# Patient Record
Sex: Female | Born: 1940 | Race: White | Hispanic: No | Smoking: Current some day smoker
Health system: Southern US, Community
[De-identification: ages and names within clinical notes are randomized; demographics above are authoritative.]

## PROBLEM LIST (undated history)

## (undated) DIAGNOSIS — D649 Anemia, unspecified: Secondary | ICD-10-CM

## (undated) DIAGNOSIS — R112 Nausea with vomiting, unspecified: Secondary | ICD-10-CM

## (undated) DIAGNOSIS — J449 Chronic obstructive pulmonary disease, unspecified: Secondary | ICD-10-CM

## (undated) DIAGNOSIS — F32A Depression, unspecified: Secondary | ICD-10-CM

## (undated) DIAGNOSIS — Z951 Presence of aortocoronary bypass graft: Secondary | ICD-10-CM

## (undated) DIAGNOSIS — Z87891 Personal history of nicotine dependence: Secondary | ICD-10-CM

## (undated) DIAGNOSIS — E039 Hypothyroidism, unspecified: Secondary | ICD-10-CM

## (undated) DIAGNOSIS — R011 Cardiac murmur, unspecified: Secondary | ICD-10-CM

## (undated) DIAGNOSIS — K52839 Microscopic colitis, unspecified: Secondary | ICD-10-CM

## (undated) DIAGNOSIS — I10 Essential (primary) hypertension: Secondary | ICD-10-CM

## (undated) DIAGNOSIS — C801 Malignant (primary) neoplasm, unspecified: Secondary | ICD-10-CM

## (undated) DIAGNOSIS — S8290XA Unspecified fracture of unspecified lower leg, initial encounter for closed fracture: Secondary | ICD-10-CM

## (undated) DIAGNOSIS — Z9889 Other specified postprocedural states: Secondary | ICD-10-CM

## (undated) DIAGNOSIS — N2889 Other specified disorders of kidney and ureter: Secondary | ICD-10-CM

## (undated) DIAGNOSIS — I259 Chronic ischemic heart disease, unspecified: Secondary | ICD-10-CM

## (undated) DIAGNOSIS — I714 Abdominal aortic aneurysm, without rupture: Secondary | ICD-10-CM

## (undated) DIAGNOSIS — Z952 Presence of prosthetic heart valve: Secondary | ICD-10-CM

## (undated) DIAGNOSIS — I2510 Atherosclerotic heart disease of native coronary artery without angina pectoris: Secondary | ICD-10-CM

## (undated) DIAGNOSIS — E785 Hyperlipidemia, unspecified: Secondary | ICD-10-CM

## (undated) DIAGNOSIS — I739 Peripheral vascular disease, unspecified: Secondary | ICD-10-CM

## (undated) DIAGNOSIS — K219 Gastro-esophageal reflux disease without esophagitis: Secondary | ICD-10-CM

## (undated) DIAGNOSIS — K5790 Diverticulosis of intestine, part unspecified, without perforation or abscess without bleeding: Secondary | ICD-10-CM

## (undated) DIAGNOSIS — F329 Major depressive disorder, single episode, unspecified: Secondary | ICD-10-CM

## (undated) DIAGNOSIS — I509 Heart failure, unspecified: Secondary | ICD-10-CM

## (undated) DIAGNOSIS — E669 Obesity, unspecified: Secondary | ICD-10-CM

## (undated) DIAGNOSIS — K635 Polyp of colon: Secondary | ICD-10-CM

## (undated) HISTORY — DX: Hypothyroidism, unspecified: E03.9

## (undated) HISTORY — DX: Obesity, unspecified: E66.9

## (undated) HISTORY — DX: Essential (primary) hypertension: I10

## (undated) HISTORY — PX: CHOLECYSTECTOMY: SHX55

## (undated) HISTORY — DX: Gastro-esophageal reflux disease without esophagitis: K21.9

## (undated) HISTORY — DX: Microscopic colitis, unspecified: K52.839

## (undated) HISTORY — PX: EYE SURGERY: SHX253

## (undated) HISTORY — PX: CARPAL TUNNEL RELEASE: SHX101

## (undated) HISTORY — DX: Diverticulosis of intestine, part unspecified, without perforation or abscess without bleeding: K57.90

## (undated) HISTORY — DX: Personal history of nicotine dependence: Z87.891

## (undated) HISTORY — DX: Major depressive disorder, single episode, unspecified: F32.9

## (undated) HISTORY — DX: Depression, unspecified: F32.A

## (undated) HISTORY — DX: Abdominal aortic aneurysm, without rupture: I71.4

## (undated) HISTORY — DX: Unspecified fracture of unspecified lower leg, initial encounter for closed fracture: S82.90XA

## (undated) HISTORY — DX: Chronic ischemic heart disease, unspecified: I25.9

## (undated) HISTORY — DX: Polyp of colon: K63.5

## (undated) HISTORY — DX: Peripheral vascular disease, unspecified: I73.9

## (undated) HISTORY — DX: Hyperlipidemia, unspecified: E78.5

---

## 1983-01-11 DIAGNOSIS — Z951 Presence of aortocoronary bypass graft: Secondary | ICD-10-CM

## 1983-01-11 HISTORY — PX: CORONARY ARTERY BYPASS GRAFT: SHX141

## 1983-01-11 HISTORY — DX: Presence of aortocoronary bypass graft: Z95.1

## 1983-04-19 DIAGNOSIS — Z951 Presence of aortocoronary bypass graft: Secondary | ICD-10-CM | POA: Insufficient documentation

## 1989-01-10 HISTORY — PX: COLONOSCOPY: SHX174

## 1997-10-14 ENCOUNTER — Encounter (HOSPITAL_COMMUNITY): Admission: RE | Admit: 1997-10-14 | Discharge: 1998-01-12 | Payer: Self-pay | Admitting: *Deleted

## 1998-02-19 ENCOUNTER — Ambulatory Visit (HOSPITAL_COMMUNITY): Admission: RE | Admit: 1998-02-19 | Discharge: 1998-02-19 | Payer: Self-pay | Admitting: *Deleted

## 1998-03-04 ENCOUNTER — Other Ambulatory Visit: Admission: RE | Admit: 1998-03-04 | Discharge: 1998-03-04 | Payer: Self-pay | Admitting: *Deleted

## 1998-03-26 ENCOUNTER — Ambulatory Visit (HOSPITAL_COMMUNITY): Admission: RE | Admit: 1998-03-26 | Discharge: 1998-03-26 | Payer: Self-pay | Admitting: *Deleted

## 1998-04-01 ENCOUNTER — Encounter: Payer: Self-pay | Admitting: *Deleted

## 1998-04-01 ENCOUNTER — Ambulatory Visit (HOSPITAL_COMMUNITY): Admission: RE | Admit: 1998-04-01 | Discharge: 1998-04-01 | Payer: Self-pay | Admitting: *Deleted

## 1998-07-03 ENCOUNTER — Other Ambulatory Visit: Admission: RE | Admit: 1998-07-03 | Discharge: 1998-07-03 | Payer: Self-pay | Admitting: *Deleted

## 1998-12-16 ENCOUNTER — Other Ambulatory Visit: Admission: RE | Admit: 1998-12-16 | Discharge: 1998-12-16 | Payer: Self-pay | Admitting: *Deleted

## 1999-01-11 DIAGNOSIS — Z951 Presence of aortocoronary bypass graft: Secondary | ICD-10-CM

## 1999-01-11 HISTORY — DX: Presence of aortocoronary bypass graft: Z95.1

## 1999-02-15 ENCOUNTER — Encounter: Payer: Self-pay | Admitting: Cardiology

## 1999-02-15 ENCOUNTER — Ambulatory Visit (HOSPITAL_COMMUNITY): Admission: RE | Admit: 1999-02-15 | Discharge: 1999-02-15 | Payer: Self-pay | Admitting: Cardiology

## 1999-02-16 ENCOUNTER — Ambulatory Visit (HOSPITAL_COMMUNITY): Admission: RE | Admit: 1999-02-16 | Discharge: 1999-02-16 | Payer: Self-pay | Admitting: Cardiology

## 1999-02-16 HISTORY — PX: CARDIAC CATHETERIZATION: SHX172

## 1999-03-05 ENCOUNTER — Encounter: Payer: Self-pay | Admitting: Cardiothoracic Surgery

## 1999-03-09 ENCOUNTER — Inpatient Hospital Stay (HOSPITAL_COMMUNITY): Admission: RE | Admit: 1999-03-09 | Discharge: 1999-03-14 | Payer: Self-pay | Admitting: Cardiothoracic Surgery

## 1999-03-09 ENCOUNTER — Encounter (INDEPENDENT_AMBULATORY_CARE_PROVIDER_SITE_OTHER): Payer: Self-pay | Admitting: *Deleted

## 1999-03-09 ENCOUNTER — Encounter: Payer: Self-pay | Admitting: Cardiothoracic Surgery

## 1999-03-09 DIAGNOSIS — Z951 Presence of aortocoronary bypass graft: Secondary | ICD-10-CM | POA: Insufficient documentation

## 1999-03-09 HISTORY — PX: CORONARY ARTERY BYPASS GRAFT: SHX141

## 1999-03-10 ENCOUNTER — Encounter: Payer: Self-pay | Admitting: Cardiothoracic Surgery

## 1999-03-11 ENCOUNTER — Encounter: Payer: Self-pay | Admitting: Cardiothoracic Surgery

## 1999-03-12 ENCOUNTER — Encounter: Payer: Self-pay | Admitting: Cardiothoracic Surgery

## 1999-04-13 ENCOUNTER — Encounter (HOSPITAL_COMMUNITY): Admission: RE | Admit: 1999-04-13 | Discharge: 1999-07-12 | Payer: Self-pay | Admitting: Cardiology

## 1999-06-18 ENCOUNTER — Other Ambulatory Visit: Admission: RE | Admit: 1999-06-18 | Discharge: 1999-06-18 | Payer: Self-pay | Admitting: *Deleted

## 1999-10-19 ENCOUNTER — Encounter: Admission: RE | Admit: 1999-10-19 | Discharge: 1999-11-22 | Payer: Self-pay | Admitting: Orthopaedic Surgery

## 1999-12-15 ENCOUNTER — Other Ambulatory Visit: Admission: RE | Admit: 1999-12-15 | Discharge: 1999-12-15 | Payer: Self-pay | Admitting: *Deleted

## 2000-06-14 ENCOUNTER — Ambulatory Visit (HOSPITAL_COMMUNITY): Admission: RE | Admit: 2000-06-14 | Discharge: 2000-06-14 | Payer: Self-pay | Admitting: Orthopedic Surgery

## 2000-06-14 ENCOUNTER — Encounter: Payer: Self-pay | Admitting: Orthopedic Surgery

## 2000-12-20 ENCOUNTER — Other Ambulatory Visit: Admission: RE | Admit: 2000-12-20 | Discharge: 2000-12-20 | Payer: Self-pay | Admitting: *Deleted

## 2002-01-10 HISTORY — PX: GASTRIC BYPASS: SHX52

## 2002-03-14 ENCOUNTER — Other Ambulatory Visit: Admission: RE | Admit: 2002-03-14 | Discharge: 2002-03-14 | Payer: Self-pay | Admitting: *Deleted

## 2002-06-06 ENCOUNTER — Encounter: Admission: RE | Admit: 2002-06-06 | Discharge: 2002-09-04 | Payer: Self-pay | Admitting: *Deleted

## 2002-06-14 ENCOUNTER — Ambulatory Visit (HOSPITAL_COMMUNITY): Admission: RE | Admit: 2002-06-14 | Discharge: 2002-06-14 | Payer: Self-pay | Admitting: *Deleted

## 2002-06-14 ENCOUNTER — Encounter: Payer: Self-pay | Admitting: Cardiology

## 2002-06-21 ENCOUNTER — Encounter (INDEPENDENT_AMBULATORY_CARE_PROVIDER_SITE_OTHER): Payer: Self-pay | Admitting: *Deleted

## 2002-06-21 ENCOUNTER — Encounter: Admission: RE | Admit: 2002-06-21 | Discharge: 2002-06-21 | Payer: Self-pay | Admitting: *Deleted

## 2002-09-02 ENCOUNTER — Encounter: Payer: Self-pay | Admitting: Internal Medicine

## 2002-09-02 ENCOUNTER — Encounter (INDEPENDENT_AMBULATORY_CARE_PROVIDER_SITE_OTHER): Payer: Self-pay

## 2002-09-02 ENCOUNTER — Inpatient Hospital Stay (HOSPITAL_COMMUNITY): Admission: RE | Admit: 2002-09-02 | Discharge: 2002-09-04 | Payer: Self-pay | Admitting: *Deleted

## 2002-09-02 ENCOUNTER — Encounter (INDEPENDENT_AMBULATORY_CARE_PROVIDER_SITE_OTHER): Payer: Self-pay | Admitting: *Deleted

## 2002-09-03 ENCOUNTER — Encounter (INDEPENDENT_AMBULATORY_CARE_PROVIDER_SITE_OTHER): Payer: Self-pay | Admitting: *Deleted

## 2002-09-17 ENCOUNTER — Encounter: Admission: RE | Admit: 2002-09-17 | Discharge: 2002-12-16 | Payer: Self-pay | Admitting: *Deleted

## 2003-02-11 ENCOUNTER — Encounter: Admission: RE | Admit: 2003-02-11 | Discharge: 2003-05-12 | Payer: Self-pay | Admitting: *Deleted

## 2003-07-24 ENCOUNTER — Other Ambulatory Visit: Admission: RE | Admit: 2003-07-24 | Discharge: 2003-07-24 | Payer: Self-pay | Admitting: *Deleted

## 2004-06-21 ENCOUNTER — Encounter: Admission: RE | Admit: 2004-06-21 | Discharge: 2004-09-19 | Payer: Self-pay | Admitting: *Deleted

## 2004-09-17 ENCOUNTER — Other Ambulatory Visit: Admission: RE | Admit: 2004-09-17 | Discharge: 2004-09-17 | Payer: Self-pay | Admitting: *Deleted

## 2006-05-18 ENCOUNTER — Ambulatory Visit (HOSPITAL_COMMUNITY): Admission: RE | Admit: 2006-05-18 | Discharge: 2006-05-18 | Payer: Self-pay | Admitting: Cardiology

## 2006-10-16 ENCOUNTER — Encounter: Payer: Self-pay | Admitting: Internal Medicine

## 2006-10-16 ENCOUNTER — Ambulatory Visit (HOSPITAL_COMMUNITY): Admission: RE | Admit: 2006-10-16 | Discharge: 2006-10-16 | Payer: Self-pay | Admitting: Cardiology

## 2006-10-20 ENCOUNTER — Inpatient Hospital Stay (HOSPITAL_BASED_OUTPATIENT_CLINIC_OR_DEPARTMENT_OTHER): Admission: RE | Admit: 2006-10-20 | Discharge: 2006-10-20 | Payer: Self-pay | Admitting: Cardiology

## 2006-10-20 HISTORY — PX: CARDIAC CATHETERIZATION: SHX172

## 2006-11-14 ENCOUNTER — Encounter (HOSPITAL_COMMUNITY): Admission: RE | Admit: 2006-11-14 | Discharge: 2007-01-09 | Payer: Self-pay | Admitting: Cardiology

## 2007-01-11 ENCOUNTER — Encounter (HOSPITAL_COMMUNITY): Admission: RE | Admit: 2007-01-11 | Discharge: 2007-04-05 | Payer: Self-pay | Admitting: Cardiology

## 2007-04-11 ENCOUNTER — Encounter (HOSPITAL_COMMUNITY): Admission: RE | Admit: 2007-04-11 | Discharge: 2007-07-10 | Payer: Self-pay | Admitting: Cardiology

## 2007-05-16 ENCOUNTER — Other Ambulatory Visit: Admission: RE | Admit: 2007-05-16 | Discharge: 2007-05-16 | Payer: Self-pay | Admitting: Obstetrics and Gynecology

## 2007-06-01 ENCOUNTER — Ambulatory Visit (HOSPITAL_COMMUNITY): Admission: RE | Admit: 2007-06-01 | Discharge: 2007-06-01 | Payer: Self-pay | Admitting: Obstetrics & Gynecology

## 2007-06-20 ENCOUNTER — Encounter (INDEPENDENT_AMBULATORY_CARE_PROVIDER_SITE_OTHER): Payer: Self-pay | Admitting: *Deleted

## 2007-06-20 ENCOUNTER — Ambulatory Visit: Payer: Self-pay | Admitting: Internal Medicine

## 2007-07-09 ENCOUNTER — Ambulatory Visit (HOSPITAL_COMMUNITY): Admission: RE | Admit: 2007-07-09 | Discharge: 2007-07-09 | Payer: Self-pay | Admitting: Cardiology

## 2007-07-11 ENCOUNTER — Encounter (HOSPITAL_COMMUNITY): Admission: RE | Admit: 2007-07-11 | Discharge: 2007-10-09 | Payer: Self-pay | Admitting: Cardiology

## 2007-07-12 ENCOUNTER — Ambulatory Visit: Payer: Self-pay | Admitting: Internal Medicine

## 2007-07-12 HISTORY — PX: COLONOSCOPY: SHX174

## 2007-08-22 ENCOUNTER — Ambulatory Visit (HOSPITAL_COMMUNITY): Admission: RE | Admit: 2007-08-22 | Discharge: 2007-08-22 | Payer: Self-pay | Admitting: Internal Medicine

## 2007-10-11 ENCOUNTER — Encounter (HOSPITAL_COMMUNITY): Admission: RE | Admit: 2007-10-11 | Discharge: 2008-01-09 | Payer: Self-pay | Admitting: Cardiology

## 2007-10-24 ENCOUNTER — Emergency Department (HOSPITAL_COMMUNITY): Admission: EM | Admit: 2007-10-24 | Discharge: 2007-10-24 | Payer: Self-pay | Admitting: Family Medicine

## 2007-11-07 ENCOUNTER — Telehealth: Payer: Self-pay | Admitting: Internal Medicine

## 2007-12-11 ENCOUNTER — Telehealth: Payer: Self-pay | Admitting: Internal Medicine

## 2007-12-12 DIAGNOSIS — R1013 Epigastric pain: Secondary | ICD-10-CM

## 2007-12-12 DIAGNOSIS — R110 Nausea: Secondary | ICD-10-CM

## 2007-12-17 ENCOUNTER — Ambulatory Visit (HOSPITAL_COMMUNITY): Admission: RE | Admit: 2007-12-17 | Discharge: 2007-12-17 | Payer: Self-pay | Admitting: Internal Medicine

## 2007-12-26 ENCOUNTER — Telehealth: Payer: Self-pay | Admitting: Internal Medicine

## 2007-12-28 ENCOUNTER — Other Ambulatory Visit: Payer: Self-pay | Admitting: Internal Medicine

## 2007-12-28 ENCOUNTER — Ambulatory Visit (HOSPITAL_COMMUNITY): Admission: RE | Admit: 2007-12-28 | Discharge: 2007-12-28 | Payer: Self-pay | Admitting: Cardiology

## 2007-12-28 ENCOUNTER — Encounter: Payer: Self-pay | Admitting: Internal Medicine

## 2007-12-28 DIAGNOSIS — I714 Abdominal aortic aneurysm, without rupture: Secondary | ICD-10-CM

## 2007-12-28 DIAGNOSIS — Z862 Personal history of diseases of the blood and blood-forming organs and certain disorders involving the immune mechanism: Secondary | ICD-10-CM | POA: Insufficient documentation

## 2007-12-28 DIAGNOSIS — K7689 Other specified diseases of liver: Secondary | ICD-10-CM | POA: Insufficient documentation

## 2007-12-28 DIAGNOSIS — K5730 Diverticulosis of large intestine without perforation or abscess without bleeding: Secondary | ICD-10-CM | POA: Insufficient documentation

## 2007-12-31 ENCOUNTER — Ambulatory Visit: Payer: Self-pay | Admitting: Internal Medicine

## 2008-01-11 ENCOUNTER — Encounter (HOSPITAL_COMMUNITY): Admission: RE | Admit: 2008-01-11 | Discharge: 2008-04-10 | Payer: Self-pay | Admitting: Cardiology

## 2008-04-11 ENCOUNTER — Encounter (HOSPITAL_COMMUNITY): Admission: RE | Admit: 2008-04-11 | Discharge: 2008-07-10 | Payer: Self-pay | Admitting: Cardiology

## 2009-01-28 ENCOUNTER — Ambulatory Visit (HOSPITAL_COMMUNITY): Admission: RE | Admit: 2009-01-28 | Discharge: 2009-01-28 | Payer: Self-pay | Admitting: Cardiology

## 2009-02-03 ENCOUNTER — Encounter: Admission: RE | Admit: 2009-02-03 | Discharge: 2009-02-03 | Payer: Self-pay | Admitting: Cardiology

## 2009-08-31 ENCOUNTER — Ambulatory Visit: Payer: Self-pay | Admitting: Cardiology

## 2010-02-10 ENCOUNTER — Other Ambulatory Visit: Payer: Self-pay | Admitting: Cardiology

## 2010-02-10 DIAGNOSIS — I714 Abdominal aortic aneurysm, without rupture, unspecified: Secondary | ICD-10-CM

## 2010-02-10 HISTORY — DX: Abdominal aortic aneurysm, without rupture, unspecified: I71.40

## 2010-02-10 HISTORY — DX: Abdominal aortic aneurysm, without rupture: I71.4

## 2010-02-19 ENCOUNTER — Ambulatory Visit
Admission: RE | Admit: 2010-02-19 | Discharge: 2010-02-19 | Disposition: A | Discharge status: Home / Self Care | Payer: MEDICARE | Source: Ambulatory Visit | Attending: Cardiology | Admitting: Cardiology

## 2010-02-19 DIAGNOSIS — I714 Abdominal aortic aneurysm, without rupture: Secondary | ICD-10-CM

## 2010-03-05 ENCOUNTER — Ambulatory Visit (INDEPENDENT_AMBULATORY_CARE_PROVIDER_SITE_OTHER): Payer: MEDICARE | Admitting: Cardiology

## 2010-03-05 DIAGNOSIS — E7800 Pure hypercholesterolemia, unspecified: Secondary | ICD-10-CM

## 2010-03-05 DIAGNOSIS — I2510 Atherosclerotic heart disease of native coronary artery without angina pectoris: Secondary | ICD-10-CM

## 2010-05-25 NOTE — H&P (Signed)
NAMECAMILIA, [name]                 ACCOUNT NO.:  [id number]   MEDICAL RECORD NO.:  [id number]          PATIENT TYPE:  OUT   LOCATION:  MLAB                         FACILITY:  [facility]   PHYSICIAN:  Colleen Can. Deborah Chalk, M.D.DATE OF BIRTH:  1940/05/19   DATE OF ADMISSION:  10/16/2006  DATE OF DISCHARGE:                              HISTORY & PHYSICAL   CHIEF COMPLAINT:  None.   HISTORY OF PRESENT ILLNESS:  [name] is a 70 year old white female who is  referred for diagnostic cardiac catheterization.  She was seen in our  office towards the middle part of September after an approximate 2-year  absence.  In that timeframe, she has basically done well.  She has had  no recurrence of chest pain.  Unfortunately, she has gained back a  considerable amount of weight and is not exercising.  She has been very  depressed and stressed out about multiple issues in her life.  She was  subsequently referred for outpatient Cardiolite study which was  performed on October 10, 2006.  She underwent a two-day adenosine  Cardiolite study which demonstrated anterior ischemia with normal LV  function.  Her ejection fraction was 58%.  She is now referred for  diagnostic cardiac catheterization.   PAST MEDICAL HISTORY:  1. Known ischemic heart disease.  She had original coronary artery      bypass grafting in 1985 with left internal mammary to the LAD, vein      graft sequentially to the obtuse marginals 1 and 2, and a vein      graft to the right coronary per Dr. Particia Lather.  She underwent      a redo coronary artery bypass grafting in February of 2001 per Dr.      Sheliah Plane with a free right internal mammary to the distal      LAD, sequential vein graft to the first and second OMs.  2. Obesity.  3. Previous history of bariatric surgery.  4. History of fractured right leg.  5. History of carpal tunnel surgery.  6. Hypertension.  7. Hyperlipidemia.  She is felt to be statin intolerant due to a  past      history of increased LFTs.  8. Depression.   ALLERGIES:  NONE.   CURRENT MEDICATIONS:  1. Celexa 40 mg a day.  2. Synthroid 50 mcg a day.  3. Atenolol 50 mg a day.  4. Aspirin daily.  5. Zetia 10 mg a day.  6. Fish oil daily.  7. Vitamin D daily.   FAMILY HISTORY:  Unremarkable for CAD.   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  She has no alcohol or tobacco use.   REVIEW OF SYSTEMS:  As noted above and is otherwise unremarkable.   PHYSICAL EXAMINATION:  GENERAL:  She is a very pleasant white female.  She is in no acute distress.  VITAL SIGNS:  Her weight is 244.5 pounds, blood pressure 138/80 sitting,  128/80 standing, heart rate 60 and regular, respirations 18, afebrile.  SKIN:  Warm and dry.  Color is unremarkable.  LUNGS:  Clear.  HEART:  Regular rhythm.  ABDOMEN:  Obese.  EXTREMITIES:  Without edema.  NEUROLOGIC:  No gross focal deficits.   PERTINENT LABORATORY DATA:  Pending.   IMPRESSION:  1. Abnormal adenosine Cardiolite study.  2. Known history of ischemic heart disease with original coronary      artery bypass grafting in 1985 and subsequent redo surgery in 2001.  3. Obesity.  4. Depression.  5. Hypothyroidism.  6. History of bariatric surgery.  7. Hyperlipidemia, felt to be statin intolerant.   PLAN:  Will proceed on with diagnostic cardiac catheterization.  The  procedure risks and benefits have all been explained, and she is willing  to proceed on Friday, October 20, 2006.      Sharlee Blew, N.P.      Colleen Can. Deborah Chalk, M.D.  Electronically Signed    LC/MEDQ  D:  10/16/2006  T:  10/16/2006  Job:  [id number]   cc:   Colleen Can. Deborah Chalk, M.D.

## 2010-05-25 NOTE — Cardiovascular Report (Signed)
NAMECATTIE, [name]                 ACCOUNT NO.:  [id number]   MEDICAL RECORD NO.:  [id number]          PATIENT TYPE:  OIB   LOCATION:  1961                         FACILITY:  [facility]   PHYSICIAN:  Colleen Can. Deborah Chalk, M.D.DATE OF BIRTH:  1940-06-06   DATE OF PROCEDURE:  10/20/2006  DATE OF DISCHARGE:                            CARDIAC CATHETERIZATION   PROCEDURE:  Left heart catheterization with left coronary angiography,  left ventricular angiography, angiography of the right internal mammary  artery free graft, angiography of the saphenous vein graft, angiography  of the left internal mammary graft.   TYPE AND SITE OF ENTRY:  Percutaneous right femoral artery.   CATHETERS:  4-French 4 curved catheters, 4-French JL-4 left coronary  catheter, 4-French pigtail ventriculographic catheter, Williams right  catheter, left internal mammary graft catheter, and a right 3D catheter  to engage the left subclavian.   CONTRAST MATERIAL:  Omnipaque.   COMMENTS:  The patient tolerated the procedure well.   HEMODYNAMIC DATA:  The aortic pressure is 140/72, LV was 157/12-20.  There was no aortic valve gradient noted on pullback.   ANGIOGRAPHIC DATA:  Left ventricular angiogram was performed in the RAO  position.  Overall cardiac size and silhouette were essentially normal.  There is mild inferior hypokinesis but overall the inferior wall and  anterior apical wall moved reasonably well.  The global ejection  fraction would be in the 55% range.  There was no mitral regurgitation,  intracardiac calcification or intracavitary filling defect.   CORONARY ARTERIES.:  1. Left internal mammary graft to LAD is patent.  There is flow to a      small bifurcating diagonal system proximally.  There is a total      occlusion of the left anterior descending distally.  2. The diagonal vessel is small.  3. Left main coronary artery is proximally occluded.  4. Saphenous vein graft to obtuse marginal one and  obtuse marginal two      is widely patent with excellent flow.  These are reasonably large      vessels and give collaterals to the inferior wall.  5. Right coronary artery:  The right coronary artery is totally      occluded proximally.  There are collaterals by way of the conus      branch to the left system.  6. The right internal mammary graft to the distal LAD is widely      patent.  It has excellent insertion, good distal runoff.  There are      numerous collaterals to the right distribution, to the anterior RV      branches, and what appears to be septal branches as well.  7. The native right coronary artery is totally occluded.   OVERALL IMPRESSION:  1. Totally occluded native coronary circulation.  2. Persistent patency of the right internal mammary graft and the      distal left anterior descending artery with persistent patency of      the left internal mammary graft to the proximal left anterior      descending artery with persistent patency  of the saphenous vein      graft to the sequential branches of the obtuse marginal.   DISCUSSION:  While [name] has extensive coronary atherosclerosis, it  appears that she does have satisfactory revascularization at this point  in time.  Will continue medical management.  There are no lesions  present that would be amenable to angioplasty or redo surgery at this  point time.      Colleen Can. Deborah Chalk, M.D.  Electronically Signed     SNT/MEDQ  D:  10/20/2006  T:  10/20/2006  Job:  [id number]

## 2010-05-28 NOTE — Op Note (Signed)
   [name], [name]                           ACCOUNT NO.:  [id number]   MEDICAL RECORD NO.:  [id number]                   PATIENT TYPE:  INP   LOCATION:  X001                                 FACILITY:  [facility]   PHYSICIAN:  Sharlet Salina T. Hoxworth, M.D.          DATE OF BIRTH:  1940-08-18   DATE OF PROCEDURE:  09/02/2002  DATE OF DISCHARGE:                                 OPERATIVE REPORT   PROCEDURE:  Intraoperative upper endoscopy.   DESCRIPTION OF PROCEDURE:  Upper endoscopy is performed in this patient at  the completion of roux-Y gastric bypass. The video endoscope was passed  through the esophagus and then advanced under direct vision to the EG  junction and the small gastric pouch entered. The anastomosis was inspected  and appeared patent and intact. Photos were taken. There was no bleeding  from the staple lines. The pouch was inflated and suture lines were  inspected under saline in the abdomen and there was no leak. The pouch was  measured at approximately 4 cm in length. Following this, air was aspirated  and the scope was withdrawn.                                               Lorne Skeens. Hoxworth, M.D.    [name]  D:  09/02/2002  T:  09/02/2002  Job:  [id number]

## 2010-05-28 NOTE — Cardiovascular Report (Signed)
[place]  Patient:    ARIONNE IAMS                         MRN: [id number] Proc. Date: 02/16/99 Adm. Date:  [id number] Attending:  Eleanora Neighbor                        Cardiac Catheterization  PROCEDURES PERFORMED:  Left heart catheterization with selective coronary angiography, left ventricular angiography (RAO and LAO views), angiography of left internal mammary artery, and angiography of saphenous vein grafts.  TYPE AND SITE OF ENTRY:  Percutaneous right femoral artery.  CATHETERS:  Six [place] #4 curved Judkins right and left coronary catheters, 6-French pigtail ventriculography catheter.  CONTRAST MATERIAL:  Omnipaque.  MEDICATIONS GIVEN PRIOR TO PROCEDURE:  Valium 10 mg p.o.  MEDICATIONS GIVEN DURING PROCEDURE:  Versed 5 mg IV.  COMMENTS: Perclose was used.  The patient tolerated the procedure well.  RESULTS:  HEMODYNAMIC DATA:  The aortic pressure was 127/69.  LV pressure was 127/19. There was no aortic valve gradient noted on pullback.  ANGIOGRAPHIC DATA:  LEFT VENTRICULOGRAPHY:  Left ventricular angiogram was performed in the RAO and LAO projections.  There was a rather discrete area of inferolateral hypokinesis. The global ejection fraction would be 57%.  This area most likely corresponds to the distal left circumflex distribution.  There was no mitral regurgitation, intracardiac calcification, or intracavitary filling defect.  CORONARY ARTERIES:  The coronary arteries arise and distribute normally.  1. The left main coronary artery is essentially normal.  2. The left circumflex is totally occluded approximately 2 cm from its origin.   3. The left anterior descending artery develops a tubular 70% narrowing before  large septal perforating branch.  The LAD is occluded after the septal    perforator.  There is 70% to 80% narrowing at this junction of the first    septal perforator.  The first septal perforator  supplies collaterals to the    distal right coronary artery bed on the inferior wall.  The first septal    perforator is relatively large.  4. The right coronary artery is totally occluded approximately 3 cm from its    origin.  There is an atrial circumflex collateral that supplies collaterals o    the distal left circumflex bed.  SAPHENOUS VEIN GRAFTS: 1. The saphenous vein graft to the circumflex distribution (OM-1 and OM-2) has  30% to 40% narrowing in the body of the graft in the focal place before its    insertion into the first obtuse marginal.  This insertion site is satisfactory    and there is good flow into the first obtuse marginal and retrograde flow into    a second obtuse marginal.  The limb of the graft continues onto the distal    posterolateral branch and appears to have some degree of thrombus and grudge  in it.  There is some faint antegrade fill to the distal marginal systems.    There is some slight retrograde filling of this distal marginal system, but hat    appears to be the location of the ischemic event and probably created the recent    change in symptoms.  2. The saphenous vein graft to the distal right coronary artery was looked for    extensively and is presumed to have occluded with a small nub demonstrated that    the catheter would hang-up  in.  3. The left internal mammary artery to the LAD is widely patent, with nice body to    the graft and nice insertion.  The graft gives retrograde fill to a diagonal    and also supplies collaterals to the distal right coronary artery bed. There    appears to be a 90% focal stenosis after an area of segmental disease and after    the insertion into the LAD.  This area is potentially an area that could be    treated with angioplasty, although it would be complicated.  In view of the    cines from eight years ago, there has not been a dramatic change in this area of    stenosis.  There is diffuse distal  disease in the LAD beyond this point.  IMPRESSIONS: 1. Reasonably well preserved global left ventricular function with inferolateral    hypokinesis. 2. Totally occluded native circulation. 3. Totally occluded saphenous vein graft to the right coronary artery and an    occluded sequential limb of the saphenous vein graft to the distal left    circumflex with patent proximal limb of the sequential graft to the circumflex    system and patent left internal mammary artery. 4. Extensive and diffuse collateral system with a residual severe stenosis in the    left anterior descending artery distal to the insertion of the left internal    mammary artery graft.  DISCUSSION:  Initially, we will try to treat Jazira with medication.  We will obtain a surgical consultation, as well as a consultation for consideration of possible distal angioplasty.  However, with the extent of distal disease, it is felt that that needs to be considered with caution. DD:  02/16/99 TD:  02/16/99 Job: [id number]

## 2010-05-28 NOTE — H&P (Signed)
[place]  Patient:    [name], [name]                        MRN: [id number] Adm. Date:  [id number] Disc. Date: [id number] Attending:  Eleanora Neighbor Dictator:   Jennet Maduro. Earl Gala, R.N., A.N.P. CC:         Colleen Can. Deborah Chalk, M.D.                         History and Physical  CHIEF COMPLAINT:  Chest pain.  HISTORY OF PRESENT ILLNESS:  [name] is a 70 year old white female who has known atherosclerotic cardiovascular disease.  She has had previous coronary artery bypass grafting dating back to 1985; her last catheterization occurred in December of 1993, at which time she demonstrated normal left ventricular function with patent saphenous vein grafts x 3, with patent left internal mammary artery. There was a totally occluded native coronary artery x 3 with progression of small vessel and distal disease.  She presented to our office on February 09, 1999 as a work-in appointment with recurrent episodes of chest discomfort; it had been occurring or the past one to two weeks prior.  She notes that she had recurrent left arm discomfort as well as mid-substernal chest discomfort that had been somewhat exertional in nature, yet at times, she had had left arm discomfort noted with movement of the left arm as well as with actual palpation of the left arm.  She  tried some nitroglycerin over the previous weekend; however, it was 70 years old and she basically had no relief.  Her symptoms do resolve with rest.  We proceeded one with adenosine Cardiolite, two-day study, last week, which is abnormal. Ejection fraction is in the mid-50s and in light of these findings, we will now  proceed on with repeat cardiac catheterization.  PAST MEDICAL HISTORY: 1. ASCVD with a history of coronary artery bypass grafting in 1985 with left    internal mammary to the LAD, saphenous vein graft sequential to the obtuse    marginals #1 and #2 and a saphenous vein  graft to the right coronary per    Dr. Delsa Grana. Wilson, with last catheterization in 1993 with results as    described above. 2. Obesity. 3. Prior history of a fractured right leg. 4. History of carpal tunnel surgery. 5. Hypertension. 6. Hypercholesterolemia, currently on Lipitor.  ALLERGIES:  None known.  CURRENT MEDICINES: 1. Lipitor 20 mg a day. 2. Celexa 20 mg a day. 3. Norvasc 5 mg a day. 4. Synthroid 0.05 mg daily. 5. Tenormin 50 mg daily. 6. Imdur 60 mg daily.  FAMILY HISTORY:  Unremarkable for CAD.  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  There is no smoking and no alcohol use.  REVIEW OF SYSTEMS:  Otherwise as noted above.  PHYSICAL EXAMINATION:  GENERAL:  She is a very pleasant female, currently in no acute distress.  VITAL SIGNS:  Blood pressure 130/90, sitting and standing.  Heart rate is 90 and regular.  SKIN:  Warm and dry.  Color is unremarkable.  NECK:  Supple.  There is no JVD.  LUNGS:  Clear to auscultation.  CARDIAC:  Regular rhythm.  ABDOMEN:  Obese.  She has soft positive bowel sounds.  EXTREMITIES:  Extremities are full yet there is no overt edema.  NEUROLOGIC:  Intact  with no gross focal deficits.  LABORATORY DATA:  EKG originally performed at the hospital shows normal sinus rhythm with nonspecific ST and T wave changes.  Other labs are currently pending as well as chest x-ray.  OVERALL IMPRESSION:  Recurrent chest pain in the setting of an abnormal two-day  adenosine Cardiolite, with known atherosclerotic cardiovascular disease and previous history of coronary artery bypass grafting 15 years ago.  PLAN:  In light of these findings, will proceed on with repeat coronary angiography.  The risks, procedure and benefits have been explained, which include the risks of bleeding, bruising, blood clot or allergy to the x-ray dye, as well as arrhythmia, stroke, heart  attack and even the possibility of death was discussed and the patient is willing to proceed. DD:  02/15/99 TD:  02/15/99 Job: [id number]

## 2010-05-28 NOTE — Op Note (Signed)
. [facility]  Patient:    [name], [name]                        MRN: [id number] Proc. Date: 03/09/99 Adm. Date:  [id number] Attending:  Waldo Laine CC:         Colleen Can. Deborah Chalk, M.D.                           Operative Report  PREOPERATIVE DIAGNOSIS:  Recurrent coronary occlusive disease.  POSTOPERATIVE DIAGNOSIS:  Recurrent coronary occlusive disease.  SURGICAL PROCEDURE:  Redo coronary artery bypass grafting x 3 with the free right internal mammary to the distal left anterior descending coronary artery, sequential reverse saphenous vein graft to the first and second obtuse marginal coronary arteries.  SURGEON:  Gwenith Daily. Tyrone Sage, M.D.  FIRST ASSISTANT:  Carlye Grippe.  BRIEF HISTORY:  The patient is a 70 year old female who had previously undergone coronary artery bypass grafting 18 years prior.  She had done well until recently when she began having increasing anginal pain including one prolonged episode involving a myocardial infarction of the lateral wall.  Because of persistent symptoms, the patient sought medical attention.  A cardiac catheterization was performed by Dr. Deborah Chalk which demonstrated significant three-vessel disease including total occlusion of the right coronary artery with a very small distal  vessel.  A vein graft to this area was occluded.  The patient had a patent vein  graft to the first obtuse marginal with a sequential limb of this graft to the second obtuse marginal that appeared to be freshly closed.  In addition, there s a high grade, greater than 80%, stenosis of the LAD distal to the insertion of a large patent mammary graft to the LAD.  This supplied two diagonal branches also. Overall ventricular function was preserved.  Because of the patients persistent  symptoms, redo coronary artery bypass grafting was recommended.  This was with knowledge that the previous operation 18 years  before, difficulty performing with performing the right graft was noted because of the small size of the vessel and also because of diffuse disease in the LAD.  DESCRIPTION OF PROCEDURE:  With Swan-Ganz and arterial line monitors in place, he patient underwent general endotracheal anesthesia.  Consideration for endovein harvesting was entertained, but neither in the right nor the left thigh could the vein be located at the knee.  For this reason, an incision was made in the left  groin, and a segment of left thigh vein was dissected down.  A mediastinotomy was performed with a saggital saw taking care not to injure underlying tissues. The right internal mammary artery was dissected down with some difficulty and with se of a ______ because of the patients position on the table, and dissection down f the mammary artery was difficult; however, a satisfactory mammary graft was obtained and taken completely down to use as a free graft.  The right ventricle and right atrium and ascending aorta were then dissected out to allow cannulation. The patient was systemically heparinized in the ascending aorta in the right atrium and cannulated in the aortic root.  Then a cardioplegic needle was introduced into he ascending aorta.  The patient was placed on cardiopulmonary bypass at 2.4 liters/minute/sq m, and the remainder of the left ventricle was dissected out. The mammary artery was identified and care was taken not to injure it.  With as  much as the heart dissected as possible, the patients body temperature was cooled to 30 degrees.  An aortic cross-clamp was applied.  Cold blood potassium cardioplegia, 500 cc, was administered initially antegrade and additional plegia was administered retrograde.  A bulldog clamp was placed on the patent left graft to the LAD with some difficulty; however, it was elevated.  The remainder of the posterior wall and lateral wall of the heart was  dissected out.  The previous graft was identified.  The first obtuse marginal coronary artery was opened, and then the second obtuse marginal coronary artery was opened.   Using a short natural wire on the vein graft, anastomosis was performed to the first obtuse marginal coronary  artery.  This vessel was diffusely diseased and admitted a 1.5-mm probe.  In a similar fashion, the distal extent of the same vein was then carried on to the second obtuse marginal where a distal anastomosis was performed with a running -0 Prolene.  Additional cold blood cardioplegia is administered down the vein graft. Attention was then turned to the LAD which was a very diffusely diseased vessel, and the mid portion of the vessel was opened.  There was diffuse plaque throughout the wall and in order to be able to perform anastomosis, an endarterectomy of the distal LAD was performed.  Using a running 8-0 Prolene, the free right internal  mammary graft was anastomosed to the left anterior descending coronary artery. The aortic cross-clamp was then removed with a total cross-clamp time of 85 minutes. The patient spontaneously converted to a sinus rhythm.  A partial occlusion clamp was placed on the ascending aorta.   A single-punch aortotomy was performed in he vein graft to the first and second obtuse marginal coronary arteries was carried out.  The right internal mammary graft was then anastomosed to the hood of the ld vein graft to the obtuse marginal grafts.  Air was evacuated from the grafts, and the partial occlusion clamp was removed.  The sites of anastomosis were inspected and were free of bleeding.  The patient was then ventilated and weaned from cardiopulmonary bypass.  On low-dose dopamine, she remained hemodynamically stable and was decannulated in the usual fashion.  Protamine sulfate was administered.  With the operative field hemostatic, two atrial and two ventricular  pacing wires were applied.  ______ was applied.  A right pleural tube and two mediastinal tubes were left in place.  The mediastinal fat was covered over the ascending aorta. The sternum was closed with #6 stainless steel wire.  The fascia was closed with  interrupted #0 Vicryl and running 3-0 Vicryl subcutaneous stitches for subcuticular stitching of skin edges.  Dry dressings were applied.  Sponge and needle count as reported as correct at the completion of the procedure.  The patient tolerated he procedure without obvious complications and was transferred to the surgical intensive care unit for further postoperative care. DD:  03/10/99 TD:  03/10/99 Job: [id number]

## 2010-05-28 NOTE — Op Note (Signed)
[name], [name]                           ACCOUNT NO.:  [id number]   MEDICAL RECORD NO.:  [id number]                   PATIENT TYPE:  INP   LOCATION:  0154                                 FACILITY:  [facility]   PHYSICIAN:  Vikki Ports, M.D.         DATE OF BIRTH:  11/13/1940   DATE OF PROCEDURE:  09/02/2002  DATE OF DISCHARGE:                                 OPERATIVE REPORT   PREOPERATIVE DIAGNOSES:  1. Morbid obesity.  2. Cholelithiasis.   POSTOPERATIVE DIAGNOSES:  1. Morbid obesity.  2. Cholelithiasis.   PROCEDURES:  1. Laparoscopic Roux-en-Y gastric bypass.  2. Laparoscopic cholecystectomy.   SURGEON:  Vikki Ports, M.D.   ASSISTANT:  Sharlet Salina T. Hoxworth, M.D.   ANESTHESIA:  General.   DESCRIPTION OF PROCEDURE:  The patient was taken to the operating room and  placed in a supine position.  After adequate general anesthesia was induced  using endotracheal tube, the abdomen was prepped and draped in the normal  sterile fashion.  Using an oblique left subcostal 1 cm incision and the  OptiView, a 12 mm trocar was placed in the abdomen.  This was accomplished  under direct visualization.  Pneumoperitoneum was obtained by using  continuous flow carbon dioxide to a pressure of 17 mmHg.  Under direct  visualization, two 12 mm ports were placed in the right upper quadrant, one  in the left mid abdomen, and an additional port and the 5 mm port in the  left abdomen.  The abdomen was inspected, and no gross abnormalities were  seen.  Ligament of Treitz was identified and 40 cm distal to the ligament,  the bowel was divided using a 60 mm autosuture white load linear cutter.  The distal limb was marked with a Penrose drain.  An additional 100 cm was  then counted distal to the division, and a side-to-side jejunojejunostomy  was accomplished using the harmonic scalpel for enterotomies and a 60 mm  white load autosuture linear cutter.  The defect was closed with  running 2-0  Vicryl sutures.  It was reinforced with Tisseel.  The mesentery which had  previously been divided with the harmonic scalpel, easily mobilized the Roux-  limb to reach the left lateral segment of the liver.   Nathanson retractor was placed in the upper abdomen, and the liver was  retracted.  There was a small capsular tear to the liver.  This was  controlled with Surgicel.  The angle of His was identified and was dissected  away from the left crus of the diaphragm.  The lesser sac was entered.  An  area on the lesser curve was then identified between the first and second  vessels, measuring about 5 cm distal to the esophagogastric junction.  After  all tubes were removed from the stomach, the lesser sac was easily entered  from the lesser curve.  The stomach was then divided to create  the gastric  pouch with four firings of the 60 mm autosuture blue load linear cutter.  Ewald tube was placed prior to the third and fourth firing to ensure patency  of the EG junction.  A side-to-side gastrojejunostomy was then performed  with a running posterior 2-0 Vicryl layer; gastrotomy and enterotomy were  made, again using a blue load 45 mm autosuture linear cutter.  Anastomosis  was made.  Defect was closed with running 2-0 Vicryl sutures, and anterior  layer was accomplished over the Ewald tube through the anastomosis with a  running 2-0 Vicryl suture.  The abdomen was then irrigated with saline.  Roux-limb was clamped with a 10 mm DeBakey forcep, and upper endoscopy was  performed by Sharlet Salina T. Hoxworth, M.D.  This showed no evidence of leaks  and good patency of the gastrojejunostomy.  The anastomosis was reinforced  with Tisseel.   I then placed an additional 5 mm port in the right abdomen.  The gallbladder  was identified and retracted cephalad.  Dissection at the neck of the  gallbladder easily visualized the cystic duct which was quite small.  A good  window was created  posterior to it.  Each juncture with the gallbladder and  common duct were verified.  It was triply clipped and divided as was the  cystic artery.  Gallbladder was taken off the gallbladder bed using Bovie  electrocautery and placed in an EndoCatch bag.  There was some bleeding from  the capsule, and this was again controlled with Surgicel.  Gallbladder was  then removed through one of the 12 mm ports.  Abdomen was then copiously  irrigated.  Of note, the jejunal mesenteric defect was closed with a running  2-0 silk suture.  Drain was placed at the gastrojejunostomy.  Pneumoperitoneum was released.  Trocars were removed.  Skin incisions were  closed with staples.  Sterile dressings were applied.  The patient tolerated  the procedure well and went to PACU in good condition.                                               Vikki Ports, M.D.    KRH/MEDQ  D:  09/02/2002  T:  09/02/2002  Job:  [id number]

## 2010-05-28 NOTE — Discharge Summary (Signed)
[place]  Patient:    [name], [name]                        MRN: [id number] Adm. Date:  [id number] Disc. Date: [id number] Attending:  Waldo Laine Dictator:   Carlye Grippe. CC:         Gwenith Daily. Tyrone Sage, M.D.             Colleen Can. Deborah Chalk, M.D.                           Discharge Summary  HISTORY OF PRESENT ILLNESS:  This is a very pleasant 70 year old white female who had previous coronary artery bypass grafting in 1985 by Dr. Andrey Campanile. Her last catheterization was in 1993 until the most recent admission. She presented to Dr. Angelina Pih office in late January 2001 because of recurrent episodes of chest discomfort associated with shortness of breath over the previous one to two weeks. She noted this discomfort was exertional in nature; but at times, she was short of breath with very minimal exertion. She tried nitroglycerin, but this was 67 years old and not effective. Adenosine Cardiolite stress test was performed by Colleen Can. Deborah Chalk, M.D. which was abnormal with ejection fraction in the mid 50 range. Because of the new-onset symptoms, known coronary artery occlusive disease, and positive Cardiolite test, repeat cardiac catheterization was performed on February 16, 1999. This demonstrated normal left main artery, totally occluded left circumflex, totally occluded LAD, and a large septal perforator. The right coronary artery was occluded with ______ collateral branches. The saphenous vein graft to the right coronary artery was occluded, and the saphenous vein graft to the circumflex which was a sequential obtuse marginal and obtuse marginal 2 graft had a 30 to 40% narrowing in the mid body of the graft. The limb of the graft continues on to the second OM. It is occluded with thrombus. A left internal mammary artery was patent. The insertion site looked quite adequate. Just past the insertion site, there was a 90% focal stenosis with  diffuse disease in the LAD. Overall, left ventricular function was relatively well preserved with inferior hypokinesis. Because of these findings, including increasing symptoms, positive Cardiolite stress test, graft failure, and three-vessel disease, redo coronary artery bypass grafting was recommended; and she was admitted this hospitalization for the procedure.  PAST MEDICAL HISTORY: 1. Coronary artery disease. 2. Obesity. 3. Right leg fracture 18 years ago. 4. Carpal tunnel surgery, right side. 5. History of hypertension. 6. History of hypercholesterolemia.  ALLERGIES:  No known drug allergies.  SOCIAL HISTORY, FAMILY HISTORY, REVIEW OF SYSTEMS, AND PHYSICAL EXAMINATION: Please see the history and physical done at the time of admission.  MEDICATIONS ON ADMISSION: 1. Levothroid 50 mcg one q.d. 2. Isosorbide 60 mg once q.d. 3. Norvasc 5 mg once q.d. 4. Celexa 40 mg q.d. 5. Atenolol 50 mg q.d. 6. Lipitor 20 mg q.d. 7. Aspirin one q.d.  HOSPITAL COURSE:  The patient was admitted electively on March 09, 1999. She underwent a redo coronary artery bypass grafting with the following grafts placed:  #1. Free right internal mammary artery to the LAD with an LAD endarterectomy. #2. Sequential saphenous vein graft to obtuse marginal 1 and obtuse marginal 2. Crossclamp time was 85 minutes. The patient tolerated the procedure well and was taken to the surgical intensive care unit in stable condition.  POSTOPERATIVE HOSPITAL COURSE:  The patient has done well. No significant postop bleeding. Hemodynamics have remained stable. She has remained free of dysrhythmias. She was weaned from the ventilator without difficulty. Routine lines and tubes were discontinued without difficulty. She was started on a course of diuresis and is responding well to Lasix. Incisions are healing well without signs of infection. Oxygen has been weaned, and she maintains good saturations on room air. She  has had some nausea, but this has resolved. She is tolerating diet. She is somewhat depressed at times but has been restarted back on her Celexa. Overall, she is felt to have had a good response from the surgery and is tentatively felt to be stable for discharge in the morning of March 14, 1999.  DISCHARGE MEDICATIONS: 1. Lasix 40 mg daily x 2 weeks. 2. K-Dur 20 mEq daily x 2 weeks. 3. Celexa 40 mg q.d. 4. Enteric-coated aspirin 325 mg q.d. 5. Lopressor 25 mg q.12h. 6. Tylox one or two q.4-6h. p.r.n. 7. Levothyroxine 50 mcg one q.d.  FOLLOW-UP:  Will include Edward B. Tyrone Sage, M.D. three weeks. Staples removed next week on Friday, March 19, 1999. The office will call with time. Additionally, she will follow up with Colleen Can. Deborah Chalk, M.D. in two weeks.  FINAL DIAGNOSES: 1. Recurrent coronary artery disease. 2. Obesity. 3. History of right leg fracture. 4. History of carpal tunnel surgery on the right. 5. History of hypertension. 6. Hypercholesterolemia.  CONDITION ON DISCHARGE:  Stable and improving.  INSTRUCTIONS:  The patient will be receive written instructions regarding medications, activity, diet, wound care, and follow-up. DD:  03/13/99 TD:  03/15/99 Job: [id number]

## 2010-06-04 ENCOUNTER — Other Ambulatory Visit: Payer: Self-pay | Admitting: Cardiology

## 2010-06-04 NOTE — Telephone Encounter (Signed)
Fax received from pharmacy. Refill completed. Jodette Carmell Elgin RN  

## 2010-08-09 ENCOUNTER — Other Ambulatory Visit: Payer: Self-pay | Admitting: *Deleted

## 2010-08-09 MED ORDER — ALPRAZOLAM 0.25 MG PO TABS: 0.2500 mg | ORAL_TABLET | Freq: Every evening | ORAL | Status: DC | PRN

## 2010-08-09 NOTE — Telephone Encounter (Signed)
Per Dr Swaziland, MD- ok to have refill for Xanax .25mg  1 -2 tabs PO QD #60 with 2 refills; this was phoned in to [facility]

## 2010-08-10 ENCOUNTER — Encounter: Payer: Self-pay | Admitting: *Deleted

## 2010-08-11 ENCOUNTER — Ambulatory Visit (INDEPENDENT_AMBULATORY_CARE_PROVIDER_SITE_OTHER): Payer: Medicare Other | Admitting: Nurse Practitioner

## 2010-08-11 ENCOUNTER — Ambulatory Visit (INDEPENDENT_AMBULATORY_CARE_PROVIDER_SITE_OTHER): Payer: Medicare Other | Admitting: *Deleted

## 2010-08-11 ENCOUNTER — Encounter: Payer: Self-pay | Admitting: Nurse Practitioner

## 2010-08-11 VITALS — BP 160/90 | HR 64 | Ht 67.0 in | Wt 216.0 lb

## 2010-08-11 DIAGNOSIS — I714 Abdominal aortic aneurysm, without rupture: Secondary | ICD-10-CM

## 2010-08-11 DIAGNOSIS — I10 Essential (primary) hypertension: Secondary | ICD-10-CM

## 2010-08-11 DIAGNOSIS — E7800 Pure hypercholesterolemia, unspecified: Secondary | ICD-10-CM

## 2010-08-11 DIAGNOSIS — F17200 Nicotine dependence, unspecified, uncomplicated: Secondary | ICD-10-CM

## 2010-08-11 DIAGNOSIS — I2510 Atherosclerotic heart disease of native coronary artery without angina pectoris: Secondary | ICD-10-CM

## 2010-08-11 DIAGNOSIS — E785 Hyperlipidemia, unspecified: Secondary | ICD-10-CM

## 2010-08-11 DIAGNOSIS — Z720 Tobacco use: Secondary | ICD-10-CM

## 2010-08-11 LAB — LIPID PANEL
HDL: 65.3 mg/dL (ref >39.00)
LDL Cholesterol: 63 mg/dL (ref 0–99)
Total CHOL/HDL Ratio: 2
Triglycerides: 93 mg/dL (ref 0.0–149.0)

## 2010-08-11 MED ORDER — VARENICLINE TARTRATE 0.5 MG PO TABS: 0.5000 mg | ORAL_TABLET | Freq: Two times a day (BID) | ORAL | Status: DC

## 2010-08-11 MED ORDER — HYDROCHLOROTHIAZIDE 25 MG PO TABS: 25.0000 mg | ORAL_TABLET | Freq: Every day | ORAL | Status: DC

## 2010-08-11 NOTE — Assessment & Plan Note (Signed)
She is interested in Chantix. I have sent a prescription to [facility]. She has been successful with this in the past.

## 2010-08-11 NOTE — Assessment & Plan Note (Addendum)
Blood pressure is up. I have started HCTZ 25 mg daily.She has been on this in the remote past and did well.  I will see her in about 3 weeks. She is to monitor at home.

## 2010-08-11 NOTE — Patient Instructions (Signed)
We will try you on some Chantix to help you stop smoking We are going to put you back on HCTZ 25 mg daily to get your blood pressure down I am going to see you in 3 weeks. Monitor your blood pressure at home. Call for any problems.

## 2010-08-11 NOTE — Progress Notes (Signed)
[name] Date of Birth: Oct 20, 1940   History of Present Illness: [name] is seen today for her 6 month visit. She is seen for Dr. Swaziland. She is a former patient of Dr. Ronnald Nian. She is back smoking. She is not having chest pain. She is not short of breath. She is not really exercising. She is under some stress at home with her daughter. Weight continues to be an issue. She is interested in Chantix.   Current Outpatient Prescriptions on File Prior to Visit  Medication Sig Dispense Refill  . ALPRAZolam (XANAX) 0.25 MG tablet Take 1 tablet (0.25 mg total) by mouth at bedtime as needed for sleep.  60 tablet  2  . aspirin 325 MG tablet Take 325 mg by mouth daily.        [name] atenolol (TENORMIN) 50 MG tablet Take 50 mg by mouth daily.        . Cholecalciferol (VITAMIN D) 2000 UNITS tablet Take 2,000 Units by mouth. Occasionally       . FLUoxetine (PROZAC) 40 MG capsule Take 40 mg by mouth daily.        [name] levothyroxine (SYNTHROID, LEVOTHROID) 50 MCG tablet Take 50 mcg by mouth daily.        . Omega-3 Fatty Acids (FISH OIL PO) Take 2 capsules by mouth daily.        . simvastatin (ZOCOR) 20 MG tablet TAKE ONE TABLET BY MOUTH ONE TIME DAILY  30 tablet  5    Allergies  Allergen Reactions  . Persantine     Past Medical History  Diagnosis Date  . Hyperlipidemia   . Hypertension   . Ischemic heart disease     had redo bypass surgery in 2001, at that time she had a free right internal mammary to the LAD, LAD endarterectomy, sequential vein graft to the OM1 and OM2 -- last catheterization was in 2008  . Hypothyroidism   . Obesity     with gastric bypass surgery in 2004  . History of tobacco abuse     Recurrent cigarette smoking  . Abdominal aortic aneurysm 2/12    infrarenal diagnosed with maximum measurement at 3.3 cm  . GERD (gastroesophageal reflux disease)   . Depression   . Leg fracture     remote right leg fracture    Past Surgical History  Procedure Date  . Gastric bypass  2004  . Carpal tunnel release   . Coronary artery bypass graft 1985  . Coronary artery bypass graft 03/09/1999    Redo CABG x3 --  with the free right internal mammary artey to the LAD with an LAD endarterectomy -- Sequential saphenous vein graft to OM1 and OM2                   . Cardiac catheterization 02/16/1999    Left heart catheterization with selective coronary  angiography, left ventricular angiography (RAO and LAO views), angiography of left internal mammary artery, and angiography of saphenous vein grafts  . Cardiac catheterization 10/20/2006    Est. EF of 55% -- Totally occuladed native coronary circulation -- Persistent patency of the right mammary graft and the distal LAD artery with peristent patency of the left mammary graft to the proximal LAD artery with persistant patency of the saphenous vein graft to the sequentail branches of the OM          History  Smoking status  . Current Everyday Smoker -- 0.5 packs/day  . Types: Cigarettes  .  Last Attempt to Quit: 01/10/1985  Smokeless tobacco  . Not on file    History  Alcohol Use No    Family History  Problem Relation Age of Onset  . Stroke Father   . Diabetes Mother   . Cancer Mother     pancreatic cancer    Review of Systems: The review of systems is as above.  All other systems were reviewed and are negative.  Physical Exam: BP 160/90  Pulse 64  Ht 5\' 7"  (1.702 m)  Wt 216 lb (97.977 kg)  BMI 33.83 kg/m2 BP is 160/90 by me as well. Patient is very pleasant and in no acute distress. She is obese. Skin is warm and dry. Color is normal.  HEENT is unremarkable. Normocephalic/atraumatic. PERRL. Sclera are nonicteric. Neck is supple. No masses. No JVD. Lungs are clear. Cardiac exam shows a regular rate and rhythm. Abdomen is soft. Extremities are without edema. Gait and ROM are intact. No gross neurologic deficits noted.  LABORATORY DATA: PENDING   Assessment / Plan:

## 2010-08-11 NOTE — Assessment & Plan Note (Signed)
Tried to be encouraging about making her health a priority. I am not sure she has the motivation at this time to make changes.

## 2010-08-11 NOTE — Assessment & Plan Note (Signed)
Last studied in February 2012.

## 2010-08-11 NOTE — Assessment & Plan Note (Signed)
She has had CABG and redo CABG in 2001. She is back smoking but interested in stopping. She is currently asymptomatic. She does not want stress testing. Her last nuclear was in 2008.

## 2010-08-12 LAB — COMPREHENSIVE METABOLIC PANEL
ALT: 14 U/L (ref 0–35)
AST: 19 U/L (ref 0–37)
Alkaline Phosphatase: 49 U/L (ref 39–117)
Creatinine, Ser: 0.8 mg/dL (ref 0.4–1.2)
Sodium: 141 mEq/L (ref 135–145)
Total Bilirubin: 0.7 mg/dL (ref 0.3–1.2)
Total Protein: 6.6 g/dL (ref 6.0–8.3)

## 2010-08-13 ENCOUNTER — Telehealth: Payer: Self-pay | Admitting: *Deleted

## 2010-08-13 ENCOUNTER — Other Ambulatory Visit: Payer: Self-pay | Admitting: Cardiology

## 2010-08-13 ENCOUNTER — Other Ambulatory Visit: Payer: Self-pay | Admitting: *Deleted

## 2010-08-13 MED ORDER — FLUOXETINE HCL 40 MG PO CAPS: 40.0000 mg | ORAL_CAPSULE | Freq: Every day | ORAL | Status: DC

## 2010-08-13 NOTE — Telephone Encounter (Signed)
Message copied by Lorayne Bender on Fri Aug 13, 2010  3:39 PM ------      Message from: [place], PETER M      Created: Fri Aug 13, 2010  2:02 PM       All labs are excellent!

## 2010-08-13 NOTE — Telephone Encounter (Signed)
REFUSED, SEND TO DR [name] HER PCP, DR TENNANT RETIRED.

## 2010-08-13 NOTE — Telephone Encounter (Signed)
Lm w/lab results. Will send copy to Dr. Cyndia Bent

## 2010-08-27 ENCOUNTER — Other Ambulatory Visit: Payer: Self-pay | Admitting: Cardiology

## 2010-08-27 NOTE — Telephone Encounter (Signed)
escribe medication per fax request  

## 2010-09-05 ENCOUNTER — Other Ambulatory Visit: Payer: Self-pay | Admitting: Cardiology

## 2010-09-10 ENCOUNTER — Ambulatory Visit (INDEPENDENT_AMBULATORY_CARE_PROVIDER_SITE_OTHER): Payer: Medicare Other | Admitting: Nurse Practitioner

## 2010-09-10 ENCOUNTER — Encounter: Payer: Self-pay | Admitting: Nurse Practitioner

## 2010-09-10 ENCOUNTER — Telehealth: Payer: Self-pay | Admitting: *Deleted

## 2010-09-10 VITALS — BP 120/80 | HR 56 | Ht 67.0 in | Wt 220.8 lb

## 2010-09-10 DIAGNOSIS — I2510 Atherosclerotic heart disease of native coronary artery without angina pectoris: Secondary | ICD-10-CM

## 2010-09-10 DIAGNOSIS — I10 Essential (primary) hypertension: Secondary | ICD-10-CM

## 2010-09-10 DIAGNOSIS — Z720 Tobacco use: Secondary | ICD-10-CM

## 2010-09-10 DIAGNOSIS — F17200 Nicotine dependence, unspecified, uncomplicated: Secondary | ICD-10-CM

## 2010-09-10 DIAGNOSIS — R011 Cardiac murmur, unspecified: Secondary | ICD-10-CM

## 2010-09-10 DIAGNOSIS — E875 Hyperkalemia: Secondary | ICD-10-CM

## 2010-09-10 DIAGNOSIS — I350 Nonrheumatic aortic (valve) stenosis: Secondary | ICD-10-CM | POA: Insufficient documentation

## 2010-09-10 DIAGNOSIS — I714 Abdominal aortic aneurysm, without rupture: Secondary | ICD-10-CM

## 2010-09-10 LAB — BASIC METABOLIC PANEL
BUN: 16 mg/dL (ref 6–23)
CO2: 30 mEq/L (ref 19–32)
Calcium: 9.6 mg/dL (ref 8.4–10.5)
Chloride: 109 mEq/L (ref 96–112)
Creatinine, Ser: 0.8 mg/dL (ref 0.4–1.2)
GFR: 73.68 mL/min (ref >60.00)
Glucose, Bld: 106 mg/dL — ABNORMAL HIGH (ref 70–99)
Potassium: 5.7 mEq/L — ABNORMAL HIGH (ref 3.5–5.1)
Sodium: 145 mEq/L (ref 135–145)

## 2010-09-10 NOTE — Assessment & Plan Note (Signed)
She did not tolerate Chantix. She will try the gum and patches. Cessation is strongly encouraged.

## 2010-09-10 NOTE — Assessment & Plan Note (Signed)
Blood pressure is better. Will continue the HCTZ. BMET is checked today.

## 2010-09-10 NOTE — Telephone Encounter (Signed)
msg left of lab app. Date and time for hyperkalemia. msg left with food items high in k+ to avoid. Alfonso Ramus RN

## 2010-09-10 NOTE — Progress Notes (Signed)
[name] Date of Birth: [birth year]   History of Present Illness: [name] is seen back today for a 3 week check. She is seen for [name]. She is a former patient of [name]. She has known CAD with redo CABG. She is back smoking. Blood pressure was up at her last visit and I started her on HCTZ. She is doing well with that. Blood pressure has come down nicely. No cardiac complaints. She did try the Chantix but had to stop due to very bad thoughts. She is trying the gum.  Current Outpatient Prescriptions on File Prior to Visit  Medication Sig Dispense Refill  . aspirin 325 MG tablet Take 325 mg by mouth daily.        [name] atenolol (TENORMIN) 50 MG tablet TAKE  ONE TABLET BY MOUTH NIGHTLY AT BEDTIME  30 tablet  4  . Cholecalciferol (VITAMIN D) 2000 UNITS tablet Take 2,000 Units by mouth. Occasionally       . FLUoxetine (PROZAC) 40 MG capsule Take 1 capsule (40 mg total) by mouth daily.  90 capsule  3  . hydrochlorothiazide 25 MG tablet Take 1 tablet (25 mg total) by mouth daily.  30 tablet  6  . levothyroxine (SYNTHROID, LEVOTHROID) 50 MCG tablet Take 50 mcg by mouth daily.        . Omega-3 Fatty Acids (FISH OIL PO) Take 2 capsules by mouth daily.        . simvastatin (ZOCOR) 20 MG tablet TAKE ONE TABLET BY MOUTH ONE TIME DAILY  30 tablet  5  . ALPRAZolam (XANAX) 0.25 MG tablet Take 1 tablet (0.25 mg total) by mouth at bedtime as needed for sleep.  60 tablet  2    Allergies  Allergen Reactions  . Persantine     Past Medical History  Diagnosis Date  . Hyperlipidemia   . Hypertension   . Ischemic heart disease     had redo bypass surgery in 2001, at that time she had a free right internal mammary to the LAD, LAD endarterectomy, sequential vein graft to the OM1 and OM2 -- last catheterization was in 2008  . Hypothyroidism   . Obesity     with gastric bypass surgery in 2004  . History of tobacco abuse     Recurrent cigarette smoking  . Abdominal aortic aneurysm 2/12   infrarenal diagnosed with maximum measurement at 3.3 cm  . GERD (gastroesophageal reflux disease)   . Depression   . Leg fracture     remote right leg fracture    Past Surgical History  Procedure Date  . Gastric bypass 2004  . Carpal tunnel release   . Coronary artery bypass graft 1985  . Coronary artery bypass graft 03/09/1999    Redo CABG x3 --  with the free right internal mammary artey to the LAD with an LAD endarterectomy -- Sequential saphenous vein graft to OM1 and OM2                   . Cardiac catheterization 02/16/1999    Left heart catheterization with selective coronary  angiography, left ventricular angiography (RAO and LAO views), angiography of left internal mammary artery, and angiography of saphenous vein grafts  . Cardiac catheterization 10/20/2006    Est. EF of 55% -- Totally occuladed native coronary circulation -- Persistent patency of the right mammary graft and the distal LAD artery with peristent patency of the left mammary graft to the proximal LAD artery with  persistant patency of the saphenous vein graft to the sequentail branches of the OM          History  Smoking status  . Current Everyday Smoker -- 0.5 packs/day  . Types: Cigarettes  . Last Attempt to Quit: 01/10/1985  Smokeless tobacco  . Not on file    History  Alcohol Use No    Family History  Problem Relation Age of Onset  . Stroke Father   . Diabetes Mother   . Cancer Mother     pancreatic cancer    Review of Systems: The review of systems is as above.  All other systems were reviewed and are negative.  Physical Exam: BP 120/80  Pulse 56  Ht 5\' 7"  (1.702 m)  Wt 220 lb 12.8 oz (100.154 kg)  BMI 34.58 kg/m2  SpO2 96% Patient is very pleasant and in no acute distress. She is obese. Skin is warm and dry. Color is normal.  HEENT is unremarkable. Normocephalic/atraumatic. PERRL. Sclera are nonicteric. Neck is supple. No masses. No JVD. Lungs are clear. Cardiac exam shows a regular rate  and rhythm. She has a soft outflow murmur noted today. Abdomen is obese but soft. Extremities are without edema. Gait and ROM are intact. No gross neurologic deficits noted.   LABORATORY DATA:   Assessment / Plan:

## 2010-09-10 NOTE — Assessment & Plan Note (Signed)
No symptoms at the present time. She was last cathed in 2008. She is managed medically.

## 2010-09-10 NOTE — Assessment & Plan Note (Signed)
Measures 3.3 cm. Will need to recheck in Feb 2013.

## 2010-09-10 NOTE — Patient Instructions (Signed)
Stay on your current medicines We are going to check an ultrasound of your heart to evaluate your murmur I will have you see Dr. Swaziland in 6 months Smoking cessation is encouraged.

## 2010-09-10 NOTE — Assessment & Plan Note (Signed)
She has an outflow murmur. No echo on chart. Echo will be arranged. She will see Dr. Swaziland in 6 months. Patient is agreeable to this plan and will call if any problems develop in the interim.

## 2010-09-20 ENCOUNTER — Ambulatory Visit (HOSPITAL_COMMUNITY): Payer: Medicare Other | Attending: Cardiology | Admitting: Radiology

## 2010-09-20 DIAGNOSIS — F17200 Nicotine dependence, unspecified, uncomplicated: Secondary | ICD-10-CM | POA: Insufficient documentation

## 2010-09-20 DIAGNOSIS — I10 Essential (primary) hypertension: Secondary | ICD-10-CM | POA: Insufficient documentation

## 2010-09-20 DIAGNOSIS — I2510 Atherosclerotic heart disease of native coronary artery without angina pectoris: Secondary | ICD-10-CM | POA: Insufficient documentation

## 2010-09-20 DIAGNOSIS — R011 Cardiac murmur, unspecified: Secondary | ICD-10-CM

## 2010-09-20 DIAGNOSIS — I079 Rheumatic tricuspid valve disease, unspecified: Secondary | ICD-10-CM | POA: Insufficient documentation

## 2010-09-22 ENCOUNTER — Telehealth: Payer: Self-pay | Admitting: *Deleted

## 2010-09-22 NOTE — Telephone Encounter (Signed)
Lm w/Echo results.

## 2010-09-22 NOTE — Telephone Encounter (Signed)
Message copied by Lorayne Bender on Wed Sep 22, 2010  1:42 PM ------      Message from: Rosalio Macadamia      Created: Tue Sep 21, 2010  8:04 AM       Ok to report. Echo is satisfactory. See Dr. Elvis Coil comments.

## 2010-09-24 ENCOUNTER — Other Ambulatory Visit (INDEPENDENT_AMBULATORY_CARE_PROVIDER_SITE_OTHER): Payer: Medicare Other | Admitting: *Deleted

## 2010-09-24 DIAGNOSIS — E875 Hyperkalemia: Secondary | ICD-10-CM

## 2010-09-24 LAB — BASIC METABOLIC PANEL
BUN: 17 mg/dL (ref 6–23)
CO2: 28 mEq/L (ref 19–32)
Calcium: 9.4 mg/dL (ref 8.4–10.5)
Chloride: 103 mEq/L (ref 96–112)
Creatinine, Ser: 0.7 mg/dL (ref 0.4–1.2)
GFR: 84.25 mL/min (ref >60.00)
Glucose, Bld: 92 mg/dL (ref 70–99)
Potassium: 3.7 mEq/L (ref 3.5–5.1)
Sodium: 141 mEq/L (ref 135–145)

## 2010-10-06 LAB — URINE MICROSCOPIC-ADD ON

## 2010-10-06 LAB — URINALYSIS, ROUTINE W REFLEX MICROSCOPIC
Bilirubin Urine: NEGATIVE
Protein, ur: NEGATIVE
Specific Gravity, Urine: 1.011
Urobilinogen, UA: 1

## 2010-10-07 LAB — COMPREHENSIVE METABOLIC PANEL
ALT: 17
AST: 25
Albumin: 3.5
Alkaline Phosphatase: 55
BUN: 10
Chloride: 103
Potassium: 4.2
Total Bilirubin: 1

## 2010-10-07 LAB — CBC
HCT: 38.8
Platelets: 206
WBC: 6.9

## 2010-10-07 LAB — LIPID PANEL
LDL Cholesterol: 97
VLDL: 19

## 2010-10-07 LAB — TSH: TSH: 4.608

## 2010-10-08 LAB — VITAMIN D 25 HYDROXY (VIT D DEFICIENCY, FRACTURES): Vit D, 25-Hydroxy: 42 (ref 30–89)

## 2010-10-12 LAB — POCT URINALYSIS DIP (DEVICE)
Glucose, UA: NEGATIVE
Ketones, ur: NEGATIVE
Operator id: 239701
Specific Gravity, Urine: 1.02

## 2010-10-12 LAB — POCT I-STAT, CHEM 8
BUN: 6
Creatinine, Ser: 0.9
Hemoglobin: 15
Potassium: 4.3
Sodium: 140

## 2010-10-12 LAB — CBC
RBC: 4.92
WBC: 5.7

## 2010-10-15 LAB — DIFFERENTIAL
Basophils Absolute: 0 10*3/uL (ref 0.0–0.1)
Basophils Relative: 0 % (ref 0–1)
Eosinophils Absolute: 0.1 10*3/uL (ref 0.0–0.7)
Eosinophils Relative: 3 % (ref 0–5)
Lymphocytes Relative: 32 % (ref 12–46)
Monocytes Absolute: 0.4 10*3/uL (ref 0.1–1.0)

## 2010-10-15 LAB — LIPASE, BLOOD: Lipase: 43 U/L (ref 11–59)

## 2010-10-15 LAB — LIPID PANEL
LDL Cholesterol: 110 mg/dL — ABNORMAL HIGH (ref 0–99)
Total CHOL/HDL Ratio: 3.7 RATIO
VLDL: 16 mg/dL (ref 0–40)

## 2010-10-15 LAB — COMPREHENSIVE METABOLIC PANEL
AST: 20 U/L (ref 0–37)
Alkaline Phosphatase: 61 U/L (ref 39–117)
CO2: 28 mEq/L (ref 19–32)
Chloride: 106 mEq/L (ref 96–112)
Creatinine, Ser: 0.69 mg/dL (ref 0.4–1.2)
GFR calc Af Amer: >60 mL/min (ref >60)
GFR calc non Af Amer: >60 mL/min (ref >60)
Potassium: 4.1 mEq/L (ref 3.5–5.1)
Total Bilirubin: 0.9 mg/dL (ref 0.3–1.2)

## 2010-10-15 LAB — CBC
HCT: 40.2 % (ref 36.0–46.0)
MCV: 86.6 fL (ref 78.0–100.0)
RBC: 4.64 MIL/uL (ref 3.87–5.11)
WBC: 5.2 10*3/uL (ref 4.0–10.5)

## 2010-10-15 LAB — AMYLASE: Amylase: 73 U/L (ref 27–131)

## 2010-10-21 LAB — CBC
HCT: 37.4
Hemoglobin: 12
MCV: 82.3
Platelets: 225
RBC: 4.54
WBC: 5.2

## 2010-10-21 LAB — BASIC METABOLIC PANEL
Chloride: 108
GFR calc Af Amer: >60
GFR calc non Af Amer: >60
Potassium: 4.8
Sodium: 141

## 2010-10-21 LAB — APTT: aPTT: 27

## 2010-11-10 ENCOUNTER — Other Ambulatory Visit: Payer: Self-pay | Admitting: *Deleted

## 2010-11-10 MED ORDER — ALPRAZOLAM 0.25 MG PO TABS: 0.2500 mg | ORAL_TABLET | Freq: Every evening | ORAL | Status: DC | PRN

## 2010-11-10 NOTE — Telephone Encounter (Signed)
escribe medication per fax request  

## 2010-12-20 ENCOUNTER — Telehealth: Payer: Self-pay | Admitting: Cardiology

## 2010-12-20 NOTE — Telephone Encounter (Signed)
Advised we can schedule [place] in Feb. States she made an app to see Dr. Swaziland in Feb so we can order the [place] for AAA then. She is agreeable.

## 2010-12-20 NOTE — Telephone Encounter (Signed)
Pt says she always has an abdominal US every February while with Deborah Chalk, does she need to repeat that?

## 2010-12-25 ENCOUNTER — Other Ambulatory Visit: Payer: Self-pay | Admitting: Cardiology

## 2011-01-24 ENCOUNTER — Other Ambulatory Visit: Payer: Self-pay | Admitting: Cardiology

## 2011-01-26 ENCOUNTER — Other Ambulatory Visit: Payer: Self-pay | Admitting: Cardiology

## 2011-02-25 ENCOUNTER — Ambulatory Visit (INDEPENDENT_AMBULATORY_CARE_PROVIDER_SITE_OTHER): Payer: Medicare Other | Admitting: Cardiology

## 2011-02-25 ENCOUNTER — Encounter: Payer: Self-pay | Admitting: Cardiology

## 2011-02-25 VITALS — BP 133/80 | HR 68 | Ht 67.0 in | Wt 217.0 lb

## 2011-02-25 DIAGNOSIS — I2510 Atherosclerotic heart disease of native coronary artery without angina pectoris: Secondary | ICD-10-CM

## 2011-02-25 DIAGNOSIS — I714 Abdominal aortic aneurysm, without rupture: Secondary | ICD-10-CM

## 2011-02-25 DIAGNOSIS — R011 Cardiac murmur, unspecified: Secondary | ICD-10-CM

## 2011-02-25 DIAGNOSIS — F17200 Nicotine dependence, unspecified, uncomplicated: Secondary | ICD-10-CM

## 2011-02-25 DIAGNOSIS — I10 Essential (primary) hypertension: Secondary | ICD-10-CM

## 2011-02-25 DIAGNOSIS — Z720 Tobacco use: Secondary | ICD-10-CM

## 2011-02-25 NOTE — Assessment & Plan Note (Addendum)
She had redo coronary bypass surgery in 2002. Cardiac catheterization in 2008 showed patent grafts. I recommended a followup Lexiscan Myoview study. We need to keep in mind that her last stress test was abnormal at the time of her cardiac catheterization. However if she has new perfusion abnormalities we may need to evaluate further. On her last visit her lipid parameters were excellent so we will continue on her current therapy and recheck fasting lab work in 6 months.

## 2011-02-25 NOTE — Assessment & Plan Note (Signed)
Blood pressure control is satisfactory. 

## 2011-02-25 NOTE — Progress Notes (Signed)
[name] Date of Birth: 1940/02/20   History of Present Illness: [name] is seen today for a followup visit.  She is a former patient of Dr. Ronnald Nian. She has known CAD with redo CABG. She is still smoking. She did try the Chantix but had to stop due to very bad thoughts. She is sedentary. She denies any significant shortness of breath, palpitations, or dyspnea. She has a known abdominal aortic aneurysm with last ultrasound in February 2012 measuring this a 3.3 cm. She denies any significant claudication symptoms. Her last cardiac evaluation in 2008 included a pharmacologic nuclear stress test. This showed anterior wall ischemia. This led to a cardiac catheterization which demonstrated her grafts were all patent. This included an LIMA graft to the LAD, saphenous vein graft to the first and second obtuse marginal vessels, and RIMA graft to the distal LAD. Her native vessels were all occluded.  Current Outpatient Prescriptions on File Prior to Visit  Medication Sig Dispense Refill  . aspirin 325 MG tablet Take 325 mg by mouth daily.        [name] atenolol (TENORMIN) 50 MG tablet TAKE  ONE TABLET BY MOUTH NIGHTLY AT BEDTIME  30 tablet  4  . Cholecalciferol (VITAMIN D) 2000 UNITS tablet Take 2,000 Units by mouth. Occasionally       . FLUoxetine (PROZAC) 40 MG capsule Take 1 capsule (40 mg total) by mouth daily.  90 capsule  3  . levothyroxine (SYNTHROID, LEVOTHROID) 50 MCG tablet Take 50 mcg by mouth daily.        . Omega-3 Fatty Acids (FISH OIL PO) Take 2 capsules by mouth daily.        . simvastatin (ZOCOR) 20 MG tablet TAKE ONE TABLET BY MOUTH ONE TIME DAILY  30 tablet  4  . ALPRAZolam (XANAX) 0.25 MG tablet Take 1 tablet (0.25 mg total) by mouth at bedtime as needed for sleep.  30 tablet  1    Allergies  Allergen Reactions  . Persantine     Past Medical History  Diagnosis Date  . Hyperlipidemia   . Hypertension   . Ischemic heart disease     had redo bypass surgery in 2001, at that  time she had a free right internal mammary to the LAD, LAD endarterectomy, sequential vein graft to the OM1 and OM2 -- last catheterization was in 2008  . Hypothyroidism   . Obesity     with gastric bypass surgery in 2004  . History of tobacco abuse     Recurrent cigarette smoking  . Abdominal aortic aneurysm 2/12    infrarenal diagnosed with maximum measurement at 3.3 cm  . GERD (gastroesophageal reflux disease)   . Depression   . Leg fracture     remote right leg fracture    Past Surgical History  Procedure Date  . Gastric bypass 2004  . Carpal tunnel release   . Coronary artery bypass graft 1985  . Coronary artery bypass graft 03/09/1999    Redo CABG x3 --  with the free right internal mammary artey to the LAD with an LAD endarterectomy -- Sequential saphenous vein graft to OM1 and OM2                   . Cardiac catheterization 02/16/1999    Left heart catheterization with selective coronary  angiography, left ventricular angiography (RAO and LAO views), angiography of left internal mammary artery, and angiography of saphenous vein grafts  . Cardiac catheterization 10/20/2006  Est. EF of 55% -- Totally occuladed native coronary circulation -- Persistent patency of the right mammary graft and the distal LAD artery with peristent patency of the left mammary graft to the proximal LAD artery with persistant patency of the saphenous vein graft to the sequentail branches of the OM          History  Smoking status  . Current Everyday Smoker -- 0.5 packs/day  . Types: Cigarettes  . Last Attempt to Quit: 01/10/1985  Smokeless tobacco  . Not on file    History  Alcohol Use No    Family History  Problem Relation Age of Onset  . Stroke Father   . Diabetes Mother   . Cancer Mother     pancreatic cancer    Review of Systems: The review of systems is as above.  All other systems were reviewed and are negative.  Physical Exam: BP 133/80  Pulse 68  Ht 5\' 7"  (1.702 m)  Wt  217 lb (98.431 kg)  BMI 33.99 kg/m2 Patient is very pleasant and in no acute distress. She is obese. Skin is warm and dry. Color is normal.  HEENT is unremarkable. Normocephalic/atraumatic. PERRL. Sclera are nonicteric. Neck is supple. No masses. No JVD. Lungs are clear. Cardiac exam shows a regular rate and rhythm. She has a soft outflow murmur noted today. Abdomen is obese but soft. Extremities are without edema. Pedal pulses are nonpalpable. Gait and ROM are intact. No gross neurologic deficits noted.   LABORATORY DATA: ECG today demonstrates normal sinus rhythm with voltage criteria for LVH.  Assessment / Plan:

## 2011-02-25 NOTE — Patient Instructions (Signed)
You need to quit smoking  We will schedule you for Abdominal ultrasound and lower extremity dopplers.  We will schedule you for a nuclear stress test.  I will see you again in 6 months with fasting lab work.  You need to exercise daily.

## 2011-02-25 NOTE — Assessment & Plan Note (Signed)
Prior echocardiogram showed mild aortic stenosis. LV function was normal.

## 2011-02-25 NOTE — Assessment & Plan Note (Signed)
She has a known abdominal aortic aneurysm. We will schedule her for yearly abdominal ultrasound for followup. She also has poor pedal pulses and we will obtain lower extremity arterial Dopplers as well.

## 2011-02-25 NOTE — Assessment & Plan Note (Signed)
We discussed smoking cessation strategies. She is intolerant to Chantix. I recommend a trial of nicotine replacement products. She formerly was able to quit for 20 years but then resumed under a period of stress.

## 2011-03-08 ENCOUNTER — Ambulatory Visit (HOSPITAL_COMMUNITY): Payer: Medicare Other | Attending: Cardiovascular Disease | Admitting: Radiology

## 2011-03-08 DIAGNOSIS — I2510 Atherosclerotic heart disease of native coronary artery without angina pectoris: Secondary | ICD-10-CM

## 2011-03-08 DIAGNOSIS — F17200 Nicotine dependence, unspecified, uncomplicated: Secondary | ICD-10-CM | POA: Insufficient documentation

## 2011-03-08 DIAGNOSIS — R110 Nausea: Secondary | ICD-10-CM | POA: Insufficient documentation

## 2011-03-08 DIAGNOSIS — I10 Essential (primary) hypertension: Secondary | ICD-10-CM | POA: Insufficient documentation

## 2011-03-08 DIAGNOSIS — I714 Abdominal aortic aneurysm, without rupture, unspecified: Secondary | ICD-10-CM | POA: Insufficient documentation

## 2011-03-08 DIAGNOSIS — E785 Hyperlipidemia, unspecified: Secondary | ICD-10-CM | POA: Insufficient documentation

## 2011-03-08 DIAGNOSIS — Z951 Presence of aortocoronary bypass graft: Secondary | ICD-10-CM | POA: Insufficient documentation

## 2011-03-08 DIAGNOSIS — Z720 Tobacco use: Secondary | ICD-10-CM

## 2011-03-08 MED ORDER — TECHNETIUM TC 99M TETROFOSMIN IV KIT
33.0000 | PACK | Freq: Once | INTRAVENOUS | Status: AC | PRN
  Administered 2011-03-08: 33 via INTRAVENOUS

## 2011-03-08 MED ORDER — REGADENOSON 0.4 MG/5ML IV SOLN
0.4000 mg | Freq: Once | INTRAVENOUS | Status: AC
  Administered 2011-03-08: 0.4 mg via INTRAVENOUS

## 2011-03-08 NOTE — Progress Notes (Signed)
[facility] SITE 3 NUCLEAR MED [address] [place] [address] [phone]  Cardiology Nuclear Med Study  [name] is a 71 y.o. female [id number] 1940/08/28   Nuclear Med Background Indication for Stress Test:  Evaluation for Ischemia and Graft Patency History: '01 CABG: Redo x3, 9/08 MPS: EF: 58%, Anterior ischemia, 10/08 Heart Cath: patent grafts native vessels all occluded, 9/12 ECHO: EF: 60-65% Cardiac Risk Factors: Hypertension, Lipids and Smoker  Symptoms:  Nausea   Nuclear Pre-Procedure Caffeine/Decaff Intake:  None NPO After: 7:00pm   Lungs:  clear IV 0.9% NS with Angio Cath:  20g  IV Site: R Antecubital  IV Started by:  Stanton Kidney, EMT-P  Chest Size (in):  44 Cup Size: DDD  Height: 5\' 7"  (1.702 m)  Weight:  219 lb (99.338 kg)  BMI:  Body mass index is 34.30 kg/(m^2). Tech Comments:  Atenolol held > 36 hours, per patient.    Nuclear Med Study 1 or 2 day study: 2 day  Stress Test Type:  Lexiscan  Reading MD: B.Lova Urbieta,M.D. Order Authorizing Provider:  P.Jordan  Resting Radionuclide: Technetium 74m Tetrofosmin  Resting Radionuclide Dose: 33.0 mCi on      03-09-11  Stress Radionuclide:  Technetium 25m Tetrofosmin  Stress Radionuclide Dose: 33.0 mCi  On      03-08-11          Stress Protocol Rest HR: 65 Stress HR: 80  Rest BP: 142/70 Stress BP: 147/98  Exercise Time (min): n/a METS: n/a   Predicted Max HR: 152 bpm % Max HR: 52.63 bpm Rate Pressure Product: 21308   Dose of Adenosine (mg):  n/a Dose of Lexiscan: 0.4 mg  Dose of Atropine (mg): n/a Dose of Dobutamine: n/a mcg/kg/min (at max HR)  Stress Test Technologist: Milana Na, EMT-P  Nuclear Technologist:  Doyne Keel, CNMT     Rest Procedure:  Myocardial perfusion imaging was performed at rest 45 minutes following the intravenous administration of Technetium 83m Tetrofosmin. Rest ECG: NSR  Stress Procedure:  The patient received IV Lexiscan 0.4 mg over 15-seconds.   Technetium 56m Tetrofosmin injected at 30-seconds.  There were no significant changes and + sob with Lexiscan.  Quantitative spect images were obtained after a 45 minute delay. Stress ECG: No significant ST segment change suggestive of ischemia.  QPS Raw Data Images:  Acquisition technically good; normal left ventricular size. Stress Images:  There is decreased uptake in the anterior wall and apex. Rest Images:  There is decreased uptake in the anterior wall and apex, less prominent compared to the stress images. Subtraction (SDS):  These findings are consistent with soft tissue attenuation and mild anterior ischemia. Transient Ischemic Dilatation (Normal <1.22):  0.97 Lung/Heart Ratio (Normal <0.45):  0.28  Quantitative Gated Spect Images QGS EDV:  99 ml QGS ESV:  33 ml QGS cine images:  NL LV Function; NL Wall Motion QGS EF: 67%  Impression Exercise Capacity:  Lexiscan with no exercise. BP Response:  Normal blood pressure response. Clinical Symptoms:  No chest pain. ECG Impression:  No significant ST segment change suggestive of ischemia. Comparison with Prior Nuclear Study: No images to compare  Overall Impression:  Abnormal stress nuclear study with a small, partially reversible distal anterior and apical defect suggestive of soft tissue attenuation and mild anterior ischemia.  [name]

## 2011-03-09 ENCOUNTER — Other Ambulatory Visit: Payer: Self-pay | Admitting: *Deleted

## 2011-03-09 ENCOUNTER — Ambulatory Visit (HOSPITAL_COMMUNITY): Payer: Medicare Other | Attending: Cardiology

## 2011-03-09 DIAGNOSIS — R0989 Other specified symptoms and signs involving the circulatory and respiratory systems: Secondary | ICD-10-CM

## 2011-03-09 MED ORDER — TECHNETIUM TC 99M TETROFOSMIN IV KIT
30.0000 | PACK | Freq: Once | INTRAVENOUS | Status: AC | PRN
  Administered 2011-03-09: 30 via INTRAVENOUS

## 2011-03-09 MED ORDER — NITROGLYCERIN 0.4 MG SL SUBL: 0.4000 mg | SUBLINGUAL_TABLET | SUBLINGUAL | Status: DC | PRN

## 2011-03-11 ENCOUNTER — Telehealth: Payer: Self-pay

## 2011-03-11 MED ORDER — NITROGLYCERIN 0.4 MG SL SUBL: 0.4000 mg | SUBLINGUAL_TABLET | SUBLINGUAL | Status: DC | PRN

## 2011-03-11 NOTE — Telephone Encounter (Signed)
Patient called wanting xanax prescription sent to [facility].Advised this would be last prescription she would need to get from PCP

## 2011-03-11 NOTE — Progress Notes (Signed)
Patient ID: [name], female   DOB: 1940-01-11, 71 y.o.   MRN: [id number] Nuclear stress test shows mild anterior wall ischemia. This is unchanged from prior study in 2012 at which time cath was OK. No new findings. Continue medical Rx.  Elainna Eshleman [place] MD, [facility]

## 2011-03-11 NOTE — Progress Notes (Signed)
Patient called results given

## 2011-03-11 NOTE — Telephone Encounter (Signed)
Patient called was told myoview unchanged from prior study 2012.No new findings.Advised to continue medications.

## 2011-03-14 ENCOUNTER — Encounter (INDEPENDENT_AMBULATORY_CARE_PROVIDER_SITE_OTHER): Payer: Medicare Other

## 2011-03-14 ENCOUNTER — Other Ambulatory Visit: Payer: Self-pay | Admitting: *Deleted

## 2011-03-14 DIAGNOSIS — I714 Abdominal aortic aneurysm, without rupture, unspecified: Secondary | ICD-10-CM

## 2011-03-14 DIAGNOSIS — I2510 Atherosclerotic heart disease of native coronary artery without angina pectoris: Secondary | ICD-10-CM

## 2011-03-14 DIAGNOSIS — I10 Essential (primary) hypertension: Secondary | ICD-10-CM

## 2011-03-14 DIAGNOSIS — Z720 Tobacco use: Secondary | ICD-10-CM

## 2011-03-14 MED ORDER — HYDROCHLOROTHIAZIDE 25 MG PO TABS: 25.0000 mg | ORAL_TABLET | ORAL | Status: DC | PRN

## 2011-03-18 ENCOUNTER — Encounter (INDEPENDENT_AMBULATORY_CARE_PROVIDER_SITE_OTHER): Payer: Medicare Other

## 2011-03-18 DIAGNOSIS — Z720 Tobacco use: Secondary | ICD-10-CM

## 2011-03-18 DIAGNOSIS — I10 Essential (primary) hypertension: Secondary | ICD-10-CM

## 2011-03-18 DIAGNOSIS — I2510 Atherosclerotic heart disease of native coronary artery without angina pectoris: Secondary | ICD-10-CM

## 2011-03-18 DIAGNOSIS — I714 Abdominal aortic aneurysm, without rupture: Secondary | ICD-10-CM

## 2011-03-18 DIAGNOSIS — I739 Peripheral vascular disease, unspecified: Secondary | ICD-10-CM

## 2011-03-23 ENCOUNTER — Other Ambulatory Visit: Payer: Self-pay | Admitting: Cardiology

## 2011-03-23 NOTE — Telephone Encounter (Signed)
..   Requested Prescriptions   Pending Prescriptions Disp Refills  . atenolol (TENORMIN) 50 MG tablet [Pharmacy Med Name: ATENOLOL 50 MG      TAB GENE] 30 tablet 11    Sig: TAKE  ONE TABLET BY MOUTH NIGHTLY AT BEDTIME

## 2011-03-25 ENCOUNTER — Other Ambulatory Visit: Payer: Self-pay

## 2011-03-25 ENCOUNTER — Telehealth: Payer: Self-pay | Admitting: Cardiology

## 2011-03-25 NOTE — Telephone Encounter (Signed)
Patient called was told results lower ext dopplers.Schedulers to be calling to schedule appointment with Dr.Arida.

## 2011-03-25 NOTE — Telephone Encounter (Signed)
Appointment with Dr. Kirke Corin 04/11/11 @ 1:45. Patient is aware.

## 2011-03-25 NOTE — Telephone Encounter (Signed)
Fu call °Patient returning your call °

## 2011-04-13 ENCOUNTER — Ambulatory Visit (INDEPENDENT_AMBULATORY_CARE_PROVIDER_SITE_OTHER): Payer: Medicare Other | Admitting: Cardiovascular Disease

## 2011-04-13 ENCOUNTER — Encounter: Payer: Self-pay | Admitting: Cardiovascular Disease

## 2011-04-13 VITALS — BP 128/80 | HR 60 | Ht 67.0 in | Wt 223.0 lb

## 2011-04-13 DIAGNOSIS — I739 Peripheral vascular disease, unspecified: Secondary | ICD-10-CM

## 2011-04-13 DIAGNOSIS — I779 Disorder of arteries and arterioles, unspecified: Secondary | ICD-10-CM

## 2011-04-13 DIAGNOSIS — I745 Embolism and thrombosis of iliac artery: Secondary | ICD-10-CM

## 2011-04-13 HISTORY — DX: Peripheral vascular disease, unspecified: I73.9

## 2011-04-13 LAB — BASIC METABOLIC PANEL
BUN: 18 mg/dL (ref 6–23)
CO2: 27 mEq/L (ref 19–32)
Chloride: 102 mEq/L (ref 96–112)
Creatinine, Ser: 1 mg/dL (ref 0.4–1.2)
Glucose, Bld: 108 mg/dL — ABNORMAL HIGH (ref 70–99)

## 2011-04-13 NOTE — Assessment & Plan Note (Addendum)
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 Surprisingly, she endorses no symptoms of claudication on the right side. She likely has good collateral flow. Given that she is asymptomatic, the management is likely observation at this time. However, I think it would be useful to know the anatomy of the occlusion in case an intervention is planned in the future. Thus, I recommend a CTA of the abdominal aorta with lower extremity runoff.  I advised her to start a walking exercise program and monitor her symptoms. I will plan on seeing her again in 6 months with ABI.

## 2011-04-13 NOTE — Progress Notes (Signed)
HPI  This is a 71 year old female who is referred by Dr. [place] for evaluation of right iliac occlusion which was incidentally found recently. She was not aware of any previous history of peripheral arterial disease. She has history of coronary artery disease status post CABG. She is known to have a small abdominal aortic aneurysm and thus had evaluation recently with an ultrasound, ABI and arterial Doppler. The ultrasound showed that the aneurysm is still small. ABI was mildly reduced on the right side at 0.61 with evidence of right total iliac occlusion. The ABI was normal on the left side. Surprisingly, the patient reports no symptoms of claudication whatsoever. She has no back discomfort when she is able to walk without significant limitations.  Allergies  Allergen Reactions  . Chantix (Varenicline Tartrate)   . Persantine      Current Outpatient Prescriptions on File Prior to Visit  Medication Sig Dispense Refill  . aspirin 325 MG tablet Take 325 mg by mouth daily.        [name] atenolol (TENORMIN) 50 MG tablet TAKE  ONE TABLET BY MOUTH NIGHTLY AT BEDTIME  30 tablet  11  . Cholecalciferol (VITAMIN D) 2000 UNITS tablet Take 2,000 Units by mouth. Occasionally       . FLUoxetine (PROZAC) 40 MG capsule Take 1 capsule (40 mg total) by mouth daily.  90 capsule  3  . hydrochlorothiazide (HYDRODIURIL) 25 MG tablet Take 1 tablet (25 mg total) by mouth as needed.  30 tablet  4  . levothyroxine (SYNTHROID, LEVOTHROID) 50 MCG tablet Take 50 mcg by mouth daily.        . nitroGLYCERIN (NITROSTAT) 0.4 MG SL tablet Place 1 tablet (0.4 mg total) under the tongue every 5 (five) minutes as needed.  25 tablet  11  . Omega-3 Fatty Acids (FISH OIL PO) Take 2 capsules by mouth daily.        . simvastatin (ZOCOR) 20 MG tablet TAKE ONE TABLET BY MOUTH ONE TIME DAILY  30 tablet  4  . ALPRAZolam (XANAX) 0.25 MG tablet Take 1 tablet (0.25 mg total) by mouth at bedtime as needed for sleep.  30 tablet  1     Past  Medical History  Diagnosis Date  . Hyperlipidemia   . Hypertension   . Ischemic heart disease     had redo bypass surgery in 2001, at that time she had a free right internal mammary to the LAD, LAD endarterectomy, sequential vein graft to the OM1 and OM2 -- last catheterization was in 2008  . Hypothyroidism   . Obesity     with gastric bypass surgery in 2004  . History of tobacco abuse     Recurrent cigarette smoking  . Abdominal aortic aneurysm 2/12    infrarenal diagnosed with maximum measurement at 3.3 cm  . GERD (gastroesophageal reflux disease)   . Depression   . Leg fracture     remote right leg fracture     Past Surgical History  Procedure Date  . Gastric bypass 2004  . Carpal tunnel release   . Coronary artery bypass graft 1985  . Coronary artery bypass graft 03/09/1999    Redo CABG x3 --  with the free right internal mammary artey to the LAD with an LAD endarterectomy -- Sequential saphenous vein graft to OM1 and OM2                   . Cardiac catheterization 02/16/1999    Left heart  catheterization with selective coronary  angiography, left ventricular angiography (RAO and LAO views), angiography of left internal mammary artery, and angiography of saphenous vein grafts  . Cardiac catheterization 10/20/2006    Est. EF of 55% -- Totally occuladed native coronary circulation -- Persistent patency of the right mammary graft and the distal LAD artery with peristent patency of the left mammary graft to the proximal LAD artery with persistant patency of the saphenous vein graft to the sequentail branches of the OM           Family History  Problem Relation Age of Onset  . Stroke Father   . Diabetes Mother   . Cancer Mother     pancreatic cancer     History   Social History  . Marital Status: Married    Spouse Name: N/A    Number of Children: N/A  . Years of Education: N/A   Occupational History  . Not on file.   Social History Main Topics  . Smoking status:  Former Smoker -- 0.5 packs/day    Types: Cigarettes    Quit date: 01/10/1985  . Smokeless tobacco: Not on file  . Alcohol Use: No  . Drug Use: No  . Sexually Active: No   Other Topics Concern  . Not on file   Social History Narrative  . No narrative on file     ROS Constitutional: Negative for fever, chills, diaphoresis, activity change, appetite change and fatigue.  HENT: Negative for hearing loss, nosebleeds, congestion, sore throat, facial swelling, drooling, trouble swallowing, neck pain, voice change, sinus pressure and tinnitus.  Eyes: Negative for photophobia, pain, discharge and visual disturbance.  Respiratory: Negative for apnea, cough, chest tightness and wheezing.  Cardiovascular: Negative for chest pain, palpitations and leg swelling.  Gastrointestinal: Negative for nausea, vomiting, abdominal pain, diarrhea, constipation, blood in stool and abdominal distention.  Genitourinary: Negative for dysuria, urgency, frequency, hematuria and decreased urine volume.  Musculoskeletal: Negative for myalgias, back pain, joint swelling, arthralgias and gait problem.  Skin: Negative for color change, pallor, rash and wound.  Neurological: Negative for dizziness, tremors, seizures, syncope, speech difficulty, weakness, light-headedness, numbness and headaches.  Psychiatric/Behavioral: Negative for suicidal ideas, hallucinations, behavioral problems and agitation. The patient is not nervous/anxious.     PHYSICAL EXAM   BP 128/80  Pulse 60  Ht 5\' 7"  (1.702 m)  Wt 101.152 kg (223 lb)  BMI 34.93 kg/m2  Constitutional: She is oriented to person, place, and time. She appears well-developed and well-nourished. No distress.  HENT: No nasal discharge.  Head: Normocephalic and atraumatic.  Eyes: Pupils are equal and round. Right eye exhibits no discharge. Left eye exhibits no discharge.  Neck: Normal range of motion. Neck supple. No JVD present. No thyromegaly present.    Cardiovascular: Normal rate, regular rhythm, normal heart sounds. Exam reveals no gallop and no friction rub. No murmur heard.  Pulmonary/Chest: Effort normal and breath sounds normal. No stridor. No respiratory distress. She has no wheezes. She has no rales. She exhibits no tenderness.  Abdominal: Soft. Bowel sounds are normal. She exhibits no distension. There is no tenderness. There is no rebound and no guarding.  Musculoskeletal: Normal range of motion. She exhibits no edema and no tenderness.  Neurological: She is alert and oriented to person, place, and time. Coordination normal.  Skin: Skin is warm and dry. No rash noted. She is not diaphoretic. No erythema. No pallor.  Psychiatric: She has a normal mood and affect. Her behavior is normal.  Judgment and thought content normal.  vascular: Femoral pulse: Absent on the right side and normal on the left side. PT/DP: Absent on the right side and faint on the left side. Radial pulse is normal bilaterally.   ASSESSMENT AND PLAN

## 2011-04-13 NOTE — Patient Instructions (Addendum)
The current medical regimen is effective;  continue present plan and medications.  Non-Cardiac CT Angiography (CTA), is a special type of CT scan that uses a computer to produce multi-dimensional views of major blood vessels throughout the body. In CT angiography, a contrast material is injected through an IV to help visualize the blood vessels  Your physician has requested that you have an ankle brachial index (ABI) in 6 months. During this test an ultrasound and blood pressure cuff are used to evaluate the arteries that supply the arms and legs with blood. Allow thirty minutes for this exam. There are no restrictions or special instructions.  Follow up in 6 months with Dr Kirke Corin.  You will receive a letter in the mail 2 months before you are due.  Please call us when you receive this letter to schedule your follow up appointment.

## 2011-04-14 ENCOUNTER — Encounter: Payer: Self-pay | Admitting: Cardiovascular Disease

## 2011-04-15 ENCOUNTER — Ambulatory Visit (INDEPENDENT_AMBULATORY_CARE_PROVIDER_SITE_OTHER)
Admission: RE | Admit: 2011-04-15 | Discharge: 2011-04-15 | Disposition: A | Discharge status: Home / Self Care | Payer: Medicare Other | Source: Ambulatory Visit | Attending: Cardiovascular Disease | Admitting: Cardiovascular Disease

## 2011-04-15 DIAGNOSIS — I745 Embolism and thrombosis of iliac artery: Secondary | ICD-10-CM

## 2011-04-15 MED ORDER — IOHEXOL 300 MG/ML  SOLN
125.0000 mL | Freq: Once | INTRAMUSCULAR | Status: AC | PRN
  Administered 2011-04-15: 125 mL via INTRAVENOUS

## 2011-04-25 ENCOUNTER — Telehealth: Payer: Self-pay | Admitting: Cardiology

## 2011-04-25 NOTE — Telephone Encounter (Signed)
Pt calling re results of CT, pls call [phone]

## 2011-04-25 NOTE — Telephone Encounter (Signed)
Patient called wanted results of CT of abdomen.States Dr.Arida was to call her back.Will forward to Dr.Arida.

## 2011-04-25 NOTE — Telephone Encounter (Signed)
I called the patient and discussed results with her

## 2011-06-22 ENCOUNTER — Other Ambulatory Visit: Payer: Self-pay | Admitting: Cardiology

## 2011-06-22 MED ORDER — SIMVASTATIN 20 MG PO TABS: 20.0000 mg | ORAL_TABLET | Freq: Every day | ORAL | Status: DC

## 2011-09-14 ENCOUNTER — Encounter: Payer: Self-pay | Admitting: Cardiovascular Disease

## 2011-09-14 ENCOUNTER — Ambulatory Visit (INDEPENDENT_AMBULATORY_CARE_PROVIDER_SITE_OTHER): Payer: Medicare Other | Admitting: Cardiovascular Disease

## 2011-09-14 VITALS — BP 152/67 | HR 70 | Ht 67.0 in | Wt 219.1 lb

## 2011-09-14 DIAGNOSIS — I739 Peripheral vascular disease, unspecified: Secondary | ICD-10-CM

## 2011-09-14 NOTE — Progress Notes (Signed)
HPI  This is a 71 year old female who is here today for a followup visit regarding known chronic occlusion of right iliac artery which was confirmed by CTA. ABI was 0.6 on the right side. She has history of coronary artery disease status post CABG. She is known to have a small abdominal aortic aneurysm.  No intervention was performed to the right iliac occlusion given that she has been asymptomatic. She is here today for a followup visit and reports no new symptoms. She is able to perform all activities of daily living without lower extremity discomfort. She denies any back or buttock discomfort.  Allergies  Allergen Reactions  . Chantix (Varenicline Tartrate)   . Dipyridamole      Current Outpatient Prescriptions on File Prior to Visit  Medication Sig Dispense Refill  . ALPRAZolam (XANAX) 0.25 MG tablet Take 1 tablet (0.25 mg total) by mouth at bedtime as needed for sleep.  30 tablet  1  . aspirin 325 MG tablet Take 325 mg by mouth daily.        [name] atenolol (TENORMIN) 50 MG tablet TAKE  ONE TABLET BY MOUTH NIGHTLY AT BEDTIME  30 tablet  11  . FLUoxetine (PROZAC) 40 MG capsule Take 1 capsule (40 mg total) by mouth daily.  90 capsule  3  . hydrochlorothiazide (HYDRODIURIL) 25 MG tablet Take 1 tablet (25 mg total) by mouth as needed.  30 tablet  4  . levothyroxine (SYNTHROID, LEVOTHROID) 50 MCG tablet Take 50 mcg by mouth daily.        . nitroGLYCERIN (NITROSTAT) 0.4 MG SL tablet Place 1 tablet (0.4 mg total) under the tongue every 5 (five) minutes as needed.  25 tablet  11  . Omega-3 Fatty Acids (FISH OIL PO) Take 2 capsules by mouth daily.        . simvastatin (ZOCOR) 20 MG tablet Take 1 tablet (20 mg total) by mouth daily.  30 tablet  4     Past Medical History  Diagnosis Date  . Hyperlipidemia   . Hypertension   . Ischemic heart disease     had redo bypass surgery in 2001, at that time she had a free right internal mammary to the LAD, LAD endarterectomy, sequential vein graft to  the OM1 and OM2 -- last catheterization was in 2008  . Hypothyroidism   . Obesity     with gastric bypass surgery in 2004  . History of tobacco abuse     Recurrent cigarette smoking  . Abdominal aortic aneurysm 2/12    infrarenal diagnosed with maximum measurement at 3.3 cm  . GERD (gastroesophageal reflux disease)   . Depression   . Leg fracture     remote right leg fracture  . PAD (peripheral artery disease) 04/13/2011     Past Surgical History  Procedure Date  . Gastric bypass 2004  . Carpal tunnel release   . Coronary artery bypass graft 1985  . Coronary artery bypass graft 03/09/1999    Redo CABG x3 --  with the free right internal mammary artey to the LAD with an LAD endarterectomy -- Sequential saphenous vein graft to OM1 and OM2                   . Cardiac catheterization 02/16/1999    Left heart catheterization with selective coronary  angiography, left ventricular angiography (RAO and LAO views), angiography of left internal mammary artery, and angiography of saphenous vein grafts  . Cardiac catheterization 10/20/2006  Est. EF of 55% -- Totally occuladed native coronary circulation -- Persistent patency of the right mammary graft and the distal LAD artery with peristent patency of the left mammary graft to the proximal LAD artery with persistant patency of the saphenous vein graft to the sequentail branches of the OM           Family History  Problem Relation Age of Onset  . Stroke Father   . Diabetes Mother   . Cancer Mother     pancreatic cancer     History   Social History  . Marital Status: Married    Spouse Name: N/A    Number of Children: N/A  . Years of Education: N/A   Occupational History  . Not on file.   Social History Main Topics  . Smoking status: Former Smoker -- 0.5 packs/day    Types: Cigarettes    Quit date: 01/10/1985  . Smokeless tobacco: Not on file  . Alcohol Use: No  . Drug Use: No  . Sexually Active: No   Other Topics Concern   . Not on file   Social History Narrative  . No narrative on file     PHYSICAL EXAM   BP 152/67  Pulse 70  Ht 5\' 7"  (1.702 m)  Wt 219 lb 1.9 oz (99.392 kg)  BMI 34.32 kg/m2 Constitutional: She is oriented to person, place, and time. She appears well-developed and well-nourished. No distress.  HENT: No nasal discharge.  Head: Normocephalic and atraumatic.  Eyes: Pupils are equal and round. Right eye exhibits no discharge. Left eye exhibits no discharge.  Neck: Normal range of motion. Neck supple. No JVD present. No thyromegaly present.  Cardiovascular: Normal rate, regular rhythm, normal heart sounds. Exam reveals no gallop and no friction rub. There is a 2/6 systolic ejection murmur at the aortic area which is mid peaking. Pulmonary/Chest: Effort normal and breath sounds normal. No stridor. No respiratory distress. She has no wheezes. She has no rales. She exhibits no tenderness.  Abdominal: Soft. Bowel sounds are normal. She exhibits no distension. There is no tenderness. There is no rebound and no guarding.  Musculoskeletal: Normal range of motion. She exhibits no edema and no tenderness.  Neurological: She is alert and oriented to person, place, and time. Coordination normal.  Skin: Skin is warm and dry. No rash noted. She is not diaphoretic. No erythema. No pallor.  Psychiatric: She has a normal mood and affect. Her behavior is normal. Judgment and thought content normal.  Vascular: Absent right femoral pulse. Pulses are normal on the left side.    ASSESSMENT AND PLAN

## 2011-09-14 NOTE — Patient Instructions (Addendum)
Your physician wants you to follow-up in: only if needed  Your physician has requested that you have an ankle brachial index (ABI) in 6 months.. During this test an ultrasound and blood pressure cuff are used to evaluate the arteries that supply the arms and legs with blood. Allow thirty minutes for this exam. There are no restrictions or special instructions.

## 2011-09-14 NOTE — Assessment & Plan Note (Addendum)
Occluded right common iliac artery. However, the patient reports no symptoms of claudication. Thus, I recommend continuing medical therapy. Continue aspirin daily and treatment with a statin. She is not a smoker. Repeat ABI in 6 months from now. I asked her to followup with me as needed if she develops claudication. I advised her to start an exercise program.

## 2011-10-07 ENCOUNTER — Other Ambulatory Visit: Payer: Self-pay | Admitting: Cardiology

## 2011-10-18 ENCOUNTER — Ambulatory Visit (INDEPENDENT_AMBULATORY_CARE_PROVIDER_SITE_OTHER): Payer: Medicare Other | Admitting: *Deleted

## 2011-10-18 DIAGNOSIS — I714 Abdominal aortic aneurysm, without rupture, unspecified: Secondary | ICD-10-CM

## 2011-10-18 DIAGNOSIS — I10 Essential (primary) hypertension: Secondary | ICD-10-CM

## 2011-10-18 DIAGNOSIS — I2510 Atherosclerotic heart disease of native coronary artery without angina pectoris: Secondary | ICD-10-CM

## 2011-10-18 DIAGNOSIS — Z720 Tobacco use: Secondary | ICD-10-CM

## 2011-10-18 DIAGNOSIS — F17200 Nicotine dependence, unspecified, uncomplicated: Secondary | ICD-10-CM

## 2011-10-18 LAB — BASIC METABOLIC PANEL
BUN: 15 mg/dL (ref 6–23)
Creatinine, Ser: 0.7 mg/dL (ref 0.4–1.2)
GFR: 85.33 mL/min (ref >60.00)
Glucose, Bld: 106 mg/dL — ABNORMAL HIGH (ref 70–99)

## 2011-10-18 LAB — HEPATIC FUNCTION PANEL
ALT: 13 U/L (ref 0–35)
Alkaline Phosphatase: 48 U/L (ref 39–117)
Bilirubin, Direct: 0.2 mg/dL (ref 0.0–0.3)
Total Bilirubin: 1 mg/dL (ref 0.3–1.2)
Total Protein: 6.9 g/dL (ref 6.0–8.3)

## 2011-10-18 LAB — LIPID PANEL
Cholesterol: 168 mg/dL (ref 0–200)
LDL Cholesterol: 86 mg/dL (ref 0–99)
VLDL: 25.6 mg/dL (ref 0.0–40.0)

## 2011-10-21 ENCOUNTER — Other Ambulatory Visit: Payer: Self-pay | Admitting: *Deleted

## 2011-10-21 ENCOUNTER — Encounter: Payer: Self-pay | Admitting: Cardiology

## 2011-10-21 ENCOUNTER — Other Ambulatory Visit: Payer: Medicare Other

## 2011-10-21 ENCOUNTER — Ambulatory Visit (INDEPENDENT_AMBULATORY_CARE_PROVIDER_SITE_OTHER): Payer: Medicare Other | Admitting: Cardiology

## 2011-10-21 VITALS — BP 130/88 | HR 66 | Ht 67.0 in | Wt 219.0 lb

## 2011-10-21 DIAGNOSIS — I739 Peripheral vascular disease, unspecified: Secondary | ICD-10-CM

## 2011-10-21 DIAGNOSIS — I714 Abdominal aortic aneurysm, without rupture: Secondary | ICD-10-CM

## 2011-10-21 DIAGNOSIS — I10 Essential (primary) hypertension: Secondary | ICD-10-CM

## 2011-10-21 DIAGNOSIS — I2510 Atherosclerotic heart disease of native coronary artery without angina pectoris: Secondary | ICD-10-CM

## 2011-10-21 MED ORDER — FLUOXETINE HCL 40 MG PO CAPS: 40.0000 mg | ORAL_CAPSULE | Freq: Every day | ORAL | Status: DC

## 2011-10-21 MED ORDER — ALPRAZOLAM 0.25 MG PO TABS: 0.2500 mg | ORAL_TABLET | Freq: Every evening | ORAL | Status: DC | PRN

## 2011-10-21 NOTE — Patient Instructions (Signed)
Continue your current medication.  Try and keep your weight down. Reduce you intake of simple carbohydrates ( sweets, rice, pasta, potatoes, and bread)  I will see you again in 6 months.

## 2011-10-21 NOTE — Progress Notes (Signed)
[name] Date of Birth: 1940/10/30   History of Present Illness: [name] is seen today for a followup visit.   [name] has known CAD with redo CABG.  [name] has a known abdominal aortic aneurysm with last ultrasound in February 2012 measuring this a 3.3 cm. by CT in April 2013 it was 3.6. [name] has a history of PAD with right iliac occlusion. [name] has mild claudication symptoms. Her last nuclear stress test in February showed mild anterior wall ischemia. This is unchanged from one year prior at which time the catheterization showed patent grafts. On followup today [name] denies any significant chest pain or shortness of breath. [name] quit smoking 8 months ago. [name] is exercising 2-3 days per week.  Current Outpatient Prescriptions on File Prior to Visit  Medication Sig Dispense Refill  . aspirin 325 MG tablet Take 325 mg by mouth daily.        [name] atenolol (TENORMIN) 50 MG tablet TAKE  ONE TABLET BY MOUTH NIGHTLY AT BEDTIME  30 tablet  11  . FLUoxetine (PROZAC) 40 MG capsule Take 1 capsule (40 mg total) by mouth daily.  90 capsule  3  . hydrochlorothiazide (HYDRODIURIL) 25 MG tablet Take 1 tablet (25 mg total) by mouth as needed.  30 tablet  4  . levothyroxine (SYNTHROID, LEVOTHROID) 50 MCG tablet TAKE ONE TABLET BY MOUTH ONE TIME DAILY  30 tablet  PRN  . nitroGLYCERIN (NITROSTAT) 0.4 MG SL tablet Place 1 tablet (0.4 mg total) under the tongue every 5 (five) minutes as needed.  25 tablet  11  . Omega-3 Fatty Acids (FISH OIL PO) Take 2 capsules by mouth daily.        . simvastatin (ZOCOR) 20 MG tablet Take 1 tablet (20 mg total) by mouth daily.  30 tablet  4    Allergies  Allergen Reactions  . Chantix (Varenicline Tartrate)   . Dipyridamole     Past Medical History  Diagnosis Date  . Hyperlipidemia   . Hypertension   . Ischemic heart disease     had redo bypass surgery in 2001, at that time [name] had a free right internal mammary to the LAD, LAD endarterectomy, sequential vein graft to the OM1 and  OM2 -- last catheterization was in 2008  . Hypothyroidism   . Obesity     with gastric bypass surgery in 2004  . History of tobacco abuse     Recurrent cigarette smoking  . Abdominal aortic aneurysm 2/12    infrarenal diagnosed with maximum measurement at 3.3 cm  . GERD (gastroesophageal reflux disease)   . Depression   . Leg fracture     remote right leg fracture  . PAD (peripheral artery disease) 04/13/2011    right iliac occlusion    Past Surgical History  Procedure Date  . Gastric bypass 2004  . Carpal tunnel release   . Coronary artery bypass graft 1985  . Coronary artery bypass graft 03/09/1999    Redo CABG x3 --  with the free right internal mammary artey to the LAD with an LAD endarterectomy -- Sequential saphenous vein graft to OM1 and OM2                   . Cardiac catheterization 02/16/1999    Left heart catheterization with selective coronary  angiography, left ventricular angiography (RAO and LAO views), angiography of left internal mammary artery, and angiography of saphenous vein grafts  . Cardiac catheterization 10/20/2006  Est. EF of 55% -- Totally occuladed native coronary circulation -- Persistent patency of the right mammary graft and the distal LAD artery with peristent patency of the left mammary graft to the proximal LAD artery with persistant patency of the saphenous vein graft to the sequentail branches of the OM          History  Smoking status  . Former Smoker -- 0.5 packs/day  . Types: Cigarettes  . Quit date: 01/10/1985  Smokeless tobacco  . Not on file    History  Alcohol Use No    Family History  Problem Relation Age of Onset  . Stroke Father   . Diabetes Mother   . Cancer Mother     pancreatic cancer    Review of Systems: The review of systems is as above.  All other systems were reviewed and are negative.  Physical Exam: BP 130/88  Pulse 66  Ht 5\' 7"  (1.702 m)  Wt 99.338 kg (219 lb)  BMI 34.30 kg/m2  SpO2 97% Patient is  very pleasant and in no acute distress. [name] is obese. Skin is warm and dry. Color is normal.  HEENT is unremarkable. Normocephalic/atraumatic. PERRL. Sclera are nonicteric. Neck is supple. No masses. No JVD. Lungs are clear. Cardiac exam shows a regular rate and rhythm. [name] has a soft outflow murmur noted today. Abdomen is obese but soft. Extremities are without edema. Pedal pulses are nonpalpable. Gait and ROM are intact. No gross neurologic deficits noted.   LABORATORY DATA: Lab Results  Component Value Date   CHOL 168 10/18/2011   HDL 56.50 10/18/2011   LDLCALC 86 10/18/2011   TRIG 128.0 10/18/2011   CHOLHDL 3 10/18/2011     Assessment / Plan: 1. Coronary disease status post CABG. Redo coronary bypass surgery in 2001 with sequential saphenous vein graft to the first and second obtuse marginal vessels and a free RIMA graft to the LAD. Prior LIMA graft to the proximal LAD. Myoview study earlier this year was stable. Continue aspirin, beta blocker, and statin therapy. Next  2. PAD with right iliac occlusion. Stable claudication.  3. Abdominal aortic aneurysm measuring 3.6 cm. Needs yearly followup.  4. Hypertension, controlled.  5. Tobacco abuse now stopped.  6. Elevated glucose. Fasting blood sugars have been consistently mildly elevated at 105-110. Recommend weight control with carbohydrate modified diet.

## 2011-10-25 ENCOUNTER — Other Ambulatory Visit: Payer: Self-pay | Admitting: Cardiology

## 2011-11-06 ENCOUNTER — Other Ambulatory Visit: Payer: Self-pay | Admitting: Cardiology

## 2012-02-26 ENCOUNTER — Other Ambulatory Visit: Payer: Self-pay | Admitting: Cardiology

## 2012-03-15 ENCOUNTER — Other Ambulatory Visit: Payer: Self-pay

## 2012-03-29 ENCOUNTER — Telehealth: Payer: Self-pay

## 2012-03-29 NOTE — Telephone Encounter (Signed)
Received refill request from [facility] for alprazolam.Advised to send to PCP.

## 2012-03-30 ENCOUNTER — Other Ambulatory Visit: Payer: Self-pay | Admitting: *Deleted

## 2012-03-30 ENCOUNTER — Other Ambulatory Visit: Payer: Self-pay | Admitting: Cardiology

## 2012-03-30 MED ORDER — ATENOLOL 50 MG PO TABS: 50.0000 mg | ORAL_TABLET | Freq: Every day | ORAL | Status: DC

## 2012-04-09 ENCOUNTER — Other Ambulatory Visit: Payer: Self-pay

## 2012-04-17 ENCOUNTER — Encounter (INDEPENDENT_AMBULATORY_CARE_PROVIDER_SITE_OTHER): Payer: Medicare Other

## 2012-04-17 DIAGNOSIS — I739 Peripheral vascular disease, unspecified: Secondary | ICD-10-CM

## 2012-04-17 DIAGNOSIS — I714 Abdominal aortic aneurysm, without rupture: Secondary | ICD-10-CM

## 2012-04-18 ENCOUNTER — Telehealth: Payer: Self-pay | Admitting: Cardiology

## 2012-04-18 NOTE — Telephone Encounter (Signed)
Message copied by Traye Bates, Griffin Dakin on Wed Apr 18, 2012  4:50 PM ------      Message from: Lorine Bears A      Created: Wed Apr 18, 2012  3:07 PM       Her ABI is stable and unchanged from before. ------

## 2012-04-19 ENCOUNTER — Ambulatory Visit: Payer: Medicare Other | Admitting: Cardiology

## 2012-04-26 ENCOUNTER — Telehealth: Payer: Self-pay

## 2012-04-26 NOTE — Telephone Encounter (Signed)
Received refill request from [facility] for alprazolam.Advised PCP needs to refill.

## 2012-06-27 ENCOUNTER — Ambulatory Visit: Payer: Medicare Other | Admitting: Cardiology

## 2012-09-05 ENCOUNTER — Ambulatory Visit: Payer: Medicare Other | Admitting: Cardiology

## 2012-09-07 ENCOUNTER — Other Ambulatory Visit: Payer: Self-pay

## 2012-09-07 MED ORDER — SIMVASTATIN 20 MG PO TABS: 20.0000 mg | ORAL_TABLET | Freq: Every day | ORAL | Status: DC

## 2012-10-18 ENCOUNTER — Encounter: Payer: Self-pay | Admitting: Cardiology

## 2012-10-18 ENCOUNTER — Ambulatory Visit (INDEPENDENT_AMBULATORY_CARE_PROVIDER_SITE_OTHER): Payer: Medicare Other | Admitting: Cardiology

## 2012-10-18 VITALS — BP 137/75 | HR 74 | Ht 67.0 in | Wt 221.0 lb

## 2012-10-18 DIAGNOSIS — I714 Abdominal aortic aneurysm, without rupture: Secondary | ICD-10-CM

## 2012-10-18 DIAGNOSIS — I2510 Atherosclerotic heart disease of native coronary artery without angina pectoris: Secondary | ICD-10-CM

## 2012-10-18 DIAGNOSIS — I739 Peripheral vascular disease, unspecified: Secondary | ICD-10-CM

## 2012-10-18 DIAGNOSIS — I10 Essential (primary) hypertension: Secondary | ICD-10-CM

## 2012-10-18 DIAGNOSIS — F17200 Nicotine dependence, unspecified, uncomplicated: Secondary | ICD-10-CM

## 2012-10-18 DIAGNOSIS — Z720 Tobacco use: Secondary | ICD-10-CM

## 2012-10-18 NOTE — Patient Instructions (Signed)
We will schedule you for fasting lab work  Continue your current therapy  No smoking.  I will see you in 6 months.

## 2012-10-18 NOTE — Progress Notes (Signed)
[name] Date of Birth: 05-04-1940   History of Present Illness: [name] is seen today for a followup visit.   She has known CAD with redo CABG.  She has a known abdominal aortic aneurysm with last ultrasound April 2014 showing a 3.6 cm aneurysm. She has a history of PAD with right iliac occlusion. She has chronic claudication symptoms. Her last nuclear stress test in February 2013 showed mild anterior wall ischemia. This was unchanged from one year prior at which time the catheterization showed patent grafts. On followup today she denies any significant chest pain or shortness of breath. She is walking regularly. She has largely quit smoking but still smokes occasionally when she gets anxious.  Current Outpatient Prescriptions on File Prior to Visit  Medication Sig Dispense Refill  . ALPRAZolam (XANAX) 0.25 MG tablet Take 1 tablet (0.25 mg total) by mouth at bedtime as needed for sleep.  30 tablet  2  . aspirin 325 MG tablet Take 325 mg by mouth daily.        [name] atenolol (TENORMIN) 50 MG tablet Take 1 tablet (50 mg total) by mouth at bedtime.  30 tablet  11  . Cholecalciferol (VITAMIN D PO) Take by mouth as directed.      [name] FLUoxetine (PROZAC) 40 MG capsule Take 1 capsule (40 mg total) by mouth daily.  90 capsule  3  . levothyroxine (SYNTHROID, LEVOTHROID) 50 MCG tablet TAKE ONE TABLET BY MOUTH ONE TIME DAILY  30 tablet  11  . nitroGLYCERIN (NITROSTAT) 0.4 MG SL tablet Place 1 tablet (0.4 mg total) under the tongue every 5 (five) minutes as needed.  25 tablet  11  . Omega-3 Fatty Acids (FISH OIL PO) Take 2 capsules by mouth daily.        . simvastatin (ZOCOR) 20 MG tablet Take 1 tablet (20 mg total) by mouth at bedtime.  30 tablet  1   No current facility-administered medications on file prior to visit.    Allergies  Allergen Reactions  . Chantix [Varenicline Tartrate]   . Dipyridamole     Past Medical History  Diagnosis Date  . Hyperlipidemia   . Hypertension   . Ischemic  heart disease     had redo bypass surgery in 2001, at that time she had a free right internal mammary to the LAD, LAD endarterectomy, sequential vein graft to the OM1 and OM2 -- last catheterization was in 2008  . Hypothyroidism   . Obesity     with gastric bypass surgery in 2004  . History of tobacco abuse     Recurrent cigarette smoking  . Abdominal aortic aneurysm 2/12    infrarenal diagnosed with maximum measurement at 3.3 cm  . GERD (gastroesophageal reflux disease)   . Depression   . Leg fracture     remote right leg fracture  . PAD (peripheral artery disease) 04/13/2011    right iliac occlusion    Past Surgical History  Procedure Laterality Date  . Gastric bypass  2004  . Carpal tunnel release    . Coronary artery bypass graft  1985  . Coronary artery bypass graft  03/09/1999    Redo CABG x3 --  with the free right internal mammary artey to the LAD with an LAD endarterectomy -- Sequential saphenous vein graft to OM1 and OM2                   . Cardiac catheterization  02/16/1999    Left  heart catheterization with selective coronary  angiography, left ventricular angiography (RAO and LAO views), angiography of left internal mammary artery, and angiography of saphenous vein grafts  . Cardiac catheterization  10/20/2006    Est. EF of 55% -- Totally occuladed native coronary circulation -- Persistent patency of the right mammary graft and the distal LAD artery with peristent patency of the left mammary graft to the proximal LAD artery with persistant patency of the saphenous vein graft to the sequentail branches of the OM          History  Smoking status  . Former Smoker -- 0.50 packs/day  . Types: Cigarettes  . Quit date: 01/10/1985  Smokeless tobacco  . Not on file    History  Alcohol Use No    Family History  Problem Relation Age of Onset  . Stroke Father   . Diabetes Mother   . Cancer Mother     pancreatic cancer    Review of Systems: The review of systems is  as above.  All other systems were reviewed and are negative.  Physical Exam: BP 137/75  Pulse 74  Ht 5\' 7"  (1.702 m)  Wt 221 lb (100.245 kg)  BMI 34.61 kg/m2 Patient is very pleasant and in no acute distress.  HEENT is unremarkable. Normocephalic/atraumatic. PERRL. Sclera are nonicteric. Neck is supple. No masses. No JVD. Lungs are clear. Cardiac exam shows a regular rate and rhythm. She has a soft outflow murmur noted today. Abdomen is obese but soft. Extremities are without edema. Pedal pulses are nonpalpable. Gait and ROM are intact. No gross neurologic deficits noted.   LABORATORY DATA: Lab Results  Component Value Date   CHOL 168 10/18/2011   HDL 56.50 10/18/2011   LDLCALC 86 10/18/2011   TRIG 128.0 10/18/2011   CHOLHDL 3 10/18/2011   ECG today demonstrates normal sinus rhythm with LVH by voltage. Poor R wave progression across the precordial leads.  Assessment / Plan: 1. Coronary disease status post CABG. Redo coronary bypass surgery in 2001 with sequential saphenous vein graft to the first and second obtuse marginal vessels and a free RIMA graft to the LAD. Prior LIMA graft to the proximal LAD. Myoview study February 2013 was stable. Continue aspirin, beta blocker, and statin therapy.   2. PAD with right iliac occlusion. Stable claudication.  3. Abdominal aortic aneurysm measuring 3.6 cm. Needs yearly followup.  4. Hypertension, controlled.  5. Tobacco abuse-encouraged complete cessation.

## 2012-10-19 ENCOUNTER — Other Ambulatory Visit (INDEPENDENT_AMBULATORY_CARE_PROVIDER_SITE_OTHER): Payer: Medicare Other

## 2012-10-19 DIAGNOSIS — I10 Essential (primary) hypertension: Secondary | ICD-10-CM

## 2012-10-19 DIAGNOSIS — I739 Peripheral vascular disease, unspecified: Secondary | ICD-10-CM

## 2012-10-19 DIAGNOSIS — I2510 Atherosclerotic heart disease of native coronary artery without angina pectoris: Secondary | ICD-10-CM

## 2012-10-19 DIAGNOSIS — I714 Abdominal aortic aneurysm, without rupture: Secondary | ICD-10-CM

## 2012-10-19 DIAGNOSIS — Z720 Tobacco use: Secondary | ICD-10-CM

## 2012-10-19 DIAGNOSIS — F17200 Nicotine dependence, unspecified, uncomplicated: Secondary | ICD-10-CM

## 2012-10-19 LAB — CBC WITH DIFFERENTIAL/PLATELET
Basophils Relative: 0.5 % (ref 0.0–3.0)
Eosinophils Absolute: 0.2 10*3/uL (ref 0.0–0.7)
Eosinophils Relative: 3.5 % (ref 0.0–5.0)
HCT: 45.3 % (ref 36.0–46.0)
Hemoglobin: 15.3 g/dL — ABNORMAL HIGH (ref 12.0–15.0)
Lymphs Abs: 2.5 10*3/uL (ref 0.7–4.0)
MCHC: 33.8 g/dL (ref 30.0–36.0)
MCV: 96.1 fl (ref 78.0–100.0)
Monocytes Absolute: 0.6 10*3/uL (ref 0.1–1.0)
Neutro Abs: 3.6 10*3/uL (ref 1.4–7.7)
Neutrophils Relative %: 51.2 % (ref 43.0–77.0)
RBC: 4.71 Mil/uL (ref 3.87–5.11)
WBC: 7 10*3/uL (ref 4.5–10.5)

## 2012-10-19 LAB — BASIC METABOLIC PANEL
CO2: 29 mEq/L (ref 19–32)
Chloride: 104 mEq/L (ref 96–112)
Creatinine, Ser: 0.8 mg/dL (ref 0.4–1.2)
Potassium: 4.5 mEq/L (ref 3.5–5.1)

## 2012-10-19 LAB — HEPATIC FUNCTION PANEL
Albumin: 3.6 g/dL (ref 3.5–5.2)
Alkaline Phosphatase: 51 U/L (ref 39–117)
Total Protein: 6.7 g/dL (ref 6.0–8.3)

## 2012-10-19 LAB — LIPID PANEL
Cholesterol: 189 mg/dL (ref 0–200)
LDL Cholesterol: 95 mg/dL (ref 0–99)
Total CHOL/HDL Ratio: 3
Triglycerides: 143 mg/dL (ref 0.0–149.0)
VLDL: 28.6 mg/dL (ref 0.0–40.0)

## 2012-10-23 ENCOUNTER — Telehealth: Payer: Self-pay | Admitting: Cardiology

## 2012-10-23 DIAGNOSIS — I2510 Atherosclerotic heart disease of native coronary artery without angina pectoris: Secondary | ICD-10-CM

## 2012-10-23 MED ORDER — SIMVASTATIN 40 MG PO TABS: 40.0000 mg | ORAL_TABLET | Freq: Every day | ORAL | Status: DC

## 2012-10-23 NOTE — Telephone Encounter (Signed)
Follow up    Pt returning your call for lab results.

## 2012-10-23 NOTE — Telephone Encounter (Signed)
Returned call to patient lab results given.Increase Zocor to 40 mg daily.Repeat fasting lipids and hepatic panels in 3 months.

## 2012-11-15 ENCOUNTER — Other Ambulatory Visit: Payer: Self-pay

## 2012-11-24 ENCOUNTER — Other Ambulatory Visit: Payer: Self-pay | Admitting: Nurse Practitioner

## 2012-11-27 ENCOUNTER — Other Ambulatory Visit: Payer: Self-pay

## 2012-11-27 MED ORDER — LEVOTHYROXINE SODIUM 50 MCG PO TABS: ORAL_TABLET | ORAL | Status: DC

## 2013-01-29 ENCOUNTER — Other Ambulatory Visit (INDEPENDENT_AMBULATORY_CARE_PROVIDER_SITE_OTHER): Payer: Medicare Other

## 2013-01-29 DIAGNOSIS — I2510 Atherosclerotic heart disease of native coronary artery without angina pectoris: Secondary | ICD-10-CM

## 2013-01-29 LAB — HEPATIC FUNCTION PANEL
ALBUMIN: 3.5 g/dL (ref 3.5–5.2)
ALT: 13 U/L (ref 0–35)
AST: 20 U/L (ref 0–37)
Alkaline Phosphatase: 44 U/L (ref 39–117)
BILIRUBIN TOTAL: 0.7 mg/dL (ref 0.3–1.2)
Bilirubin, Direct: 0.1 mg/dL (ref 0.0–0.3)
Total Protein: 6.8 g/dL (ref 6.0–8.3)

## 2013-01-29 LAB — LIPID PANEL
Cholesterol: 148 mg/dL (ref 0–200)
HDL: 67.8 mg/dL (ref >39.00)
LDL Cholesterol: 63 mg/dL (ref 0–99)
TRIGLYCERIDES: 84 mg/dL (ref 0.0–149.0)
Total CHOL/HDL Ratio: 2
VLDL: 16.8 mg/dL (ref 0.0–40.0)

## 2013-04-11 ENCOUNTER — Other Ambulatory Visit: Payer: Self-pay

## 2013-04-11 MED ORDER — ATENOLOL 50 MG PO TABS: 50.0000 mg | ORAL_TABLET | Freq: Every day | ORAL | Status: DC

## 2013-04-16 ENCOUNTER — Other Ambulatory Visit (HOSPITAL_COMMUNITY): Payer: Self-pay | Admitting: *Deleted

## 2013-04-16 DIAGNOSIS — I714 Abdominal aortic aneurysm, without rupture, unspecified: Secondary | ICD-10-CM

## 2013-04-24 ENCOUNTER — Ambulatory Visit (HOSPITAL_COMMUNITY): Payer: Medicare Other | Attending: Internal Medicine | Admitting: *Deleted

## 2013-04-24 ENCOUNTER — Encounter: Payer: Self-pay | Admitting: Internal Medicine

## 2013-04-24 DIAGNOSIS — I10 Essential (primary) hypertension: Secondary | ICD-10-CM | POA: Insufficient documentation

## 2013-04-24 DIAGNOSIS — Z87891 Personal history of nicotine dependence: Secondary | ICD-10-CM | POA: Insufficient documentation

## 2013-04-24 DIAGNOSIS — E785 Hyperlipidemia, unspecified: Secondary | ICD-10-CM | POA: Insufficient documentation

## 2013-04-24 DIAGNOSIS — I714 Abdominal aortic aneurysm, without rupture, unspecified: Secondary | ICD-10-CM | POA: Insufficient documentation

## 2013-04-24 DIAGNOSIS — I2510 Atherosclerotic heart disease of native coronary artery without angina pectoris: Secondary | ICD-10-CM | POA: Insufficient documentation

## 2013-04-24 DIAGNOSIS — I700 Atherosclerosis of aorta: Secondary | ICD-10-CM

## 2013-04-24 DIAGNOSIS — Z951 Presence of aortocoronary bypass graft: Secondary | ICD-10-CM | POA: Insufficient documentation

## 2013-04-24 DIAGNOSIS — I708 Atherosclerosis of other arteries: Secondary | ICD-10-CM | POA: Insufficient documentation

## 2013-04-24 NOTE — Progress Notes (Signed)
Aorta duplex complete 

## 2013-05-27 ENCOUNTER — Ambulatory Visit (INDEPENDENT_AMBULATORY_CARE_PROVIDER_SITE_OTHER): Payer: Medicare Other | Admitting: Cardiology

## 2013-05-27 ENCOUNTER — Encounter: Payer: Self-pay | Admitting: Cardiology

## 2013-05-27 VITALS — BP 132/80 | HR 70 | Ht 67.0 in | Wt 229.4 lb

## 2013-05-27 DIAGNOSIS — F17200 Nicotine dependence, unspecified, uncomplicated: Secondary | ICD-10-CM

## 2013-05-27 DIAGNOSIS — I2510 Atherosclerotic heart disease of native coronary artery without angina pectoris: Secondary | ICD-10-CM

## 2013-05-27 DIAGNOSIS — I10 Essential (primary) hypertension: Secondary | ICD-10-CM

## 2013-05-27 DIAGNOSIS — I714 Abdominal aortic aneurysm, without rupture, unspecified: Secondary | ICD-10-CM

## 2013-05-27 DIAGNOSIS — I739 Peripheral vascular disease, unspecified: Secondary | ICD-10-CM

## 2013-05-27 DIAGNOSIS — Z720 Tobacco use: Secondary | ICD-10-CM

## 2013-05-27 NOTE — Patient Instructions (Signed)
Continue your medical therapy  You need to concentrate on weight loss, regular aerobic exercise, and weight loss. There are no shortcuts.   Discuss with Dr. Melford Aase taking Wellbutrin.  I will see you in 6 months.

## 2013-05-27 NOTE — Progress Notes (Signed)
[name] Date of Birth: Jun 06, 1940   History of Present Illness: [name] is seen today for a followup visit.   She has known CAD with redo CABG.  She has a known abdominal aortic aneurysm with last ultrasound  showing a 3.0 cm aneurysm. She has a history of PAD with right iliac occlusion. She has chronic claudication symptoms. Her last nuclear stress test in February 2013 showed mild anterior wall ischemia. This was unchanged from one year prior at which time the catheterization showed patent grafts. On followup today she denies any significant chest pain or shortness of breath. She continues to smoke. Is interested in taking Wellbutrin. Intolerant to Chantix in the past. Not walking much and has gained 8 lbs.   Current Outpatient Prescriptions on File Prior to Visit  Medication Sig Dispense Refill  . aspirin 325 MG tablet Take 325 mg by mouth daily.        [name] atenolol (TENORMIN) 50 MG tablet Take 1 tablet (50 mg total) by mouth at bedtime.  30 tablet  3  . Cholecalciferol (VITAMIN D PO) Take by mouth as directed.      [name] FLUoxetine (PROZAC) 40 MG capsule Take one capsule by mouth one time daily  90 capsule  1  . hydrochlorothiazide (HYDRODIURIL) 25 MG tablet Take 25 mg by mouth. prn      . levothyroxine (SYNTHROID, LEVOTHROID) 50 MCG tablet TAKE ONE TABLET BY MOUTH ONE TIME DAILY  30 tablet  6  . nitroGLYCERIN (NITROSTAT) 0.4 MG SL tablet Place 1 tablet (0.4 mg total) under the tongue every 5 (five) minutes as needed.  25 tablet  11  . Omega-3 Fatty Acids (FISH OIL PO) Take 2 capsules by mouth daily.        . simvastatin (ZOCOR) 40 MG tablet Take 1 tablet (40 mg total) by mouth at bedtime.  90 tablet  3   No current facility-administered medications on file prior to visit.    Allergies  Allergen Reactions  . Chantix [Varenicline Tartrate]   . Dipyridamole     Past Medical History  Diagnosis Date  . Hyperlipidemia   . Hypertension   . Ischemic heart disease     had redo bypass  surgery in 2001, at that time she had a free right internal mammary to the LAD, LAD endarterectomy, sequential vein graft to the OM1 and OM2 -- last catheterization was in 2008  . Hypothyroidism   . Obesity     with gastric bypass surgery in 2004  . History of tobacco abuse     Recurrent cigarette smoking  . Abdominal aortic aneurysm 2/12    infrarenal diagnosed with maximum measurement at 3.3 cm  . GERD (gastroesophageal reflux disease)   . Depression   . Leg fracture     remote right leg fracture  . PAD (peripheral artery disease) 04/13/2011    right iliac occlusion    Past Surgical History  Procedure Laterality Date  . Gastric bypass  2004  . Carpal tunnel release    . Coronary artery bypass graft  1985  . Coronary artery bypass graft  03/09/1999    Redo CABG x3 --  with the free right internal mammary artey to the LAD with an LAD endarterectomy -- Sequential saphenous vein graft to OM1 and OM2                   . Cardiac catheterization  02/16/1999    Left heart catheterization with selective  coronary  angiography, left ventricular angiography (RAO and LAO views), angiography of left internal mammary artery, and angiography of saphenous vein grafts  . Cardiac catheterization  10/20/2006    Est. EF of 55% -- Totally occuladed native coronary circulation -- Persistent patency of the right mammary graft and the distal LAD artery with peristent patency of the left mammary graft to the proximal LAD artery with persistant patency of the saphenous vein graft to the sequentail branches of the OM          History  Smoking status  . Former Smoker -- 0.50 packs/day  . Types: Cigarettes  . Quit date: 01/10/1985  Smokeless tobacco  . Not on file    History  Alcohol Use No    Family History  Problem Relation Age of Onset  . Stroke Father   . Diabetes Mother   . Cancer Mother     pancreatic cancer    Review of Systems: The review of systems is as above.  All other systems were  reviewed and are negative.  Physical Exam: BP 132/80  Pulse 70  Ht 5\' 7"  (1.702 m)  Wt 229 lb 6.4 oz (104.055 kg)  BMI 35.92 kg/m2 Patient is very pleasant and in no acute distress.  HEENT is unremarkable. Normocephalic/atraumatic. PERRL. Sclera are nonicteric. Neck is supple. No masses. No JVD. Lungs are clear. Cardiac exam shows a regular rate and rhythm. She has a soft 1-2/6 outflow murmur noted today. Abdomen is obese but soft. Extremities are without edema. Pedal pulses are nonpalpable. Gait and ROM are intact. No gross neurologic deficits noted.   LABORATORY DATA: Lab Results  Component Value Date   CHOL 148 01/29/2013   HDL 67.80 01/29/2013   LDLCALC 63 01/29/2013   TRIG 84.0 01/29/2013   CHOLHDL 2 01/29/2013     Assessment / Plan: 1. Coronary disease status post CABG. Redo coronary bypass surgery in 2001 with sequential saphenous vein graft to the first and second obtuse marginal vessels and a free RIMA graft to the LAD. Prior LIMA graft to the proximal LAD. Myoview study February 2013 was stable. Asymptomatic. Continue aspirin, beta blocker, and statin therapy.   2. PAD with right iliac occlusion. Stable claudication.  3. Abdominal aortic aneurysm measuring 3.0 cm. Needs yearly followup.  4. Hypertension, controlled.  5. Tobacco abuse-encouraged complete cessation.  6. Obesity.  We had a discussion about improving her lifestyle with a Mediterranean diet, smoking cessation and increased aerobic activity. She needs to discuss with primary care taking Wellbutrin since she is already on Prozac.

## 2013-11-01 ENCOUNTER — Other Ambulatory Visit: Payer: Self-pay | Admitting: *Deleted

## 2013-11-01 MED ORDER — SIMVASTATIN 40 MG PO TABS: 40.0000 mg | ORAL_TABLET | Freq: Every day | ORAL | Status: DC

## 2013-12-03 ENCOUNTER — Ambulatory Visit (INDEPENDENT_AMBULATORY_CARE_PROVIDER_SITE_OTHER): Payer: Medicare Other | Admitting: Cardiology

## 2013-12-03 ENCOUNTER — Encounter: Payer: Self-pay | Admitting: Cardiology

## 2013-12-03 VITALS — BP 140/84 | HR 63 | Ht 67.0 in | Wt 224.5 lb

## 2013-12-03 DIAGNOSIS — I25708 Atherosclerosis of coronary artery bypass graft(s), unspecified, with other forms of angina pectoris: Secondary | ICD-10-CM

## 2013-12-03 DIAGNOSIS — I10 Essential (primary) hypertension: Secondary | ICD-10-CM

## 2013-12-03 DIAGNOSIS — Z720 Tobacco use: Secondary | ICD-10-CM

## 2013-12-03 DIAGNOSIS — I350 Nonrheumatic aortic (valve) stenosis: Secondary | ICD-10-CM

## 2013-12-03 DIAGNOSIS — I739 Peripheral vascular disease, unspecified: Secondary | ICD-10-CM

## 2013-12-03 DIAGNOSIS — I714 Abdominal aortic aneurysm, without rupture, unspecified: Secondary | ICD-10-CM

## 2013-12-03 MED ORDER — BUPROPION HCL ER (XL) 150 MG PO TB24: 150.0000 mg | ORAL_TABLET | Freq: Two times a day (BID) | ORAL | Status: DC

## 2013-12-03 NOTE — Patient Instructions (Signed)
Take Wellbutrin 150 mg daily for 3 days then twice a day. Stop smoking.  Continue your other therapy  We will schedule you for a stress test in January with fasting lab work  We will schedule you for lower extremity arterial dopplers and abdominal ultrasound in April.

## 2013-12-03 NOTE — Progress Notes (Signed)
[name] Date of Birth: 28-Jan-1940   History of Present Illness: [name] is seen today for follow up CAD.   She has known CAD with redo CABG in 2001.  She has a known abdominal aortic aneurysm with last ultrasound  showing a 3.0 cm aneurysm. She has a history of PAD with right iliac occlusion. She has chronic claudication symptoms. Her last nuclear stress test in February 2013 showed mild anterior wall ischemia. This was unchanged from one year prior at which time the catheterization showed patent grafts. On followup today she denies chest pain or SOB. 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 She continues to smoke. Since her last visit she did have cataract surgery with no complications.  Current Outpatient Prescriptions on File Prior to Visit  Medication Sig Dispense Refill  . aspirin 325 MG tablet Take 325 mg by mouth daily.      [name] atenolol (TENORMIN) 50 MG tablet Take 1 tablet (50 mg total) by mouth at bedtime. 30 tablet 3  . Cholecalciferol (VITAMIN D PO) Take by mouth as directed.    [name] FLUoxetine (PROZAC) 40 MG capsule Take one capsule by mouth one time daily 90 capsule 1  . hydrochlorothiazide (HYDRODIURIL) 25 MG tablet Take 25 mg by mouth. prn    . levothyroxine (SYNTHROID, LEVOTHROID) 50 MCG tablet TAKE ONE TABLET BY MOUTH ONE TIME DAILY 30 tablet 6  . nitroGLYCERIN (NITROSTAT) 0.4 MG SL tablet Place 1 tablet (0.4 mg total) under the tongue every 5 (five) minutes as needed. 25 tablet 11  . Omega-3 Fatty Acids (FISH OIL PO) Take 2 capsules by mouth daily.      . simvastatin (ZOCOR) 40 MG tablet Take 1 tablet (40 mg total) by mouth at bedtime. 90 tablet 3   No current facility-administered medications on file prior to visit.    Allergies  Allergen Reactions  . Chantix [Varenicline Tartrate]   . Dipyridamole     Past Medical History  Diagnosis Date  . Hyperlipidemia   . Hypertension   . Ischemic heart disease     had redo bypass  surgery in 2001, at that time she had a free right internal mammary to the LAD, LAD endarterectomy, sequential vein graft to the OM1 and OM2 -- last catheterization was in 2008  . Hypothyroidism   . Obesity     with gastric bypass surgery in 2004  . History of tobacco abuse     Recurrent cigarette smoking  . Abdominal aortic aneurysm 2/12    infrarenal diagnosed with maximum measurement at 3.3 cm  . GERD (gastroesophageal reflux disease)   . Depression   . Leg fracture     remote right leg fracture  . PAD (peripheral artery disease) 04/13/2011    right iliac occlusion    Past Surgical History  Procedure Laterality Date  . Gastric bypass  2004  . Carpal tunnel release    . Coronary artery bypass graft  1985  . Coronary artery bypass graft  03/09/1999    Redo CABG x3 --  with the free right internal mammary artey to the LAD with an LAD endarterectomy -- Sequential saphenous vein graft to OM1 and OM2                   . Cardiac catheterization  02/16/1999    Left heart catheterization with selective coronary  angiography, left ventricular angiography (RAO and LAO views), angiography of left  internal mammary artery, and angiography of saphenous vein grafts  . Cardiac catheterization  10/20/2006    Est. EF of 55% -- Totally occuladed native coronary circulation -- Persistent patency of the right mammary graft and the distal LAD artery with peristent patency of the left mammary graft to the proximal LAD artery with persistant patency of the saphenous vein graft to the sequentail branches of the OM          History  Smoking status  . Former Smoker -- 0.50 packs/day  . Types: Cigarettes  . Quit date: 01/10/1985  Smokeless tobacco  . Not on file    History  Alcohol Use No    Family History  Problem Relation Age of Onset  . Stroke Father   . Diabetes Mother   . Cancer Mother     pancreatic cancer    Review of Systems: The review of systems is as above.  All other systems were  reviewed and are negative.  Physical Exam: BP 140/84 mmHg  Pulse 63  Ht 5\' 7"  (1.702 m)  Wt 224 lb 8 oz (101.833 kg)  BMI 35.15 kg/m2 Patient is an obese WF in no acute distress.  HEENT is unremarkable. Normocephalic/atraumatic. PERRL. Sclera are nonicteric. Neck is supple. No masses. No JVD. Lungs are clear. Cardiac exam shows a regular rate and rhythm. She has a soft 1-2/6 outflow murmur noted today. Abdomen is obese but soft. Extremities are without edema. Pedal pulses are nonpalpable. Gait and ROM are intact. No gross neurologic deficits noted.   LABORATORY DATA: Lab Results  Component Value Date   CHOL 148 01/29/2013   HDL 67.80 01/29/2013   LDLCALC 63 01/29/2013   TRIG 84.0 01/29/2013   CHOLHDL 2 01/29/2013     Assessment / Plan: 1. Coronary disease status post CABG. Redo coronary bypass surgery in 2001 with sequential saphenous vein graft to the first and second obtuse marginal vessels and a free RIMA graft to the LAD. Prior LIMA graft to the proximal LAD. Myoview study February 2013 was stable. Cardiac cath in 2008 showed patent grafts. Asymptomatic. Continue aspirin, beta blocker, and statin therapy. Plan Leane Call in January.  2. PAD with right iliac occlusion. Stable claudication. Follow up LE arterial dopplers in April 2015.  3. Abdominal aortic aneurysm measuring 3.0 cm. Follow up April 2015  4. Hypertension, controlled.  5. Tobacco abuse-encouraged complete cessation. Will prescribe Wellbutrin 150 mg bid maximum of 4 months.. She states she has taken this with Prozac in the past with no problem.   6. Obesity.

## 2013-12-14 ENCOUNTER — Other Ambulatory Visit: Payer: Self-pay | Admitting: Nurse Practitioner

## 2013-12-24 ENCOUNTER — Other Ambulatory Visit: Payer: Self-pay | Admitting: Nurse Practitioner

## 2014-01-24 ENCOUNTER — Other Ambulatory Visit: Payer: Self-pay | Admitting: *Deleted

## 2014-01-24 MED ORDER — LEVOTHYROXINE SODIUM 50 MCG PO TABS: ORAL_TABLET | ORAL | Status: DC

## 2014-01-24 NOTE — Telephone Encounter (Signed)
Please advise on refill. Thanks, MI 

## 2014-01-30 ENCOUNTER — Telehealth (HOSPITAL_COMMUNITY): Payer: Self-pay

## 2014-01-30 NOTE — Telephone Encounter (Signed)
Encounter complete. 

## 2014-02-04 ENCOUNTER — Ambulatory Visit (HOSPITAL_COMMUNITY)
Admission: RE | Admit: 2014-02-04 | Discharge: 2014-02-04 | Disposition: A | Discharge status: Home / Self Care | Payer: Medicare Other | Source: Ambulatory Visit | Attending: Cardiology | Admitting: Cardiology

## 2014-02-04 DIAGNOSIS — I739 Peripheral vascular disease, unspecified: Secondary | ICD-10-CM

## 2014-02-04 DIAGNOSIS — I714 Abdominal aortic aneurysm, without rupture, unspecified: Secondary | ICD-10-CM

## 2014-02-04 DIAGNOSIS — Z720 Tobacco use: Secondary | ICD-10-CM | POA: Insufficient documentation

## 2014-02-04 DIAGNOSIS — Z951 Presence of aortocoronary bypass graft: Secondary | ICD-10-CM | POA: Diagnosis not present

## 2014-02-04 DIAGNOSIS — R0609 Other forms of dyspnea: Secondary | ICD-10-CM | POA: Diagnosis not present

## 2014-02-04 DIAGNOSIS — I2510 Atherosclerotic heart disease of native coronary artery without angina pectoris: Secondary | ICD-10-CM | POA: Diagnosis not present

## 2014-02-04 DIAGNOSIS — I350 Nonrheumatic aortic (valve) stenosis: Secondary | ICD-10-CM | POA: Diagnosis not present

## 2014-02-04 DIAGNOSIS — I10 Essential (primary) hypertension: Secondary | ICD-10-CM | POA: Insufficient documentation

## 2014-02-04 DIAGNOSIS — E669 Obesity, unspecified: Secondary | ICD-10-CM | POA: Diagnosis not present

## 2014-02-04 DIAGNOSIS — I25708 Atherosclerosis of coronary artery bypass graft(s), unspecified, with other forms of angina pectoris: Secondary | ICD-10-CM

## 2014-02-04 DIAGNOSIS — Z6835 Body mass index (BMI) 35.0-35.9, adult: Secondary | ICD-10-CM | POA: Diagnosis not present

## 2014-02-04 LAB — HEPATIC FUNCTION PANEL
ALBUMIN: 3.7 g/dL (ref 3.5–5.2)
ALT: 14 U/L (ref 0–35)
AST: 17 U/L (ref 0–37)
Alkaline Phosphatase: 50 U/L (ref 39–117)
BILIRUBIN DIRECT: 0.2 mg/dL (ref 0.0–0.3)
Indirect Bilirubin: 0.6 mg/dL (ref 0.2–1.2)
TOTAL PROTEIN: 6.4 g/dL (ref 6.0–8.3)
Total Bilirubin: 0.8 mg/dL (ref 0.2–1.2)

## 2014-02-04 LAB — BASIC METABOLIC PANEL
BUN: 13 mg/dL (ref 6–23)
CHLORIDE: 106 meq/L (ref 96–112)
CO2: 26 meq/L (ref 19–32)
CREATININE: 0.75 mg/dL (ref 0.50–1.10)
Calcium: 9.5 mg/dL (ref 8.4–10.5)
Glucose, Bld: 97 mg/dL (ref 70–99)
POTASSIUM: 4.7 meq/L (ref 3.5–5.3)
SODIUM: 140 meq/L (ref 135–145)

## 2014-02-04 LAB — LIPID PANEL
CHOLESTEROL: 149 mg/dL (ref 0–200)
HDL: 60 mg/dL (ref >39)
LDL Cholesterol: 64 mg/dL (ref 0–99)
TRIGLYCERIDES: 124 mg/dL (ref <150)
Total CHOL/HDL Ratio: 2.5 Ratio
VLDL: 25 mg/dL (ref 0–40)

## 2014-02-04 MED ORDER — TECHNETIUM TC 99M SESTAMIBI GENERIC - CARDIOLITE
31.0000 | Freq: Once | INTRAVENOUS | Status: AC | PRN
  Administered 2014-02-04: 31 via INTRAVENOUS

## 2014-02-04 MED ORDER — TECHNETIUM TC 99M SESTAMIBI GENERIC - CARDIOLITE
10.7000 | Freq: Once | INTRAVENOUS | Status: AC | PRN
  Administered 2014-02-04: 11 via INTRAVENOUS

## 2014-02-04 MED ORDER — REGADENOSON 0.4 MG/5ML IV SOLN
0.4000 mg | Freq: Once | INTRAVENOUS | Status: AC
  Administered 2014-02-04: 0.4 mg via INTRAVENOUS

## 2014-02-04 NOTE — Procedures (Addendum)
[facility] [address] [place] [address] [phone]  Cardiology Nuclear Med Study  [name] is a 74 y.o. female     MRN : [id number]     DOB: 1940/05/10  Procedure Date: 02/04/2014  Nuclear Med Background Indication for Stress Test:  Follow up CAD History:  CAD;CABG in 1985 and a redo in 2001Last NUC MPI on 03/08/2011-anterior ischemia;EF=67%;PVD;AAA;R ILIAC 100% BLOCKED; Cardiac Risk Factors: History of Smoking, Lipids, Obesity, PVD and Smoker  Symptoms:  DOE   Nuclear Pre-Procedure Caffeine/Decaff Intake:  9:00pm NPO After: 7:00am   IV Site: R Forearm  IV 0.9% NS with Angio Cath:  22g  Chest Size (in):  n/a IV Started by: Rolene Course, RN  Height: 5\' 7"  (1.702 m)  Cup Size: G  BMI:  Body mass index is 35.08 kg/(m^2). Weight:  224 lb (101.606 kg)   Tech Comments:  n/a    Nuclear Med Study 1 or 2 day study: 1 day  Stress Test Type:  Ali Molina Provider:  Peter Martinique, MD   Resting Radionuclide: Technetium 66m Sestamibi  Resting Radionuclide Dose: 10.7 mCi   Stress Radionuclide:  Technetium 74m Sestamibi  Stress Radionuclide Dose: 31.0 mCi           Stress Protocol Rest HR: 63 Stress HR: 77  Rest BP: 152/100 Stress BP: 170/80  Exercise Time (min): n/a METS: n/a          Dose of Adenosine (mg):  n/a Dose of Lexiscan: 0.4 mg  Dose of Atropine (mg): n/a Dose of Dobutamine: n/a mcg/kg/min (at max HR)  Stress Test Technologist: Mellody Memos, CCT Nuclear Technologist: Imagene Riches, CNMT   Rest Procedure:  Myocardial perfusion imaging was performed at rest 45 minutes following the intravenous administration of Technetium 46m Sestamibi. Stress Procedure:  The patient received IV Lexiscan 0.4 mg over 15-seconds.  Technetium 72m Sestamibi injected IV at 30-seconds.  There were no significant changes with Lexiscan.  Quantitative spect images were obtained after a 45 minute  delay.  Transient Ischemic Dilatation (Normal <1.22):  1.04:   QGS EDV:  85 ml QGS ESV:  36 ml LV Ejection Fraction: 57%        Rest ECG: NSR - Normal EKG  Stress ECG: No significant change from baseline ECG  QPS Raw Data Images:  Normal; no motion artifact; normal heart/lung ratio. Stress Images:  There is decreased uptake in the anterior wall. Rest Images:  Normal homogeneous uptake in all areas of the myocardium. Subtraction (SDS):  These findings are consistent with ischemia.  Impression Exercise Capacity:  Lexiscan with no exercise. BP Response:  Normal blood pressure response. Clinical Symptoms:  No significant symptoms noted. ECG Impression:  No significant ST segment change suggestive of ischemia. Comparison with Prior Nuclear Study: No significant change from previous study  Overall Impression:  Intermediate risk stress nuclear study Anterior , apical and septal ischemia, mild to mod in intensity, moderate in area.  LV Wall Motion:  NL LV Function; NL Wall Motion   Lorretta Harp, MD  02/04/2014 2:45 PM

## 2014-02-06 ENCOUNTER — Other Ambulatory Visit: Payer: Self-pay | Admitting: *Deleted

## 2014-02-06 MED ORDER — ATENOLOL 50 MG PO TABS: 50.0000 mg | ORAL_TABLET | Freq: Every day | ORAL | Status: DC

## 2014-03-19 ENCOUNTER — Other Ambulatory Visit: Payer: Self-pay

## 2014-04-02 ENCOUNTER — Other Ambulatory Visit: Payer: Self-pay

## 2014-04-03 ENCOUNTER — Other Ambulatory Visit: Payer: Self-pay | Admitting: Nurse Practitioner

## 2014-04-04 ENCOUNTER — Telehealth: Payer: Self-pay

## 2014-04-04 NOTE — Telephone Encounter (Signed)
Could not get an answer next refill of prozac needs to come from PCP

## 2014-04-08 ENCOUNTER — Other Ambulatory Visit: Payer: Self-pay | Admitting: *Deleted

## 2014-04-08 MED ORDER — LEVOTHYROXINE SODIUM 50 MCG PO TABS: ORAL_TABLET | ORAL | Status: DC

## 2014-04-22 ENCOUNTER — Encounter (HOSPITAL_COMMUNITY): Payer: Self-pay | Admitting: *Deleted

## 2014-04-22 ENCOUNTER — Telehealth: Payer: Self-pay | Admitting: Cardiology

## 2014-04-22 ENCOUNTER — Other Ambulatory Visit: Payer: Self-pay

## 2014-04-22 DIAGNOSIS — E038 Other specified hypothyroidism: Secondary | ICD-10-CM

## 2014-04-22 MED ORDER — LEVOTHYROXINE SODIUM 50 MCG PO TABS: ORAL_TABLET | ORAL | Status: DC

## 2014-04-22 NOTE — Telephone Encounter (Signed)
Returned call to patient she stated we refused to refill her Synthroid.Stated Dr.Tennant use to refill for her.Stated she does not see PCP regularly.Advised I will send in refill to pharmacy.Dr.Jordan out of office.I will speak to him next week when he is back in office and call you back.

## 2014-04-22 NOTE — Telephone Encounter (Signed)
Please call,she wants to talk to you about her thyroid medicine.

## 2014-04-28 NOTE — Telephone Encounter (Signed)
Returned call to patient Dr.Jordan advised he will refill Synthroid.Advised will need a TSH this week.Stated she will have done at [facility] lab 04/29/14.

## 2014-04-28 NOTE — Addendum Note (Signed)
Addended by: Golden Hurter D on: 04/28/2014 06:13 PM   Modules accepted: Orders

## 2014-04-29 LAB — TSH: TSH: 6.368 u[IU]/mL — ABNORMAL HIGH (ref 0.350–4.500)

## 2014-05-07 ENCOUNTER — Other Ambulatory Visit: Payer: Self-pay

## 2014-05-07 DIAGNOSIS — R7989 Other specified abnormal findings of blood chemistry: Secondary | ICD-10-CM

## 2014-05-07 MED ORDER — LEVOTHYROXINE SODIUM 50 MCG PO TABS: ORAL_TABLET | ORAL | Status: DC

## 2014-05-22 ENCOUNTER — Other Ambulatory Visit: Payer: Self-pay | Admitting: *Deleted

## 2014-05-22 MED ORDER — ATENOLOL 50 MG PO TABS: 50.0000 mg | ORAL_TABLET | Freq: Every day | ORAL | Status: DC

## 2014-06-23 ENCOUNTER — Encounter (HOSPITAL_COMMUNITY): Payer: Medicare Other

## 2014-06-23 ENCOUNTER — Other Ambulatory Visit (HOSPITAL_COMMUNITY): Payer: Medicare Other

## 2014-06-30 ENCOUNTER — Ambulatory Visit (HOSPITAL_COMMUNITY)
Admission: RE | Admit: 2014-06-30 | Discharge: 2014-06-30 | Disposition: A | Discharge status: Home / Self Care | Payer: Medicare Other | Source: Ambulatory Visit | Attending: Cardiology | Admitting: Cardiology

## 2014-06-30 ENCOUNTER — Other Ambulatory Visit: Payer: Self-pay | Admitting: Cardiology

## 2014-06-30 ENCOUNTER — Ambulatory Visit (HOSPITAL_BASED_OUTPATIENT_CLINIC_OR_DEPARTMENT_OTHER)
Admission: RE | Admit: 2014-06-30 | Discharge: 2014-06-30 | Disposition: A | Discharge status: Home / Self Care | Payer: Medicare Other | Source: Ambulatory Visit | Attending: Cardiology | Admitting: Cardiology

## 2014-06-30 DIAGNOSIS — I25708 Atherosclerosis of coronary artery bypass graft(s), unspecified, with other forms of angina pectoris: Secondary | ICD-10-CM

## 2014-06-30 DIAGNOSIS — I739 Peripheral vascular disease, unspecified: Secondary | ICD-10-CM

## 2014-06-30 DIAGNOSIS — Z720 Tobacco use: Secondary | ICD-10-CM

## 2014-06-30 DIAGNOSIS — I10 Essential (primary) hypertension: Secondary | ICD-10-CM | POA: Insufficient documentation

## 2014-06-30 DIAGNOSIS — I714 Abdominal aortic aneurysm, without rupture, unspecified: Secondary | ICD-10-CM

## 2014-06-30 DIAGNOSIS — I70203 Unspecified atherosclerosis of native arteries of extremities, bilateral legs: Secondary | ICD-10-CM | POA: Insufficient documentation

## 2014-06-30 DIAGNOSIS — I350 Nonrheumatic aortic (valve) stenosis: Secondary | ICD-10-CM

## 2014-06-30 NOTE — Progress Notes (Signed)
Preliminary results by tech - Arterial Duplex Lower Ext. Completed.  The right sided ABI is 0.71 with a known right common iliac artery occlusion. The left sided ABI  Is normal with no significant disease in the left leg. Oda Cogan, BS, RDMS, RVT

## 2014-06-30 NOTE — Progress Notes (Signed)
Preliminary results by tech - Abdominal Aorta Duplex Completed. Stable mild dilatation of the infrarenal segment aorta measuring 3.0 x 3.2 cm Oda Cogan, BS, RDMS, RVT

## 2014-07-02 ENCOUNTER — Telehealth: Payer: Self-pay | Admitting: Internal Medicine

## 2014-07-02 NOTE — Telephone Encounter (Signed)
Spoke with patient and gave her appointment.

## 2014-07-02 NOTE — Telephone Encounter (Signed)
Spoke with patient and she states she has had diarrhea and abdominal cramping x 3 weeks. She reports when she eats she has diarrhea. No recent antibiotics or travel. She had gastric bypass 10 years ago. She has tried [employer], Imodium, BRAT diet and she even tried some Flagyl she had left over from the past. Nothing helps. Please, advise.(no appointments this week with extender or MD)

## 2014-07-02 NOTE — Telephone Encounter (Signed)
I can see her at 3.35 this Friday 07/04/2014.

## 2014-07-04 ENCOUNTER — Other Ambulatory Visit (INDEPENDENT_AMBULATORY_CARE_PROVIDER_SITE_OTHER): Payer: Medicare Other

## 2014-07-04 ENCOUNTER — Encounter: Payer: Self-pay | Admitting: Internal Medicine

## 2014-07-04 ENCOUNTER — Ambulatory Visit (INDEPENDENT_AMBULATORY_CARE_PROVIDER_SITE_OTHER): Payer: Medicare Other | Admitting: Internal Medicine

## 2014-07-04 VITALS — BP 158/80 | HR 64 | Ht 67.0 in | Wt 232.5 lb

## 2014-07-04 DIAGNOSIS — R195 Other fecal abnormalities: Secondary | ICD-10-CM

## 2014-07-04 DIAGNOSIS — R197 Diarrhea, unspecified: Secondary | ICD-10-CM

## 2014-07-04 LAB — CBC WITH DIFFERENTIAL/PLATELET
BASOS PCT: 0.3 % (ref 0.0–3.0)
Basophils Absolute: 0 10*3/uL (ref 0.0–0.1)
EOS PCT: 2.3 % (ref 0.0–5.0)
Eosinophils Absolute: 0.2 10*3/uL (ref 0.0–0.7)
HCT: 42.3 % (ref 36.0–46.0)
HEMOGLOBIN: 14.2 g/dL (ref 12.0–15.0)
LYMPHS PCT: 27.3 % (ref 12.0–46.0)
Lymphs Abs: 2.3 10*3/uL (ref 0.7–4.0)
MCHC: 33.5 g/dL (ref 30.0–36.0)
MCV: 97.1 fl (ref 78.0–100.0)
Monocytes Absolute: 0.7 10*3/uL (ref 0.1–1.0)
Monocytes Relative: 8.3 % (ref 3.0–12.0)
NEUTROS ABS: 5.2 10*3/uL (ref 1.4–7.7)
Neutrophils Relative %: 61.8 % (ref 43.0–77.0)
Platelets: 180 10*3/uL (ref 150.0–400.0)
RBC: 4.36 Mil/uL (ref 3.87–5.11)
RDW: 13.4 % (ref 11.5–15.5)
WBC: 8.4 10*3/uL (ref 4.0–10.5)

## 2014-07-04 LAB — TSH: TSH: 2.52 u[IU]/mL (ref 0.35–4.50)

## 2014-07-04 LAB — SEDIMENTATION RATE: Sed Rate: 10 mm/hr (ref 0–22)

## 2014-07-04 MED ORDER — METRONIDAZOLE 250 MG PO TABS: 250.0000 mg | ORAL_TABLET | Freq: Three times a day (TID) | ORAL | Status: DC

## 2014-07-04 MED ORDER — CIPROFLOXACIN HCL 500 MG PO TABS: 500.0000 mg | ORAL_TABLET | Freq: Two times a day (BID) | ORAL | Status: DC

## 2014-07-04 MED ORDER — DIPHENOXYLATE-ATROPINE 2.5-0.025 MG PO TABS: 1.0000 | ORAL_TABLET | Freq: Three times a day (TID) | ORAL | Status: DC

## 2014-07-04 NOTE — Patient Instructions (Addendum)
Your physician has requested that you go to the basement for lab work before leaving today. We have sent medications to your pharmacy for pick. Dr Melford Aase,

## 2014-07-04 NOTE — Progress Notes (Signed)
[name] 1940-11-16 [id number]  Note: This dictation was prepared with Dragon digital system. Any transcriptional errors that result from this procedure are unintentional.   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 Stool is  described as offensive, accompanied  by  mild crampy abdominal pain but no nausea, vomiting or fever. The symptoms started approximately 3 weeks ago. She underwent   gastric bypass for obesity approximately 10 years ago and has experienced intermittent diarrhea but not to the extent of the current illness. She had Flagyl 500 mg tablets at home and took one twice a day for 7 days without noticeable improvement. She also has taken Imodium without much improvement. Her medications include aspirin. Tenormin. Wellbutrin. Prozac. And Synthroid which was increased from 50 g qd  to 75 g once a week . She has a hx  of coronary artery bypass graft and has coronary artery disease and 3 cm abdominal aortic aneurysm. She is on Zocor. Last colonoscopy in July 2009 showed mild diverticulosis. Prior colonoscopy in 1991 hyperplastic polyp was removed.    Past Medical History  Diagnosis Date  . Hyperlipidemia   . Hypertension   . Ischemic heart disease     had redo bypass surgery in 2001, at that time she had a free right internal mammary to the LAD, LAD endarterectomy, sequential vein graft to the OM1 and OM2 -- last catheterization was in 2008  . Hypothyroidism   . Obesity     with gastric bypass surgery in 2004  . History of tobacco abuse     Recurrent cigarette smoking  . Abdominal aortic aneurysm 2/12    infrarenal diagnosed with maximum measurement at 3.3 cm  . GERD (gastroesophageal reflux disease)   . Depression   . Leg fracture     remote right leg fracture  . PAD (peripheral artery  disease) 04/13/2011    right iliac occlusion    Past Surgical History  Procedure Laterality Date  . Gastric bypass  2004  . Carpal tunnel release    . Coronary artery bypass graft  1985  . Coronary artery bypass graft  03/09/1999    Redo CABG x3 --  with the free right internal mammary artey to the LAD with an LAD endarterectomy -- Sequential saphenous vein graft to OM1 and OM2                   . Cardiac catheterization  02/16/1999    Left heart catheterization with selective coronary  angiography, left ventricular angiography (RAO and LAO views), angiography of left internal mammary artery, and angiography of saphenous vein grafts  . Cardiac catheterization  10/20/2006    Est. EF of 55% -- Totally occuladed native coronary circulation -- Persistent patency of the right mammary graft and the distal LAD artery with peristent patency of the left mammary graft to the proximal LAD artery with persistant patency of the saphenous vein graft to the sequentail branches of the OM          Allergies  Allergen Reactions  . Chantix [Varenicline Tartrate]   . Dipyridamole     Family history and social history have been reviewed.  Review of Systems:   The remainder of the 10 point ROS is negative except as outlined in the H&P  Physical Exam: General Appearance  Well developed, in no distress, overweight Eyes  Non icteric  HEENT  Non traumatic, normocephalic  Mouth No lesion, tongue papillated, no cheilosis Neck Supple without adenopathy, thyroid not enlarged, no carotid bruits, no JVD Lungs Clear to auscultation bilaterally, most thoracotomy scar COR Normal S1, normal S2, regular rhythm, no murmur, quiet precordium Abdomen obese, soft. Hyperactive bowel sounds. No tenderness. No fullness. No ascites or tympany Rectal small amount of trace Hemoccult-positive mucus and stool Extremities  No pedal edema Skin No lesions Neurological Alert and oriented x 3 Psychological Normal mood and  affect  Assessment and Plan:   74 year old white female with acute diarrhea. History ofl gastric bypass surgery. She is having  painless watery malodorous stool suggestive of infectious diarrhea.. We will obtain stool specimen for pathogens and C. difficile. . We will start Cipro 500 mg twice a day and Flagyl 250 mg 3 times a day. She will follow low-residue diet. We will also check TSH, sedimentation rate. Sprue profile and CBC. She will call back with an update within a week  Heme positive stool, may be anorectal origin. Will recheck CBC.  Colorectal screening. Last colonoscopy in July 2009. Recall in July 2019  Coronary artery disease. Status post coronary artery bypass graft. Infrarenal aneurysm 3 cm  Smoker    Delfin Edis 07/04/2014

## 2014-07-07 ENCOUNTER — Other Ambulatory Visit: Payer: Medicare Other

## 2014-07-07 ENCOUNTER — Ambulatory Visit: Payer: Medicare Other | Admitting: Cardiology

## 2014-07-07 DIAGNOSIS — R197 Diarrhea, unspecified: Secondary | ICD-10-CM

## 2014-07-07 LAB — CELIAC PANEL 10
ENDOMYSIAL SCREEN: NEGATIVE
Gliadin IgA: 4 Units (ref <20)
Gliadin IgG: 1 Units (ref <20)
IgA: 221 mg/dL (ref 69–380)
TISSUE TRANSGLUTAMINASE AB, IGA: 1 U/mL (ref <4)
Tissue Transglut Ab: 1 U/mL (ref <6)

## 2014-07-08 LAB — CLOSTRIDIUM DIFFICILE BY PCR: Toxigenic C. Difficile by PCR: NOT DETECTED

## 2014-07-10 ENCOUNTER — Telehealth: Payer: Self-pay | Admitting: Cardiology

## 2014-07-10 NOTE — Telephone Encounter (Signed)
Returning your cal from yesterday.

## 2014-07-10 NOTE — Telephone Encounter (Signed)
Returned call to patient doppler results given. 

## 2014-07-16 ENCOUNTER — Telehealth: Payer: Self-pay | Admitting: Internal Medicine

## 2014-07-16 MED ORDER — DIPHENOXYLATE-ATROPINE 2.5-0.025 MG PO TABS: 2.0000 | ORAL_TABLET | Freq: Three times a day (TID) | ORAL | Status: DC

## 2014-07-16 MED ORDER — CHOLESTYRAMINE 4 G PO PACK: 4.0000 g | PACK | Freq: Every day | ORAL | Status: DC

## 2014-07-16 NOTE — Telephone Encounter (Signed)
Pt states she saw Dr. Olevia Perches several weeks ago and was given lomotil for diarrhea. Pt states she takes it 3 times a day but reports she is still having 2-3 urgent loose stools. States she cannot control them and is incontinent of stool at times. Pt wants to know if she can do anything else to possibly bulk up the stool or anything different to take. Please advise.

## 2014-07-16 NOTE — Telephone Encounter (Signed)
Spoke with the pt. Diarrhea is better but still 2-3 mushy stools/day. Please refill Lomotil to 2 tid, #100 1 refill, also send Questrn packs 4gm/pack, 1 pack in glass of water qd 1 hour apart from any other medication, ,#10.  Please check on results of stool Pathogens collected  About 10 days ago, need results.

## 2014-07-16 NOTE — Telephone Encounter (Signed)
Scripts sent to pharmacy. [facility] lab called and they state the result should be ready tomorrow.

## 2014-07-21 ENCOUNTER — Telehealth: Payer: Self-pay | Admitting: Internal Medicine

## 2014-07-21 ENCOUNTER — Ambulatory Visit (INDEPENDENT_AMBULATORY_CARE_PROVIDER_SITE_OTHER): Payer: Medicare Other | Admitting: Cardiology

## 2014-07-21 ENCOUNTER — Encounter: Payer: Self-pay | Admitting: Cardiology

## 2014-07-21 ENCOUNTER — Other Ambulatory Visit: Payer: Self-pay | Admitting: *Deleted

## 2014-07-21 ENCOUNTER — Other Ambulatory Visit: Payer: Medicare Other

## 2014-07-21 VITALS — BP 158/84 | HR 76 | Ht 67.0 in | Wt 228.7 lb

## 2014-07-21 DIAGNOSIS — I10 Essential (primary) hypertension: Secondary | ICD-10-CM | POA: Diagnosis not present

## 2014-07-21 DIAGNOSIS — I714 Abdominal aortic aneurysm, without rupture, unspecified: Secondary | ICD-10-CM

## 2014-07-21 DIAGNOSIS — I25708 Atherosclerosis of coronary artery bypass graft(s), unspecified, with other forms of angina pectoris: Secondary | ICD-10-CM

## 2014-07-21 DIAGNOSIS — R197 Diarrhea, unspecified: Secondary | ICD-10-CM

## 2014-07-21 DIAGNOSIS — Z720 Tobacco use: Secondary | ICD-10-CM

## 2014-07-21 DIAGNOSIS — I350 Nonrheumatic aortic (valve) stenosis: Secondary | ICD-10-CM | POA: Diagnosis not present

## 2014-07-21 DIAGNOSIS — I739 Peripheral vascular disease, unspecified: Secondary | ICD-10-CM

## 2014-07-21 LAB — OTHER SOLSTAS TEST

## 2014-07-21 MED ORDER — RIFAXIMIN 550 MG PO TABS: 550.0000 mg | ORAL_TABLET | Freq: Two times a day (BID) | ORAL | Status: DC

## 2014-07-21 NOTE — Telephone Encounter (Signed)
See results note. 

## 2014-07-21 NOTE — Progress Notes (Signed)
[name] Date of Birth: [birth year]   History of Present Illness: [name] is seen today for follow up CAD.   She has known CAD with redo CABG in 2001.  She has a known abdominal aortic aneurysm with last ultrasound  showing a 3.0x 3.2 cm aneurysm. She has a history of PAD with right iliac occlusion. She has chronic claudication symptoms. Her last nuclear stress test in January 2016 showed  anterior wall ischemia. This was unchanged from 2012 and 2013. She had a cardiac cath in 2012 which  showed patent grafts. On followup today she denies chest pain or SOB. 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 She continues to smoke but has cut back to 2-3 cigarettes every other day. Recently she has complained of persistent diarrhea for one month. She is being followed by Dr. Olevia Perches.   Current Outpatient Prescriptions on File Prior to Visit  Medication Sig Dispense Refill  . aspirin 325 MG tablet Take 325 mg by mouth daily.      [name] atenolol (TENORMIN) 50 MG tablet Take 1 tablet (50 mg total) by mouth at bedtime. 90 tablet 0  . cholestyramine (QUESTRAN) 4 G packet Take 1 packet (4 g total) by mouth daily. 10 each 0  . diphenoxylate-atropine (LOMOTIL) 2.5-0.025 MG per tablet Take 2 tablets by mouth 3 (three) times daily. 100 tablet 1  . FLUoxetine (PROZAC) 40 MG capsule TAKE ONE CAPSULE BY MOUTH ONE TIME DAILY  90 capsule 3  . hydrochlorothiazide (HYDRODIURIL) 25 MG tablet Take 25 mg by mouth. prn    . levothyroxine (SYNTHROID) 50 MCG tablet Take 50 mcg daily and take extra 1/2 tablet once a week 60 tablet 6  . nitroGLYCERIN (NITROSTAT) 0.4 MG SL tablet Place 1 tablet (0.4 mg total) under the tongue every 5 (five) minutes as needed. 25 tablet 11  . Omega-3 Fatty Acids (FISH OIL PO) Take 2 capsules by mouth daily.      . simvastatin (ZOCOR) 40 MG tablet Take 1 tablet (40 mg total) by mouth at bedtime. 90 tablet 3   No current facility-administered medications on  file prior to visit.    Allergies  Allergen Reactions  . Chantix [Varenicline Tartrate]   . Dipyridamole     Past Medical History  Diagnosis Date  . Hyperlipidemia   . Hypertension   . Ischemic heart disease     had redo bypass surgery in 2001, at that time she had a free right internal mammary to the LAD, LAD endarterectomy, sequential vein graft to the OM1 and OM2 -- last catheterization was in 2008  . Hypothyroidism   . Obesity     with gastric bypass surgery in 2004  . History of tobacco abuse     Recurrent cigarette smoking  . Abdominal aortic aneurysm 2/12    infrarenal diagnosed with maximum measurement at 3.3 cm  . GERD (gastroesophageal reflux disease)   . Depression   . Leg fracture     remote right leg fracture  . PAD (peripheral artery disease) 04/13/2011    right iliac occlusion    Past Surgical History  Procedure Laterality Date  . Gastric bypass  2004  . Carpal tunnel release    . Coronary artery bypass graft  1985  . Coronary artery bypass graft  03/09/1999    Redo CABG x3 --  with the free right internal mammary artey to the LAD with an LAD endarterectomy -- Sequential  saphenous vein graft to OM1 and OM2                   . Cardiac catheterization  02/16/1999    Left heart catheterization with selective coronary  angiography, left ventricular angiography (RAO and LAO views), angiography of left internal mammary artery, and angiography of saphenous vein grafts  . Cardiac catheterization  10/20/2006    Est. EF of 55% -- Totally occuladed native coronary circulation -- Persistent patency of the right mammary graft and the distal LAD artery with peristent patency of the left mammary graft to the proximal LAD artery with persistant patency of the saphenous vein graft to the sequentail branches of the OM          History  Smoking status  . Current Some Day Smoker -- 0.50 packs/day  . Types: Cigarettes  . Last Attempt to Quit: 01/10/1985  Smokeless tobacco  .  Not on file    History  Alcohol Use No    Family History  Problem Relation Age of Onset  . Stroke Father   . Diabetes Mother   . Pancreatic cancer Mother     Review of Systems: The review of systems is as above.  All other systems were reviewed and are negative.  Physical Exam: BP 158/84 mmHg  Pulse 76  Ht 5\' 7"  (1.702 m)  Wt 103.738 kg (228 lb 11.2 oz)  BMI 35.81 kg/m2 Patient is an obese WF in no acute distress.  HEENT is unremarkable. Normocephalic/atraumatic. PERRL. Sclera are nonicteric. Neck is supple. No masses. No JVD. Lungs are clear. Cardiac exam shows a regular rate and rhythm. She has a 3/6 harsh outflow murmur. Abdomen is obese but soft. Extremities are without edema. Pedal pulses are nonpalpable. Gait and ROM are intact. No gross neurologic deficits noted.   LABORATORY DATA: Lab Results  Component Value Date   CHOL 149 02/04/2014   HDL 60 02/04/2014   LDLCALC 64 02/04/2014   TRIG 124 02/04/2014   CHOLHDL 2.5 02/04/2014   Lab Results  Component Value Date   WBC 8.4 07/04/2014   HGB 14.2 07/04/2014   HCT 42.3 07/04/2014   PLT 180.0 07/04/2014   GLUCOSE 97 02/04/2014   CHOL 149 02/04/2014   TRIG 124 02/04/2014   HDL 60 02/04/2014   LDLCALC 64 02/04/2014   ALT 14 02/04/2014   AST 17 02/04/2014   NA 140 02/04/2014   K 4.7 02/04/2014   CL 106 02/04/2014   CREATININE 0.75 02/04/2014   BUN 13 02/04/2014   CO2 26 02/04/2014   TSH 2.52 07/04/2014   INR 1.0 10/16/2006    Ecg today shows NSR with LVH and old anterior infarct. No acute changes. I have personally reviewed and interpreted this study.   Assessment / Plan: 1. Coronary disease status post CABG. Redo coronary bypass surgery in 2001 with sequential saphenous vein graft to the first and second obtuse marginal vessels and a free RIMA graft to the LAD. Prior LIMA graft to the proximal LAD. Myoview study January 2016 was stable showing anterior ischemia similar to prior studies.  Cardiac cath in  2008 showed patent grafts. Asymptomatic. Continue aspirin, beta blocker, and statin therapy.   2. PAD with right iliac occlusion. Stable claudication. Dopplers in June 2016 showed improvement in right ABI from 0.6>0.71  3. Abdominal aortic aneurysm measuring 3.0 cm. Follow up yearly  4. Hypertension, controlled.  5. Tobacco abuse-encouraged complete cessation.   6. Obesity.  7. Diarrhea- per Dr.  Brodie.  8. Aortic stenosis- mild per Echo in 2012. Consider repeat Echo next year.

## 2014-07-21 NOTE — Patient Instructions (Signed)
Continue your current therapy  I will see you in 6 months.   

## 2014-07-21 NOTE — Telephone Encounter (Signed)
Patient will pick up samples. Samples up front.

## 2014-07-22 ENCOUNTER — Other Ambulatory Visit: Payer: Medicare Other

## 2014-07-22 DIAGNOSIS — R197 Diarrhea, unspecified: Secondary | ICD-10-CM

## 2014-07-28 LAB — OTHER SOLSTAS TEST: Miscellaneous Test: 65008

## 2014-07-29 ENCOUNTER — Encounter: Payer: Self-pay | Admitting: Internal Medicine

## 2014-07-30 ENCOUNTER — Other Ambulatory Visit: Payer: Self-pay | Admitting: *Deleted

## 2014-07-30 ENCOUNTER — Other Ambulatory Visit: Payer: Self-pay

## 2014-07-30 MED ORDER — LEVOTHYROXINE SODIUM 50 MCG PO TABS: ORAL_TABLET | ORAL | Status: DC

## 2014-07-30 MED ORDER — LEVOTHYROXINE SODIUM 50 MCG PO TABS
ORAL_TABLET | ORAL | Status: DC
Start: 2014-07-30 — End: 2014-07-30

## 2014-08-11 ENCOUNTER — Other Ambulatory Visit: Payer: Self-pay | Admitting: Cardiology

## 2014-09-16 ENCOUNTER — Telehealth: Payer: Self-pay | Admitting: Internal Medicine

## 2014-09-16 ENCOUNTER — Ambulatory Visit: Payer: Medicare Other | Admitting: Cardiology

## 2014-09-16 NOTE — Telephone Encounter (Signed)
Patient states Xifaxan helped diarrhea for a short period but it is coming back. Also having GERD and nausea. Scheduled with Nicoletta Ba, PA on 09/23/14 per patient request.

## 2014-09-17 ENCOUNTER — Ambulatory Visit: Payer: Medicare Other | Admitting: Cardiology

## 2014-09-23 ENCOUNTER — Ambulatory Visit: Payer: Medicare Other | Admitting: Physician Assistant

## 2014-09-26 ENCOUNTER — Encounter: Payer: Self-pay | Admitting: Internal Medicine

## 2014-09-26 ENCOUNTER — Ambulatory Visit (INDEPENDENT_AMBULATORY_CARE_PROVIDER_SITE_OTHER): Payer: Medicare Other | Admitting: Internal Medicine

## 2014-09-26 ENCOUNTER — Other Ambulatory Visit: Payer: Self-pay

## 2014-09-26 VITALS — BP 149/78 | HR 65 | Temp 97.1°F | Ht 67.0 in | Wt 212.4 lb

## 2014-09-26 DIAGNOSIS — R197 Diarrhea, unspecified: Secondary | ICD-10-CM

## 2014-09-26 DIAGNOSIS — R195 Other fecal abnormalities: Secondary | ICD-10-CM

## 2014-09-26 DIAGNOSIS — K219 Gastro-esophageal reflux disease without esophagitis: Secondary | ICD-10-CM

## 2014-09-26 DIAGNOSIS — K529 Noninfective gastroenteritis and colitis, unspecified: Secondary | ICD-10-CM | POA: Diagnosis not present

## 2014-09-26 DIAGNOSIS — R634 Abnormal weight loss: Secondary | ICD-10-CM | POA: Diagnosis not present

## 2014-09-26 LAB — CBC WITH DIFFERENTIAL/PLATELET
BASOS PCT: 0 % (ref 0–1)
Basophils Absolute: 0 10*3/uL (ref 0.0–0.1)
EOS ABS: 0.2 10*3/uL (ref 0.0–0.7)
Eosinophils Relative: 2 % (ref 0–5)
HCT: 45.1 % (ref 36.0–46.0)
Hemoglobin: 15.2 g/dL — ABNORMAL HIGH (ref 12.0–15.0)
Lymphocytes Relative: 25 % (ref 12–46)
Lymphs Abs: 2.4 10*3/uL (ref 0.7–4.0)
MCH: 31.3 pg (ref 26.0–34.0)
MCHC: 33.7 g/dL (ref 30.0–36.0)
MCV: 93 fL (ref 78.0–100.0)
MONO ABS: 0.8 10*3/uL (ref 0.1–1.0)
MONOS PCT: 8 % (ref 3–12)
MPV: 10.1 fL (ref 8.6–12.4)
NEUTROS ABS: 6.1 10*3/uL (ref 1.7–7.7)
NEUTROS PCT: 65 % (ref 43–77)
PLATELETS: 213 10*3/uL (ref 150–400)
RBC: 4.85 MIL/uL (ref 3.87–5.11)
RDW: 13.6 % (ref 11.5–15.5)
WBC: 9.4 10*3/uL (ref 4.0–10.5)

## 2014-09-26 MED ORDER — PEG 3350-KCL-NA BICARB-NACL 420 G PO SOLR: 4000.0000 mL | ORAL | Status: DC

## 2014-09-26 NOTE — Patient Instructions (Signed)
Schedule EGD and colonoscopy (longstanding GERD, hemocult positive and chronic diarrhea)  Split prep  CBC, chem-12, total serum IgA level  Further recommendations to follow

## 2014-09-26 NOTE — Progress Notes (Signed)
Primary Care Physician:  [name], MD Primary Gastroenterologist:  Dr. Gala Romney  Pre-Procedure History & Physical: HPI:  [name] is a 74 y.o. female here as a self-referral for further evaluation of chronic diarrhea. Patient states that back in May of this year she started having loose bowels multiple non-bloody stools day and night with incontinence. No significant abdominal pain. She has seen Dr. Delfin Edis on multiple occasions. GI pathogen panel and celiac screen negative (total serum IgA not done);  has been empirically treated with Cipro, Flagyl and Xifaxin. She reports only mild improvement with Xifaxin but she was also treated with Lomotil and Questran transiently during Xifaxin therapy. She was found to be occult blood positive. Last colonoscopy without significant findings in 2009.  Patient states she is lactose intolerant but avoids lactose-containing foods. No new medications. States she has lost 16 pounds during the past 4 months.  Worsening reflux symptoms with intermittent nausea and vomiting over the past 4 months; GERD symptoms for years. No dysphagia or odynophagia. Takes husband's or OTC PPI sporadically. Denies ever having an EGD (even preoperatively for bariatric surgery)  Past Medical History  Diagnosis Date  . Hyperlipidemia   . Hypertension   . Ischemic heart disease     had redo bypass surgery in 2001, at that time she had a free right internal mammary to the LAD, LAD endarterectomy, sequential vein graft to the OM1 and OM2 -- last catheterization was in 2008  . Hypothyroidism   . Obesity     with gastric bypass surgery in 2004  . History of tobacco abuse     Recurrent cigarette smoking  . Abdominal aortic aneurysm 2/12    infrarenal diagnosed with maximum measurement at 3.3 cm  . GERD (gastroesophageal reflux disease)   . Depression   . Leg fracture     remote right leg fracture  . PAD (peripheral artery disease) 04/13/2011    right iliac occlusion    . Diverticulosis   . Hyperplastic colon polyp     Past Surgical History  Procedure Laterality Date  . Gastric bypass  2004  . Carpal tunnel release    . Coronary artery bypass graft  1985  . Coronary artery bypass graft  03/09/1999    Redo CABG x3 --  with the free right internal mammary artey to the LAD with an LAD endarterectomy -- Sequential saphenous vein graft to OM1 and OM2                   . Cardiac catheterization  02/16/1999    Left heart catheterization with selective coronary  angiography, left ventricular angiography (RAO and LAO views), angiography of left internal mammary artery, and angiography of saphenous vein grafts  . Cardiac catheterization  10/20/2006    Est. EF of 55% -- Totally occuladed native coronary circulation -- Persistent patency of the right mammary graft and the distal LAD artery with peristent patency of the left mammary graft to the proximal LAD artery with persistant patency of the saphenous vein graft to the sequentail branches of the OM        . Cholecystectomy    . Colonoscopy  07/12/2007    Dr.Brodie- normal cecum and rectum, diverticulosis in the sigmoid colon  . Colonoscopy  1991    per Dr.Brodie office visit note= hyperplastic polyp    Prior to Admission medications   Medication Sig Start Date End Date Taking? Authorizing Provider  aspirin 325 MG tablet Take 325  mg by mouth daily.     Yes Historical Provider, MD  atenolol (TENORMIN) 50 MG tablet TAKE 1 TABLET BY MOUTH AT BEDTIME 08/12/14  Yes Peter M Martinique, MD  diphenoxylate-atropine (LOMOTIL) 2.5-0.025 MG per tablet Take 2 tablets by mouth 3 (three) times daily. 07/16/14  Yes Lafayette Dragon, MD  FLUoxetine (PROZAC) 40 MG capsule TAKE ONE CAPSULE BY MOUTH ONE TIME DAILY  04/04/14  Yes Burtis Junes, NP  hydrochlorothiazide (HYDRODIURIL) 25 MG tablet Take 25 mg by mouth. prn   Yes Historical Provider, MD  levothyroxine (SYNTHROID) 50 MCG tablet Take 50 mcg daily and take extra 1/2 tablet once a week  07/30/14  Yes Peter M Martinique, MD  loperamide (IMODIUM) 2 MG capsule Take by mouth as needed for diarrhea or loose stools.   Yes Historical Provider, MD  nitroGLYCERIN (NITROSTAT) 0.4 MG SL tablet Place 1 tablet (0.4 mg total) under the tongue every 5 (five) minutes as needed. 03/11/11  Yes Peter M Martinique, MD  simvastatin (ZOCOR) 40 MG tablet Take 1 tablet (40 mg total) by mouth at bedtime. 11/01/13  Yes Peter M Martinique, MD  cholestyramine Lucrezia Starch) 4 G packet Take 1 packet (4 g total) by mouth daily. Patient not taking: Reported on 09/26/2014 07/16/14   Lafayette Dragon, MD  metroNIDAZOLE (FLAGYL) 250 MG tablet Take 250 mg by mouth 3 (three) times daily. 07/04/14   Historical Provider, MD  Omega-3 Fatty Acids (FISH OIL PO) Take 2 capsules by mouth daily.      Historical Provider, MD  rifaximin (XIFAXAN) 550 MG TABS tablet Take 1 tablet (550 mg total) by mouth 2 (two) times daily. Lot SCKP Expires 10;/19. 14 box=28 tablets Patient not taking: Reported on 09/26/2014 07/21/14   Lafayette Dragon, MD    Allergies as of 09/26/2014 - Review Complete 09/26/2014  Allergen Reaction Noted  . Chantix [varenicline tartrate]  03/08/2011  . Dipyridamole  08/10/2010    Family History  Problem Relation Age of Onset  . Stroke Father   . Diabetes Mother   . Pancreatic cancer Mother     Social History   Social History  . Marital Status: Married    Spouse Name: N/A  . Number of Children: N/A  . Years of Education: N/A   Occupational History  . Not on file.   Social History Main Topics  . Smoking status: Current Some Day Smoker -- 0.50 packs/day    Types: Cigarettes    Last Attempt to Quit: 01/10/1985  . Smokeless tobacco: Not on file  . Alcohol Use: No  . Drug Use: No  . Sexual Activity: No   Other Topics Concern  . Not on file   Social History Narrative    Review of Systems: See HPI, otherwise negative ROS  Physical Exam: BP 149/78 mmHg  Pulse 65  Temp(Src) 97.1 F (36.2 C)  Ht 5\' 7"  (1.702  m)  Wt 212 lb 6.4 oz (96.344 kg)  BMI 33.26 kg/m2 General:   Alert, pleasant and cooperative in NAD Skin:  Intact without significant lesions or rashes. Eyes:  Sclera clear, no icterus.   Conjunctiva pink. Ears:  Normal auditory acuity. Nose:  No deformity, discharge,  or lesions. Mouth:  No deformity or lesions. Neck:  Supple; no masses or thyromegaly. No significant cervical adenopathy. Lungs:  Clear throughout to auscultation.   No wheezes, crackles, or rhonchi. No acute distress. Heart:  Regular rate and rhythm; no murmurs, clicks, rubs,  or gallops. Abdomen: obese. Non-distended,  normal bowel sounds.  Soft and nontender without appreciable mass or hepatosplenomegaly.  Pulses:  Normal pulses noted. Extremities:  Without clubbing or edema. Rectal: Deferred until time of colonoscopy    Impression:  Very pleasant 74 year old lady with a number of medical problems now with a 4 month history of non-bloody diarrhea. History bariatric surgery several years ago with fairly successful weight loss. Over the past 4 months, however, she's lost another 16 pounds in the setting of incessant non-bloody diarrhea with incontinence. Celiac screen appears negative although a total IgA antibody not done. Stool studies negative. She really did not have a meaningful response to Flagyl, Cipro and a  10 day course of Xifaxin.    Etiology of her diarrhea is not likely infectious in origin. Moreover, the lack of response to antibiotic therapy makes bacterial overgrowth unlikely as well.  No new medications to implicate. At this time, microscopic colitis (either lymphocytic or collagenous colitis) remains high on the differential.Occult Crohn's ileitis would also be possible at this point in time.  This is Not irritable bowel syndrome.  Long-standing, poorly treated GERD with intermittent nausea and vomiting.  No prior EGD, ever.  Hemoccult-positive stool deserves further evaluation.   Recommendations:  I  have offered the patient both a diagnostic ileo-colonoscopy with segmental biopsies as well as an EGD in the near future. The risks, benefits, limitations, imponderables and alternatives regarding both EGD and colonoscopy have been reviewed with the patient. Questions have been answered. All parties agreeable.   Will check a serum total IgA level as well as a CBC and Chem-12 today as well.  Further recommendations to follow.       Notice: This dictation was prepared with Dragon dictation along with smaller phrase technology. Any transcriptional errors that result from this process are unintentional and may not be corrected upon review.

## 2014-09-27 LAB — COMPREHENSIVE METABOLIC PANEL
ALT: 9 U/L (ref 6–29)
AST: 18 U/L (ref 10–35)
Albumin: 3.2 g/dL — ABNORMAL LOW (ref 3.6–5.1)
Alkaline Phosphatase: 54 U/L (ref 33–130)
BILIRUBIN TOTAL: 1.1 mg/dL (ref 0.2–1.2)
BUN: 7 mg/dL (ref 7–25)
CHLORIDE: 104 mmol/L (ref 98–110)
CO2: 24 mmol/L (ref 20–31)
CREATININE: 0.62 mg/dL (ref 0.60–0.93)
Calcium: 9.3 mg/dL (ref 8.6–10.4)
Glucose, Bld: 59 mg/dL — ABNORMAL LOW (ref 65–99)
Potassium: 3.3 mmol/L — ABNORMAL LOW (ref 3.5–5.3)
SODIUM: 142 mmol/L (ref 135–146)
Total Protein: 5.9 g/dL — ABNORMAL LOW (ref 6.1–8.1)

## 2014-09-28 LAB — IGA: IgA: 255 mg/dL (ref 69–380)

## 2014-09-29 ENCOUNTER — Other Ambulatory Visit: Payer: Self-pay

## 2014-09-29 MED ORDER — POTASSIUM CHLORIDE ER 10 MEQ PO TBCR: 10.0000 meq | EXTENDED_RELEASE_TABLET | Freq: Every day | ORAL | Status: DC

## 2014-09-30 ENCOUNTER — Encounter (HOSPITAL_COMMUNITY)
Admission: RE | Disposition: A | Discharge status: Home / Self Care | Payer: Self-pay | Source: Ambulatory Visit | Attending: Internal Medicine

## 2014-09-30 ENCOUNTER — Ambulatory Visit (HOSPITAL_COMMUNITY)
Admission: RE | Admit: 2014-09-30 | Discharge: 2014-09-30 | Disposition: A | Discharge status: Home / Self Care | Payer: Medicare Other | Source: Ambulatory Visit | Attending: Internal Medicine | Admitting: Internal Medicine

## 2014-09-30 ENCOUNTER — Encounter (HOSPITAL_COMMUNITY): Payer: Self-pay

## 2014-09-30 DIAGNOSIS — K2100 Gastro-esophageal reflux disease with esophagitis, without bleeding: Secondary | ICD-10-CM | POA: Insufficient documentation

## 2014-09-30 DIAGNOSIS — K449 Diaphragmatic hernia without obstruction or gangrene: Secondary | ICD-10-CM | POA: Insufficient documentation

## 2014-09-30 DIAGNOSIS — Z7982 Long term (current) use of aspirin: Secondary | ICD-10-CM | POA: Diagnosis not present

## 2014-09-30 DIAGNOSIS — E785 Hyperlipidemia, unspecified: Secondary | ICD-10-CM | POA: Diagnosis not present

## 2014-09-30 DIAGNOSIS — F17210 Nicotine dependence, cigarettes, uncomplicated: Secondary | ICD-10-CM | POA: Diagnosis not present

## 2014-09-30 DIAGNOSIS — Z6833 Body mass index (BMI) 33.0-33.9, adult: Secondary | ICD-10-CM | POA: Insufficient documentation

## 2014-09-30 DIAGNOSIS — R112 Nausea with vomiting, unspecified: Secondary | ICD-10-CM | POA: Insufficient documentation

## 2014-09-30 DIAGNOSIS — K5730 Diverticulosis of large intestine without perforation or abscess without bleeding: Secondary | ICD-10-CM | POA: Diagnosis not present

## 2014-09-30 DIAGNOSIS — E669 Obesity, unspecified: Secondary | ICD-10-CM | POA: Insufficient documentation

## 2014-09-30 DIAGNOSIS — K529 Noninfective gastroenteritis and colitis, unspecified: Secondary | ICD-10-CM | POA: Diagnosis not present

## 2014-09-30 DIAGNOSIS — Z9884 Bariatric surgery status: Secondary | ICD-10-CM | POA: Diagnosis not present

## 2014-09-30 DIAGNOSIS — Z79899 Other long term (current) drug therapy: Secondary | ICD-10-CM | POA: Insufficient documentation

## 2014-09-30 DIAGNOSIS — K5289 Other specified noninfective gastroenteritis and colitis: Secondary | ICD-10-CM | POA: Insufficient documentation

## 2014-09-30 DIAGNOSIS — K219 Gastro-esophageal reflux disease without esophagitis: Secondary | ICD-10-CM | POA: Diagnosis present

## 2014-09-30 DIAGNOSIS — I10 Essential (primary) hypertension: Secondary | ICD-10-CM | POA: Diagnosis not present

## 2014-09-30 DIAGNOSIS — R159 Full incontinence of feces: Secondary | ICD-10-CM | POA: Insufficient documentation

## 2014-09-30 DIAGNOSIS — Z951 Presence of aortocoronary bypass graft: Secondary | ICD-10-CM | POA: Diagnosis not present

## 2014-09-30 DIAGNOSIS — R197 Diarrhea, unspecified: Secondary | ICD-10-CM | POA: Diagnosis not present

## 2014-09-30 DIAGNOSIS — E039 Hypothyroidism, unspecified: Secondary | ICD-10-CM | POA: Diagnosis not present

## 2014-09-30 DIAGNOSIS — F329 Major depressive disorder, single episode, unspecified: Secondary | ICD-10-CM | POA: Insufficient documentation

## 2014-09-30 HISTORY — DX: Nausea with vomiting, unspecified: R11.2

## 2014-09-30 HISTORY — PX: ESOPHAGOGASTRODUODENOSCOPY: SHX5428

## 2014-09-30 HISTORY — PX: COLONOSCOPY: SHX5424

## 2014-09-30 HISTORY — DX: Other specified postprocedural states: Z98.890

## 2014-09-30 SURGERY — COLONOSCOPY
Anesthesia: Moderate Sedation

## 2014-09-30 MED ORDER — MIDAZOLAM HCL 5 MG/5ML IJ SOLN
INTRAMUSCULAR | Status: AC
  Filled 2014-09-30: qty 10

## 2014-09-30 MED ORDER — MIDAZOLAM HCL 5 MG/5ML IJ SOLN
INTRAMUSCULAR | Status: DC | PRN
  Administered 2014-09-30: 2 mg via INTRAVENOUS
  Administered 2014-09-30 (×2): 1 mg via INTRAVENOUS
  Administered 2014-09-30: 2 mg via INTRAVENOUS
  Administered 2014-09-30: 1 mg via INTRAVENOUS

## 2014-09-30 MED ORDER — MEPERIDINE HCL 100 MG/ML IJ SOLN
INTRAMUSCULAR | Status: AC
  Filled 2014-09-30: qty 2

## 2014-09-30 MED ORDER — LIDOCAINE VISCOUS 2 % MT SOLN
OROMUCOSAL | Status: AC
  Filled 2014-09-30: qty 15

## 2014-09-30 MED ORDER — LIDOCAINE VISCOUS 2 % MT SOLN
OROMUCOSAL | Status: DC | PRN
  Administered 2014-09-30: 1 via OROMUCOSAL

## 2014-09-30 MED ORDER — ONDANSETRON HCL 4 MG/2ML IJ SOLN
INTRAMUSCULAR | Status: DC | PRN
  Administered 2014-09-30: 4 mg via INTRAVENOUS

## 2014-09-30 MED ORDER — MEPERIDINE HCL 100 MG/ML IJ SOLN
INTRAMUSCULAR | Status: DC | PRN
  Administered 2014-09-30 (×4): 25 mg

## 2014-09-30 MED ORDER — ONDANSETRON HCL 4 MG/2ML IJ SOLN
INTRAMUSCULAR | Status: AC
  Filled 2014-09-30: qty 2

## 2014-09-30 MED ORDER — STERILE WATER FOR IRRIGATION IR SOLN
Status: DC | PRN
  Administered 2014-09-30: 11:00:00

## 2014-09-30 NOTE — Op Note (Signed)
[facility] [address] [place], [address]   COLONOSCOPY PROCEDURE REPORT  PATIENT: Undine, Nealis  MR#: [id number] BIRTHDATE: 09/20/1940 , 22  yrs. old GENDER: female ENDOSCOPIST: R.  Garfield Cornea, MD FACP [facility] REFERRED BY:Dr. Anastasia Pall PROCEDURE DATE:  10/29/2014 PROCEDURE:   Ileo-colonoscopy with biopsy INDICATIONS:chronic diarrhea. MEDICATIONS: Versed 8 mg IV and Demerol 100 mg IV in divided doses. Xylocaine gel orally.  Zofran 4 mg IV. ASA CLASS:       Class III  CONSENT: The risks, benefits, alternatives and imponderables including but not limited to bleeding, perforation as well as the possibility of a missed lesion have been reviewed.  The potential for biopsy, lesion removal, etc. have also been discussed. Questions have been answered.  All parties agreeable.  Please see the history and physical in the medical record for more information.  DESCRIPTION OF PROCEDURE:   After the risks benefits and alternatives of the procedure were thoroughly explained, informed consent was obtained.  The digital rectal exam revealed no abnormalities of the rectum.   The [id number] ([id number])  endoscope was introduced through the anus and advanced to the terminal ileum which was intubated for a short distance. No adverse events experienced.   The quality of the prep was adequate  The instrument was then slowly withdrawn as the colon was fully examined. Estimated blood loss is zero unless otherwise noted in this procedure report.      COLON FINDINGS: Normal-appearing rectal mucosa.  Scattered left-sided diverticula; colonic mucosa had a somewhat pale, friable appearance diffusely; no ulcers or erosions seen, however.  The distal 10 cm of terminal ileum appeared entirely normal.  Segmental biopsies of the ascending as well as the descending/sigmoid segments taken.  Retroflexion was performed. .  Withdrawal time=10 minutes 0 seconds.  The scope was withdrawn  and the procedure completed. COMPLICATIONS: There were no immediate complications.  ENDOSCOPIC IMPRESSION: Colonic diverticulosis. Subtly abnormal colonic mucosa as described?"status post segmental biopsy. No evidence of Crohn's disease found today.  RECOMMENDATIONS: Further recommendations to follow pending review of pathology report. See EGD report.  eSigned:  R. Garfield Cornea, MD Rosalita Chessman [facility] 2014/10/29 11:38 AM   cc:  CPT CODES: ICD CODES:  The ICD and CPT codes recommended by this software are interpretations from the data that the clinical staff has captured with the software.  The verification of the translation of this report to the ICD and CPT codes and modifiers is the sole responsibility of the health care institution and practicing physician where this report was generated.  [employer]. will not be held responsible for the validity of the ICD and CPT codes included on this report.  AMA assumes no liability for data contained or not contained herein. CPT is a Designer, television/film set of the [employer].  PATIENT NAME:  Hyla, Coard MR#: [id number]

## 2014-09-30 NOTE — Interval H&P Note (Signed)
History and Physical Interval Note:  09/30/2014 10:40 AM  [name]  has presented today for surgery, with the diagnosis of GERD/CHRONIC DIARRHEA  The various methods of treatment have been discussed with the patient and family. After consideration of risks, benefits and other options for treatment, the patient has consented to  Procedure(s) with comments: COLONOSCOPY (N/A) - 1000 ESOPHAGOGASTRODUODENOSCOPY (EGD) (N/A) as a surgical intervention .  The patient's history has been reviewed, patient examined, no change in status, stable for surgery.  I have reviewed the patient's chart and labs.  Questions were answered to the patient's satisfaction.     Robert Rourk  No change. EGD and colonoscopy today per plan.  The risks, benefits, limitations, imponderables and alternatives regarding both EGD and colonoscopy have been reviewed with the patient. Questions have been answered. All parties agreeable.

## 2014-09-30 NOTE — Op Note (Signed)
[facility] [address] [place], [address]   ENDOSCOPY PROCEDURE REPORT  PATIENT: [name], [name]  MR#: [id number] BIRTHDATE: 11/26/1940 , 74  yrs. old GENDER: female ENDOSCOPIST: R.  Garfield Cornea, MD [facility] REFERRED BY:  Dr. Anastasia Pall PROCEDURE DATE:  Oct 25, 2014 PROCEDURE:  EGD, diagnostic INDICATIONS:  Long-standing GERD; status post bariatric surgery. MEDICATIONS: Versed 6 mg IV and Demerol 75 mg IV in divided doses. Zofran 4 mg IV.  Xylocaine gel orally ASA CLASS:      Class III  CONSENT: The risks, benefits, limitations, alternatives and imponderables have been discussed.  The potential for biopsy, esophogeal dilation, etc. have also been reviewed.  Questions have been answered.  All parties agreeable.  Please see the history and physical in the medical record for more information.  DESCRIPTION OF PROCEDURE: After the risks benefits and alternatives of the procedure were thoroughly explained, informed consent was obtained.  The [id number] ([id number]) endoscope was introduced through the mouth and advanced to the gastric pouch., d. The instrument was slowly withdrawn as the mucosa was fully examined. Estimated blood loss is zero unless otherwise noted in this procedure report.    Distal esophageal erosions straddling the GE junction.  No Barrett's esophagus.  EG junction patulous.  Surgically altered stomach consistent with prior history of bariatric surgery.  Anastomosis with small bowel appeared normal.  Small hiatal hernia seen. Residual gastric mucosa appeared normal.  Patent, normal-appearing efferent limb  Retroflexed views revealed a hiatal hernia.     The scope was then withdrawn from the patient and the procedure completed.  COMPLICATIONS: There were no immediate complications.  ENDOSCOPIC IMPRESSION: Mild erosive reflux esophagitis. Status post prior bariatric surgery  RECOMMENDATIONS: Trial of Dexilant 60 mg daily?"patient is go  my my office for free samples. See colonoscopy report.  REPEAT EXAM:  eSigned:  R. Garfield Cornea, MD Rosalita Chessman [facility] 2014/10/25 11:08 AM    CC:  CPT CODES: ICD CODES:  The ICD and CPT codes recommended by this software are interpretations from the data that the clinical staff has captured with the software.  The verification of the translation of this report to the ICD and CPT codes and modifiers is the sole responsibility of the health care institution and practicing physician where this report was generated.  [facility] will not be held responsible for the validity of the ICD and CPT codes included on this report.  AMA assumes no liability for data contained or not contained herein. CPT is a Designer, television/film set of the [employer].

## 2014-09-30 NOTE — H&P (View-Only) (Signed)
Primary Care Physician:  [name], MD Primary Gastroenterologist:  Dr. Gala Romney  Pre-Procedure History & Physical: HPI:  [name] is a 74 y.o. female here as a self-referral for further evaluation of chronic diarrhea. Patient states that back in May of this year she started having loose bowels multiple non-bloody stools day and night with incontinence. No significant abdominal pain. She has seen Dr. Delfin Edis on multiple occasions. GI pathogen panel and celiac screen negative (total serum IgA not done);  has been empirically treated with Cipro, Flagyl and Xifaxin. She reports only mild improvement with Xifaxin but she was also treated with Lomotil and Questran transiently during Xifaxin therapy. She was found to be occult blood positive. Last colonoscopy without significant findings in 2009.  Patient states she is lactose intolerant but avoids lactose-containing foods. No new medications. States she has lost 16 pounds during the past 4 months.  Worsening reflux symptoms with intermittent nausea and vomiting over the past 4 months; GERD symptoms for years. No dysphagia or odynophagia. Takes husband's or OTC PPI sporadically. Denies ever having an EGD (even preoperatively for bariatric surgery)  Past Medical History  Diagnosis Date  . Hyperlipidemia   . Hypertension   . Ischemic heart disease     had redo bypass surgery in 2001, at that time she had a free right internal mammary to the LAD, LAD endarterectomy, sequential vein graft to the OM1 and OM2 -- last catheterization was in 2008  . Hypothyroidism   . Obesity     with gastric bypass surgery in 2004  . History of tobacco abuse     Recurrent cigarette smoking  . Abdominal aortic aneurysm 2/12    infrarenal diagnosed with maximum measurement at 3.3 cm  . GERD (gastroesophageal reflux disease)   . Depression   . Leg fracture     remote right leg fracture  . PAD (peripheral artery disease) 04/13/2011    right iliac occlusion    . Diverticulosis   . Hyperplastic colon polyp     Past Surgical History  Procedure Laterality Date  . Gastric bypass  2004  . Carpal tunnel release    . Coronary artery bypass graft  1985  . Coronary artery bypass graft  03/09/1999    Redo CABG x3 --  with the free right internal mammary artey to the LAD with an LAD endarterectomy -- Sequential saphenous vein graft to OM1 and OM2                   . Cardiac catheterization  02/16/1999    Left heart catheterization with selective coronary  angiography, left ventricular angiography (RAO and LAO views), angiography of left internal mammary artery, and angiography of saphenous vein grafts  . Cardiac catheterization  10/20/2006    Est. EF of 55% -- Totally occuladed native coronary circulation -- Persistent patency of the right mammary graft and the distal LAD artery with peristent patency of the left mammary graft to the proximal LAD artery with persistant patency of the saphenous vein graft to the sequentail branches of the OM        . Cholecystectomy    . Colonoscopy  07/12/2007    Dr.Brodie- normal cecum and rectum, diverticulosis in the sigmoid colon  . Colonoscopy  1991    per Dr.Brodie office visit note= hyperplastic polyp    Prior to Admission medications   Medication Sig Start Date End Date Taking? Authorizing Provider  aspirin 325 MG tablet Take 325  mg by mouth daily.     Yes Historical Provider, MD  atenolol (TENORMIN) 50 MG tablet TAKE 1 TABLET BY MOUTH AT BEDTIME 08/12/14  Yes Peter M Martinique, MD  diphenoxylate-atropine (LOMOTIL) 2.5-0.025 MG per tablet Take 2 tablets by mouth 3 (three) times daily. 07/16/14  Yes Lafayette Dragon, MD  FLUoxetine (PROZAC) 40 MG capsule TAKE ONE CAPSULE BY MOUTH ONE TIME DAILY  04/04/14  Yes Burtis Junes, NP  hydrochlorothiazide (HYDRODIURIL) 25 MG tablet Take 25 mg by mouth. prn   Yes Historical Provider, MD  levothyroxine (SYNTHROID) 50 MCG tablet Take 50 mcg daily and take extra 1/2 tablet once a week  07/30/14  Yes Peter M Martinique, MD  loperamide (IMODIUM) 2 MG capsule Take by mouth as needed for diarrhea or loose stools.   Yes Historical Provider, MD  nitroGLYCERIN (NITROSTAT) 0.4 MG SL tablet Place 1 tablet (0.4 mg total) under the tongue every 5 (five) minutes as needed. 03/11/11  Yes Peter M Martinique, MD  simvastatin (ZOCOR) 40 MG tablet Take 1 tablet (40 mg total) by mouth at bedtime. 11/01/13  Yes Peter M Martinique, MD  cholestyramine Lucrezia Starch) 4 G packet Take 1 packet (4 g total) by mouth daily. Patient not taking: Reported on 09/26/2014 07/16/14   Lafayette Dragon, MD  metroNIDAZOLE (FLAGYL) 250 MG tablet Take 250 mg by mouth 3 (three) times daily. 07/04/14   Historical Provider, MD  Omega-3 Fatty Acids (FISH OIL PO) Take 2 capsules by mouth daily.      Historical Provider, MD  rifaximin (XIFAXAN) 550 MG TABS tablet Take 1 tablet (550 mg total) by mouth 2 (two) times daily. Lot SCKP Expires 10;/19. 14 box=28 tablets Patient not taking: Reported on 09/26/2014 07/21/14   Lafayette Dragon, MD    Allergies as of 09/26/2014 - Review Complete 09/26/2014  Allergen Reaction Noted  . Chantix [varenicline tartrate]  03/08/2011  . Dipyridamole  08/10/2010    Family History  Problem Relation Age of Onset  . Stroke Father   . Diabetes Mother   . Pancreatic cancer Mother     Social History   Social History  . Marital Status: Married    Spouse Name: N/A  . Number of Children: N/A  . Years of Education: N/A   Occupational History  . Not on file.   Social History Main Topics  . Smoking status: Current Some Day Smoker -- 0.50 packs/day    Types: Cigarettes    Last Attempt to Quit: 01/10/1985  . Smokeless tobacco: Not on file  . Alcohol Use: No  . Drug Use: No  . Sexual Activity: No   Other Topics Concern  . Not on file   Social History Narrative    Review of Systems: See HPI, otherwise negative ROS  Physical Exam: BP 149/78 mmHg  Pulse 65  Temp(Src) 97.1 F (36.2 C)  Ht 5\' 7"  (1.702  m)  Wt 212 lb 6.4 oz (96.344 kg)  BMI 33.26 kg/m2 General:   Alert, pleasant and cooperative in NAD Skin:  Intact without significant lesions or rashes. Eyes:  Sclera clear, no icterus.   Conjunctiva pink. Ears:  Normal auditory acuity. Nose:  No deformity, discharge,  or lesions. Mouth:  No deformity or lesions. Neck:  Supple; no masses or thyromegaly. No significant cervical adenopathy. Lungs:  Clear throughout to auscultation.   No wheezes, crackles, or rhonchi. No acute distress. Heart:  Regular rate and rhythm; no murmurs, clicks, rubs,  or gallops. Abdomen: obese. Non-distended,  normal bowel sounds.  Soft and nontender without appreciable mass or hepatosplenomegaly.  Pulses:  Normal pulses noted. Extremities:  Without clubbing or edema. Rectal: Deferred until time of colonoscopy    Impression:  Very pleasant 74 year old lady with a number of medical problems now with a 4 month history of non-bloody diarrhea. History bariatric surgery several years ago with fairly successful weight loss. Over the past 4 months, however, she's lost another 16 pounds in the setting of incessant non-bloody diarrhea with incontinence. Celiac screen appears negative although a total IgA antibody not done. Stool studies negative. She really did not have a meaningful response to Flagyl, Cipro and a  10 day course of Xifaxin.    Etiology of her diarrhea is not likely infectious in origin. Moreover, the lack of response to antibiotic therapy makes bacterial overgrowth unlikely as well.  No new medications to implicate. At this time, microscopic colitis (either lymphocytic or collagenous colitis) remains high on the differential.Occult Crohn's ileitis would also be possible at this point in time.  This is Not irritable bowel syndrome.  Long-standing, poorly treated GERD with intermittent nausea and vomiting.  No prior EGD, ever.  Hemoccult-positive stool deserves further evaluation.   Recommendations:  I  have offered the patient both a diagnostic ileo-colonoscopy with segmental biopsies as well as an EGD in the near future. The risks, benefits, limitations, imponderables and alternatives regarding both EGD and colonoscopy have been reviewed with the patient. Questions have been answered. All parties agreeable.   Will check a serum total IgA level as well as a CBC and Chem-12 today as well.  Further recommendations to follow.       Notice: This dictation was prepared with Dragon dictation along with smaller phrase technology. Any transcriptional errors that result from this process are unintentional and may not be corrected upon review.

## 2014-09-30 NOTE — Discharge Instructions (Addendum)
Colonoscopy Discharge Instructions  Read the instructions outlined below and refer to this sheet in the next few weeks. These discharge instructions provide you with general information on caring for yourself after you leave the hospital. Your doctor may also give you specific instructions. While your treatment has been planned according to the most current medical practices available, unavoidable complications occasionally occur. If you have any problems or questions after discharge, call Dr. Gala Romney at [phone]. ACTIVITY  You may resume your regular activity, but move at a slower pace for the next 24 hours.   Take frequent rest periods for the next 24 hours.   Walking will help get rid of the air and reduce the bloated feeling in your belly (abdomen).   No driving for 24 hours (because of the medicine (anesthesia) used during the test).    Do not sign any important legal documents or operate any machinery for 24 hours (because of the anesthesia used during the test).  NUTRITION  Drink plenty of fluids.   You may resume your normal diet as instructed by your doctor.   Begin with a light meal and progress to your normal diet. Heavy or fried foods are harder to digest and may make you feel sick to your stomach (nauseated).   Avoid alcoholic beverages for 24 hours or as instructed.  MEDICATIONS  You may resume your normal medications unless your doctor tells you otherwise.  WHAT YOU CAN EXPECT TODAY  Some feelings of bloating in the abdomen.   Passage of more gas than usual.   Spotting of blood in your stool or on the toilet paper.  IF YOU HAD POLYPS REMOVED DURING THE COLONOSCOPY:  No aspirin products for 7 days or as instructed.   No alcohol for 7 days or as instructed.   Eat a soft diet for the next 24 hours.  FINDING OUT THE RESULTS OF YOUR TEST Not all test results are available during your visit. If your test results are not back during the visit, make an appointment  with your caregiver to find out the results. Do not assume everything is normal if you have not heard from your caregiver or the medical facility. It is important for you to follow up on all of your test results.  SEEK IMMEDIATE MEDICAL ATTENTION IF:  You have more than a spotting of blood in your stool.   Your belly is swollen (abdominal distention).   You are nauseated or vomiting.   You have a temperature over 101.  You have abdominal pain or discomfort that is severe or gets worse throughout the day. EGD Discharge instructions Please read the instructions outlined below and refer to this sheet in the next few weeks. These discharge instructions provide you with general information on caring for yourself after you leave the hospital. Your doctor may also give you specific instructions. While your treatment has been planned according to the most current medical practices available, unavoidable complications occasionally occur. If you have any problems or questions after discharge, please call your doctor. ACTIVITY You may resume your regular activity but move at a slower pace for the next 24 hours.  Take frequent rest periods for the next 24 hours.  Walking will help expel (get rid of) the air and reduce the bloated feeling in your abdomen.  No driving for 24 hours (because of the anesthesia (medicine) used during the test).  You may shower.  Do not sign any important legal documents or operate any machinery for 24  hours (because of the anesthesia used during the test).  NUTRITION Drink plenty of fluids.  You may resume your normal diet.  Begin with a light meal and progress to your normal diet.  Avoid alcoholic beverages for 24 hours or as instructed by your caregiver.  MEDICATIONS You may resume your normal medications unless your caregiver tells you otherwise.  WHAT YOU CAN EXPECT TODAY You may experience abdominal discomfort such as a feeling of fullness or gas pains.   FOLLOW-UP Your doctor will discuss the results of your test with you.  SEEK IMMEDIATE MEDICAL ATTENTION IF ANY OF THE FOLLOWING OCCUR: Excessive nausea (feeling sick to your stomach) and/or vomiting.  Severe abdominal pain and distention (swelling).  Trouble swallowing.  Temperature over 101 F (37.8 C).  Rectal bleeding or vomiting of blood.     GERD and diverticulosis information provided  Begin Dexilant 60 mg daily - go by my office for a 2 week supply of free samples  Further recommendations to follow pending review of pathology report  Gastroesophageal Reflux Disease, Adult Gastroesophageal reflux disease (GERD) happens when acid from your stomach flows up into the esophagus. When acid comes in contact with the esophagus, the acid causes soreness (inflammation) in the esophagus. Over time, GERD may create small holes (ulcers) in the lining of the esophagus. CAUSES   Increased body weight. This puts pressure on the stomach, making acid rise from the stomach into the esophagus.  Smoking. This increases acid production in the stomach.  Drinking alcohol. This causes decreased pressure in the lower esophageal sphincter (valve or ring of muscle between the esophagus and stomach), allowing acid from the stomach into the esophagus.  Late evening meals and a full stomach. This increases pressure and acid production in the stomach.  A malformed lower esophageal sphincter. Sometimes, no cause is found. SYMPTOMS   Burning pain in the lower part of the mid-chest behind the breastbone and in the mid-stomach area. This may occur twice a week or more often.  Trouble swallowing.  Sore throat.  Dry cough.  Asthma-like symptoms including chest tightness, shortness of breath, or wheezing. DIAGNOSIS  Your caregiver may be able to diagnose GERD based on your symptoms. In some cases, X-rays and other tests may be done to check for complications or to check the condition of your stomach  and esophagus. TREATMENT  Your caregiver may recommend over-the-counter or prescription medicines to help decrease acid production. Ask your caregiver before starting or adding any new medicines.  HOME CARE INSTRUCTIONS   Change the factors that you can control. Ask your caregiver for guidance concerning weight loss, quitting smoking, and alcohol consumption.  Avoid foods and drinks that make your symptoms worse, such as:  Caffeine or alcoholic drinks.  Chocolate.  Peppermint or mint flavorings.  Garlic and onions.  Spicy foods.  Citrus fruits, such as oranges, lemons, or limes.  Tomato-based foods such as sauce, chili, salsa, and pizza.  Fried and fatty foods.  Avoid lying down for the 3 hours prior to your bedtime or prior to taking a nap.  Eat small, frequent meals instead of large meals.  Wear loose-fitting clothing. Do not wear anything tight around your waist that causes pressure on your stomach.  Raise the head of your bed 6 to 8 inches with wood blocks to help you sleep. Extra pillows will not help.  Only take over-the-counter or prescription medicines for pain, discomfort, or fever as directed by your caregiver.  Do not take aspirin, ibuprofen,  or other nonsteroidal anti-inflammatory drugs (NSAIDs). SEEK IMMEDIATE MEDICAL CARE IF:   You have pain in your arms, neck, jaw, teeth, or back.  Your pain increases or changes in intensity or duration.  You develop nausea, vomiting, or sweating (diaphoresis).  You develop shortness of breath, or you faint.  Your vomit is green, yellow, black, or looks like coffee grounds or blood.  Your stool is red, bloody, or black. These symptoms could be signs of other problems, such as heart disease, gastric bleeding, or esophageal bleeding. MAKE SURE YOU:   Understand these instructions.  Will watch your condition.  Will get help right away if you are not doing well or get worse.  Diverticulosis Diverticulosis is the  condition that develops when small pouches (diverticula) form in the wall of your colon. Your colon, or large intestine, is where water is absorbed and stool is formed. The pouches form when the inside layer of your colon pushes through weak spots in the outer layers of your colon. CAUSES  No one knows exactly what causes diverticulosis. RISK FACTORS  Being older than 36. Your risk for this condition increases with age. Diverticulosis is rare in people younger than 40 years. By age 41, almost everyone has it.  Eating a low-fiber diet.  Being frequently constipated.  Being overweight.  Not getting enough exercise.  Smoking.  Taking over-the-counter pain medicines, like aspirin and ibuprofen. SYMPTOMS  Most people with diverticulosis do not have symptoms. DIAGNOSIS  Because diverticulosis often has no symptoms, health care providers often discover the condition during an exam for other colon problems. In many cases, a health care provider will diagnose diverticulosis while using a flexible scope to examine the colon (colonoscopy). TREATMENT  If you have never developed an infection related to diverticulosis, you may not need treatment. If you have had an infection before, treatment may include:  Eating more fruits, vegetables, and grains.  Taking a fiber supplement.  Taking a live bacteria supplement (probiotic).  Taking medicine to relax your colon. HOME CARE INSTRUCTIONS   Drink at least 6-8 glasses of water each day to prevent constipation.  Try not to strain when you have a bowel movement.  Keep all follow-up appointments. If you have had an infection before:  Increase the fiber in your diet as directed by your health care provider or dietitian.  Take a dietary fiber supplement if your health care provider approves.  Only take medicines as directed by your health care provider. SEEK MEDICAL CARE IF:   You have abdominal pain.  You have bloating.  You have  cramps.  You have not gone to the bathroom in 3 days. SEEK IMMEDIATE MEDICAL CARE IF:   Your pain gets worse.  Yourbloating becomes very bad.  You have a fever or chills, and your symptoms suddenly get worse.  You begin vomiting.  You have bowel movements that are bloody or black. MAKE SURE YOU:  Understand these instructions.  Will watch your condition.  Will get help right away if you are not doing well or get worse. Document Released: 09/24/2003 Document Revised: 01/01/2013 Document Reviewed: 11/21/2012 [facility] Patient Information 2015 [place], [place]. This information is not intended to replace advice given to you by your health care provider. Make sure you discuss any questions you have with your health care provider.

## 2014-10-01 ENCOUNTER — Encounter: Payer: Self-pay | Admitting: General Practice

## 2014-10-02 ENCOUNTER — Other Ambulatory Visit: Payer: Self-pay | Admitting: Internal Medicine

## 2014-10-02 MED ORDER — BUDESONIDE 3 MG PO CPEP: 9.0000 mg | ORAL_CAPSULE | Freq: Every day | ORAL | Status: DC

## 2014-10-03 ENCOUNTER — Encounter: Payer: Self-pay | Admitting: Internal Medicine

## 2014-10-03 NOTE — Progress Notes (Signed)
APPT MADE AND LETTER SENT  °

## 2014-10-05 ENCOUNTER — Telehealth: Payer: Self-pay | Admitting: Internal Medicine

## 2014-10-05 NOTE — Telephone Encounter (Signed)
Call to review lab results. Spoke to husband. Husband states of diarrhea is much better on Entocort.

## 2014-10-06 ENCOUNTER — Encounter (HOSPITAL_COMMUNITY): Payer: Self-pay | Admitting: Internal Medicine

## 2014-10-14 ENCOUNTER — Telehealth: Payer: Self-pay

## 2014-10-14 ENCOUNTER — Other Ambulatory Visit: Payer: Self-pay

## 2014-10-14 MED ORDER — DEXLANSOPRAZOLE 60 MG PO CPDR: 60.0000 mg | DELAYED_RELEASE_CAPSULE | Freq: Every day | ORAL | Status: DC

## 2014-10-14 NOTE — Telephone Encounter (Signed)
Noted  

## 2014-10-14 NOTE — Telephone Encounter (Signed)
Okay. Dexilant 60 mg once daily. Dispense #30 with 11 refills

## 2014-10-14 NOTE — Telephone Encounter (Signed)
Patient called and is requesting a RX for [place]. States the samples are working great. Please advise

## 2014-10-30 ENCOUNTER — Other Ambulatory Visit: Payer: Self-pay

## 2014-11-03 ENCOUNTER — Other Ambulatory Visit: Payer: Self-pay

## 2014-11-03 NOTE — Telephone Encounter (Signed)
Pharmacy needs refill from [facility] or our office

## 2014-11-04 ENCOUNTER — Encounter: Payer: Self-pay | Admitting: Internal Medicine

## 2014-11-04 ENCOUNTER — Ambulatory Visit (INDEPENDENT_AMBULATORY_CARE_PROVIDER_SITE_OTHER): Payer: Medicare Other | Admitting: Internal Medicine

## 2014-11-04 VITALS — BP 183/90 | HR 68 | Temp 96.9°F | Ht 67.0 in | Wt 215.0 lb

## 2014-11-04 DIAGNOSIS — K2100 Gastro-esophageal reflux disease with esophagitis, without bleeding: Secondary | ICD-10-CM

## 2014-11-04 DIAGNOSIS — K52832 Lymphocytic colitis: Secondary | ICD-10-CM

## 2014-11-04 MED ORDER — PANTOPRAZOLE SODIUM 40 MG PO TBEC: 40.0000 mg | DELAYED_RELEASE_TABLET | Freq: Every day | ORAL | Status: DC

## 2014-11-04 NOTE — Progress Notes (Signed)
Primary Care Physician:  [name], MD Primary Gastroenterologist:  Dr. Gala Romney  Pre-Procedure History & Physical: HPI:  [name] is a 74 y.o. female here for follow-up biopsy-proven lymphocytic colitis. Started Entocort 9 mg daily  -  about a month ago;  diarrhea has resolved. 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 Dexilant 60 mg daily initiated. Reflux symptoms and nausea have gotten much better as well. Has had some ankle edema and slightly increasing shortness of breath recently but not much over her baseline and she reports.  On no diuretic therapy at home although she did have some old hydrochlorothiazide she was prescribed previously.  Hasn't seen her PCP or cardiologist in quite a while. Has appointments the first of next year.   No longer taking Questran. Has Lomotil on hand just in case.  Past Medical History  Diagnosis Date  . Hyperlipidemia   . Hypertension   . Ischemic heart disease     had redo bypass surgery in 2001, at that time she had a free right internal mammary to the LAD, LAD endarterectomy, sequential vein graft to the OM1 and OM2 -- last catheterization was in 2008  . Hypothyroidism   . Obesity     with gastric bypass surgery in 2004  . History of tobacco abuse     Recurrent cigarette smoking  . Abdominal aortic aneurysm ([place]) 2/12    infrarenal diagnosed with maximum measurement at 3.3 cm  . GERD (gastroesophageal reflux disease)   . Depression   . Leg fracture     remote right leg fracture  . PAD (peripheral artery disease) ([place]) 04/13/2011    right iliac occlusion  . Diverticulosis   . Hyperplastic colon polyp   . PONV (postoperative nausea and vomiting)   . Microscopic colitis     Past Surgical History  Procedure Laterality Date  . Gastric bypass  2004  . Carpal tunnel release    . Coronary artery bypass graft  1985  . Coronary artery bypass graft  03/09/1999    Redo CABG x3 --  with the free right internal mammary artey  to the LAD with an LAD endarterectomy -- Sequential saphenous vein graft to OM1 and OM2                   . Cardiac catheterization  02/16/1999    Left heart catheterization with selective coronary  angiography, left ventricular angiography (RAO and LAO views), angiography of left internal mammary artery, and angiography of saphenous vein grafts  . Cardiac catheterization  10/20/2006    Est. EF of 55% -- Totally occuladed native coronary circulation -- Persistent patency of the right mammary graft and the distal LAD artery with peristent patency of the left mammary graft to the proximal LAD artery with persistant patency of the saphenous vein graft to the sequentail branches of the OM        . Cholecystectomy    . Colonoscopy  07/12/2007    Dr.Brodie- normal cecum and rectum, diverticulosis in the sigmoid colon  . Colonoscopy  1991    per Dr.Brodie office visit note= hyperplastic polyp  . Colonoscopy N/A 09/30/2014    Dr.Agape Hardiman- noraml appearing rectal mucosa, scattered L sided diverticula, colonic mucosa has a somewhat pale friable appearence diffusely, no ulcers or erosions seen, the distal 10cm of terminal ileum appeared entirely normal. bx=benign colorectal mucosa with lymphocytic colitis  . Esophagogastroduodenoscopy N/A 09/30/2014    Dr.Cheryllynn Sarff-mild erosive reflux esophagitis s/p bariatric  surgery    Prior to Admission medications   Medication Sig Start Date End Date Taking? Authorizing Provider  aspirin 325 MG tablet Take 325 mg by mouth daily.     Yes Historical Provider, MD  atenolol (TENORMIN) 50 MG tablet TAKE 1 TABLET BY MOUTH AT BEDTIME 08/12/14  Yes Peter M Martinique, MD  budesonide (ENTOCORT EC) 3 MG 24 hr capsule Take 3 capsules (9 mg total) by mouth daily. 10/02/14  Yes Daneil Dolin, MD  dexlansoprazole (DEXILANT) 60 MG capsule Take 1 capsule (60 mg total) by mouth daily. 10/14/14  Yes Daneil Dolin, MD  diphenoxylate-atropine (LOMOTIL) 2.5-0.025 MG per tablet Take 2 tablets by mouth 3  (three) times daily. 07/16/14  Yes Lafayette Dragon, MD  FLUoxetine (PROZAC) 40 MG capsule TAKE ONE CAPSULE BY MOUTH ONE TIME DAILY  04/04/14  Yes Burtis Junes, NP  FLUZONE HIGH-DOSE 0.5 ML SUSY TO BE ADMINISTERED BY PHARMACIST FOR IMMUNIZATION 10/14/14  Yes Historical Provider, MD  hydrochlorothiazide (HYDRODIURIL) 25 MG tablet Take 25 mg by mouth daily as needed (fluid). prn   Yes Historical Provider, MD  levothyroxine (SYNTHROID) 50 MCG tablet Take 50 mcg daily and take extra 1/2 tablet once a week Patient taking differently: Take 50 mcg by mouth daily before breakfast.  07/30/14  Yes Peter M Martinique, MD  loperamide (IMODIUM) 2 MG capsule Take by mouth as needed for diarrhea or loose stools.   Yes Historical Provider, MD  potassium chloride (K-DUR) 10 MEQ tablet Take 1 tablet (10 mEq total) by mouth daily. 09/29/14  Yes Daneil Dolin, MD  simvastatin (ZOCOR) 40 MG tablet Take 1 tablet (40 mg total) by mouth at bedtime. 11/01/13  Yes Peter M Martinique, MD  cholestyramine Lucrezia Starch) 4 G packet Take 1 packet (4 g total) by mouth daily. Patient not taking: Reported on 11/04/2014 07/16/14   Lafayette Dragon, MD  KLOR-CON M10 10 MEQ tablet TAKE AS DIRECTED (2 TABS DAILY) 09/29/14   Historical Provider, MD  nitroGLYCERIN (NITROSTAT) 0.4 MG SL tablet Place 1 tablet (0.4 mg total) under the tongue every 5 (five) minutes as needed. Patient not taking: Reported on 11/04/2014 03/11/11   Peter M Martinique, MD  polyethylene glycol-electrolytes (TRILYTE) 420 G solution Take 4,000 mLs by mouth as directed. Patient not taking: Reported on 11/04/2014 09/26/14   Daneil Dolin, MD  rifaximin (XIFAXAN) 550 MG TABS tablet Take 1 tablet (550 mg total) by mouth 2 (two) times daily. Lot SCKP Expires 10;/19. 14 box=28 tablets Patient not taking: Reported on 11/04/2014 07/21/14   Lafayette Dragon, MD    Allergies as of 11/04/2014 - Review Complete 11/04/2014  Allergen Reaction Noted  . Chantix [varenicline tartrate]  03/08/2011  .  Dipyridamole  08/10/2010    Family History  Problem Relation Age of Onset  . Stroke Father   . Diabetes Mother   . Pancreatic cancer Mother     Social History   Social History  . Marital Status: Married    Spouse Name: N/A  . Number of Children: N/A  . Years of Education: N/A   Occupational History  . Not on file.   Social History Main Topics  . Smoking status: Current Some Day Smoker -- 0.50 packs/day    Types: Cigarettes    Last Attempt to Quit: 01/10/1985  . Smokeless tobacco: Not on file  . Alcohol Use: No  . Drug Use: No  . Sexual Activity: No   Other Topics Concern  . Not  on file   Social History Narrative    Review of Systems: See HPI, otherwise negative ROS  Physical Exam: BP 183/90 mmHg  Pulse 68  Temp(Src) 96.9 F (36.1 C)  Ht 5\' 7"  (1.702 m)  Wt 215 lb (97.523 kg)  BMI 33.67 kg/m2 General:   Alert,  , pleasant and cooperative in NAD Skin:  Intact without significant lesions or rashes. Eyes:  Sclera clear, no icterus.   Conjunctiva pink. Neck:  Supple; no masses or thyromegaly. No significant cervical adenopathy.  No hepatojugular reflux Lungs:  Clear throughout to auscultation.   No wheezes, crackles, or rhonchi. No acute distress. Heart:  Regular rate and rhythm; 2/6 systolic ejection murmur., clicks, rubs,  or gallops. Abdomen: Non-distended, normal bowel sounds.  Soft and nontender without appreciable mass or hepatosplenomegaly.  Pulses:  Normal pulses noted. Extremities:  1+ lower extremity edema  Impression:  Pleasant 74 year old lady with biopsy-proven lymphocytic colitis-diarrhea resolved on budesonide 9 mg daily. Reflux symptoms well controlled on Dexilant. Medications too expensive for chronic therapy, however. She has mild shortness of breath but certainly appears to be in no acute distress, whatsoever. Mild ankle edema. This could be budesonide effect or not.  She's overdue for followup by her PCP and cardiologist.  I discussed the  chronic nature of GERD. I discussed the goal of remission with microscopic colitis. She could have flares in the future.  Recommendations:     Decrease Entocort to 6 mg daily for one month; then decrease to 3 mg for another month, then off.  Switch Dexilant to Protonix 40 mg daily  See primary care or cardiologist regarding shortness of breath  Office visit with me in 3 months       Notice: This dictation was prepared with Dragon dictation along with smaller phrase technology. Any transcriptional errors that result from this process are unintentional and may not be corrected upon review.

## 2014-11-04 NOTE — Patient Instructions (Addendum)
Decrease Entocort to 6 mg daily for one month; then decrease to 3 mg for another month, then off.  Switch Dexilant to Protonix 40 mg daily  See primary care or cardiologist regarding shortness of breath  Office visit with me in 3 months

## 2014-11-24 ENCOUNTER — Telehealth: Payer: Self-pay

## 2014-11-24 MED ORDER — BUDESONIDE 3 MG PO CPEP: 9.0000 mg | ORAL_CAPSULE | Freq: Every day | ORAL | Status: DC

## 2014-11-24 NOTE — Telephone Encounter (Signed)
Pt is aware. She said she will need a new rx soon and asked that I send it in to her pharmacy. rx has been sent in.

## 2014-11-24 NOTE — Telephone Encounter (Signed)
Increase to 9 mg daily (3 tabs) x 2 months

## 2014-11-24 NOTE — Telephone Encounter (Signed)
Pt called- Left message on voicemail. She said she cut the entocort back to 2 a day and now the diarrhea has come back. She wants to know if you want her to go back on 3 a day or continue taking 2 a day?

## 2014-12-26 ENCOUNTER — Telehealth: Payer: Self-pay | Admitting: Cardiology

## 2014-12-26 NOTE — Telephone Encounter (Signed)
Pt had BP readings which were high this AM, she continued to check several readings. Noted they have only been a little high for past day or two.  Encouraged BID BP checks, call next week if still high or call sooner if symptoms worse. OTC meds for h/a. Pt voiced understanding.

## 2014-12-26 NOTE — Telephone Encounter (Signed)
New message        Pt c/o BP issue: STAT if pt c/o blurred vision, one-sided weakness or slurred speech  1. What are your last 5 BP readings? 160/89, 159/94, 155/104, 166/85 2. Are you having any other symptoms (ex. Dizziness, headache, blurred vision, passed out)? Slight headache  3. What is your BP issue?  Pt states her bp is high.  Pt took a HCTZ this am

## 2014-12-30 ENCOUNTER — Encounter: Payer: Self-pay | Admitting: Internal Medicine

## 2014-12-31 ENCOUNTER — Telehealth: Payer: Self-pay | Admitting: Cardiology

## 2014-12-31 MED ORDER — NEBIVOLOL HCL 10 MG PO TABS: 10.0000 mg | ORAL_TABLET | Freq: Every day | ORAL | Status: DC

## 2014-12-31 NOTE — Telephone Encounter (Signed)
Returned call to patient.She stated her B/P has been elevated since last week.Ranging 137/80 to 160/110.Pulse 60 to 76 bpm.Stated she has been taking atenolol for a long time.She wanted to ask Dr.Jordan if needs to be changed.Message sent to [place] for advice.

## 2014-12-31 NOTE — Addendum Note (Signed)
Addended by: Golden Hurter D on: 12/31/2014 04:36 PM   Modules accepted: Orders, Medications

## 2014-12-31 NOTE — Telephone Encounter (Signed)
Please call,her blood pressure have been high all week.

## 2014-12-31 NOTE — Telephone Encounter (Signed)
Returned call to patient no answer.LMTC. 

## 2014-12-31 NOTE — Telephone Encounter (Signed)
Returned call to patient.Advised of Dr.Jordan's recommendations.Bystolic 10 mg sent to pharmacy.Advised to monitor B/P and bring readings to appointment with Dr.Jordan 01/16/15 at 9:45 am.Advised to call sooner if needed.

## 2014-12-31 NOTE — Telephone Encounter (Signed)
Returning your call. °

## 2014-12-31 NOTE — Telephone Encounter (Signed)
I would switch to Bystolic 10 mg daily. This is more effective BP drug. Monitor BP and let us know. If needed we could increase to 20 mg daily.  Bellarose Burtt Martinique MD, [facility]

## 2015-01-02 ENCOUNTER — Telehealth: Payer: Self-pay | Admitting: Cardiology

## 2015-01-02 NOTE — Telephone Encounter (Signed)
Please call,the new blood pressure medicine is not working.

## 2015-01-02 NOTE — Telephone Encounter (Signed)
Spoke with pt, aware of dr jordan's recommendations. 

## 2015-01-02 NOTE — Telephone Encounter (Signed)
Would go ahead and increase bystolic to 20 mg daily.  Uniqua Kihn Martinique MD, [facility]

## 2015-01-02 NOTE — Telephone Encounter (Signed)
Spoke with pt, she was started on bystolic 10 mg Wednesday this week. Yesterday her bp was still [id number] to [id number] diastolic so she took another 10 mg of bystolic at bedtime. This morning her bp is 179/127 and 155/115 so she took 20 mg of the bystolic. Explained to pt she needs to give the medication a little longer to get the full effect. Pt hung up the phone. Will make dr Martinique aware of bp readings, ? Any change in current medications?

## 2015-01-16 ENCOUNTER — Ambulatory Visit (INDEPENDENT_AMBULATORY_CARE_PROVIDER_SITE_OTHER): Payer: Medicare Other | Admitting: Cardiology

## 2015-01-16 ENCOUNTER — Encounter: Payer: Self-pay | Admitting: Cardiology

## 2015-01-16 VITALS — BP 130/78 | HR 67 | Ht 67.0 in | Wt 193.7 lb

## 2015-01-16 DIAGNOSIS — I714 Abdominal aortic aneurysm, without rupture, unspecified: Secondary | ICD-10-CM

## 2015-01-16 DIAGNOSIS — I10 Essential (primary) hypertension: Secondary | ICD-10-CM

## 2015-01-16 DIAGNOSIS — I350 Nonrheumatic aortic (valve) stenosis: Secondary | ICD-10-CM

## 2015-01-16 DIAGNOSIS — I25810 Atherosclerosis of coronary artery bypass graft(s) without angina pectoris: Secondary | ICD-10-CM | POA: Diagnosis not present

## 2015-01-16 DIAGNOSIS — I739 Peripheral vascular disease, unspecified: Secondary | ICD-10-CM

## 2015-01-16 DIAGNOSIS — R0989 Other specified symptoms and signs involving the circulatory and respiratory systems: Secondary | ICD-10-CM | POA: Insufficient documentation

## 2015-01-16 MED ORDER — NEBIVOLOL HCL 20 MG PO TABS: 20.0000 mg | ORAL_TABLET | Freq: Every day | ORAL | Status: DC

## 2015-01-16 NOTE — Progress Notes (Signed)
[name] Date of Birth: 1940/10/06   History of Present Illness: [name] is seen today for follow up CAD and PAD.   She has known CAD with redo CABG in 2001.  She has a known abdominal aortic aneurysm with last ultrasound  showing a  3.2 cm aneurysm in June 2016. She has a history of PAD with right iliac occlusion. Last ABIs in June 2016 showed improvement in right ABI from 0.6 to .71.  She has chronic claudication symptoms. Her last nuclear stress test in January 2016 showed  anterior wall ischemia. This was unchanged from 2012 and 2013. She had a cardiac cath in 2012 which  showed patent grafts.  On followup today she denies chest pain or SOB. She has been very concerned about her blood pressure. She is checking her blood pressure with a wrist monitor and getting very high readings to 189/137. We switched atenolol to Bystolic. She is tolerating this well but still notes high readings at home.  She has chronic diarrhea and has been diagnosed with lymphocytic colitis. Followed by Dr. Gala Romney. She has lost 35 lbs since July.    Current Outpatient Prescriptions on File Prior to Visit  Medication Sig Dispense Refill  . aspirin 325 MG tablet Take 81 mg by mouth daily.     . budesonide (ENTOCORT EC) 3 MG 24 hr capsule Take 3 capsules (9 mg total) by mouth daily. 90 capsule 1  . diphenoxylate-atropine (LOMOTIL) 2.5-0.025 MG per tablet Take 2 tablets by mouth 3 (three) times daily. 100 tablet 1  . FLUoxetine (PROZAC) 40 MG capsule TAKE ONE CAPSULE BY MOUTH ONE TIME DAILY  90 capsule 3  . FLUZONE HIGH-DOSE 0.5 ML SUSY TO BE ADMINISTERED BY PHARMACIST FOR IMMUNIZATION  0  . hydrochlorothiazide (HYDRODIURIL) 25 MG tablet Take 25 mg by mouth daily as needed (fluid). prn    . levothyroxine (SYNTHROID) 50 MCG tablet Take 50 mcg daily and take extra 1/2 tablet once a week (Patient taking differently: Take 50 mcg by mouth daily before breakfast. ) 96 tablet 3  . loperamide (IMODIUM) 2 MG capsule Take by  mouth as needed for diarrhea or loose stools.    . nitroGLYCERIN (NITROSTAT) 0.4 MG SL tablet Place 1 tablet (0.4 mg total) under the tongue every 5 (five) minutes as needed. 25 tablet 11  . pantoprazole (PROTONIX) 40 MG tablet Take 1 tablet (40 mg total) by mouth daily. 30 tablet 11  . potassium chloride (K-DUR) 10 MEQ tablet Take 1 tablet (10 mEq total) by mouth daily. 5 tablet 0  . simvastatin (ZOCOR) 40 MG tablet Take 1 tablet (40 mg total) by mouth at bedtime. 90 tablet 3   No current facility-administered medications on file prior to visit.    Allergies  Allergen Reactions  . Dipyridamole Hives  . Varenicline Hives  . Chantix [Varenicline Tartrate]   . Dipyridamole     Past Medical History  Diagnosis Date  . Hyperlipidemia   . Hypertension   . Ischemic heart disease     had redo bypass surgery in 2001, at that time she had a free right internal mammary to the LAD, LAD endarterectomy, sequential vein graft to the OM1 and OM2 -- last catheterization was in 2008  . Hypothyroidism   . Obesity     with gastric bypass surgery in 2004  . History of tobacco abuse     Recurrent cigarette smoking  . Abdominal aortic aneurysm ([place]) 2/12    infrarenal diagnosed  with maximum measurement at 3.3 cm  . GERD (gastroesophageal reflux disease)   . Depression   . Leg fracture     remote right leg fracture  . PAD (peripheral artery disease) ([place]) 04/13/2011    right iliac occlusion  . Diverticulosis   . Hyperplastic colon polyp   . PONV (postoperative nausea and vomiting)   . Microscopic colitis     Past Surgical History  Procedure Laterality Date  . Gastric bypass  2004  . Carpal tunnel release    . Coronary artery bypass graft  1985  . Coronary artery bypass graft  03/09/1999    Redo CABG x3 --  with the free right internal mammary artey to the LAD with an LAD endarterectomy -- Sequential saphenous vein graft to OM1 and OM2                   . Cardiac catheterization  02/16/1999     Left heart catheterization with selective coronary  angiography, left ventricular angiography (RAO and LAO views), angiography of left internal mammary artery, and angiography of saphenous vein grafts  . Cardiac catheterization  10/20/2006    Est. EF of 55% -- Totally occuladed native coronary circulation -- Persistent patency of the right mammary graft and the distal LAD artery with peristent patency of the left mammary graft to the proximal LAD artery with persistant patency of the saphenous vein graft to the sequentail branches of the OM        . Cholecystectomy    . Colonoscopy  07/12/2007    Dr.Brodie- normal cecum and rectum, diverticulosis in the sigmoid colon  . Colonoscopy  1991    per Dr.Brodie office visit note= hyperplastic polyp  . Colonoscopy N/A 09/30/2014    Dr.Rourk- noraml appearing rectal mucosa, scattered L sided diverticula, colonic mucosa has a somewhat pale friable appearence diffusely, no ulcers or erosions seen, the distal 10cm of terminal ileum appeared entirely normal. bx=benign colorectal mucosa with lymphocytic colitis  . Esophagogastroduodenoscopy N/A 09/30/2014    Dr.Rourk-mild erosive reflux esophagitis s/p bariatric surgery    History  Smoking status  . Current Some Day Smoker -- 0.50 packs/day  . Types: Cigarettes  . Last Attempt to Quit: 01/10/1985  Smokeless tobacco  . Not on file    History  Alcohol Use No    Family History  Problem Relation Age of Onset  . Stroke Father   . Diabetes Mother   . Pancreatic cancer Mother     Review of Systems: The review of systems is as above.  All other systems were reviewed and are negative.  Physical Exam: BP 130/78 mmHg  Pulse 67  Ht 5\' 7"  (1.702 m)  Wt 87.862 kg (193 lb 11.2 oz)  BMI 30.33 kg/m2  SpO2 96% Patient is an obese WF in no acute distress.  HEENT is unremarkable. Normocephalic/atraumatic. PERRL. Sclera are nonicteric. Neck is supple. No masses. No JVD. Lungs are clear. Cardiac exam shows a  regular rate and rhythm. She has a 2/6 harsh outflow murmur radiating to the carotids. Abdomen is obese but soft. Extremities are without edema. Pedal pulses are nonpalpable. Gait and ROM are intact. No gross neurologic deficits noted.   LABORATORY DATA: Lab Results  Component Value Date   CHOL 149 02/04/2014   HDL 60 02/04/2014   LDLCALC 64 02/04/2014   TRIG 124 02/04/2014   CHOLHDL 2.5 02/04/2014   Lab Results  Component Value Date   WBC 9.4 09/26/2014   HGB 15.2*  09/26/2014   HCT 45.1 09/26/2014   PLT 213 09/26/2014   GLUCOSE 59* 09/26/2014   CHOL 149 02/04/2014   TRIG 124 02/04/2014   HDL 60 02/04/2014   LDLCALC 64 02/04/2014   ALT 9 09/26/2014   AST 18 09/26/2014   NA 142 09/26/2014   K 3.3* 09/26/2014   CL 104 09/26/2014   CREATININE 0.62 09/26/2014   BUN 7 09/26/2014   CO2 24 09/26/2014   TSH 2.52 07/04/2014   INR 1.0 10/16/2006      Assessment / Plan: 1. Coronary disease status post CABG. Redo coronary bypass surgery in 2001 with sequential saphenous vein graft to the first and second obtuse marginal vessels and a free RIMA graft to the LAD. Prior LIMA graft to the proximal LAD. Myoview study January 2016 was stable showing anterior ischemia similar to prior studies.  Cardiac cath in 2008 showed patent grafts. Asymptomatic. Continue aspirin, beta blocker, and statin therapy.   2. PAD with right iliac occlusion. Stable claudication. Dopplers in June 2016 showed improvement in right ABI from 0.6>0.71  3. Abdominal aortic aneurysm measuring 3.2 cm. Follow up yearly  4. Hypertension, controlled today. I really think her wrist monitor is inaccurate. Blood pressure normal today and equal in both arms. Will continue Bystolic. She is going to get a new monitor. I encouraged her to bring this on her next visit.   5. Tobacco abuse-encouraged complete cessation.   6. Obesity.  7. Diarrhea- Lymphocytic colitis. Per Dr. Gala Romney.  8. Aortic stenosis- mild per Echo in 2012.  Consider repeat Echo next year.  9. Carotid bruits versus radiated murmur from AS. With extensive vascular history will check carotid dopplers.

## 2015-01-16 NOTE — Patient Instructions (Signed)
Continue your current therapy  We will schedule you for carotid dopplers.  Get a new blood pressure monitor- arm cuff and bring to your next visit.  I will see you in 4 months.

## 2015-01-20 ENCOUNTER — Encounter: Payer: Self-pay | Admitting: Internal Medicine

## 2015-01-20 ENCOUNTER — Ambulatory Visit (INDEPENDENT_AMBULATORY_CARE_PROVIDER_SITE_OTHER): Payer: Medicare Other | Admitting: Internal Medicine

## 2015-01-20 VITALS — BP 135/70 | HR 71 | Temp 98.6°F | Ht 67.0 in | Wt 197.0 lb

## 2015-01-20 DIAGNOSIS — K219 Gastro-esophageal reflux disease without esophagitis: Secondary | ICD-10-CM | POA: Diagnosis not present

## 2015-01-20 DIAGNOSIS — R634 Abnormal weight loss: Secondary | ICD-10-CM

## 2015-01-20 DIAGNOSIS — K52832 Lymphocytic colitis: Secondary | ICD-10-CM | POA: Diagnosis not present

## 2015-01-20 NOTE — Progress Notes (Signed)
Primary Care Physician:  [name], MD Primary Gastroenterologist:  Dr. Gala Romney  Pre-Procedure History & Physical: HPI:  [name] is a 75 y.o. female here for follow-up biopsy-proven lymphocytic colitis and GERD. She was tapered down on the budesonide last fall;  Relapsed in November; Went back up to 9 mg daily in November and dropped back to 6 mg one month ago. She continues on 6 mg daily;  Patient states she has 2-3 formed bowel movements daily. Only rarely has an episode of loose stool. She feels well; no abdominal pain - her oral intake has picked up. It is notable that she has lost 17 pounds since her last office visit. She seen her PCP and cardiologist since last seen here.  She's no longer short of breath    Past Medical History  Diagnosis Date  . Hyperlipidemia   . Hypertension   . Ischemic heart disease     had redo bypass surgery in 2001, at that time she had a free right internal mammary to the LAD, LAD endarterectomy, sequential vein graft to the OM1 and OM2 -- last catheterization was in 2008  . Hypothyroidism   . Obesity     with gastric bypass surgery in 2004  . History of tobacco abuse     Recurrent cigarette smoking  . Abdominal aortic aneurysm ([place]) 2/12    infrarenal diagnosed with maximum measurement at 3.3 cm  . GERD (gastroesophageal reflux disease)   . Depression   . Leg fracture     remote right leg fracture  . PAD (peripheral artery disease) ([place]) 04/13/2011    right iliac occlusion  . Diverticulosis   . Hyperplastic colon polyp   . PONV (postoperative nausea and vomiting)   . Microscopic colitis     Past Surgical History  Procedure Laterality Date  . Gastric bypass  2004  . Carpal tunnel release    . Coronary artery bypass graft  1985  . Coronary artery bypass graft  03/09/1999    Redo CABG x3 --  with the free right internal mammary artey to the LAD with an LAD endarterectomy -- Sequential saphenous vein graft to OM1 and OM2                    . Cardiac catheterization  02/16/1999    Left heart catheterization with selective coronary  angiography, left ventricular angiography (RAO and LAO views), angiography of left internal mammary artery, and angiography of saphenous vein grafts  . Cardiac catheterization  10/20/2006    Est. EF of 55% -- Totally occuladed native coronary circulation -- Persistent patency of the right mammary graft and the distal LAD artery with peristent patency of the left mammary graft to the proximal LAD artery with persistant patency of the saphenous vein graft to the sequentail branches of the OM        . Cholecystectomy    . Colonoscopy  07/12/2007    Dr.Brodie- normal cecum and rectum, diverticulosis in the sigmoid colon  . Colonoscopy  1991    per Dr.Brodie office visit note= hyperplastic polyp  . Colonoscopy N/A 09/30/2014    Dr.Ezabella Teska- noraml appearing rectal mucosa, scattered L sided diverticula, colonic mucosa has a somewhat pale friable appearence diffusely, no ulcers or erosions seen, the distal 10cm of terminal ileum appeared entirely normal. bx=benign colorectal mucosa with lymphocytic colitis  . Esophagogastroduodenoscopy N/A 09/30/2014    Dr.Channel Papandrea-mild erosive reflux esophagitis s/p bariatric surgery    Prior to Admission  medications   Medication Sig Start Date End Date Taking? Authorizing Provider  aspirin 325 MG tablet Take 81 mg by mouth daily.    Yes Historical Provider, MD  budesonide (ENTOCORT EC) 3 MG 24 hr capsule Take 3 capsules (9 mg total) by mouth daily. 11/24/14  Yes Daneil Dolin, MD  diphenoxylate-atropine (LOMOTIL) 2.5-0.025 MG per tablet Take 2 tablets by mouth 3 (three) times daily. 07/16/14  Yes Lafayette Dragon, MD  FLUoxetine (PROZAC) 40 MG capsule TAKE ONE CAPSULE BY MOUTH ONE TIME DAILY  04/04/14  Yes Burtis Junes, NP  FLUZONE HIGH-DOSE 0.5 ML SUSY TO BE ADMINISTERED BY PHARMACIST FOR IMMUNIZATION 10/14/14  Yes Historical Provider, MD  hydrochlorothiazide (HYDRODIURIL) 25 MG  tablet Take 25 mg by mouth daily as needed (fluid). prn   Yes Historical Provider, MD  levothyroxine (SYNTHROID) 50 MCG tablet Take 50 mcg daily and take extra 1/2 tablet once a week Patient taking differently: Take 50 mcg by mouth daily before breakfast.  07/30/14  Yes Peter M Martinique, MD  loperamide (IMODIUM) 2 MG capsule Take by mouth as needed for diarrhea or loose stools.   Yes Historical Provider, MD  Nebivolol HCl (BYSTOLIC) 20 MG TABS Take 1 tablet (20 mg total) by mouth daily. 01/16/15  Yes Peter M Martinique, MD  nitroGLYCERIN (NITROSTAT) 0.4 MG SL tablet Place 1 tablet (0.4 mg total) under the tongue every 5 (five) minutes as needed. 03/11/11  Yes Peter M Martinique, MD  pantoprazole (PROTONIX) 40 MG tablet Take 1 tablet (40 mg total) by mouth daily. 11/04/14  Yes Daneil Dolin, MD  simvastatin (ZOCOR) 40 MG tablet Take 1 tablet (40 mg total) by mouth at bedtime. 11/01/13  Yes Peter M Martinique, MD  potassium chloride (K-DUR) 10 MEQ tablet Take 1 tablet (10 mEq total) by mouth daily. Patient not taking: Reported on 01/20/2015 09/29/14   Daneil Dolin, MD    Allergies as of 01/20/2015 - Review Complete 01/20/2015  Allergen Reaction Noted  . Dipyridamole Hives 01/16/2015  . Varenicline Hives 01/16/2015  . Chantix [varenicline tartrate]  03/08/2011  . Dipyridamole  08/10/2010    Family History  Problem Relation Age of Onset  . Stroke Father   . Diabetes Mother   . Pancreatic cancer Mother     Social History   Social History  . Marital Status: Married    Spouse Name: N/A  . Number of Children: N/A  . Years of Education: N/A   Occupational History  . Not on file.   Social History Main Topics  . Smoking status: Current Some Day Smoker -- 0.50 packs/day    Types: Cigarettes    Last Attempt to Quit: 01/10/1985  . Smokeless tobacco: Not on file  . Alcohol Use: No  . Drug Use: No  . Sexual Activity: No   Other Topics Concern  . Not on file   Social History Narrative    Review of  Systems: See HPI, otherwise negative ROS  Physical Exam: BP 135/70 mmHg  Pulse 71  Temp(Src) 98.6 F (37 C) (Oral)  Ht 5\' 7"  (1.702 m)  Wt 197 lb (89.359 kg)  BMI 30.85 kg/m2 General:   Alert,  pleasant and cooperative in NAD Skin:  Intact without significant lesions or rashes. Eyes:  Sclera clear, no icterus.   Conjunctiva pink. Lungs:  Clear throughout to auscultation.   No wheezes, crackles, or rhonchi. No acute distress. Heart:  Regular rate and rhythm; no murmurs, clicks, rubs,  or gallops.  Abdomen: Non-distended, normal bowel sounds.  Soft and nontender without appreciable mass or hepatosplenomegaly.    Impression:  75 year old lady with biopsy-proven lymphocytic colitis -  responded nicely to Entocort initially but relapsed on taper. She's back budesonide 6 mg daily with clinical remission. I suspect we need a much slower taper moving forward. Significant change in weight noted. I wonder if some of the weight loss is related to fluid issues. Clinically, doing very well from a GI standpoint at this time.  GERD symptoms well controlled on Protonix once daily to twice daily. Dexilant was too expensive.  Recommendations:  Continue Entocort 2 tablets (6 mg) daily for 3 more months  Continue Protonix 40 mg daily to twice daily  Minimize use of NSAID agents  Office visit with me in 3 months   Notice: This dictation was prepared with Dragon dictation along with smaller phrase technology. Any transcriptional errors that result from this process are unintentional and may not be corrected upon review.

## 2015-01-20 NOTE — Patient Instructions (Addendum)
Continue Entocort 2 tablets (6 mg) daily for 3 more months  Continue Protonix 40 mg daily to twice daily  Minimize use of NSAID agents  Office visit with me in 3 months

## 2015-01-28 ENCOUNTER — Other Ambulatory Visit: Payer: Self-pay | Admitting: *Deleted

## 2015-01-28 MED ORDER — SIMVASTATIN 40 MG PO TABS: 40.0000 mg | ORAL_TABLET | Freq: Every day | ORAL | Status: DC

## 2015-02-06 ENCOUNTER — Other Ambulatory Visit: Payer: Self-pay | Admitting: Cardiology

## 2015-02-06 NOTE — Telephone Encounter (Signed)
Rx request sent to pharmacy.  

## 2015-02-16 ENCOUNTER — Ambulatory Visit (HOSPITAL_COMMUNITY)
Admission: RE | Admit: 2015-02-16 | Discharge: 2015-02-16 | Disposition: A | Discharge status: Home / Self Care | Payer: Medicare Other | Source: Ambulatory Visit | Attending: Cardiovascular Disease | Admitting: Cardiovascular Disease

## 2015-02-16 DIAGNOSIS — I6523 Occlusion and stenosis of bilateral carotid arteries: Secondary | ICD-10-CM | POA: Diagnosis not present

## 2015-02-16 DIAGNOSIS — I10 Essential (primary) hypertension: Secondary | ICD-10-CM | POA: Diagnosis not present

## 2015-02-16 DIAGNOSIS — E785 Hyperlipidemia, unspecified: Secondary | ICD-10-CM | POA: Diagnosis not present

## 2015-02-16 DIAGNOSIS — I739 Peripheral vascular disease, unspecified: Secondary | ICD-10-CM | POA: Insufficient documentation

## 2015-02-16 DIAGNOSIS — I714 Abdominal aortic aneurysm, without rupture, unspecified: Secondary | ICD-10-CM

## 2015-02-16 DIAGNOSIS — I350 Nonrheumatic aortic (valve) stenosis: Secondary | ICD-10-CM | POA: Diagnosis not present

## 2015-02-16 DIAGNOSIS — R0989 Other specified symptoms and signs involving the circulatory and respiratory systems: Secondary | ICD-10-CM | POA: Insufficient documentation

## 2015-02-16 DIAGNOSIS — I25810 Atherosclerosis of coronary artery bypass graft(s) without angina pectoris: Secondary | ICD-10-CM | POA: Diagnosis not present

## 2015-03-02 ENCOUNTER — Telehealth: Payer: Self-pay | Admitting: Internal Medicine

## 2015-03-02 DIAGNOSIS — K52832 Lymphocytic colitis: Secondary | ICD-10-CM

## 2015-03-02 MED ORDER — BUDESONIDE 3 MG PO CPEP: 9.0000 mg | ORAL_CAPSULE | Freq: Every day | ORAL | Status: DC

## 2015-03-02 NOTE — Telephone Encounter (Signed)
Please notify the patient that her refill was sent to the pharmacy as requested. Provided 1 month and 1 refill which should last her until her follow-up with Dr. Gala Romney.

## 2015-03-02 NOTE — Telephone Encounter (Signed)
[name], pt is supposed to come back in April but she is not scheduled yet. Will you make sure she has an appt with RMR please.

## 2015-03-02 NOTE — Telephone Encounter (Signed)
Pt called to say that she is needing her budesonide EC 3mg  called into [facility] on [address] in [place]. She said that they told her they had already faxed [place] something.

## 2015-03-02 NOTE — Telephone Encounter (Signed)
Pt is aware.  

## 2015-03-02 NOTE — Telephone Encounter (Signed)
No refill request in epic. Routing to the refill box. Per RMR last ov note (Jan 2017), she was to continue this for 3 more months.

## 2015-03-02 NOTE — Telephone Encounter (Signed)
SHE IS ON THE April RECALL

## 2015-03-25 ENCOUNTER — Other Ambulatory Visit: Payer: Self-pay | Admitting: Nurse Practitioner

## 2015-04-27 ENCOUNTER — Other Ambulatory Visit: Payer: Self-pay | Admitting: Nurse Practitioner

## 2015-05-01 ENCOUNTER — Encounter: Payer: Self-pay | Admitting: Internal Medicine

## 2015-05-01 ENCOUNTER — Ambulatory Visit (INDEPENDENT_AMBULATORY_CARE_PROVIDER_SITE_OTHER): Payer: Medicare Other | Admitting: Internal Medicine

## 2015-05-01 VITALS — BP 143/67 | HR 59 | Temp 98.1°F | Ht 67.0 in | Wt 195.2 lb

## 2015-05-01 DIAGNOSIS — K52839 Microscopic colitis, unspecified: Secondary | ICD-10-CM | POA: Diagnosis not present

## 2015-05-01 DIAGNOSIS — K219 Gastro-esophageal reflux disease without esophagitis: Secondary | ICD-10-CM

## 2015-05-01 NOTE — Progress Notes (Signed)
Primary Care Physician:  [name], MD Primary Gastroenterologist:  Dr. Gala Romney Pre-Procedure History & Physical: HPI:  [name] is a 75 y.o. female here for ymphocytic colitis. Continues on Entocort 6 mg daily.bowel symptoms have resolved on the 2 formed bowel movements daily.no abdominal cramps no urgency no diarrhea. She is very happy. Reflux symptoms well controlled on Protonix 40 mg daily. She's been able lose another 2 pounds consciously try to become a more healthy individual. However, she continues to smoke.  Past Medical History  Diagnosis Date  . Hyperlipidemia   . Hypertension   . Ischemic heart disease     had redo bypass surgery in 2001, at that time she had a free right internal mammary to the LAD, LAD endarterectomy, sequential vein graft to the OM1 and OM2 -- last catheterization was in 2008  . Hypothyroidism   . Obesity     with gastric bypass surgery in 2004  . History of tobacco abuse     Recurrent cigarette smoking  . Abdominal aortic aneurysm ([place]) 2/12    infrarenal diagnosed with maximum measurement at 3.3 cm  . GERD (gastroesophageal reflux disease)   . Depression   . Leg fracture     remote right leg fracture  . PAD (peripheral artery disease) ([place]) 04/13/2011    right iliac occlusion  . Diverticulosis   . Hyperplastic colon polyp   . PONV (postoperative nausea and vomiting)   . Microscopic colitis     Past Surgical History  Procedure Laterality Date  . Gastric bypass  2004  . Carpal tunnel release    . Coronary artery bypass graft  1985  . Coronary artery bypass graft  03/09/1999    Redo CABG x3 --  with the free right internal mammary artey to the LAD with an LAD endarterectomy -- Sequential saphenous vein graft to OM1 and OM2                   . Cardiac catheterization  02/16/1999    Left heart catheterization with selective coronary  angiography, left ventricular angiography (RAO and LAO views), angiography of left internal mammary  artery, and angiography of saphenous vein grafts  . Cardiac catheterization  10/20/2006    Est. EF of 55% -- Totally occuladed native coronary circulation -- Persistent patency of the right mammary graft and the distal LAD artery with peristent patency of the left mammary graft to the proximal LAD artery with persistant patency of the saphenous vein graft to the sequentail branches of the OM        . Cholecystectomy    . Colonoscopy  07/12/2007    Dr.Brodie- normal cecum and rectum, diverticulosis in the sigmoid colon  . Colonoscopy  1991    per Dr.Brodie office visit note= hyperplastic polyp  . Colonoscopy N/A 09/30/2014    Dr.Travion Ke- noraml appearing rectal mucosa, scattered L sided diverticula, colonic mucosa has a somewhat pale friable appearence diffusely, no ulcers or erosions seen, the distal 10cm of terminal ileum appeared entirely normal. bx=benign colorectal mucosa with lymphocytic colitis  . Esophagogastroduodenoscopy N/A 09/30/2014    Dr.Jabrea Kallstrom-mild erosive reflux esophagitis s/p bariatric surgery    Prior to Admission medications   Medication Sig Start Date End Date Taking? Authorizing Provider  aspirin 325 MG tablet Take 81 mg by mouth daily.    Yes Historical Provider, MD  budesonide (ENTOCORT EC) 3 MG 24 hr capsule Take 2 capsules (6 mg total) by mouth daily. 04/28/15  Yes  Mahala Menghini, PA-C  FLUoxetine (PROZAC) 40 MG capsule TAKE ONE CAPSULE BY MOUTH ONE TIME DAILY 03/25/15  Yes Burtis Junes, NP  hydrochlorothiazide (HYDRODIURIL) 25 MG tablet Take 25 mg by mouth daily as needed (fluid). prn   Yes Historical Provider, MD  levothyroxine (SYNTHROID) 50 MCG tablet Take 50 mcg daily and take extra 1/2 tablet once a week Patient taking differently: Take 50 mcg by mouth daily before breakfast.  07/30/14  Yes Peter M Martinique, MD  Nebivolol HCl (BYSTOLIC) 20 MG TABS Take 1 tablet (20 mg total) by mouth daily. 01/16/15  Yes Peter M Martinique, MD  nitroGLYCERIN (NITROSTAT) 0.4 MG SL tablet Place 1  tablet (0.4 mg total) under the tongue every 5 (five) minutes as needed. 03/11/11  Yes Peter M Martinique, MD  pantoprazole (PROTONIX) 40 MG tablet Take 1 tablet (40 mg total) by mouth daily. 11/04/14  Yes Daneil Dolin, MD  simvastatin (ZOCOR) 40 MG tablet Take 1 tablet (40 mg total) by mouth at bedtime. 01/28/15  Yes Peter M Martinique, MD  atenolol (TENORMIN) 50 MG tablet TAKE 1 TABLET AT BEDTIME Patient not taking: Reported on 05/01/2015 02/06/15   Peter M Martinique, MD  diphenoxylate-atropine (LOMOTIL) 2.5-0.025 MG per tablet Take 2 tablets by mouth 3 (three) times daily. Patient not taking: Reported on 05/01/2015 07/16/14   Lafayette Dragon, MD  loperamide (IMODIUM) 2 MG capsule Take by mouth as needed for diarrhea or loose stools. Reported on 05/01/2015    Historical Provider, MD    Allergies as of 05/01/2015 - Review Complete 05/01/2015  Allergen Reaction Noted  . Dipyridamole Hives 01/16/2015  . Varenicline Hives 01/16/2015  . Chantix [varenicline tartrate]  03/08/2011  . Dipyridamole  08/10/2010    Family History  Problem Relation Age of Onset  . Stroke Father   . Diabetes Mother   . Pancreatic cancer Mother     Social History   Social History  . Marital Status: Married    Spouse Name: N/A  . Number of Children: N/A  . Years of Education: N/A   Occupational History  . Not on file.   Social History Main Topics  . Smoking status: Current Some Day Smoker -- 0.50 packs/day    Types: Cigarettes    Last Attempt to Quit: 01/10/1985  . Smokeless tobacco: Not on file  . Alcohol Use: No  . Drug Use: No  . Sexual Activity: No   Other Topics Concern  . Not on file   Social History Narrative    Review of Systems: See HPI, otherwise negative ROS  Physical Exam: BP 143/67 mmHg  Pulse 59  Temp(Src) 98.1 F (36.7 C) (Oral)  Ht 5\' 7"  (1.702 m)  Wt 195 lb 3.2 oz (88.542 kg)  BMI 30.57 kg/m2 General:   Alert,  Well-developed, well-nourished, pleasant and cooperative in NAD Skin:   Intact without significant lesions or rashes. Neck:  Supple; no masses or thyromegaly. No significant cervical adenopathy. Lungs:  Chronic rhonchi throughout both lungs fields..   Heart:  Regular rate and rhythm; no murmurs, clicks, rubs,  or gallops. Abdomen: Non-distended, normal bowel sounds.  Soft and nontender without appreciable mass or hepatosplenomegaly.  Pulses:  Normal pulses noted. Extremities:  Without clubbing or edema.   Impression:   Pleasant 75 year old lady with multiple medical problems diagnosed with lymphocytic colitis now in remission on 6 mg daily. She's been on Entocort at this dose for 4 months;  we can probably slowly further taper at this  time. GERD under good control with Protonix once daily. She was committed for weight loss.   Recommendations:   Decrease Entocort to 3 mg (one tablet) daily  Call me if you develop any interim problems  Continue Protonix 40 mg daily  Office visit with me in 3 months  See Dr. Melford Aase about smoking cessation    Notice: This dictation was prepared with Dragon dictation along with smaller phrase technology. Any transcriptional errors that result from this process are unintentional and may not be corrected upon review.

## 2015-05-01 NOTE — Patient Instructions (Signed)
Decrease Entocort to 3 mg (one tablet) daily  Call me if you develop any interim problems  Continue Protonix 40 mg daily  Office visit with me in 3 months  See Dr. Melford Aase about smoking cessation

## 2015-05-28 ENCOUNTER — Ambulatory Visit (INDEPENDENT_AMBULATORY_CARE_PROVIDER_SITE_OTHER): Payer: Medicare Other | Admitting: Cardiology

## 2015-05-28 ENCOUNTER — Encounter: Payer: Self-pay | Admitting: Cardiology

## 2015-05-28 VITALS — BP 142/74 | HR 60 | Ht 67.0 in | Wt 199.0 lb

## 2015-05-28 DIAGNOSIS — I2510 Atherosclerotic heart disease of native coronary artery without angina pectoris: Secondary | ICD-10-CM | POA: Diagnosis not present

## 2015-05-28 DIAGNOSIS — Z720 Tobacco use: Secondary | ICD-10-CM

## 2015-05-28 DIAGNOSIS — I739 Peripheral vascular disease, unspecified: Secondary | ICD-10-CM

## 2015-05-28 DIAGNOSIS — I10 Essential (primary) hypertension: Secondary | ICD-10-CM

## 2015-05-28 DIAGNOSIS — I714 Abdominal aortic aneurysm, without rupture, unspecified: Secondary | ICD-10-CM

## 2015-05-28 DIAGNOSIS — I779 Disorder of arteries and arterioles, unspecified: Secondary | ICD-10-CM

## 2015-05-28 MED ORDER — FUROSEMIDE 20 MG PO TABS: 20.0000 mg | ORAL_TABLET | Freq: Every day | ORAL | Status: DC | PRN

## 2015-05-28 NOTE — Progress Notes (Signed)
[name] Date of Birth: 1940/07/18   History of Present Illness: [name] is seen today for follow up CAD and PAD. She has known CAD with redo CABG in 2001.  She has a known abdominal aortic aneurysm with last ultrasound  showing a  3.2 cm aneurysm in June 2016. She has a history of PAD with right iliac occlusion. Last ABIs in June 2016 showed improvement in right ABI from 0.6 to .71.  She has chronic claudication symptoms with pain in right thigh with walking. This has not changed.[name] Her last nuclear stress test in January 2016 showed  anterior wall ischemia. This was unchanged from 2012 and 2013. She had a cardiac cath in 2012 which  showed patent grafts.  On followup today she denies chest pain or SOB. Her BP has been under good control since she was switched to Bystolic. She is trying to quit smoking and is using Nicorette gum.  She has  lymphocytic colitis. Followed by Dr. Gala Romney. This is well controlled now and she is down to one steroid pill a day.  She notes her husband was admitted with a CVA yesterday.    Current Outpatient Prescriptions on File Prior to Visit  Medication Sig Dispense Refill  . aspirin 325 MG tablet Take 81 mg by mouth daily.     . budesonide (ENTOCORT EC) 3 MG 24 hr capsule Take 2 capsules (6 mg total) by mouth daily. 60 capsule 1  . diphenoxylate-atropine (LOMOTIL) 2.5-0.025 MG per tablet Take 2 tablets by mouth 3 (three) times daily. 100 tablet 1  . FLUoxetine (PROZAC) 40 MG capsule TAKE ONE CAPSULE BY MOUTH ONE TIME DAILY 90 capsule 1  . levothyroxine (SYNTHROID) 50 MCG tablet Take 50 mcg daily and take extra 1/2 tablet once a week (Patient taking differently: Take 50 mcg by mouth daily before breakfast. ) 96 tablet 3  . loperamide (IMODIUM) 2 MG capsule Take by mouth as needed for diarrhea or loose stools. Reported on 05/01/2015    . Nebivolol HCl (BYSTOLIC) 20 MG TABS Take 1 tablet (20 mg total) by mouth daily. 90 tablet 3  . nitroGLYCERIN (NITROSTAT) 0.4 MG  SL tablet Place 1 tablet (0.4 mg total) under the tongue every 5 (five) minutes as needed. 25 tablet 11  . pantoprazole (PROTONIX) 40 MG tablet Take 1 tablet (40 mg total) by mouth daily. 30 tablet 11  . simvastatin (ZOCOR) 40 MG tablet Take 1 tablet (40 mg total) by mouth at bedtime. 90 tablet 1   No current facility-administered medications on file prior to visit.    Allergies  Allergen Reactions  . Dipyridamole Hives  . Varenicline Hives  . Chantix [Varenicline Tartrate]   . Dipyridamole     Past Medical History  Diagnosis Date  . Hyperlipidemia   . Hypertension   . Ischemic heart disease     had redo bypass surgery in 2001, at that time she had a free right internal mammary to the LAD, LAD endarterectomy, sequential vein graft to the OM1 and OM2 -- last catheterization was in 2008  . Hypothyroidism   . Obesity     with gastric bypass surgery in 2004  . History of tobacco abuse     Recurrent cigarette smoking  . Abdominal aortic aneurysm ([place]) 2/12    infrarenal diagnosed with maximum measurement at 3.3 cm  . GERD (gastroesophageal reflux disease)   . Depression   . Leg fracture     remote right leg fracture  .  PAD (peripheral artery disease) ([place]) 04/13/2011    right iliac occlusion  . Diverticulosis   . Hyperplastic colon polyp   . PONV (postoperative nausea and vomiting)   . Microscopic colitis     Past Surgical History  Procedure Laterality Date  . Gastric bypass  2004  . Carpal tunnel release    . Coronary artery bypass graft  1985  . Coronary artery bypass graft  03/09/1999    Redo CABG x3 --  with the free right internal mammary artey to the LAD with an LAD endarterectomy -- Sequential saphenous vein graft to OM1 and OM2                   . Cardiac catheterization  02/16/1999    Left heart catheterization with selective coronary  angiography, left ventricular angiography (RAO and LAO views), angiography of left internal mammary artery, and angiography of  saphenous vein grafts  . Cardiac catheterization  10/20/2006    Est. EF of 55% -- Totally occuladed native coronary circulation -- Persistent patency of the right mammary graft and the distal LAD artery with peristent patency of the left mammary graft to the proximal LAD artery with persistant patency of the saphenous vein graft to the sequentail branches of the OM        . Cholecystectomy    . Colonoscopy  07/12/2007    Dr.Brodie- normal cecum and rectum, diverticulosis in the sigmoid colon  . Colonoscopy  1991    per Dr.Brodie office visit note= hyperplastic polyp  . Colonoscopy N/A 09/30/2014    Dr.Rourk- noraml appearing rectal mucosa, scattered L sided diverticula, colonic mucosa has a somewhat pale friable appearence diffusely, no ulcers or erosions seen, the distal 10cm of terminal ileum appeared entirely normal. bx=benign colorectal mucosa with lymphocytic colitis  . Esophagogastroduodenoscopy N/A 09/30/2014    Dr.Rourk-mild erosive reflux esophagitis s/p bariatric surgery    History  Smoking status  . Current Some Day Smoker -- 0.50 packs/day  . Types: Cigarettes  . Last Attempt to Quit: 01/10/1985  Smokeless tobacco  . Not on file    History  Alcohol Use No    Family History  Problem Relation Age of Onset  . Stroke Father   . Diabetes Mother   . Pancreatic cancer Mother     Review of Systems: The review of systems is as above.  All other systems were reviewed and are negative.  Physical Exam: BP 142/74 mmHg  Pulse 60  Ht 5\' 7"  (1.702 m)  Wt 90.266 kg (199 lb)  BMI 31.16 kg/m2 Patient is an obese WF in no acute distress.  HEENT is unremarkable. Normocephalic/atraumatic. PERRL. Sclera are nonicteric. Neck is supple. No masses. Bilateral bruits. No JVD. Lungs are clear. Cardiac exam shows a regular rate and rhythm. She has a 2/6 harsh outflow murmur radiating to the carotids. Abdomen is obese but soft. Extremities are without edema. Pedal pulses are nonpalpable. Feet are  warm and dry. Gait and ROM are intact. No gross neurologic deficits noted.   LABORATORY DATA: Lab Results  Component Value Date   CHOL 149 02/04/2014   HDL 60 02/04/2014   LDLCALC 64 02/04/2014   TRIG 124 02/04/2014   CHOLHDL 2.5 02/04/2014   Lab Results  Component Value Date   WBC 9.4 09/26/2014   HGB 15.2* 09/26/2014   HCT 45.1 09/26/2014   PLT 213 09/26/2014   GLUCOSE 59* 09/26/2014   CHOL 149 02/04/2014   TRIG 124 02/04/2014   HDL  60 02/04/2014   LDLCALC 64 02/04/2014   ALT 9 09/26/2014   AST 18 09/26/2014   NA 142 09/26/2014   K 3.3* 09/26/2014   CL 104 09/26/2014   CREATININE 0.62 09/26/2014   BUN 7 09/26/2014   CO2 24 09/26/2014   TSH 2.52 07/04/2014   INR 1.0 10/16/2006   Ecg today shows NSR with LVH with QRS widening. Possible old anterior infarct. I have personally reviewed and interpreted this study.  Carotid dopplers 2/17 show bilateral 40-59% disease.    Assessment / Plan: 1. Coronary disease status post CABG. Redo coronary bypass surgery in 2001 with sequential saphenous vein graft to the first and second obtuse marginal vessels and a free RIMA graft to the LAD. Prior LIMA graft to the proximal LAD. Myoview study January 2016 was stable showing anterior ischemia similar to prior studies.  Cardiac cath in 2008 showed patent grafts. Asymptomatic. Continue aspirin, beta blocker, and statin therapy.   2. PAD with right iliac occlusion. Stable claudication. Dopplers in June 2016 showed improvement in right ABI from 0.6>0.71. Plan to repeat dopplers in July.   3. Abdominal aortic aneurysm measuring 3.2 cm. Follow up yearly- July.   4. Hypertension, controlled today.   5. Tobacco abuse-encouraged complete cessation.   6. Obesity.  7. Lymphocytic colitis. Per Dr. Gala Romney.  8. Aortic stenosis- mild per Echo in 2012. Consider repeat Echo next year.  9. Carotid arterial disease- moderate. Repeat doppler yearly.

## 2015-05-28 NOTE — Patient Instructions (Addendum)
We will schedule abdominal ultrasound and lower extremity arterial dopplers in July  Quit smoking  Continue your current medications  I will see you in 6 months.

## 2015-06-17 ENCOUNTER — Encounter: Payer: Self-pay | Admitting: Internal Medicine

## 2015-07-06 ENCOUNTER — Telehealth: Payer: Self-pay | Admitting: Internal Medicine

## 2015-07-06 MED ORDER — BUDESONIDE 3 MG PO CPEP: 3.0000 mg | ORAL_CAPSULE | Freq: Every day | ORAL | Status: DC

## 2015-07-06 NOTE — Telephone Encounter (Signed)
[phone]  PLEASE CALL PATIENT SHE WANTS TO KNOW IF 30 TABLETS OF THE BUDESONIDE CAN BE CALLED INTO HER PHARMACY

## 2015-07-06 NOTE — Telephone Encounter (Signed)
Per last note, continue Entocort (budesonide) 3 mg. OV scheduled with RMR early August. I will sent in a refill to get her to her appointment at which point Dr. Gala Romney can decide if it needs to be continued or further tapered. Please notify the patient.

## 2015-07-06 NOTE — Addendum Note (Signed)
Addended by: Gordy Levan, ERIC A on: 07/06/2015 03:37 PM   Modules accepted: Orders

## 2015-07-06 NOTE — Telephone Encounter (Signed)
Routing to the refill box. 

## 2015-07-29 ENCOUNTER — Ambulatory Visit (HOSPITAL_COMMUNITY)
Admission: RE | Admit: 2015-07-29 | Discharge: 2015-07-29 | Disposition: A | Discharge status: Home / Self Care | Payer: Medicare Other | Source: Ambulatory Visit | Attending: Urology | Admitting: Urology

## 2015-07-29 ENCOUNTER — Ambulatory Visit (HOSPITAL_COMMUNITY)
Admission: RE | Admit: 2015-07-29 | Discharge: 2015-07-29 | Disposition: A | Discharge status: Home / Self Care | Payer: Medicare Other | Source: Ambulatory Visit | Attending: Cardiology | Admitting: Cardiology

## 2015-07-29 ENCOUNTER — Other Ambulatory Visit: Payer: Self-pay

## 2015-07-29 DIAGNOSIS — E785 Hyperlipidemia, unspecified: Secondary | ICD-10-CM | POA: Diagnosis not present

## 2015-07-29 DIAGNOSIS — I10 Essential (primary) hypertension: Secondary | ICD-10-CM | POA: Insufficient documentation

## 2015-07-29 DIAGNOSIS — R938 Abnormal findings on diagnostic imaging of other specified body structures: Secondary | ICD-10-CM | POA: Diagnosis not present

## 2015-07-29 DIAGNOSIS — K219 Gastro-esophageal reflux disease without esophagitis: Secondary | ICD-10-CM | POA: Diagnosis not present

## 2015-07-29 DIAGNOSIS — I714 Abdominal aortic aneurysm, without rupture, unspecified: Secondary | ICD-10-CM

## 2015-07-29 DIAGNOSIS — I739 Peripheral vascular disease, unspecified: Secondary | ICD-10-CM | POA: Diagnosis not present

## 2015-07-29 DIAGNOSIS — I700 Atherosclerosis of aorta: Secondary | ICD-10-CM | POA: Insufficient documentation

## 2015-07-29 DIAGNOSIS — I259 Chronic ischemic heart disease, unspecified: Secondary | ICD-10-CM | POA: Insufficient documentation

## 2015-07-29 DIAGNOSIS — I708 Atherosclerosis of other arteries: Secondary | ICD-10-CM | POA: Insufficient documentation

## 2015-07-29 DIAGNOSIS — E039 Hypothyroidism, unspecified: Secondary | ICD-10-CM | POA: Diagnosis not present

## 2015-07-29 DIAGNOSIS — Z720 Tobacco use: Secondary | ICD-10-CM | POA: Diagnosis not present

## 2015-07-29 DIAGNOSIS — I745 Embolism and thrombosis of iliac artery: Secondary | ICD-10-CM | POA: Insufficient documentation

## 2015-07-29 DIAGNOSIS — F329 Major depressive disorder, single episode, unspecified: Secondary | ICD-10-CM | POA: Insufficient documentation

## 2015-08-03 ENCOUNTER — Other Ambulatory Visit: Payer: Self-pay

## 2015-08-03 MED ORDER — SIMVASTATIN 40 MG PO TABS: 40.0000 mg | ORAL_TABLET | Freq: Every day | ORAL | 1 refills | Status: DC

## 2015-08-25 ENCOUNTER — Ambulatory Visit: Payer: Medicare Other | Admitting: Internal Medicine

## 2015-08-27 ENCOUNTER — Encounter: Payer: Self-pay | Admitting: Internal Medicine

## 2015-08-28 ENCOUNTER — Ambulatory Visit: Payer: Medicare Other | Admitting: Internal Medicine

## 2015-09-21 ENCOUNTER — Other Ambulatory Visit: Payer: Self-pay | Admitting: Cardiology

## 2015-09-21 NOTE — Telephone Encounter (Signed)
REFILL 

## 2015-09-22 ENCOUNTER — Ambulatory Visit: Payer: Medicare Other | Admitting: Internal Medicine

## 2015-09-29 ENCOUNTER — Encounter: Payer: Self-pay | Admitting: Internal Medicine

## 2015-09-29 ENCOUNTER — Ambulatory Visit (INDEPENDENT_AMBULATORY_CARE_PROVIDER_SITE_OTHER): Payer: Medicare Other | Admitting: Internal Medicine

## 2015-09-29 VITALS — BP 149/71 | HR 56 | Temp 97.9°F | Ht 67.0 in | Wt 205.4 lb

## 2015-09-29 DIAGNOSIS — K219 Gastro-esophageal reflux disease without esophagitis: Secondary | ICD-10-CM

## 2015-09-29 DIAGNOSIS — K52832 Lymphocytic colitis: Secondary | ICD-10-CM

## 2015-09-29 NOTE — Progress Notes (Signed)
Primary Care Physician:  [name], MD Primary Gastroenterologist:  Dr. Gala Romney  Pre-Procedure History & Physical: HPI:  [name] is a 75 y.o. female here for follow-up of lymphocytic colitis and GERD. Diarrhea has resolved -  has been gone now for 3 or so months. Finished Entocort about a month ago. 1-2 formed bowel movements daily. No bleeding. GERD symptoms well controlled on Protonix 40 mg daily.  Patient has stopped smoking with the help of Chantix.!  She was commended on this accomplishment.  Past Medical History:  Diagnosis Date  . Abdominal aortic aneurysm ([place]) 2/12   infrarenal diagnosed with maximum measurement at 3.3 cm  . Depression   . Diverticulosis   . GERD (gastroesophageal reflux disease)   . History of tobacco abuse    Recurrent cigarette smoking  . Hyperlipidemia   . Hyperplastic colon polyp   . Hypertension   . Hypothyroidism   . Ischemic heart disease    had redo bypass surgery in 2001, at that time she had a free right internal mammary to the LAD, LAD endarterectomy, sequential vein graft to the OM1 and OM2 -- last catheterization was in 2008  . Leg fracture    remote right leg fracture  . Microscopic colitis   . Obesity    with gastric bypass surgery in 2004  . PAD (peripheral artery disease) ([place]) 04/13/2011   right iliac occlusion  . PONV (postoperative nausea and vomiting)     Past Surgical History:  Procedure Laterality Date  . CARDIAC CATHETERIZATION  02/16/1999   Left heart catheterization with selective coronary  angiography, left ventricular angiography (RAO and LAO views), angiography of left internal mammary artery, and angiography of saphenous vein grafts  . CARDIAC CATHETERIZATION  10/20/2006   Est. EF of 55% -- Totally occuladed native coronary circulation -- Persistent patency of the right mammary graft and the distal LAD artery with peristent patency of the left mammary graft to the proximal LAD artery with persistant patency  of the saphenous vein graft to the sequentail branches of the OM        . CARPAL TUNNEL RELEASE    . CHOLECYSTECTOMY    . COLONOSCOPY  07/12/2007   Dr.Brodie- normal cecum and rectum, diverticulosis in the sigmoid colon  . COLONOSCOPY  1991   per Dr.Brodie office visit note= hyperplastic polyp  . COLONOSCOPY N/A 09/30/2014   Dr.Shaneen Reeser- noraml appearing rectal mucosa, scattered L sided diverticula, colonic mucosa has a somewhat pale friable appearence diffusely, no ulcers or erosions seen, the distal 10cm of terminal ileum appeared entirely normal. bx=benign colorectal mucosa with lymphocytic colitis  . CORONARY ARTERY BYPASS GRAFT  1985  . CORONARY ARTERY BYPASS GRAFT  03/09/1999   Redo CABG x3 --  with the free right internal mammary artey to the LAD with an LAD endarterectomy -- Sequential saphenous vein graft to OM1 and OM2                   . ESOPHAGOGASTRODUODENOSCOPY N/A 09/30/2014   Dr.Numair Masden-mild erosive reflux esophagitis s/p bariatric surgery  . GASTRIC BYPASS  2004    Prior to Admission medications   Medication Sig Start Date End Date Taking? Authorizing Provider  aspirin 325 MG tablet Take 81 mg by mouth daily.    Yes Historical Provider, MD  diphenoxylate-atropine (LOMOTIL) 2.5-0.025 MG per tablet Take 2 tablets by mouth 3 (three) times daily. 07/16/14  Yes Lafayette Dragon, MD  FLUoxetine (PROZAC) 40 MG capsule TAKE ONE  CAPSULE BY MOUTH ONE TIME DAILY 03/25/15  Yes Burtis Junes, NP  furosemide (LASIX) 20 MG tablet Take 1 tablet (20 mg total) by mouth daily as needed for edema. 05/28/15  Yes Peter M Martinique, MD  levothyroxine (SYNTHROID, LEVOTHROID) 50 MCG tablet Take 1 tablet (50 mcg total) by mouth daily before breakfast. TAKE AN EXTRA 1/2 TABLET ONE TIME EACH WEEK. NEED OV. 09/21/15  Yes Peter M Martinique, MD  loperamide (IMODIUM) 2 MG capsule Take by mouth as needed for diarrhea or loose stools. Reported on 05/01/2015   Yes Historical Provider, MD  Nebivolol HCl (BYSTOLIC) 20 MG TABS Take 1  tablet (20 mg total) by mouth daily. 01/16/15  Yes Peter M Martinique, MD  nitroGLYCERIN (NITROSTAT) 0.4 MG SL tablet Place 1 tablet (0.4 mg total) under the tongue every 5 (five) minutes as needed. 03/11/11  Yes Peter M Martinique, MD  pantoprazole (PROTONIX) 40 MG tablet Take 1 tablet (40 mg total) by mouth daily. 11/04/14  Yes Daneil Dolin, MD  simvastatin (ZOCOR) 40 MG tablet Take 1 tablet (40 mg total) by mouth at bedtime. 08/03/15  Yes Peter M Martinique, MD  budesonide (ENTOCORT EC) 3 MG 24 hr capsule Take 1 capsule (3 mg total) by mouth daily. Patient not taking: Reported on 09/29/2015 07/06/15   Carlis Stable, NP    Allergies as of 09/29/2015 - Review Complete 09/29/2015  Allergen Reaction Noted  . Dipyridamole Hives 01/16/2015  . Varenicline Hives 01/16/2015  . Chantix [varenicline tartrate]  03/08/2011  . Dipyridamole  08/10/2010    Family History  Problem Relation Age of Onset  . Stroke Father   . Diabetes Mother   . Pancreatic cancer Mother     Social History   Social History  . Marital status: Married    Spouse name: N/A  . Number of children: N/A  . Years of education: N/A   Occupational History  . Not on file.   Social History Main Topics  . Smoking status: Current Some Day Smoker    Packs/day: 0.50    Types: Cigarettes    Last attempt to quit: 01/10/1985  . Smokeless tobacco: Not on file  . Alcohol use No  . Drug use: No  . Sexual activity: No   Other Topics Concern  . Not on file   Social History Narrative  . No narrative on file    Review of Systems: See HPI, otherwise negative ROS  Physical Exam: BP (!) 149/71   Pulse (!) 56   Temp 97.9 F (36.6 C) (Oral)   Ht 5\' 7"  (1.702 m)   Wt 205 lb 6.4 oz (93.2 kg)   BMI 32.17 kg/m  General:   Alert,  Well-developed, well-nourished, pleasant and cooperative in NAD  Impression:  Pleasant 75 year old lady with lymphocytic colitis now in remission off of steroid therapy.  Recurrence of colitis possible months or  years after treatment initial remission.  GERD symptoms continue to be well controlled on Protonix 40 mg daily  Apparent successful effort at smoking cessation.   Recommendations:Continue Protonix 40 mg   GERD information provided  Office visit in 18 months  Call if recurrent diarrhea     Notice: This dictation was prepared with Dragon dictation along with smaller phrase technology. Any transcriptional errors that result from this process are unintentional and may not be corrected upon review.

## 2015-09-29 NOTE — Patient Instructions (Signed)
Continue Protonix 40 mg   GERD information  Office visit in 18 months  Call if recurrent diarrhea

## 2015-10-16 ENCOUNTER — Other Ambulatory Visit: Payer: Self-pay | Admitting: Internal Medicine

## 2015-10-17 ENCOUNTER — Other Ambulatory Visit: Payer: Self-pay | Admitting: Nurse Practitioner

## 2015-12-09 ENCOUNTER — Telehealth: Payer: Self-pay | Admitting: Cardiology

## 2015-12-09 NOTE — Telephone Encounter (Signed)
New Message  [place] voiced from [facility] there was a document submitted last week and no one has faxed any information back to the [facility].  Tandy Gaw [phone] fax number to fax form back.   Please f/u

## 2015-12-09 NOTE — Telephone Encounter (Signed)
Returned call to [place] with [place].I received information you faxed to [place].Advised Dr.Jordan is out of office this week.Advised I will fax back letter next time he is back in office.

## 2015-12-15 ENCOUNTER — Telehealth: Payer: Self-pay | Admitting: Cardiovascular Disease

## 2015-12-15 NOTE — Telephone Encounter (Signed)
[facility] because the paper was not completed , sign and dated and is asking that the doctor completes and sign and dates it . Thanks

## 2015-12-18 ENCOUNTER — Other Ambulatory Visit: Payer: Self-pay | Admitting: Cardiology

## 2015-12-21 NOTE — Telephone Encounter (Signed)
Form from [facility] signed by Dr.Jordan.He advised need to fax to PCP for obesity diagnosis.Form faxed back to fax # [phone].

## 2015-12-25 ENCOUNTER — Other Ambulatory Visit: Payer: Self-pay | Admitting: Cardiology

## 2016-01-24 ENCOUNTER — Other Ambulatory Visit: Payer: Self-pay | Admitting: Cardiology

## 2016-01-25 NOTE — Telephone Encounter (Signed)
Rx(s) sent to pharmacy electronically.  

## 2016-02-02 ENCOUNTER — Other Ambulatory Visit: Payer: Self-pay | Admitting: Cardiology

## 2016-02-02 NOTE — Telephone Encounter (Signed)
REFILL 

## 2016-02-24 ENCOUNTER — Telehealth: Payer: Self-pay | Admitting: Cardiology

## 2016-02-24 NOTE — Telephone Encounter (Signed)
Returned call to patient she stated our number showed up on her phone, but no one left a message.Advised she is due to see Dr.Jordan.Appointment scheduled 03/22/16 at 8:45 am.

## 2016-02-24 NOTE — Telephone Encounter (Signed)
She is returning a call,she says she does not know who called.

## 2016-03-04 ENCOUNTER — Other Ambulatory Visit: Payer: Self-pay | Admitting: Cardiology

## 2016-03-21 NOTE — Progress Notes (Signed)
[name] Date of Birth: 05/22/1940   History of Present Illness: [name] is seen today for follow up CAD and PAD. She has known CAD with redo CABG in 2001.  She has a known abdominal aortic aneurysm with last ultrasound  showing a  3.2 cm aneurysm in July 2017. She has a history of PAD with right iliac occlusion. Last ABIs in July 2017 showed stable right ABI of 0.66.  She has chronic claudication symptoms with pain in right thigh with walking. This has not changed.[name] Her last nuclear stress test in January 2016 showed  anterior wall ischemia. This was unchanged from 2012 and 2013. She had a cardiac cath in 2012 which  showed patent grafts.  On followup today she denies chest pain or SOB. Her BP has been under good control since she was switched to Bystolic.  She has  lymphocytic colitis. Followed by Dr. Gala Romney. This is well controlled now.  On follow up today she reports she is doing well. She had a bought of bronchitis and quit smoking in early February. States breathing is better now. No chest pain. Stable right leg claudication. No dizziness or palpitations.    Current Outpatient Prescriptions on File Prior to Visit  Medication Sig Dispense Refill  . aspirin 325 MG tablet Take 81 mg by mouth daily.     [name] BYSTOLIC 20 MG TABS TAKE 1 TABLET (20 MG TOTAL) BY MOUTH DAILY. NEED OV. 30 tablet 0  . FLUoxetine (PROZAC) 40 MG capsule TAKE ONE CAPSULE BY MOUTH EVERY DAY 90 capsule 1  . furosemide (LASIX) 20 MG tablet Take 1 tablet (20 mg total) by mouth daily as needed for edema. 30 tablet 1  . levothyroxine (SYNTHROID, LEVOTHROID) 50 MCG tablet TAKE 1 TABLET ONCE DAILY BEFORE BREAKFAST. TAKE AN EXTRA 1/2 TABLET ONE TIME EACH WEEK 96 tablet 6  . loperamide (IMODIUM) 2 MG capsule Take by mouth as needed for diarrhea or loose stools. Reported on 05/01/2015    . nitroGLYCERIN (NITROSTAT) 0.4 MG SL tablet Place 1 tablet (0.4 mg total) under the tongue every 5 (five) minutes as needed. 25 tablet 11  .  pantoprazole (PROTONIX) 40 MG tablet TAKE 1 TABLET BY MOUTH EVERY DAY 30 tablet 11  . simvastatin (ZOCOR) 40 MG tablet Take 1 tablet (40 mg total) by mouth at bedtime. PLEASE CONTACT OFFICE FOR ADDITIONAL REFILLS 30 tablet 0   No current facility-administered medications on file prior to visit.     Allergies  Allergen Reactions  . Dipyridamole Hives  . Varenicline Hives  . Chantix [Varenicline Tartrate]   . Dipyridamole     Past Medical History:  Diagnosis Date  . Abdominal aortic aneurysm ([place]) 2/12   infrarenal diagnosed with maximum measurement at 3.3 cm  . Depression   . Diverticulosis   . GERD (gastroesophageal reflux disease)   . History of tobacco abuse    Recurrent cigarette smoking  . Hyperlipidemia   . Hyperplastic colon polyp   . Hypertension   . Hypothyroidism   . Ischemic heart disease    had redo bypass surgery in 2001, at that time she had a free right internal mammary to the LAD, LAD endarterectomy, sequential vein graft to the OM1 and OM2 -- last catheterization was in 2008  . Leg fracture    remote right leg fracture  . Microscopic colitis   . Obesity    with gastric bypass surgery in 2004  . PAD (peripheral artery disease) ([place]) 04/13/2011  right iliac occlusion  . PONV (postoperative nausea and vomiting)     Past Surgical History:  Procedure Laterality Date  . CARDIAC CATHETERIZATION  02/16/1999   Left heart catheterization with selective coronary  angiography, left ventricular angiography (RAO and LAO views), angiography of left internal mammary artery, and angiography of saphenous vein grafts  . CARDIAC CATHETERIZATION  10/20/2006   Est. EF of 55% -- Totally occuladed native coronary circulation -- Persistent patency of the right mammary graft and the distal LAD artery with peristent patency of the left mammary graft to the proximal LAD artery with persistant patency of the saphenous vein graft to the sequentail branches of the OM        . CARPAL  TUNNEL RELEASE    . CHOLECYSTECTOMY    . COLONOSCOPY  07/12/2007   Dr.Brodie- normal cecum and rectum, diverticulosis in the sigmoid colon  . COLONOSCOPY  1991   per Dr.Brodie office visit note= hyperplastic polyp  . COLONOSCOPY N/A 09/30/2014   Dr.Rourk- noraml appearing rectal mucosa, scattered L sided diverticula, colonic mucosa has a somewhat pale friable appearence diffusely, no ulcers or erosions seen, the distal 10cm of terminal ileum appeared entirely normal. bx=benign colorectal mucosa with lymphocytic colitis  . CORONARY ARTERY BYPASS GRAFT  1985  . CORONARY ARTERY BYPASS GRAFT  03/09/1999   Redo CABG x3 --  with the free right internal mammary artey to the LAD with an LAD endarterectomy -- Sequential saphenous vein graft to OM1 and OM2                   . ESOPHAGOGASTRODUODENOSCOPY N/A 09/30/2014   Dr.Rourk-mild erosive reflux esophagitis s/p bariatric surgery  . GASTRIC BYPASS  2004    History  Smoking Status  . Former Smoker  . Packs/day: 0.50  . Types: Cigarettes  . Start date: 01/10/1985  . Quit date: 02/11/2016  Smokeless Tobacco  . Never Used    History  Alcohol Use No    Family History  Problem Relation Age of Onset  . Stroke Father   . Diabetes Mother   . Pancreatic cancer Mother     Review of Systems: The review of systems is as above.  All other systems were reviewed and are negative.  Physical Exam: BP (!) 144/72   Pulse (!) 56   Ht 5\' 7"  (1.702 m)   Wt 214 lb (97.1 kg)   BMI 33.52 kg/m  Patient is an obese WF in no acute distress.  HEENT is unremarkable. Normocephalic/atraumatic. PERRL. Sclera are nonicteric. Neck is supple. No masses. Bilateral bruits. No JVD. Lungs are clear. Cardiac exam shows a regular rate and rhythm. She has a 2/6 harsh outflow murmur radiating to the carotids. Abdomen is obese but soft. Extremities are without edema. Pedal pulses are nonpalpable. Feet are warm and dry. Gait and ROM are intact. No gross neurologic deficits  noted.   LABORATORY DATA: Lab Results  Component Value Date   CHOL 149 02/04/2014   HDL 60 02/04/2014   LDLCALC 64 02/04/2014   TRIG 124 02/04/2014   CHOLHDL 2.5 02/04/2014   Lab Results  Component Value Date   WBC 9.4 09/26/2014   HGB 15.2 (H) 09/26/2014   HCT 45.1 09/26/2014   PLT 213 09/26/2014   GLUCOSE 59 (L) 09/26/2014   CHOL 149 02/04/2014   TRIG 124 02/04/2014   HDL 60 02/04/2014   LDLCALC 64 02/04/2014   ALT 9 09/26/2014   AST 18 09/26/2014   NA 142 09/26/2014  K 3.3 (L) 09/26/2014   CL 104 09/26/2014   CREATININE 0.62 09/26/2014   BUN 7 09/26/2014   CO2 24 09/26/2014   TSH 2.52 07/04/2014   INR 1.0 10/16/2006   Labs dated 10/05/15: glucose 108. Otherwise CMET is normal. Cholesterol 130, triglycerides 114, HDL 63, LDL 44. TSH normal.   Carotid dopplers 2/17 show bilateral 40-59% disease.    Assessment / Plan: 1. Coronary disease status post CABG. Redo coronary bypass surgery in 2001 with sequential saphenous vein graft to the first and second obtuse marginal vessels and a free RIMA graft to the LAD. Prior LIMA graft to the proximal LAD. Myoview study January 2016 was stable showing anterior ischemia similar to prior studies.  Cardiac cath in 2008 showed patent grafts. Asymptomatic. Continue aspirin, beta blocker, and statin therapy.   2. PAD with right iliac occlusion. Stable claudication. Dopplers in July 2017 showed stable ABI of 0.66 on the right and 0.97 on the left.   3. Abdominal aortic aneurysm measuring 3.2 cm. Stable July 2017.  4. Hypertension, controlled today.   5. Tobacco abuse-congratulated on smoking cessation.  6. Obesity.  7. Lymphocytic colitis. Per Dr. Gala Romney.  8. Aortic stenosis- mild per Echo in 2012. Will arrange follow up Echo.  9. Carotid arterial disease- moderate. Repeat doppler now and yearly.  I will follow up in 6 months.

## 2016-03-22 ENCOUNTER — Encounter: Payer: Self-pay | Admitting: Cardiology

## 2016-03-22 ENCOUNTER — Ambulatory Visit (INDEPENDENT_AMBULATORY_CARE_PROVIDER_SITE_OTHER): Payer: Medicare Other | Admitting: Cardiology

## 2016-03-22 VITALS — BP 144/72 | HR 56 | Ht 67.0 in | Wt 214.0 lb

## 2016-03-22 DIAGNOSIS — I739 Peripheral vascular disease, unspecified: Secondary | ICD-10-CM | POA: Diagnosis not present

## 2016-03-22 DIAGNOSIS — I779 Disorder of arteries and arterioles, unspecified: Secondary | ICD-10-CM | POA: Diagnosis not present

## 2016-03-22 DIAGNOSIS — I714 Abdominal aortic aneurysm, without rupture, unspecified: Secondary | ICD-10-CM

## 2016-03-22 DIAGNOSIS — I2510 Atherosclerotic heart disease of native coronary artery without angina pectoris: Secondary | ICD-10-CM

## 2016-03-22 DIAGNOSIS — I350 Nonrheumatic aortic (valve) stenosis: Secondary | ICD-10-CM

## 2016-03-22 DIAGNOSIS — I10 Essential (primary) hypertension: Secondary | ICD-10-CM

## 2016-03-22 NOTE — Patient Instructions (Addendum)
Continue your current therapy  We will schedule you for carotid dopplers and an Echocardiogram  I will see you in 6 months  Congratulations on quitting smoking !!!

## 2016-04-01 ENCOUNTER — Other Ambulatory Visit: Payer: Self-pay | Admitting: Cardiology

## 2016-04-02 ENCOUNTER — Other Ambulatory Visit: Payer: Self-pay | Admitting: Cardiology

## 2016-04-04 NOTE — Telephone Encounter (Signed)
Rx(s) sent to pharmacy electronically.  

## 2016-04-11 ENCOUNTER — Other Ambulatory Visit: Payer: Self-pay | Admitting: Nurse Practitioner

## 2016-04-11 NOTE — Telephone Encounter (Signed)
She should get this filled by Dr. [name]  Peter Martinique MD, [facility]

## 2016-04-13 ENCOUNTER — Other Ambulatory Visit: Payer: Self-pay | Admitting: Cardiology

## 2016-04-19 ENCOUNTER — Other Ambulatory Visit: Payer: Self-pay

## 2016-04-19 ENCOUNTER — Ambulatory Visit (HOSPITAL_BASED_OUTPATIENT_CLINIC_OR_DEPARTMENT_OTHER): Payer: Medicare Other

## 2016-04-19 ENCOUNTER — Ambulatory Visit (HOSPITAL_COMMUNITY)
Admission: RE | Admit: 2016-04-19 | Discharge: 2016-04-19 | Disposition: A | Discharge status: Home / Self Care | Payer: Medicare Other | Source: Ambulatory Visit | Attending: Cardiovascular Disease | Admitting: Cardiovascular Disease

## 2016-04-19 DIAGNOSIS — I350 Nonrheumatic aortic (valve) stenosis: Secondary | ICD-10-CM | POA: Diagnosis not present

## 2016-04-19 DIAGNOSIS — I714 Abdominal aortic aneurysm, without rupture: Secondary | ICD-10-CM | POA: Diagnosis not present

## 2016-04-19 DIAGNOSIS — I2510 Atherosclerotic heart disease of native coronary artery without angina pectoris: Secondary | ICD-10-CM | POA: Insufficient documentation

## 2016-04-19 DIAGNOSIS — E669 Obesity, unspecified: Secondary | ICD-10-CM | POA: Diagnosis not present

## 2016-04-19 DIAGNOSIS — I10 Essential (primary) hypertension: Secondary | ICD-10-CM | POA: Insufficient documentation

## 2016-04-19 DIAGNOSIS — Z6833 Body mass index (BMI) 33.0-33.9, adult: Secondary | ICD-10-CM | POA: Insufficient documentation

## 2016-04-19 DIAGNOSIS — I255 Ischemic cardiomyopathy: Secondary | ICD-10-CM | POA: Diagnosis not present

## 2016-04-19 DIAGNOSIS — I739 Peripheral vascular disease, unspecified: Secondary | ICD-10-CM | POA: Insufficient documentation

## 2016-04-19 DIAGNOSIS — Z87891 Personal history of nicotine dependence: Secondary | ICD-10-CM | POA: Insufficient documentation

## 2016-04-19 DIAGNOSIS — I071 Rheumatic tricuspid insufficiency: Secondary | ICD-10-CM | POA: Insufficient documentation

## 2016-04-19 DIAGNOSIS — I779 Disorder of arteries and arterioles, unspecified: Secondary | ICD-10-CM | POA: Insufficient documentation

## 2016-04-19 DIAGNOSIS — E785 Hyperlipidemia, unspecified: Secondary | ICD-10-CM | POA: Insufficient documentation

## 2016-04-19 DIAGNOSIS — Z951 Presence of aortocoronary bypass graft: Secondary | ICD-10-CM | POA: Insufficient documentation

## 2016-04-19 DIAGNOSIS — I6523 Occlusion and stenosis of bilateral carotid arteries: Secondary | ICD-10-CM | POA: Insufficient documentation

## 2016-04-19 DIAGNOSIS — I340 Nonrheumatic mitral (valve) insufficiency: Secondary | ICD-10-CM | POA: Diagnosis not present

## 2016-04-26 ENCOUNTER — Other Ambulatory Visit: Payer: Self-pay

## 2016-04-26 DIAGNOSIS — I350 Nonrheumatic aortic (valve) stenosis: Secondary | ICD-10-CM

## 2016-05-03 ENCOUNTER — Telehealth: Payer: Self-pay

## 2016-05-03 ENCOUNTER — Encounter: Payer: Self-pay | Admitting: Internal Medicine

## 2016-05-03 MED ORDER — BUDESONIDE 3 MG PO CPEP: 6.0000 mg | ORAL_CAPSULE | Freq: Every day | ORAL | 1 refills | Status: DC

## 2016-05-03 NOTE — Telephone Encounter (Signed)
APPT MADE AND LETTER SENT  °

## 2016-05-03 NOTE — Telephone Encounter (Signed)
If no antibiotic or sick exposures lately, will start Entocort 6 mg daily (2) 3 mg tablets daily 6 weeks. Office visit in 6 weeks.  Dispense 90-3 milligram tablets with one refill.

## 2016-05-03 NOTE — Telephone Encounter (Signed)
Pt called office. She is having some loose stool with mucous. Some abdominal cramping in lower abdomen before having a bm. No fever. No n/v. Symptoms started last week. She is wanting some steroids. Routing message to Dr. Gala Romney.

## 2016-05-03 NOTE — Telephone Encounter (Signed)
Pt said she has not been around anyone sick or taken any recent abx. rx sent to the pharmacy.   Please schedule ov in 6 weeks.

## 2016-05-05 ENCOUNTER — Other Ambulatory Visit: Payer: Self-pay | Admitting: *Deleted

## 2016-05-05 MED ORDER — SIMVASTATIN 40 MG PO TABS: 40.0000 mg | ORAL_TABLET | Freq: Every day | ORAL | 2 refills | Status: DC

## 2016-05-05 NOTE — Telephone Encounter (Signed)
Rx has been sent to the pharmacy electronically. ° °

## 2016-05-18 ENCOUNTER — Other Ambulatory Visit: Payer: Self-pay | Admitting: Nurse Practitioner

## 2016-06-07 ENCOUNTER — Ambulatory Visit (INDEPENDENT_AMBULATORY_CARE_PROVIDER_SITE_OTHER): Payer: Medicare Other | Admitting: Internal Medicine

## 2016-06-07 ENCOUNTER — Encounter: Payer: Self-pay | Admitting: Internal Medicine

## 2016-06-07 VITALS — BP 152/77 | HR 65 | Temp 97.1°F | Ht 68.0 in | Wt 217.2 lb

## 2016-06-07 DIAGNOSIS — R197 Diarrhea, unspecified: Secondary | ICD-10-CM

## 2016-06-07 DIAGNOSIS — K219 Gastro-esophageal reflux disease without esophagitis: Secondary | ICD-10-CM

## 2016-06-07 MED ORDER — HYOSCYAMINE SULFATE SL 0.125 MG SL SUBL: 0.1250 mg | SUBLINGUAL_TABLET | Freq: Three times a day (TID) | SUBLINGUAL | 3 refills | Status: DC

## 2016-06-07 NOTE — Progress Notes (Signed)
Primary Care Physician:  [name], MD Primary Gastroenterologist:  Dr. Gala Romney  Pre-Procedure History & Physical: HPI:  [name] is a 76 y.o. female here for evaluation of a 2 week episode of nonbloody, watery diarrhea, abdominal cramps she had last half of April.  Subsequently,  Over the past month, symptoms improved dramatically history of lymphocytic colitis. She called in April 24th ; denied an antibiotic history at that time. We started Entocort empirically 6 mg daily she's been on that dose for about 6 weeks this time. No diarrhea 1-3 formed bowel movements daily some abdominal cramps. Today she reports that she forgot to tell as she received antibiotic shot and a weeks worth of oral antibiotics for sinus infection just before the onset of her diarrhea.  She continues to have kicked the habit as far as cigarette smoking is concerned. GERD symptoms well controlled on Protonix 40 mg daily.  Past Medical History:  Diagnosis Date  . Abdominal aortic aneurysm ([place]) 2/12   infrarenal diagnosed with maximum measurement at 3.3 cm  . Depression   . Diverticulosis   . GERD (gastroesophageal reflux disease)   . History of tobacco abuse    Recurrent cigarette smoking  . Hyperlipidemia   . Hyperplastic colon polyp   . Hypertension   . Hypothyroidism   . Ischemic heart disease    had redo bypass surgery in 2001, at that time she had a free right internal mammary to the LAD, LAD endarterectomy, sequential vein graft to the OM1 and OM2 -- last catheterization was in 2008  . Leg fracture    remote right leg fracture  . Microscopic colitis   . Obesity    with gastric bypass surgery in 2004  . PAD (peripheral artery disease) ([place]) 04/13/2011   right iliac occlusion  . PONV (postoperative nausea and vomiting)     Past Surgical History:  Procedure Laterality Date  . CARDIAC CATHETERIZATION  02/16/1999   Left heart catheterization with selective coronary  angiography, left  ventricular angiography (RAO and LAO views), angiography of left internal mammary artery, and angiography of saphenous vein grafts  . CARDIAC CATHETERIZATION  10/20/2006   Est. EF of 55% -- Totally occuladed native coronary circulation -- Persistent patency of the right mammary graft and the distal LAD artery with peristent patency of the left mammary graft to the proximal LAD artery with persistant patency of the saphenous vein graft to the sequentail branches of the OM        . CARPAL TUNNEL RELEASE    . CHOLECYSTECTOMY    . COLONOSCOPY  07/12/2007   Dr.Brodie- normal cecum and rectum, diverticulosis in the sigmoid colon  . COLONOSCOPY  1991   per Dr.Brodie office visit note= hyperplastic polyp  . COLONOSCOPY N/A 09/30/2014   Dr.Elic Vencill- noraml appearing rectal mucosa, scattered L sided diverticula, colonic mucosa has a somewhat pale friable appearence diffusely, no ulcers or erosions seen, the distal 10cm of terminal ileum appeared entirely normal. bx=benign colorectal mucosa with lymphocytic colitis  . CORONARY ARTERY BYPASS GRAFT  1985  . CORONARY ARTERY BYPASS GRAFT  03/09/1999   Redo CABG x3 --  with the free right internal mammary artey to the LAD with an LAD endarterectomy -- Sequential saphenous vein graft to OM1 and OM2                   . ESOPHAGOGASTRODUODENOSCOPY N/A 09/30/2014   Dr.Olivette Beckmann-mild erosive reflux esophagitis s/p bariatric surgery  . GASTRIC BYPASS  2004    Prior to Admission medications   Medication Sig Start Date End Date Taking? Authorizing Provider  aspirin 325 MG tablet Take 81 mg by mouth daily.    Yes [provider]  budesonide (ENTOCORT EC) 3 MG 24 hr capsule Take 2 capsules (6 mg total) by mouth daily. 05/03/16  Yes Barkley Kratochvil, Cristopher Estimable, MD  FLUoxetine (PROZAC) 40 MG capsule TAKE ONE CAPSULE BY MOUTH EVERY DAY 10/19/15  Yes Burtis Junes, NP  furosemide (LASIX) 20 MG tablet Take 1 tablet (20 mg total) by mouth daily as needed for edema. 05/28/15  Yes Martinique,  Peter M, MD  levothyroxine (SYNTHROID, LEVOTHROID) 50 MCG tablet TAKE 1 TABLET ONCE DAILY BEFORE BREAKFAST. TAKE AN EXTRA 1/2 TABLET ONE TIME EACH WEEK 12/18/15  Yes Martinique, Peter M, MD  loperamide (IMODIUM) 2 MG capsule Take by mouth as needed for diarrhea or loose stools. Reported on 05/01/2015   Yes [provider]  Melatonin 1 MG CAPS Take 1 tablet by mouth at bedtime.   Yes [provider]  Nebivolol HCl (BYSTOLIC) 20 MG TABS Take 1 tablet (20 mg total) by mouth daily. 04/04/16  Yes Martinique, Peter M, MD  nitroGLYCERIN (NITROSTAT) 0.4 MG SL tablet Place 1 tablet (0.4 mg total) under the tongue every 5 (five) minutes as needed. 03/11/11  Yes Martinique, Peter M, MD  pantoprazole (PROTONIX) 40 MG tablet TAKE 1 TABLET BY MOUTH EVERY DAY 10/19/15  Yes Mahala Menghini, PA-C  simvastatin (ZOCOR) 40 MG tablet Take 1 tablet (40 mg total) by mouth at bedtime. 05/05/16  Yes Martinique, Peter M, MD    Allergies as of 06/07/2016 - Review Complete 06/07/2016  Allergen Reaction Noted  . Dipyridamole Hives 01/16/2015  . Varenicline Hives 01/16/2015  . Chantix [varenicline tartrate]  03/08/2011  . Dipyridamole  08/10/2010    Family History  Problem Relation Age of Onset  . Stroke Father   . Diabetes Mother   . Pancreatic cancer Mother     Social History   Social History  . Marital status: Married    Spouse name: N/A  . Number of children: N/A  . Years of education: N/A   Occupational History  . Not on file.   Social History Main Topics  . Smoking status: Former Smoker    Packs/day: 0.50    Types: Cigarettes    Start date: 01/10/1985    Quit date: 02/11/2016  . Smokeless tobacco: Never Used  . Alcohol use No  . Drug use: No  . Sexual activity: No   Other Topics Concern  . Not on file   Social History Narrative  . No narrative on file    Review of Systems: See HPI, otherwise negative ROS  Physical Exam: BP (!) 152/77   Pulse 65   Temp 97.1 F (36.2 C) (Oral)   Ht 5\' 8"   (1.727 m)   Wt 217 lb 3.2 oz (98.5 kg)   BMI 33.03 kg/m  General:   Alert,  Well-developed, well-nourished, pleasant and cooperative in NAD Neck:  Supple; no masses or thyromegaly. No significant cervical adenopathy. Lungs:  Clear throughout to auscultation.   No wheezes, crackles, or rhonchi. No acute distress. Heart:  Regular rate and rhythm; no murmurs, clicks, rubs,  or gallops. Abdomen: Non-distended, normal bowel sounds.  Soft and nontender without appreciable mass or hepatosplenomegaly.  Pulses:  Normal pulses noted. Extremities:  Without clubbing or edema.  Impression:  Pleasant 76 year old lady with a well-documented history of microscopic  colitis with recurrent diarrhea now significantly improved after starting Entocort once again. We did not have the history of antibiotics exposure when she called in last month. This may have changed management. At any rate, she is doing better now on 6 mg Entocort daily.  GERD symptoms continue to well-controlled on Protonix  Recommendations: Continue Entocort 6 mg daily x 2 more weeks; then decrease to 3 mg daily x 6 weeks  Resume Align one capsule daily  Use Levsin SL one tablet under the tongue before meals and at bedtime as needed for abdominal cramps.(disp 30 with 3 refills)  Ov with [place] in 8 weeks       Notice: This dictation was prepared with Dragon dictation along with smaller phrase technology. Any transcriptional errors that result from this process are unintentional and may not be corrected upon review.

## 2016-06-07 NOTE — Patient Instructions (Signed)
Continue Entocort 6 mg daily x 2 more weeks; then decrease to 3 mg daily x 6 weeks  Resume Align one capsule daily  Use Levsin SL one tablet under the tongue before meals and at bedtime as needed for abdominal cramps.(disp 30 with 3 refills)  Ov with [place] in 8 weeks

## 2016-06-13 ENCOUNTER — Telehealth: Payer: Self-pay

## 2016-06-13 NOTE — Telephone Encounter (Signed)
In the interim, can we find out what is covered on their formulary?

## 2016-06-13 NOTE — Telephone Encounter (Signed)
Received a request for a prior authorization for hyoscyamine. PA done and sent to the insurance company.  Insurance company denied claim. They said it was was not covered at all and was excluded from the medicare formulary.   Dr.Rourk, do you want to prescribed something else?   [name], will you review this? Can it wait until RMR returns? )

## 2016-06-14 NOTE — Telephone Encounter (Signed)
Communication noted. Await info from formulary

## 2016-06-16 MED ORDER — CILIDINIUM-CHLORDIAZEPOXIDE 2.5-5 MG PO CAPS: 1.0000 | ORAL_CAPSULE | Freq: Three times a day (TID) | ORAL | 1 refills | Status: DC

## 2016-06-16 NOTE — Telephone Encounter (Signed)
The only medications I can find on the formulary are dicyclomine and librium.

## 2016-06-16 NOTE — Telephone Encounter (Signed)
Ok; can try Librax - one capsule AC and QHS - only PRN abd cramps and diarrhea (#40) with 1 refill

## 2016-06-16 NOTE — Telephone Encounter (Signed)
rx sent in to the pharmacy. Tried to call pt- LMOM with recommendations.

## 2016-07-04 ENCOUNTER — Telehealth: Payer: Self-pay | Admitting: Cardiology

## 2016-07-04 NOTE — Telephone Encounter (Signed)
New Message  Pt c/o BP issue: STAT if pt c/o blurred vision, one-sided weakness or slurred speech  1. What are your last 5 BP readings? 155/70  2. Are you having any other symptoms (ex. Dizziness, headache, blurred vision, passed out)? Dull headache  3. What is your BP issue? Pt would like to speak with Rn about high bp. Pt states its been over 155/60. Please call back to discuss

## 2016-07-04 NOTE — Telephone Encounter (Signed)
Returned call to patient.She stated her B/P has been elevated over the weekend.Ranging 173/83,159/85,160/77,150/70.Pulse 59,60.Stated she ate salty french fries last week.Advised of low salt diet.Appointment scheduled with Cecilie Kicks NP 07/07/16 at 2:30 pm at [facility] office.Advised to continue to monitor B/P and take readings to appointment.

## 2016-07-07 ENCOUNTER — Encounter: Payer: Self-pay | Admitting: Cardiology

## 2016-07-07 ENCOUNTER — Ambulatory Visit (INDEPENDENT_AMBULATORY_CARE_PROVIDER_SITE_OTHER): Payer: Medicare Other | Admitting: Cardiology

## 2016-07-07 VITALS — BP 148/76 | HR 62 | Ht 66.0 in | Wt 220.8 lb

## 2016-07-07 DIAGNOSIS — I739 Peripheral vascular disease, unspecified: Secondary | ICD-10-CM

## 2016-07-07 DIAGNOSIS — I10 Essential (primary) hypertension: Secondary | ICD-10-CM

## 2016-07-07 DIAGNOSIS — R5383 Other fatigue: Secondary | ICD-10-CM

## 2016-07-07 DIAGNOSIS — I2510 Atherosclerotic heart disease of native coronary artery without angina pectoris: Secondary | ICD-10-CM

## 2016-07-07 DIAGNOSIS — I779 Disorder of arteries and arterioles, unspecified: Secondary | ICD-10-CM | POA: Diagnosis not present

## 2016-07-07 DIAGNOSIS — I714 Abdominal aortic aneurysm, without rupture, unspecified: Secondary | ICD-10-CM

## 2016-07-07 MED ORDER — VALSARTAN 80 MG PO TABS: 80.0000 mg | ORAL_TABLET | Freq: Every day | ORAL | 3 refills | Status: DC

## 2016-07-07 MED ORDER — VALSARTAN 80 MG PO TABS: 80.0000 mg | ORAL_TABLET | Freq: Every day | ORAL | 11 refills | Status: DC

## 2016-07-07 NOTE — Patient Instructions (Signed)
Medication Instructions:  1. START DIOVAN 80 MG DAILY; RX HAS BEEN SENT IN  Labwork: 1. CMP, TSH, CBC  Testing/Procedures: YOU WILL NEED TO SCHEDULE a lower extremity arterial exercise and AAA DUPLEX; ORDERS BY DR. Martinique ALREADY IN EPIC  Follow-Up: 2-3 WEEKS WITH Cecilie Kicks, NP  Any Other Special Instructions Will Be Listed Below (If Applicable). MONITOR BLOOD PRESSURE OVER THE WEEKEND AND CALL THE OFFICE ON Monday 07/11/16 WITH BP READINGS , ASK FOR JENNIFER W. [phone]    If you need a refill on your cardiac medications before your next appointment, please call your pharmacy.

## 2016-07-07 NOTE — Progress Notes (Signed)
Cardiology Office Note   Date:  07/07/2016   ID:  [name], [name] 17-Nov-1940, MRN [id number]  PCP:  [name], MD  Cardiologist:  Dr. Martinique    Chief Complaint  Patient presents with  . Hypertension      History of Present Illness: [name] is a 76 y.o. female who presents for HTN -pt had called with on the 25 th with BP ranging 173/83 to 150/79 she did eat salty [place] fries last week.    She has a hx of CAD and PAD. She has known CAD with redo CABG in 2001.  She has a known abdominal aortic aneurysm with last ultrasound  showing a  3.2 cm aneurysm in July 2017. She has a history of PAD with right iliac occlusion. Last ABIs in July 2017 showed stable right ABI of 0.66.  She has chronic claudication symptoms with pain in right thigh with walking. This has not changed.[name] Her last nuclear stress test in January 2016 showed  anterior wall ischemia. This was unchanged from 2012 and 2013. She had a cardiac cath in 2012 which  showed patent grafts.   Echo 04/2016 with EF 60-65%, aortic stenosis moderate , G 2DD, PA pk pressure 34 mmHg.   Today she is very concerned about her BP.  It has been staying elevated and at times she is off balance with this.  Once BP down she feels better.  Also some dizziness. No chest pain and no SOB.  Most people in her family have had strokes.  Even her husband recently.  She is on bystolic but it is very expensive for her and she would like to come off of it.  We discussed controlling BP first.  Also with fatigue.   Past Medical History:  Diagnosis Date  . Abdominal aortic aneurysm ([place]) 2/12   infrarenal diagnosed with maximum measurement at 3.3 cm  . Depression   . Diverticulosis   . GERD (gastroesophageal reflux disease)   . History of tobacco abuse    Recurrent cigarette smoking  . Hyperlipidemia   . Hyperplastic colon polyp   . Hypertension   . Hypothyroidism   . Ischemic heart disease    had redo bypass surgery in 2001, at that  time she had a free right internal mammary to the LAD, LAD endarterectomy, sequential vein graft to the OM1 and OM2 -- last catheterization was in 2008  . Leg fracture    remote right leg fracture  . Microscopic colitis   . Obesity    with gastric bypass surgery in 2004  . PAD (peripheral artery disease) ([place]) 04/13/2011   right iliac occlusion  . PONV (postoperative nausea and vomiting)     Past Surgical History:  Procedure Laterality Date  . CARDIAC CATHETERIZATION  02/16/1999   Left heart catheterization with selective coronary  angiography, left ventricular angiography (RAO and LAO views), angiography of left internal mammary artery, and angiography of saphenous vein grafts  . CARDIAC CATHETERIZATION  10/20/2006   Est. EF of 55% -- Totally occuladed native coronary circulation -- Persistent patency of the right mammary graft and the distal LAD artery with peristent patency of the left mammary graft to the proximal LAD artery with persistant patency of the saphenous vein graft to the sequentail branches of the OM        . CARPAL TUNNEL RELEASE    . CHOLECYSTECTOMY    . COLONOSCOPY  07/12/2007   Dr.Brodie- normal cecum and rectum,  diverticulosis in the sigmoid colon  . COLONOSCOPY  1991   per Dr.Brodie office visit note= hyperplastic polyp  . COLONOSCOPY N/A 09/30/2014   Dr.Rourk- noraml appearing rectal mucosa, scattered L sided diverticula, colonic mucosa has a somewhat pale friable appearence diffusely, no ulcers or erosions seen, the distal 10cm of terminal ileum appeared entirely normal. bx=benign colorectal mucosa with lymphocytic colitis  . CORONARY ARTERY BYPASS GRAFT  1985  . CORONARY ARTERY BYPASS GRAFT  03/09/1999   Redo CABG x3 --  with the free right internal mammary artey to the LAD with an LAD endarterectomy -- Sequential saphenous vein graft to OM1 and OM2                   . ESOPHAGOGASTRODUODENOSCOPY N/A 09/30/2014   Dr.Rourk-mild erosive reflux esophagitis s/p bariatric  surgery  . GASTRIC BYPASS  2004     Current Outpatient Prescriptions  Medication Sig Dispense Refill  . aspirin 325 MG tablet Take 81 mg by mouth daily.     . budesonide (ENTOCORT EC) 3 MG 24 hr capsule Take 2 capsules (6 mg total) by mouth daily. 90 capsule 1  . clidinium-chlordiazePOXIDE (LIBRAX) 5-2.5 MG capsule Take 1 capsule by mouth 4 (four) times daily -  before meals and at bedtime. Prn abdominal cramping and diarrhea 40 capsule 1  . FLUoxetine (PROZAC) 40 MG capsule TAKE ONE CAPSULE BY MOUTH EVERY DAY 90 capsule 1  . furosemide (LASIX) 20 MG tablet Take 1 tablet (20 mg total) by mouth daily as needed for edema. 30 tablet 1  . Hyoscyamine Sulfate SL (LEVSIN/SL) 0.125 MG SUBL Place 0.125 mg under the tongue 4 (four) times daily -  before meals and at bedtime. prn 30 each 3  . levothyroxine (SYNTHROID, LEVOTHROID) 50 MCG tablet TAKE 1 TABLET ONCE DAILY BEFORE BREAKFAST. TAKE AN EXTRA 1/2 TABLET ONE TIME EACH WEEK 96 tablet 6  . loperamide (IMODIUM) 2 MG capsule Take by mouth as needed for diarrhea or loose stools. Reported on 05/01/2015    . Melatonin 1 MG CAPS Take 1 tablet by mouth at bedtime.    . Nebivolol HCl (BYSTOLIC) 20 MG TABS Take 1 tablet (20 mg total) by mouth daily. 30 tablet 11  . nitroGLYCERIN (NITROSTAT) 0.4 MG SL tablet Place 1 tablet (0.4 mg total) under the tongue every 5 (five) minutes as needed. 25 tablet 11  . pantoprazole (PROTONIX) 40 MG tablet TAKE 1 TABLET BY MOUTH EVERY DAY 30 tablet 11  . simvastatin (ZOCOR) 40 MG tablet Take 1 tablet (40 mg total) by mouth at bedtime. 90 tablet 2   No current facility-administered medications for this visit.     Allergies:   Dipyridamole; Varenicline; Chantix [varenicline tartrate]; and Dipyridamole    Social History:  The patient  reports that she quit smoking about 4 months ago. Her smoking use included Cigarettes. She started smoking about 31 years ago. She smoked 0.50 packs per day. She has never used smokeless  tobacco. She reports that she does not drink alcohol or use drugs.   Family History:  The patient's family history includes Diabetes in her mother; Pancreatic cancer in her mother; Stroke in her father.    ROS:  General:no colds or fevers, + weight increase she is aware of this.  Skin:no rashes or ulcers HEENT:no blurred vision, no congestion CV:see HPI PUL:see HPI GI:no diarrhea constipation or melena, no indigestion GU:no hematuria, no dysuria MS:no joint pain, no claudication Neuro:no syncope, occ lightheadedness, off balance at times  Endo:no diabetes, + thyroid disease  Wt Readings from Last 3 Encounters:  07/07/16 220 lb 12.8 oz (100.2 kg)  06/07/16 217 lb 3.2 oz (98.5 kg)  03/22/16 214 lb (97.1 kg)     PHYSICAL EXAM: VS:  BP (!) 148/76 (BP Location: Left Arm, Patient Position: Sitting, Cuff Size: Large)   Pulse 62   Ht 5\' 6"  (1.676 m)   Wt 220 lb 12.8 oz (100.2 kg)   SpO2 98%   BMI 35.64 kg/m  , BMI Body mass index is 35.64 kg/m. General:Pleasant affect, NAD Skin:Warm and dry, brisk capillary refill HEENT:normocephalic, sclera clear, mucus membranes moist Neck:supple, no JVD, + bruits  Heart:S1S2 RRR with 4-1/2 systolic murmur, no gallup, rub or click Lungs:clear without rales, rhonchi, or wheezes INO:MVEH, non tender, + BS, do not palpate liver spleen or masses Ext:no lower ext edema, 2+ pedal pulses, 2+ radial pulses Neuro:alert and oriented x 3, MAE, follows commands, + facial symmetry    EKG:  EKG is NOT ordered today.    Recent Labs: No results found for requested labs within last 8760 hours.    Lipid Panel    Component Value Date/Time   CHOL 149 02/04/2014 0831   TRIG 124 02/04/2014 0831   HDL 60 02/04/2014 0831   CHOLHDL 2.5 02/04/2014 0831   VLDL 25 02/04/2014 0831   LDLCALC 64 02/04/2014 0831       Other studies Reviewed: Additional studies/ records that were reviewed today include: .  Echo: 04/19/16 Study Conclusions  - Left  ventricle: The cavity size was normal. Wall thickness was   normal. Systolic function was normal. The estimated ejection   fraction was in the range of 60% to 65%. Wall motion was normal;   there were no regional wall motion abnormalities. Doppler   parameters are consistent with pseudonormal left ventricular   relaxation (grade 2 diastolic dysfunction). The E/e&' ratio is   >15, suggesting elevated LV filling pressure. - Aortic valve: Calcified leaflets. Moderate stenosis. Mean   gradient (S): 35 mm Hg. Peak gradient (S): 61 mm Hg. Valve area   (VTI): 0.98 cm^2. Valve area (Vmax): 1.05 cm^2. Valve area   (Vmean): 0.98 cm^2. - Mitral valve: Mildly thickened leaflets . There was mild   regurgitation. - Left atrium: The atrium was moderately dilated. - Right ventricle: The cavity size was mildly dilated. Systolic   function is mildly reduced. - Tricuspid valve: There was mild regurgitation. - Pulmonary arteries: PA peak pressure: 34 mm Hg (S). - Inferior vena cava: The vessel was normal in size. The   respirophasic diameter changes were in the normal range (>= 50%),   consistent with normal central venous pressure.  Impressions:  - Compared to a prior study in 2012, there is now moderate aortic   stenosis - AVA around 1.05 cm2 with mean gradient of 35 mmHg.   ASSESSMENT AND PLAN:  1.  Uncontrolled HTN with dizziness and fatigue.  Plan wil be to add diovan 80 to meds if BP no controlled will increase she will call Monday with BP report.  Will check labs CMP, CBC and TSH with her symptoms.  2. CAD with CABG and RE-DO no chest pain.    3.  AAA will get scheduled for July this is time to repeat.  No abd pain.    4.  Carotid bruits with < 40% stenosis bil.    5. PAD stable.  6.  Obesity discussed wt loss and exercise.    7.  Aortic stenosis mod.    Current medicines are reviewed with the patient today.  The patient Has no concerns regarding medicines.  The following changes  have been made:  See above Labs/ tests ordered today include:see above  Disposition:   FU:  see above  Signed, Cecilie Kicks, NP  07/07/2016 2:34 PM    [facility] [place], [place], [place], [place] Phone: [phone]; Fax: [phone]

## 2016-07-08 LAB — COMPREHENSIVE METABOLIC PANEL
ALBUMIN: 4 g/dL (ref 3.5–4.8)
ALK PHOS: 49 IU/L (ref 39–117)
ALT: 13 IU/L (ref 0–32)
AST: 18 IU/L (ref 0–40)
Albumin/Globulin Ratio: 1.5 (ref 1.2–2.2)
BUN / CREAT RATIO: 16 (ref 12–28)
BUN: 14 mg/dL (ref 8–27)
Bilirubin Total: 0.9 mg/dL (ref 0.0–1.2)
CO2: 24 mmol/L (ref 20–29)
CREATININE: 0.88 mg/dL (ref 0.57–1.00)
Calcium: 9.6 mg/dL (ref 8.7–10.3)
Chloride: 103 mmol/L (ref 96–106)
GFR calc Af Amer: 75 mL/min/{1.73_m2} (ref >59)
GFR calc non Af Amer: 65 mL/min/{1.73_m2} (ref >59)
GLUCOSE: 99 mg/dL (ref 65–99)
Globulin, Total: 2.7 g/dL (ref 1.5–4.5)
Potassium: 4.4 mmol/L (ref 3.5–5.2)
Sodium: 143 mmol/L (ref 134–144)
TOTAL PROTEIN: 6.7 g/dL (ref 6.0–8.5)

## 2016-07-08 LAB — CBC
HEMATOCRIT: 42.2 % (ref 34.0–46.6)
HEMOGLOBIN: 14.2 g/dL (ref 11.1–15.9)
MCH: 32.7 pg (ref 26.6–33.0)
MCHC: 33.6 g/dL (ref 31.5–35.7)
MCV: 97 fL (ref 79–97)
Platelets: 195 10*3/uL (ref 150–379)
RBC: 4.34 x10E6/uL (ref 3.77–5.28)
RDW: 13.3 % (ref 12.3–15.4)
WBC: 7.2 10*3/uL (ref 3.4–10.8)

## 2016-07-08 LAB — TSH: TSH: 3.18 u[IU]/mL (ref 0.450–4.500)

## 2016-07-11 ENCOUNTER — Telehealth: Payer: Self-pay | Admitting: Cardiology

## 2016-07-11 MED ORDER — VALSARTAN 80 MG PO TABS: 80.0000 mg | ORAL_TABLET | Freq: Two times a day (BID) | ORAL | 1 refills | Status: DC

## 2016-07-11 NOTE — Telephone Encounter (Signed)
Per pt, she takes both medications in the morning.  Please advise!

## 2016-07-11 NOTE — Telephone Encounter (Signed)
Increase diovan to 80 mg BID,  When does she take bystolic?  Morning or evening?  But needs 160 mg of diovan total daily.  If we divide maybe morning BP will be lower.  Call BP reading in again on Friday.

## 2016-07-11 NOTE — Telephone Encounter (Signed)
Pt has been advised to increase Diovan to bid, leave Bystolic the same, and to continue to monitor her bp and to call us back Friday, 07/16/16, with those.  Pt verbalized understanding.

## 2016-07-11 NOTE — Telephone Encounter (Signed)
New Message   pt verbalized that she is returning call for rn  Call after 1

## 2016-07-11 NOTE — Telephone Encounter (Signed)
Ok just take the diovan BID and the bystolic as usual.

## 2016-07-11 NOTE — Telephone Encounter (Signed)
Follow up  ° ° ° °Pt is returning [name] call  °

## 2016-07-11 NOTE — Telephone Encounter (Signed)
Returned pts call.  Her bp readings are below:  07/08/16 9:00  175/83   11:40 124/77   4:00  138/68   6:00  137/75   9:00  138/70  07/09/16 9:30  141/72 2:00   148/72   8:00  146/70  07/10/16  7:30  156/76    11:30  150/76           2:00  134/70          07/11/16 8:30  161/77      9:50  152/80       10:10  143/74      1:30  128/68

## 2016-07-15 ENCOUNTER — Telehealth: Payer: Self-pay | Admitting: Cardiology

## 2016-07-15 ENCOUNTER — Other Ambulatory Visit: Payer: Self-pay

## 2016-07-15 MED ORDER — PANTOPRAZOLE SODIUM 40 MG PO TBEC: 40.0000 mg | DELAYED_RELEASE_TABLET | Freq: Every day | ORAL | 11 refills | Status: DC

## 2016-07-15 NOTE — Telephone Encounter (Signed)
[name] is calling back with her Blood Pressure readings .  Please call

## 2016-07-15 NOTE — Telephone Encounter (Signed)
diovan

## 2016-07-15 NOTE — Telephone Encounter (Signed)
320 of what?

## 2016-07-15 NOTE — Telephone Encounter (Signed)
Patient called back with blood pressure readings. She stated that she didn't take it as much because she was getting tired of constantly taking her blood pressure. She has been advised to take it in the morning and then a couple hours after she has taken her blood pressure medication. She can then take it at night as well. She was agreeable to that suggestion.   Blood pressures:  07/13/16 9 am 162/82 10 am 158/72 11 am 140/72 4 pm 156/76  07/14/16: 9 am 166/82 10 am 140/77 11 am 123/71  07/15/16: 9 am 180/76 10 am 158/81 4 pm 130/61  Will route to the provider for their knowledge.

## 2016-07-15 NOTE — Telephone Encounter (Signed)
Ok so let's go to 320 mg daily - she has 80 mg tabs I believe so 4 tabs in AM continue bystolic.  Call us Tuesday and let us know how BP is.

## 2016-07-21 ENCOUNTER — Ambulatory Visit (INDEPENDENT_AMBULATORY_CARE_PROVIDER_SITE_OTHER): Payer: Medicare Other | Admitting: Cardiology

## 2016-07-21 ENCOUNTER — Encounter: Payer: Self-pay | Admitting: Cardiology

## 2016-07-21 VITALS — BP 134/60 | HR 62 | Ht 66.0 in | Wt 222.1 lb

## 2016-07-21 DIAGNOSIS — I10 Essential (primary) hypertension: Secondary | ICD-10-CM

## 2016-07-21 DIAGNOSIS — I2510 Atherosclerotic heart disease of native coronary artery without angina pectoris: Secondary | ICD-10-CM | POA: Diagnosis not present

## 2016-07-21 DIAGNOSIS — I714 Abdominal aortic aneurysm, without rupture, unspecified: Secondary | ICD-10-CM

## 2016-07-21 MED ORDER — VALSARTAN 320 MG PO TABS: 320.0000 mg | ORAL_TABLET | Freq: Every day | ORAL | 3 refills | Status: DC

## 2016-07-21 NOTE — Progress Notes (Signed)
Cardiology Office Note   Date:  07/21/2016   ID:  [name], [name] 01-15-1940, MRN [id number]  PCP:  [name], MD  Cardiologist:  Dr. Martinique.    Chief Complaint  Patient presents with  . Hypertension      History of Present Illness: [name] is a 76 y.o. female who presents for HTN follow up.    She has a hx of CAD and PAD. She has known CAD with redo CABG in 2001. She has a known abdominal aortic aneurysm with last ultrasound showing a 3.2 cm aneurysm in July 2017. She has a history of PAD with right iliac occlusion. Last ABIs in July 2017showed stable right ABI of 0.66. She has chronic claudication symptoms with pain in right thigh with walking. This has not changed.[name] Her last nuclear stress test in January 2016 showed anterior wall ischemia. This was unchanged from 2012 and 2013. She had a cardiac cath in 2012 which showed patent grafts.   Echo 04/2016 with EF 60-65%, aortic stenosis moderate , G 2DD, PA pk pressure 34 mmHg.   Last seen 07/07/16 for HTN and systolic BP 062 to 376 and feeling off balance.  I started diovan and we have increased over last 1-2 weeks.    We are now at 320 mg of diovan.  Today. She is feeling better.  Her BP though not yet controlled has improved.   She no longer feels off balance.  Stated she felt much better.   Past Medical History:  Diagnosis Date  . Abdominal aortic aneurysm ([place]) 2/12   infrarenal diagnosed with maximum measurement at 3.3 cm  . Depression   . Diverticulosis   . GERD (gastroesophageal reflux disease)   . History of tobacco abuse    Recurrent cigarette smoking  . Hyperlipidemia   . Hyperplastic colon polyp   . Hypertension   . Hypothyroidism   . Ischemic heart disease    had redo bypass surgery in 2001, at that time she had a free right internal mammary to the LAD, LAD endarterectomy, sequential vein graft to the OM1 and OM2 -- last catheterization was in 2008  . Leg fracture    remote right leg  fracture  . Microscopic colitis   . Obesity    with gastric bypass surgery in 2004  . PAD (peripheral artery disease) ([place]) 04/13/2011   right iliac occlusion  . PONV (postoperative nausea and vomiting)     Past Surgical History:  Procedure Laterality Date  . CARDIAC CATHETERIZATION  02/16/1999   Left heart catheterization with selective coronary  angiography, left ventricular angiography (RAO and LAO views), angiography of left internal mammary artery, and angiography of saphenous vein grafts  . CARDIAC CATHETERIZATION  10/20/2006   Est. EF of 55% -- Totally occuladed native coronary circulation -- Persistent patency of the right mammary graft and the distal LAD artery with peristent patency of the left mammary graft to the proximal LAD artery with persistant patency of the saphenous vein graft to the sequentail branches of the OM        . CARPAL TUNNEL RELEASE    . CHOLECYSTECTOMY    . COLONOSCOPY  07/12/2007   Dr.Brodie- normal cecum and rectum, diverticulosis in the sigmoid colon  . COLONOSCOPY  1991   per Dr.Brodie office visit note= hyperplastic polyp  . COLONOSCOPY N/A 09/30/2014   Dr.Rourk- noraml appearing rectal mucosa, scattered L sided diverticula, colonic mucosa has a somewhat pale friable appearence diffusely, no  ulcers or erosions seen, the distal 10cm of terminal ileum appeared entirely normal. bx=benign colorectal mucosa with lymphocytic colitis  . CORONARY ARTERY BYPASS GRAFT  1985  . CORONARY ARTERY BYPASS GRAFT  03/09/1999   Redo CABG x3 --  with the free right internal mammary artey to the LAD with an LAD endarterectomy -- Sequential saphenous vein graft to OM1 and OM2                   . ESOPHAGOGASTRODUODENOSCOPY N/A 09/30/2014   Dr.Rourk-mild erosive reflux esophagitis s/p bariatric surgery  . GASTRIC BYPASS  2004     Current Outpatient Prescriptions  Medication Sig Dispense Refill  . aspirin 325 MG tablet Take 81 mg by mouth daily.     . budesonide (ENTOCORT EC)  3 MG 24 hr capsule Take 3 mg by mouth daily.   1  . FLUoxetine (PROZAC) 40 MG capsule TAKE ONE CAPSULE BY MOUTH EVERY DAY 90 capsule 1  . furosemide (LASIX) 20 MG tablet Take 1 tablet (20 mg total) by mouth daily as needed for edema. 30 tablet 1  . levothyroxine (SYNTHROID, LEVOTHROID) 50 MCG tablet TAKE 1 TABLET ONCE DAILY BEFORE BREAKFAST. TAKE AN EXTRA 1/2 TABLET ONE TIME EACH WEEK 96 tablet 6  . loperamide (IMODIUM) 2 MG capsule Take by mouth as needed for diarrhea or loose stools. Reported on 05/01/2015    . Melatonin 1 MG CAPS Take 1 tablet by mouth at bedtime.    . Nebivolol HCl (BYSTOLIC) 20 MG TABS Take 1 tablet (20 mg total) by mouth daily. 30 tablet 11  . nitroGLYCERIN (NITROSTAT) 0.4 MG SL tablet Place 1 tablet (0.4 mg total) under the tongue every 5 (five) minutes as needed. 25 tablet 11  . pantoprazole (PROTONIX) 40 MG tablet Take 1 tablet (40 mg total) by mouth daily. 30 tablet 11  . simvastatin (ZOCOR) 40 MG tablet Take 1 tablet (40 mg total) by mouth at bedtime. 90 tablet 2  . valsartan (DIOVAN) 80 MG tablet Take 1 tablet (80 mg total) by mouth 2 (two) times daily. 60 tablet 1   No current facility-administered medications for this visit.     Allergies:   Dipyridamole; Varenicline; Chantix [varenicline tartrate]; and Dipyridamole    Social History:  The patient  reports that she quit smoking about 5 months ago. Her smoking use included Cigarettes. She started smoking about 31 years ago. She smoked 0.50 packs per day. She has never used smokeless tobacco. She reports that she does not drink alcohol or use drugs.   Family History:  The patient's family history includes Diabetes in her mother; Pancreatic cancer in her mother; Stroke in her father.    ROS:  General:no colds or fevers, no weight changes Skin:no rashes or ulcers HEENT:no blurred vision, no congestion CV:see HPI PUL:see HPI GI:no diarrhea constipation or melena, no indigestion GU:no hematuria, no dysuria MS:no  joint pain, no claudication Neuro:no syncope, no lightheadedness Endo:no diabetes, no thyroid disease Wt Readings from Last 3 Encounters:  07/21/16 222 lb 1.9 oz (100.8 kg)  07/07/16 220 lb 12.8 oz (100.2 kg)  06/07/16 217 lb 3.2 oz (98.5 kg)     PHYSICAL EXAM: VS:  BP 134/60   Pulse 62   Ht 5\' 6"  (1.676 m)   Wt 222 lb 1.9 oz (100.8 kg)   BMI 35.85 kg/m  , BMI Body mass index is 35.85 kg/m. General:Pleasant affect, NAD Skin:Warm and dry, brisk capillary refill HEENT:normocephalic, sclera clear, mucus membranes moist Neck:supple,  no JVD, no bruits  Heart:S1S2 RRR without murmur, gallup, rub or click Lungs:clear without rales, rhonchi, or wheezes WLS:LHTD, non tender, + BS, do not palpate liver spleen or masses Ext:no lower ext edema, 2+ pedal pulses, 2+ radial pulses Neuro:alert and oriented, MAE, follows commands, + facial symmetry    EKG:  EKG is ordered today. 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    Recent Labs: 07/07/2016: ALT 13; BUN 14; Creatinine, Ser 0.88; Hemoglobin 14.2; Platelets 195; Potassium 4.4; Sodium 143; TSH 3.180    Lipid Panel    Component Value Date/Time   CHOL 149 02/04/2014 0831   TRIG 124 02/04/2014 0831   HDL 60 02/04/2014 0831   CHOLHDL 2.5 02/04/2014 0831   VLDL 25 02/04/2014 0831   LDLCALC 64 02/04/2014 0831       Other studies Reviewed: Additional studies/ records that were reviewed today include: . Echo 04/2016 Study Conclusions  - Left ventricle: The cavity size was normal. Wall thickness was   normal. Systolic function was normal. The estimated ejection   fraction was in the range of 60% to 65%. Wall motion was normal;   there were no regional wall motion abnormalities. Doppler   parameters are consistent with pseudonormal left ventricular   relaxation (grade 2 diastolic dysfunction). The E/e&' ratio is   >15, suggesting elevated LV filling pressure. - Aortic valve: Calcified leaflets.  Moderate stenosis. Mean   gradient (S): 35 mm Hg. Peak gradient (S): 61 mm Hg. Valve area   (VTI): 0.98 cm^2. Valve area (Vmax): 1.05 cm^2. Valve area   (Vmean): 0.98 cm^2. - Mitral valve: Mildly thickened leaflets . There was mild   regurgitation. - Left atrium: The atrium was moderately dilated. - Right ventricle: The cavity size was mildly dilated. Systolic   function is mildly reduced. - Tricuspid valve: There was mild regurgitation. - Pulmonary arteries: PA peak pressure: 34 mm Hg (S). - Inferior vena cava: The vessel was normal in size. The   respirophasic diameter changes were in the normal range (>= 50%),   consistent with normal central venous pressure.  Impressions:  - Compared to a prior study in 2012, there is now moderate aortic   stenosis - AVA around 1.05 cm2 with mean gradient of 35 mmHg.   ASSESSMENT AND PLAN:  1. HTN with improved control, but will increase diovan to 320 mg daily.  She will take at night.  Once BP is controlled I will change bystolic to toprol XL as bystolic is too expensive for her.  Previous symptoms of feeling off balance have improved to resolved.    2. CAD with stable EKG.  No chest pain.  Hx of CABG and Re-do CABG.  3. AAA yearly u/s prior to visit with Dr. Martinique.    Current medicines are reviewed with the patient today.  The patient Has no concerns regarding medicines.  The following changes have been made:  See above Labs/ tests ordered today include:see above  Disposition:   FU:  see above  Signed, Cecilie Kicks, NP  07/21/2016 2:37 PM    [facility], [place], [place], [place] Phone: [phone]; Fax: [phone]

## 2016-07-21 NOTE — Patient Instructions (Signed)
Medication Instructions:   START TAKING DIOVAN 320 MG ONCE A DAY IN THE EVENING   If you need a refill on your cardiac medications before your next appointment, please call your pharmacy.  Labwork: NONE ORDERED  TODAY    Testing/Procedures: NONE ORDERED  TODAY   Follow-Up: WITH INGOLD THE WEEK OF 08-15-16 PER INGOLD.[name]   Any Other Special Instructions Will Be Listed Below (If Applicable).

## 2016-07-25 ENCOUNTER — Telehealth: Payer: Self-pay | Admitting: Cardiology

## 2016-07-25 NOTE — Telephone Encounter (Signed)
Returned call to patient.She stated her B/P is still elevated in the mornings 176/82,173/78,151/92 pulse 60 to 70.Spoke to [place] he advised to continue diovan 242 mg in pm,bystolic 20 mg am.Advised diovan just increased 07/21/16.Advised continue to monitor and keep appointments as planned.

## 2016-07-25 NOTE — Telephone Encounter (Signed)
Please call,still having problems with her blood pressure.

## 2016-07-26 ENCOUNTER — Encounter: Payer: Self-pay | Admitting: Internal Medicine

## 2016-07-26 ENCOUNTER — Other Ambulatory Visit: Payer: Self-pay

## 2016-07-26 ENCOUNTER — Ambulatory Visit (INDEPENDENT_AMBULATORY_CARE_PROVIDER_SITE_OTHER): Payer: Medicare Other | Admitting: Internal Medicine

## 2016-07-26 ENCOUNTER — Ambulatory Visit: Payer: Medicare Other | Admitting: Gastroenterology

## 2016-07-26 VITALS — BP 132/64 | HR 62 | Temp 97.0°F | Ht 66.5 in | Wt 224.2 lb

## 2016-07-26 DIAGNOSIS — K52832 Lymphocytic colitis: Secondary | ICD-10-CM

## 2016-07-26 DIAGNOSIS — R197 Diarrhea, unspecified: Secondary | ICD-10-CM | POA: Diagnosis not present

## 2016-07-26 DIAGNOSIS — K219 Gastro-esophageal reflux disease without esophagitis: Secondary | ICD-10-CM | POA: Diagnosis not present

## 2016-07-26 NOTE — Patient Instructions (Signed)
GI pathogen / C Diff stool assay  May continue Levsin as needed  Further recommendations to follow

## 2016-07-26 NOTE — Progress Notes (Signed)
Primary Care Physician:  [name], MD Primary Gastroenterologist:  Dr. Gala Romney  Pre-Procedure History & Physical: HPI:  [name] is a 76 y.o. female here for follow-up of lymphocytic colitis and GERD. The patient initiating a tapering regimen of Entocort-down to 3 mg daily. Diarrhea  has not resolved. Having 3-4 loose nonbloody stools during the daytime and also gets up frequently at night to have a BM -loose.  Exposures to antibiotics several weeks ago. No bleeding reflux symptoms well controlled on Protonix 40 mg daily. No dysphagia.   No recent travel. No new medications to implicate.  Past Medical History:  Diagnosis Date  . Abdominal aortic aneurysm ([place]) 2/12   infrarenal diagnosed with maximum measurement at 3.3 cm  . Depression   . Diverticulosis   . GERD (gastroesophageal reflux disease)   . History of tobacco abuse    Recurrent cigarette smoking  . Hyperlipidemia   . Hyperplastic colon polyp   . Hypertension   . Hypothyroidism   . Ischemic heart disease    had redo bypass surgery in 2001, at that time she had a free right internal mammary to the LAD, LAD endarterectomy, sequential vein graft to the OM1 and OM2 -- last catheterization was in 2008  . Leg fracture    remote right leg fracture  . Microscopic colitis   . Obesity    with gastric bypass surgery in 2004  . PAD (peripheral artery disease) ([place]) 04/13/2011   right iliac occlusion  . PONV (postoperative nausea and vomiting)     Past Surgical History:  Procedure Laterality Date  . CARDIAC CATHETERIZATION  02/16/1999   Left heart catheterization with selective coronary  angiography, left ventricular angiography (RAO and LAO views), angiography of left internal mammary artery, and angiography of saphenous vein grafts  . CARDIAC CATHETERIZATION  10/20/2006   Est. EF of 55% -- Totally occuladed native coronary circulation -- Persistent patency of the right mammary graft and the distal LAD artery with  peristent patency of the left mammary graft to the proximal LAD artery with persistant patency of the saphenous vein graft to the sequentail branches of the OM        . CARPAL TUNNEL RELEASE    . CHOLECYSTECTOMY    . COLONOSCOPY  07/12/2007   Dr.Brodie- normal cecum and rectum, diverticulosis in the sigmoid colon  . COLONOSCOPY  1991   per Dr.Brodie office visit note= hyperplastic polyp  . COLONOSCOPY N/A 09/30/2014   Dr.Genetta Fiero- noraml appearing rectal mucosa, scattered L sided diverticula, colonic mucosa has a somewhat pale friable appearence diffusely, no ulcers or erosions seen, the distal 10cm of terminal ileum appeared entirely normal. bx=benign colorectal mucosa with lymphocytic colitis  . CORONARY ARTERY BYPASS GRAFT  1985  . CORONARY ARTERY BYPASS GRAFT  03/09/1999   Redo CABG x3 --  with the free right internal mammary artey to the LAD with an LAD endarterectomy -- Sequential saphenous vein graft to OM1 and OM2                   . ESOPHAGOGASTRODUODENOSCOPY N/A 09/30/2014   Dr.Audwin Semper-mild erosive reflux esophagitis s/p bariatric surgery  . GASTRIC BYPASS  2004    Prior to Admission medications   Medication Sig Start Date End Date Taking? Authorizing Provider  acetaminophen (TYLENOL) 500 MG tablet Take 1,000 mg by mouth every 6 (six) hours as needed.   Yes [provider]  aspirin 325 MG tablet Take 81 mg by mouth daily.  Yes [provider]  budesonide (ENTOCORT EC) 3 MG 24 hr capsule Take 3 mg by mouth daily.  07/05/16  Yes [provider]  FLUoxetine (PROZAC) 40 MG capsule TAKE ONE CAPSULE BY MOUTH EVERY DAY 10/19/15  Yes Burtis Junes, NP  furosemide (LASIX) 20 MG tablet Take 1 tablet (20 mg total) by mouth daily as needed for edema. 05/28/15  Yes Martinique, Peter M, MD  levothyroxine (SYNTHROID, LEVOTHROID) 50 MCG tablet TAKE 1 TABLET ONCE DAILY BEFORE BREAKFAST. TAKE AN EXTRA 1/2 TABLET ONE TIME EACH WEEK Patient taking differently: TAKE 1 TABLET ONCE DAILY  BEFORE BREAKFAST. TAKE AN EXTRA 1/2 TABLET ONE TIME EACH WEEK. Takes 1 tablet daily 12/18/15  Yes Martinique, Peter M, MD  loperamide (IMODIUM) 2 MG capsule Take by mouth as needed for diarrhea or loose stools. Reported on 05/01/2015   Yes [provider]  Melatonin 1 MG CAPS Take 1 tablet by mouth at bedtime.   Yes [provider]  Nebivolol HCl (BYSTOLIC) 20 MG TABS Take 1 tablet (20 mg total) by mouth daily. 04/04/16  Yes Martinique, Peter M, MD  nitroGLYCERIN (NITROSTAT) 0.4 MG SL tablet Place 1 tablet (0.4 mg total) under the tongue every 5 (five) minutes as needed. 03/11/11  Yes Martinique, Peter M, MD  pantoprazole (PROTONIX) 40 MG tablet Take 1 tablet (40 mg total) by mouth daily. Patient taking differently: Take 40 mg by mouth daily. Sometimes takes twice a day 07/15/16  Yes Mahala Menghini, PA-C  simvastatin (ZOCOR) 40 MG tablet Take 1 tablet (40 mg total) by mouth at bedtime. 05/05/16  Yes Martinique, Peter M, MD  valsartan (DIOVAN) 320 MG tablet Take 1 tablet (320 mg total) by mouth daily. 07/21/16  Yes Isaiah Serge, NP    Allergies as of 07/26/2016 - Review Complete 07/26/2016  Allergen Reaction Noted  . Dipyridamole Hives 01/16/2015  . Varenicline Hives 01/16/2015  . Chantix [varenicline tartrate]  03/08/2011  . Dipyridamole  08/10/2010    Family History  Problem Relation Age of Onset  . Stroke Father   . Diabetes Mother   . Pancreatic cancer Mother     Social History   Social History  . Marital status: Married    Spouse name: N/A  . Number of children: N/A  . Years of education: N/A   Occupational History  . Not on file.   Social History Main Topics  . Smoking status: Former Smoker    Packs/day: 0.50    Types: Cigarettes    Start date: 01/10/1985    Quit date: 02/11/2016  . Smokeless tobacco: Never Used  . Alcohol use No  . Drug use: No  . Sexual activity: No   Other Topics Concern  . Not on file   Social History Narrative  . No narrative on file     Review of Systems: See HPI, otherwise negative ROS  Physical Exam: BP 132/64   Pulse 62   Temp (!) 97 F (36.1 C) (Oral)   Ht 5' 6.5" (1.689 m)   Wt 224 lb 3.2 oz (101.7 kg)   BMI 35.64 kg/m  General:   Alert, , pleasant and cooperative in NAD Neck:  Supple; no masses or thyromegaly. No significant cervical adenopathy. Lungs:  Clear throughout to auscultation.   No wheezes, crackles, or rhonchi. No acute distress. Heart:  Regular rate and rhythm; no murmurs, clicks, rubs,  or gallops. Abdomen: Non-distended, normal bowel sounds.  Soft and nontender without appreciable mass or hepatosplenomegaly.  Pulses:  Normal pulses noted. Extremities:  Without clubbing or edema.  Impression: GERD symptoms well controlled on Protonix 40 mg daily. Diarrhea inadequately controlled.   Antibiotics given several weeks ago. Need to rule out C. difficile etc.  Recommendations:  GI pathogen / C Diff stool assay  May continue Levsin as needed  Further recommendations to follow        Notice: This dictation was prepared with Dragon dictation along with smaller phrase technology. Any transcriptional errors that result from this process are unintentional and may not be corrected upon review.

## 2016-07-27 ENCOUNTER — Telehealth: Payer: Self-pay

## 2016-07-27 ENCOUNTER — Other Ambulatory Visit: Payer: Self-pay

## 2016-07-27 NOTE — Telephone Encounter (Signed)
I called Vaughan Basta at [facility] this AM in reference to the lab orders for the stools for pt . The Stool C diff essay would not let me choose [facility], It had printed off as External Lab.  The pt had said she was bringing the stool to [facility] this Am, and Vaughan Basta said she could make the change.  She is also reporting that we are having difficulty choosing [facility] so that can be corrected.   I made [facility] aware of the problem.

## 2016-07-27 NOTE — Telephone Encounter (Signed)
Reviewed

## 2016-07-28 LAB — GASTROINTESTINAL PATHOGEN PANEL PCR
C. difficile Tox A/B, PCR: NOT DETECTED
CRYPTOSPORIDIUM, PCR: NOT DETECTED
Campylobacter, PCR: NOT DETECTED
E coli (ETEC) LT/ST PCR: NOT DETECTED
E coli (STEC) stx1/stx2, PCR: NOT DETECTED
E coli 0157, PCR: NOT DETECTED
GIARDIA LAMBLIA, PCR: NOT DETECTED
NOROVIRUS, PCR: NOT DETECTED
ROTAVIRUS, PCR: NOT DETECTED
Salmonella, PCR: NOT DETECTED
Shigella, PCR: NOT DETECTED

## 2016-07-28 LAB — C. DIFFICILE GDH AND TOXIN A/B
C. DIFF TOXIN A/B: NOT DETECTED
C. difficile GDH: NOT DETECTED

## 2016-08-01 ENCOUNTER — Other Ambulatory Visit: Payer: Self-pay | Admitting: Internal Medicine

## 2016-08-01 MED ORDER — BUDESONIDE 3 MG PO CPEP: 9.0000 mg | ORAL_CAPSULE | Freq: Every day | ORAL | 0 refills | Status: DC

## 2016-08-02 ENCOUNTER — Encounter: Payer: Self-pay | Admitting: Gastroenterology

## 2016-08-02 NOTE — Progress Notes (Signed)
APPT MADE AND LETTER SENT  °

## 2016-08-03 ENCOUNTER — Other Ambulatory Visit: Payer: Self-pay | Admitting: Cardiology

## 2016-08-03 DIAGNOSIS — I739 Peripheral vascular disease, unspecified: Secondary | ICD-10-CM

## 2016-08-10 ENCOUNTER — Ambulatory Visit (HOSPITAL_COMMUNITY)
Admission: RE | Admit: 2016-08-10 | Discharge: 2016-08-10 | Disposition: A | Discharge status: Home / Self Care | Payer: Medicare Other | Source: Ambulatory Visit | Attending: Cardiovascular Disease | Admitting: Cardiovascular Disease

## 2016-08-10 DIAGNOSIS — I714 Abdominal aortic aneurysm, without rupture, unspecified: Secondary | ICD-10-CM

## 2016-08-10 DIAGNOSIS — I739 Peripheral vascular disease, unspecified: Secondary | ICD-10-CM

## 2016-08-10 DIAGNOSIS — R9439 Abnormal result of other cardiovascular function study: Secondary | ICD-10-CM | POA: Insufficient documentation

## 2016-08-11 ENCOUNTER — Telehealth: Payer: Self-pay

## 2016-08-11 ENCOUNTER — Other Ambulatory Visit: Payer: Self-pay | Admitting: Internal Medicine

## 2016-08-11 MED ORDER — BUDESONIDE 3 MG PO CPEP: 9.0000 mg | ORAL_CAPSULE | Freq: Every day | ORAL | 0 refills | Status: DC

## 2016-08-11 NOTE — Telephone Encounter (Signed)
Pt called, entocort was sent to the wrong pharmacy. I have resent [facility] to the [facility] on [address].

## 2016-08-16 NOTE — Progress Notes (Signed)
Cardiology Office Note   Date:  08/18/2016   ID:  [name], [name] 07-Mar-1940, MRN [id number]  PCP:  [name], MD  Cardiologist:  Dr. Martinique     Chief Complaint  Patient presents with  . Hypertension  . Leg Swelling      History of Present Illness: [name] is a 76 y.o. female who presents for HTN-with recent escalation and Headache, now has colitis - with loose stools.   C diff neg, pt on entocort - her BP has increased now with the entocort and she has SOB and swelling.   Recent ABI and AAA duplex studies are stable  She has a hx of CAD and PAD. She has known CAD with redo CABG in 2001. She has a known abdominal aortic aneurysm with last ultrasound showing a 3.2 cm aneurysm in July 2017. She has a history of PAD with right iliac occlusion. Last ABIs in July 2017showed stable right ABI of 0.66. She has chronic claudication symptoms with pain in right thigh with walking. This has not changed.[name] Her last nuclear stress test in January 2016 showed anterior wall ischemia. This was unchanged from 2012 and 2013. She had a cardiac cath in 2012 which showed patent grafts.   Echo 04/2016 with EF 60-65%, aortic stenosis moderate , G 2DD, PA pk pressure 34 mmHg.   Last seen 07/07/16 for HTN and systolic BP 322 to 025 and feeling off balance.  I started diovan and we have increased over last 1-2 weeks.    We are now at 320 mg of diovan.  Today she does not feel well with headache which is side effect of entocort, but PB list from home up to 195/85.  And average 168.70. Her BP had improved with the valsartan.  And she had stated she felt much better.  She obliviously does not feel well today.  This AM more SOB.  No chest pain, some chest pressure with the SOB.    Past Medical History:  Diagnosis Date  . Abdominal aortic aneurysm ([place]) 2/12   infrarenal diagnosed with maximum measurement at 3.3 cm  . Depression   . Diverticulosis   . GERD (gastroesophageal reflux  disease)   . History of tobacco abuse    Recurrent cigarette smoking  . Hyperlipidemia   . Hyperplastic colon polyp   . Hypertension   . Hypothyroidism   . Ischemic heart disease    had redo bypass surgery in 2001, at that time she had a free right internal mammary to the LAD, LAD endarterectomy, sequential vein graft to the OM1 and OM2 -- last catheterization was in 2008  . Leg fracture    remote right leg fracture  . Microscopic colitis   . Obesity    with gastric bypass surgery in 2004  . PAD (peripheral artery disease) ([place]) 04/13/2011   right iliac occlusion  . PONV (postoperative nausea and vomiting)     Past Surgical History:  Procedure Laterality Date  . CARDIAC CATHETERIZATION  02/16/1999   Left heart catheterization with selective coronary  angiography, left ventricular angiography (RAO and LAO views), angiography of left internal mammary artery, and angiography of saphenous vein grafts  . CARDIAC CATHETERIZATION  10/20/2006   Est. EF of 55% -- Totally occuladed native coronary circulation -- Persistent patency of the right mammary graft and the distal LAD artery with peristent patency of the left mammary graft to the proximal LAD artery with persistant patency of the saphenous vein  graft to the sequentail branches of the OM        . CARPAL TUNNEL RELEASE    . CHOLECYSTECTOMY    . COLONOSCOPY  07/12/2007   Dr.Brodie- normal cecum and rectum, diverticulosis in the sigmoid colon  . COLONOSCOPY  1991   per Dr.Brodie office visit note= hyperplastic polyp  . COLONOSCOPY N/A 09/30/2014   Dr.Rourk- noraml appearing rectal mucosa, scattered L sided diverticula, colonic mucosa has a somewhat pale friable appearence diffusely, no ulcers or erosions seen, the distal 10cm of terminal ileum appeared entirely normal. bx=benign colorectal mucosa with lymphocytic colitis  . CORONARY ARTERY BYPASS GRAFT  1985  . CORONARY ARTERY BYPASS GRAFT  03/09/1999   Redo CABG x3 --  with the free right  internal mammary artey to the LAD with an LAD endarterectomy -- Sequential saphenous vein graft to OM1 and OM2                   . ESOPHAGOGASTRODUODENOSCOPY N/A 09/30/2014   Dr.Rourk-mild erosive reflux esophagitis s/p bariatric surgery  . GASTRIC BYPASS  2004     Current Outpatient Prescriptions  Medication Sig Dispense Refill  . acetaminophen (TYLENOL) 500 MG tablet Take 1,000 mg by mouth every 6 (six) hours as needed (pain).     [name] aspirin EC 81 MG tablet Take 81 mg by mouth daily.    . budesonide (ENTOCORT EC) 3 MG 24 hr capsule Take 3 capsules (9 mg total) by mouth daily. 90 capsule 0  . FLUoxetine (PROZAC) 40 MG capsule TAKE ONE CAPSULE BY MOUTH EVERY DAY 90 capsule 1  . levothyroxine (SYNTHROID, LEVOTHROID) 50 MCG tablet Take 50 mcg by mouth daily before breakfast.    . loperamide (IMODIUM) 2 MG capsule Take by mouth as needed for diarrhea or loose stools (Take as directed). Reported on 05/01/2015    . Melatonin 1 MG CAPS Take 1 tablet by mouth at bedtime.    . Nebivolol HCl (BYSTOLIC) 20 MG TABS Take 1 tablet (20 mg total) by mouth daily. 30 tablet 11  . nitroGLYCERIN (NITROSTAT) 0.4 MG SL tablet Place 0.4 mg under the tongue every 5 (five) minutes as needed for chest pain (MAX 3 TABLETS).    . pantoprazole (PROTONIX) 40 MG tablet Take 1 tablet (40 mg total) by mouth daily. 30 tablet 11  . simvastatin (ZOCOR) 40 MG tablet Take 1 tablet (40 mg total) by mouth at bedtime. 90 tablet 2  . valsartan (DIOVAN) 320 MG tablet Take 1 tablet (320 mg total) by mouth daily. 30 tablet 3  . amLODipine (NORVASC) 5 MG tablet Take 1 tablet (5 mg total) by mouth daily. 30 tablet 11  . furosemide (LASIX) 20 MG tablet Take 20 mg by mouth daily. Can take an extra 20 mg daily as needed for shortness of breath or swelling 45 tablet 11   No current facility-administered medications for this visit.     Allergies:   Dipyridamole; Varenicline; Chantix [varenicline tartrate]; and Dipyridamole    Social  History:  The patient  reports that she quit smoking about 6 months ago. Her smoking use included Cigarettes. She started smoking about 31 years ago. She smoked 0.50 packs per day. She has never used smokeless tobacco. She reports that she does not drink alcohol or use drugs.   Family History:  The patient's family history includes Diabetes in her mother; Pancreatic cancer in her mother; Stroke in her father.    ROS:  General:no colds or fevers, no weight changes  Skin:no rashes or ulcers HEENT:no blurred vision, no congestion CV:see HPI PUL:see HPI GI:no diarrhea constipation or melena, no indigestion GU:no hematuria, no dysuria MS:no joint pain, no claudication Neuro:no syncope, no lightheadedness Endo:no diabetes, no thyroid disease  Wt Readings from Last 3 Encounters:  08/18/16 223 lb 6.4 oz (101.3 kg)  07/26/16 224 lb 3.2 oz (101.7 kg)  07/21/16 222 lb 1.9 oz (100.8 kg)     PHYSICAL EXAM: VS:  BP (!) 152/92   Pulse 75   Ht 5\' 6"  (1.676 m)   Wt 223 lb 6.4 oz (101.3 kg)   SpO2 98%   BMI 36.06 kg/m  , BMI Body mass index is 36.06 kg/m. General:Pleasant affect, NAD Skin:Warm and dry, brisk capillary refill HEENT:normocephalic, sclera clear, mucus membranes moist Neck:supple, no JVD, no bruits  Heart:S1S2 RRR without murmur, gallup, rub or click Lungs:clear with fine rales bases, no rhonchi, or wheezes KGM:WNUU, non tender, + BS, do not palpate liver spleen or masses Ext:1-2+ lower ext edema, 2+ pedal pulses, 2+ radial pulses Neuro:alert and oriented X 3, MAE, follows commands, + facial symmetry    EKG:  EKG is NOT ordered today.    Recent Labs: 07/07/2016: ALT 13; BUN 14; Creatinine, Ser 0.88; Hemoglobin 14.2; Platelets 195; Potassium 4.4; Sodium 143; TSH 3.180    Lipid Panel    Component Value Date/Time   CHOL 149 02/04/2014 0831   TRIG 124 02/04/2014 0831   HDL 60 02/04/2014 0831   CHOLHDL 2.5 02/04/2014 0831   VLDL 25 02/04/2014 0831   LDLCALC 64  02/04/2014 0831       Other studies Reviewed: Additional studies/ records that were reviewed today include: . Reviewed ABIs and AAA duplex  Echo 04/19/16  Study Conclusions  - Left ventricle: The cavity size was normal. Wall thickness was   normal. Systolic function was normal. The estimated ejection   fraction was in the range of 60% to 65%. Wall motion was normal;   there were no regional wall motion abnormalities. Doppler   parameters are consistent with pseudonormal left ventricular   relaxation (grade 2 diastolic dysfunction). The E/e&' ratio is   >15, suggesting elevated LV filling pressure. - Aortic valve: Calcified leaflets. Moderate stenosis. Mean   gradient (S): 35 mm Hg. Peak gradient (S): 61 mm Hg. Valve area   (VTI): 0.98 cm^2. Valve area (Vmax): 1.05 cm^2. Valve area   (Vmean): 0.98 cm^2. - Mitral valve: Mildly thickened leaflets . There was mild   regurgitation. - Left atrium: The atrium was moderately dilated. - Right ventricle: The cavity size was mildly dilated. Systolic   function is mildly reduced. - Tricuspid valve: There was mild regurgitation. - Pulmonary arteries: PA peak pressure: 34 mm Hg (S). - Inferior vena cava: The vessel was normal in size. The   respirophasic diameter changes were in the normal range (>= 50%),   consistent with normal central venous pressure.  Impressions:  - Compared to a prior study in 2012, there is now moderate aortic   stenosis - AVA around 1.05 cm2 with mean gradient of 35 mmHg.   ASSESSMENT AND PLAN:  1.  HTN, now on glyciosteroids for colitis,  I believe this with increased edema is increasing her BP, for now she will take lasix 20 mg daily, though today she will take 40 mg - and I am adding amlodipine 5 mg daily.  This should improve her BP.  She is to follow up with Dr. Martinique next month.  If no improvement  possible need for renal artery duplex- I did not see mention of these on AAA duplex  Continue other meds,  her Valsartan is not made by companies affected with cancer causing ingredient.    2.  SOB, edema add lasix daily instead of PRN.  Check BMP on Friday next week.  3.  CAD stable hx of CABG and Re-do CABG   4.  AAA stable      Current medicines are reviewed with the patient today.  The patient Has no concerns regarding medicines.  The following changes have been made:  See above Labs/ tests ordered today include:see above  Disposition:   FU:  see above  Signed, Cecilie Kicks, NP  08/18/2016 9:36 AM    [facility], [place], [address] [place], [place] Phone: [phone]; Fax: [phone]

## 2016-08-18 ENCOUNTER — Ambulatory Visit (INDEPENDENT_AMBULATORY_CARE_PROVIDER_SITE_OTHER): Payer: Medicare Other | Admitting: Cardiology

## 2016-08-18 ENCOUNTER — Encounter: Payer: Self-pay | Admitting: Cardiology

## 2016-08-18 ENCOUNTER — Other Ambulatory Visit: Payer: Self-pay | Admitting: Pharmacist Clinician (PhC)/ Clinical Pharmacy Specialist

## 2016-08-18 VITALS — BP 152/92 | HR 75 | Ht 66.0 in | Wt 223.4 lb

## 2016-08-18 DIAGNOSIS — R0602 Shortness of breath: Secondary | ICD-10-CM | POA: Diagnosis not present

## 2016-08-18 DIAGNOSIS — I10 Essential (primary) hypertension: Secondary | ICD-10-CM | POA: Diagnosis not present

## 2016-08-18 DIAGNOSIS — I25810 Atherosclerosis of coronary artery bypass graft(s) without angina pectoris: Secondary | ICD-10-CM

## 2016-08-18 DIAGNOSIS — I714 Abdominal aortic aneurysm, without rupture, unspecified: Secondary | ICD-10-CM

## 2016-08-18 MED ORDER — FUROSEMIDE 20 MG PO TABS: ORAL_TABLET | ORAL | 11 refills | Status: DC

## 2016-08-18 MED ORDER — IRBESARTAN 300 MG PO TABS: 300.0000 mg | ORAL_TABLET | Freq: Every day | ORAL | 6 refills | Status: DC

## 2016-08-18 MED ORDER — AMLODIPINE BESYLATE 5 MG PO TABS: 5.0000 mg | ORAL_TABLET | Freq: Every day | ORAL | 11 refills | Status: DC

## 2016-08-18 NOTE — Telephone Encounter (Signed)
Spoke with patient.  She saw Cecilie Kicks NP earlier today.  Laura's note indicates that patient stated her valsartan was not included in recall, however we received request today for alternative.    No dx of CHF, so will switch to irbesartan 300 mg daily.  She is still to take the amlodipine that was prescribed today.   Patient voiced understanding

## 2016-08-18 NOTE — Patient Instructions (Signed)
Medication Instructions:  Your physician has recommended you make the following change in your medication: 1.) START Lasix 20 mg daily. You can take and extra pill if you feel shortness of breath. 2.) START Amlodipine 5 mg daily.   Labwork: Your physician recommends that you return for lab work on 8/17 for a BMET.  Testing/Procedures: None Ordered   Follow-Up: Your physician recommends that you schedule a follow-up appointment: keep your follow up appointment with Dr. Martinique.   Any Other Special Instructions Will Be Listed Below (If Applicable).     If you need a refill on your cardiac medications before your next appointment, please call your pharmacy.

## 2016-08-24 ENCOUNTER — Encounter: Payer: Self-pay | Admitting: Cardiology

## 2016-08-26 ENCOUNTER — Other Ambulatory Visit: Payer: Medicare Other | Admitting: *Deleted

## 2016-08-26 DIAGNOSIS — R0602 Shortness of breath: Secondary | ICD-10-CM

## 2016-08-26 LAB — BASIC METABOLIC PANEL
BUN / CREAT RATIO: 20 (ref 12–28)
BUN: 20 mg/dL (ref 8–27)
CHLORIDE: 102 mmol/L (ref 96–106)
CO2: 24 mmol/L (ref 20–29)
Calcium: 9.3 mg/dL (ref 8.7–10.3)
Creatinine, Ser: 1 mg/dL (ref 0.57–1.00)
GFR calc non Af Amer: 56 mL/min/{1.73_m2} — ABNORMAL LOW (ref >59)
GFR, EST AFRICAN AMERICAN: 65 mL/min/{1.73_m2} (ref >59)
Glucose: 94 mg/dL (ref 65–99)
Potassium: 3.8 mmol/L (ref 3.5–5.2)
Sodium: 141 mmol/L (ref 134–144)

## 2016-08-29 ENCOUNTER — Telehealth: Payer: Self-pay | Admitting: *Deleted

## 2016-08-29 NOTE — Telephone Encounter (Signed)
Patient informed and verbalized understanding

## 2016-08-29 NOTE — Telephone Encounter (Signed)
-----   Message from Consuelo Pandy, [place] sent at 08/26/2016  4:41 PM EDT ----- Kidney function and potassium levels are stable after starting lasix. Ok to continue

## 2016-09-01 ENCOUNTER — Ambulatory Visit (INDEPENDENT_AMBULATORY_CARE_PROVIDER_SITE_OTHER): Payer: Medicare Other | Admitting: Gastroenterology

## 2016-09-01 VITALS — BP 126/73 | HR 67 | Temp 98.3°F | Ht 66.0 in | Wt 210.2 lb

## 2016-09-01 DIAGNOSIS — K2100 Gastro-esophageal reflux disease with esophagitis, without bleeding: Secondary | ICD-10-CM

## 2016-09-01 DIAGNOSIS — K52832 Lymphocytic colitis: Secondary | ICD-10-CM | POA: Diagnosis not present

## 2016-09-01 NOTE — Patient Instructions (Addendum)
1. In two weeks, drop Entocort down to 6 mg daily. I recommend continuing 6mg  daily for four weeks, then go down to 3mg  daily. Call if recurrent diarrhea.  2. Continue pantoprazole 40mg  twice daily before breakfast and evening meal for now. I will discuss your reflux and vomiting with Dr. Gala Romney, if any additional recommendations we will call you. 3. Please be careful with food intake, avoid greasy/fried foods, avoid overeating. See below for list of foods to avoid.  4. Return to the office in 6 weeks.  5. Continue to monitor your blood pressure, report any lows ie blood pressure below 100/60.    Food Choices for Gastroesophageal Reflux Disease, Adult When you have gastroesophageal reflux disease (GERD), the foods you eat and your eating habits are very important. Choosing the right foods can help ease the discomfort of GERD. Consider working with a diet and nutrition specialist (dietitian) to help you make healthy food choices. What general guidelines should I follow? Eating plan  Choose healthy foods low in fat, such as fruits, vegetables, whole grains, low-fat dairy products, and lean meat, fish, and poultry.  Eat frequent, small meals instead of three large meals each day. Eat your meals slowly, in a relaxed setting. Avoid bending over or lying down until 2-3 hours after eating.  Limit high-fat foods such as fatty meats or fried foods.  Limit your intake of oils, butter, and shortening to less than 8 teaspoons each day.  Avoid the following: ? Foods that cause symptoms. These may be different for different people. Keep a food diary to keep track of foods that cause symptoms. ? Alcohol. ? Drinking large amounts of liquid with meals. ? Eating meals during the 2-3 hours before bed.  Cook foods using methods other than frying. This may include baking, grilling, or broiling. Lifestyle   Maintain a healthy weight. Ask your health care provider what weight is healthy for you. If you need to  lose weight, work with your health care provider to do so safely.  Exercise for at least 30 minutes on 5 or more days each week, or as told by your health care provider.  Avoid wearing clothes that fit tightly around your waist and chest.  Do not use any products that contain nicotine or tobacco, such as cigarettes and e-cigarettes. If you need help quitting, ask your health care provider.  Sleep with the head of your bed raised. Use a wedge under the mattress or blocks under the bed frame to raise the head of the bed. What foods are not recommended? The items listed may not be a complete list. Talk with your dietitian about what dietary choices are best for you. Grains Pastries or quick breads with added fat. [place] toast. Vegetables Deep fried vegetables. [place] fries. Any vegetables prepared with added fat. Any vegetables that cause symptoms. For some people this may include tomatoes and tomato products, chili peppers, onions and garlic, and horseradish. Fruits Any fruits prepared with added fat. Any fruits that cause symptoms. For some people this may include citrus fruits, such as oranges, grapefruit, pineapple, and lemons. Meats and other protein foods High-fat meats, such as fatty beef or pork, hot dogs, ribs, ham, sausage, salami and bacon. Fried meat or protein, including fried fish and fried chicken. Nuts and nut butters. Dairy Whole milk and chocolate milk. Sour cream. Cream. Ice cream. Cream cheese. Milk shakes. Beverages Coffee and tea, with or without caffeine. Carbonated beverages. Sodas. Energy drinks. Fruit juice made with acidic fruits (  such as orange or grapefruit). Tomato juice. Alcoholic drinks. Fats and oils Butter. Margarine. Shortening. Ghee. Sweets and desserts Chocolate and cocoa. Donuts. Seasoning and other foods Pepper. Peppermint and spearmint. Any condiments, herbs, or seasonings that cause symptoms. For some people, this may include curry, hot sauce, or  vinegar-based salad dressings. Summary  When you have gastroesophageal reflux disease (GERD), food and lifestyle choices are very important to help ease the discomfort of GERD.  Eat frequent, small meals instead of three large meals each day. Eat your meals slowly, in a relaxed setting. Avoid bending over or lying down until 2-3 hours after eating.  Limit high-fat foods such as fatty meat or fried foods. This information is not intended to replace advice given to you by your health care provider. Make sure you discuss any questions you have with your health care provider. Document Released: 12/27/2004 Document Revised: 12/29/2015 Document Reviewed: 12/29/2015 Elsevier Interactive Patient Education  2017 [employer].

## 2016-09-01 NOTE — Progress Notes (Signed)
Primary Care Physician: [name], MD  Primary Gastroenterologist:  Garfield Cornea, MD   Chief Complaint  Patient presents with  . Diarrhea    HPI: [name] is a 76 y.o. female here for follow up of lymphocytic colitis and GERD. She was seen 07/2016. Had tapered down to Entocort 3mg  daily and having 3-4 loose nonbloody stools and nocturnal stools. Cdiff and gi path panel both negative 07/2016. entocort increased to 9mg  daily.  Patient reports increase in leg swelling and blood pressure on Entocort. Added lasix and norvasc. Her Diovan was increased as well. Slowly improved.   BM now once daily. Rare loose stool. She has had some reflux and vomiting. Heartburn not well controlled. on BIP PPI. Taking rolaids and pepto. Worse with certain foods. Symptoms poorly controlled for past one month.No dysphagia. Complains of terrible cramps after meals. Levsin didn't seem to help. Takes two Imodium every morning.    Current Outpatient Prescriptions  Medication Sig Dispense Refill  . acetaminophen (TYLENOL) 500 MG tablet Take 1,000 mg by mouth every 6 (six) hours as needed (pain).     [name] amLODipine (NORVASC) 5 MG tablet Take 1 tablet (5 mg total) by mouth daily. 30 tablet 11  . aspirin EC 81 MG tablet Take 81 mg by mouth daily.    . budesonide (ENTOCORT EC) 3 MG 24 hr capsule Take 3 capsules (9 mg total) by mouth daily. 90 capsule 0  . FLUoxetine (PROZAC) 40 MG capsule TAKE ONE CAPSULE BY MOUTH EVERY DAY 90 capsule 1  . furosemide (LASIX) 20 MG tablet Take 20 mg by mouth daily. Can take an extra 20 mg daily as needed for shortness of breath or swelling 45 tablet 11  . irbesartan (AVAPRO) 300 MG tablet Take 1 tablet (300 mg total) by mouth daily. 30 tablet 6  . levothyroxine (SYNTHROID, LEVOTHROID) 50 MCG tablet Take 50 mcg by mouth daily before breakfast.    . loperamide (IMODIUM) 2 MG capsule Take by mouth as needed for diarrhea or loose stools (Take as directed). Reported on  05/01/2015    . Melatonin 1 MG CAPS Take 1 tablet by mouth at bedtime.    . Nebivolol HCl (BYSTOLIC) 20 MG TABS Take 1 tablet (20 mg total) by mouth daily. 30 tablet 11  . nitroGLYCERIN (NITROSTAT) 0.4 MG SL tablet Place 0.4 mg under the tongue every 5 (five) minutes as needed for chest pain (MAX 3 TABLETS).    . pantoprazole (PROTONIX) 40 MG tablet Take 1 tablet (40 mg total) by mouth daily. (Patient taking differently: Take 40 mg by mouth 2 (two) times daily before a meal. ) 30 tablet 11  . simvastatin (ZOCOR) 40 MG tablet Take 1 tablet (40 mg total) by mouth at bedtime. 90 tablet 2   No current facility-administered medications for this visit.     Allergies as of 09/01/2016 - Review Complete 09/01/2016  Allergen Reaction Noted  . Dipyridamole Hives 01/16/2015  . Varenicline Hives 01/16/2015  . Chantix [varenicline tartrate]  03/08/2011  . Dipyridamole  08/10/2010    ROS:  General: Negative for anorexia, weight loss, fever, chills, fatigue, weakness. ENT: Negative for hoarseness, difficulty swallowing , nasal congestion. CV: Negative for chest pain, angina, palpitations, dyspnea on exertion, peripheral edema.  Respiratory: Negative for dyspnea at rest, dyspnea on exertion, cough, sputum, wheezing.  GI: See history of present illness. GU:  Negative for dysuria, hematuria, urinary incontinence, urinary frequency, nocturnal urination.  Endo: Negative for unusual  weight change.    Physical Examination:   BP 97/65   Pulse 67   Temp 98.3 F (36.8 C) (Oral)   Ht 5\' 6"  (1.676 m)   Wt 210 lb 3.2 oz (95.3 kg)   BMI 33.93 kg/m   General: Well-nourished, well-developed in no acute distress.  Eyes: No icterus. Mouth: Oropharyngeal mucosa moist and pink , no lesions erythema or exudate. Lungs: Clear to auscultation bilaterally.  Heart: Regular rate and rhythm, no murmurs rubs or gallops.  Abdomen: Bowel sounds are normal, nontender, nondistended, no hepatosplenomegaly or masses, no  abdominal bruits or hernia , no rebound or guarding.   Extremities: No lower extremity edema. No clubbing or deformities. Neuro: Alert and oriented x 4   Skin: Warm and dry, no jaundice.   Psych: Alert and cooperative, normal mood and affect.  Labs:  Lab Results  Component Value Date   CREATININE 1.00 08/26/2016   BUN 20 08/26/2016   NA 141 08/26/2016   K 3.8 08/26/2016   CL 102 08/26/2016   CO2 24 08/26/2016   Lab Results  Component Value Date   WBC 7.2 07/07/2016   HGB 14.2 07/07/2016   HCT 42.2 07/07/2016   MCV 97 07/07/2016   PLT 195 07/07/2016   Lab Results  Component Value Date   TSH 3.180 07/07/2016   Lab Results  Component Value Date   ALT 13 07/07/2016   AST 18 07/07/2016   ALKPHOS 49 07/07/2016   BILITOT 0.9 07/07/2016    Imaging Studies: No results found.

## 2016-09-02 ENCOUNTER — Other Ambulatory Visit: Payer: Self-pay | Admitting: Cardiology

## 2016-09-04 ENCOUNTER — Encounter: Payer: Self-pay | Admitting: Gastroenterology

## 2016-09-04 NOTE — Assessment & Plan Note (Signed)
Heartburn, vomiting likely reflux related. Worse with certain foods/dietary indiscretions. gerd food list provided. Reinforced reflux measures. Continue ppi bid. Return to office in six weeks.

## 2016-09-04 NOTE — Assessment & Plan Note (Signed)
Diarrhea resolved on Entocort 9mg  daily but also using imodium 4mg  every morning. Continues to have pp abd cramps somewhat alarming, not typical feature of lymphocytic colitis. Begin slow entocort taper in two weeks. She will call if recurrent diarrhea. If persistent pain, would consider further work up. Two discuss with Dr. Gala Romney as well.

## 2016-09-05 NOTE — Progress Notes (Signed)
CC'ED TO PCP 

## 2016-09-05 NOTE — Telephone Encounter (Signed)
REFILL 

## 2016-09-09 ENCOUNTER — Other Ambulatory Visit: Payer: Self-pay | Admitting: Internal Medicine

## 2016-09-11 NOTE — Progress Notes (Signed)
[name] Date of Birth: [birth year]   History of Present Illness: [name] is seen today for follow up CAD and PAD. She has known CAD with redo CABG in 2001.  She has a known abdominal aortic aneurysm with last ultrasound  showing a  3.1 cm aneurysm in August 2018. She has a history of PAD with right iliac occlusion. Last ABIs in August 2018 showed stable right ABI of 0.75.  She has chronic claudication symptoms with pain in right thigh with walking for long distances. This has not changed.[name] Her last nuclear stress test in January 2016 showed  anterior wall ischemia. This was unchanged from 2012 and 2013. She had a cardiac cath in 2012 which  showed patent grafts.   She has  lymphocytic colitis. Followed by Dr. Gala Romney. This flared this Spring and she was placed on Entecort. Following this she developed edema and elevated BP. She states she felt dizzy, HA, and "crazy". She was started on Avapro. On last visit added lasix.   On follow up today she reports she is doing better. Just reduced her steroids to 2 tablets per day. Edema has resolved. HA is gone. BP is doing much better but still running a little high in the am. Denies any SOB.  No chest pain.  No dizziness or palpitations. Has not smoked since February.   Current Outpatient Prescriptions on File Prior to Visit  Medication Sig Dispense Refill  . acetaminophen (TYLENOL) 500 MG tablet Take 1,000 mg by mouth every 6 (six) hours as needed (pain).     [name] amLODipine (NORVASC) 5 MG tablet Take 1 tablet (5 mg total) by mouth daily. 30 tablet 11  . aspirin EC 81 MG tablet Take 81 mg by mouth daily.    . budesonide (ENTOCORT EC) 3 MG 24 hr capsule Take 9mg  po daily for 2 weeks, 6mg  po daily for 4 weeks, then 3mg  daily. 100 capsule 0  . FLUoxetine (PROZAC) 40 MG capsule TAKE ONE CAPSULE BY MOUTH EVERY DAY 90 capsule 1  . furosemide (LASIX) 20 MG tablet TAKE 1 TABLET BY MOUTH EVERY DAY AS NEEDED FOR EDEMA 30 tablet 11  . irbesartan (AVAPRO) 300 MG  tablet Take 1 tablet (300 mg total) by mouth daily. 30 tablet 6  . levothyroxine (SYNTHROID, LEVOTHROID) 50 MCG tablet Take 50 mcg by mouth daily before breakfast.    . loperamide (IMODIUM) 2 MG capsule Take by mouth as needed for diarrhea or loose stools (Take as directed). Reported on 05/01/2015    . Melatonin 1 MG CAPS Take 1 tablet by mouth at bedtime.    . Nebivolol HCl (BYSTOLIC) 20 MG TABS Take 1 tablet (20 mg total) by mouth daily. 30 tablet 11  . nitroGLYCERIN (NITROSTAT) 0.4 MG SL tablet Place 0.4 mg under the tongue every 5 (five) minutes as needed for chest pain (MAX 3 TABLETS).    . pantoprazole (PROTONIX) 40 MG tablet Take 1 tablet (40 mg total) by mouth daily. (Patient taking differently: Take 40 mg by mouth 2 (two) times daily before a meal. ) 30 tablet 11  . simvastatin (ZOCOR) 40 MG tablet Take 1 tablet (40 mg total) by mouth at bedtime. 90 tablet 2   No current facility-administered medications on file prior to visit.     Allergies  Allergen Reactions  . Dipyridamole Hives  . Varenicline Hives  . Chantix [Varenicline Tartrate]     unknown  . Dipyridamole     unknown  Past Medical History:  Diagnosis Date  . Abdominal aortic aneurysm ([place]) 2/12   infrarenal diagnosed with maximum measurement at 3.3 cm  . Depression   . Diverticulosis   . GERD (gastroesophageal reflux disease)   . History of tobacco abuse    Recurrent cigarette smoking  . Hyperlipidemia   . Hyperplastic colon polyp   . Hypertension   . Hypothyroidism   . Ischemic heart disease    had redo bypass surgery in 2001, at that time she had a free right internal mammary to the LAD, LAD endarterectomy, sequential vein graft to the OM1 and OM2 -- last catheterization was in 2008  . Leg fracture    remote right leg fracture  . Microscopic colitis   . Obesity    with gastric bypass surgery in 2004  . PAD (peripheral artery disease) ([place]) 04/13/2011   right iliac occlusion  . PONV (postoperative nausea  and vomiting)     Past Surgical History:  Procedure Laterality Date  . CARDIAC CATHETERIZATION  02/16/1999   Left heart catheterization with selective coronary  angiography, left ventricular angiography (RAO and LAO views), angiography of left internal mammary artery, and angiography of saphenous vein grafts  . CARDIAC CATHETERIZATION  10/20/2006   Est. EF of 55% -- Totally occuladed native coronary circulation -- Persistent patency of the right mammary graft and the distal LAD artery with peristent patency of the left mammary graft to the proximal LAD artery with persistant patency of the saphenous vein graft to the sequentail branches of the OM        . CARPAL TUNNEL RELEASE    . CHOLECYSTECTOMY    . COLONOSCOPY  07/12/2007   Dr.Brodie- normal cecum and rectum, diverticulosis in the sigmoid colon  . COLONOSCOPY  1991   per Dr.Brodie office visit note= hyperplastic polyp  . COLONOSCOPY N/A 09/30/2014   Dr.Rourk- noraml appearing rectal mucosa, scattered L sided diverticula, colonic mucosa has a somewhat pale friable appearence diffusely, no ulcers or erosions seen, the distal 10cm of terminal ileum appeared entirely normal. bx=benign colorectal mucosa with lymphocytic colitis  . CORONARY ARTERY BYPASS GRAFT  1985  . CORONARY ARTERY BYPASS GRAFT  03/09/1999   Redo CABG x3 --  with the free right internal mammary artey to the LAD with an LAD endarterectomy -- Sequential saphenous vein graft to OM1 and OM2                   . ESOPHAGOGASTRODUODENOSCOPY N/A 09/30/2014   Dr.Rourk-mild erosive reflux esophagitis s/p bariatric surgery  . GASTRIC BYPASS  2004    History  Smoking Status  . Former Smoker  . Packs/day: 0.50  . Types: Cigarettes  . Start date: 01/10/1985  . Quit date: 02/11/2016  Smokeless Tobacco  . Never Used    History  Alcohol Use No    Family History  Problem Relation Age of Onset  . Stroke Father   . Diabetes Mother   . Pancreatic cancer Mother     Review of  Systems: The review of systems is as above.  All other systems were reviewed and are negative.  Physical Exam: BP (!) 151/83   Pulse 66   Ht 5\' 6"  (1.676 m)   Wt 214 lb (97.1 kg)   SpO2 96%   BMI 34.54 kg/m  Patient is an obese WF in no acute distress.  HEENT is unremarkable. Normocephalic/atraumatic. PERRL. Sclera are nonicteric. Neck is supple. No masses. Bilateral bruits. No JVD. Lungs are clear.  Cardiac exam shows a regular rate and rhythm. She has a 2/6 harsh outflow murmur radiating to the carotids. Abdomen is obese but soft. Extremities are without edema. Pedal pulses are nonpalpable. Feet are warm and dry. Gait and ROM are intact. No gross neurologic deficits noted.   LABORATORY DATA: Lab Results  Component Value Date   CHOL 149 02/04/2014   HDL 60 02/04/2014   LDLCALC 64 02/04/2014   TRIG 124 02/04/2014   CHOLHDL 2.5 02/04/2014   Lab Results  Component Value Date   WBC 7.2 07/07/2016   HGB 14.2 07/07/2016   HCT 42.2 07/07/2016   PLT 195 07/07/2016   GLUCOSE 94 08/26/2016   CHOL 149 02/04/2014   TRIG 124 02/04/2014   HDL 60 02/04/2014   LDLCALC 64 02/04/2014   ALT 13 07/07/2016   AST 18 07/07/2016   NA 141 08/26/2016   K 3.8 08/26/2016   CL 102 08/26/2016   CREATININE 1.00 08/26/2016   BUN 20 08/26/2016   CO2 24 08/26/2016   TSH 3.180 07/07/2016   INR 1.0 10/16/2006   Labs dated 10/05/15: glucose 108. Otherwise CMET is normal. Cholesterol 130, triglycerides 114, HDL 63, LDL 44. TSH normal.   Echo 04/19/16: Study Conclusions  - Left ventricle: The cavity size was normal. Wall thickness was   normal. Systolic function was normal. The estimated ejection   fraction was in the range of 60% to 65%. Wall motion was normal;   there were no regional wall motion abnormalities. Doppler   parameters are consistent with pseudonormal left ventricular   relaxation (grade 2 diastolic dysfunction). The E/e&' ratio is   >15, suggesting elevated LV filling pressure. -  Aortic valve: Calcified leaflets. Moderate stenosis. Mean   gradient (S): 35 mm Hg. Peak gradient (S): 61 mm Hg. Valve area   (VTI): 0.98 cm^2. Valve area (Vmax): 1.05 cm^2. Valve area   (Vmean): 0.98 cm^2. - Mitral valve: Mildly thickened leaflets . There was mild   regurgitation. - Left atrium: The atrium was moderately dilated. - Right ventricle: The cavity size was mildly dilated. Systolic   function is mildly reduced. - Tricuspid valve: There was mild regurgitation. - Pulmonary arteries: PA peak pressure: 34 mm Hg (S). - Inferior vena cava: The vessel was normal in size. The   respirophasic diameter changes were in the normal range (>= 50%),   consistent with normal central venous pressure.  Impressions:  - Compared to a prior study in 2012, there is now moderate aortic   stenosis - AVA around 1.05 cm2 with mean gradient of 35 mmHg.  Assessment / Plan: 1. Coronary disease status post CABG. Redo coronary bypass surgery in 2001 with sequential saphenous vein graft to the first and second obtuse marginal vessels and a free RIMA graft to the LAD. Prior LIMA graft to the proximal LAD. Myoview study January 2016 was stable showing anterior ischemia similar to prior studies.  Cardiac cath in 2008 showed patent grafts. Asymptomatic. Continue aspirin, beta blocker, and statin therapy.   2. PAD with right iliac occlusion. Stable claudication. Dopplers in August 2018 showed stable ABI of 0.75 on the right and normal on the left. No change from prior.  3. Abdominal aortic aneurysm measuring 3.1 cm. Stable August 2018.   4. Hypertension- recent elevation related to steroids. Improved with medication change. I would anticipate BP will improve further as steroids tapered. Continue current Rx.   5. Tobacco abuse-congratulated on smoking cessation.  6. Obesity.  7. Lymphocytic colitis. Per  Dr. Gala Romney.  8. Aortic stenosis- mild per Echo in 2012. Most recent Echo showed progression to moderate  in April 2018. Asymptomatic.  9. Carotid arterial disease- moderate.   I will follow up in 6 months.

## 2016-09-15 ENCOUNTER — Ambulatory Visit (INDEPENDENT_AMBULATORY_CARE_PROVIDER_SITE_OTHER): Payer: Medicare Other | Admitting: Cardiology

## 2016-09-15 ENCOUNTER — Encounter: Payer: Self-pay | Admitting: Cardiology

## 2016-09-15 VITALS — BP 151/83 | HR 66 | Ht 66.0 in | Wt 214.0 lb

## 2016-09-15 DIAGNOSIS — I10 Essential (primary) hypertension: Secondary | ICD-10-CM

## 2016-09-15 DIAGNOSIS — I350 Nonrheumatic aortic (valve) stenosis: Secondary | ICD-10-CM | POA: Diagnosis not present

## 2016-09-15 DIAGNOSIS — I714 Abdominal aortic aneurysm, without rupture, unspecified: Secondary | ICD-10-CM

## 2016-09-15 DIAGNOSIS — I779 Disorder of arteries and arterioles, unspecified: Secondary | ICD-10-CM

## 2016-09-15 DIAGNOSIS — I739 Peripheral vascular disease, unspecified: Secondary | ICD-10-CM

## 2016-09-15 DIAGNOSIS — I25810 Atherosclerosis of coronary artery bypass graft(s) without angina pectoris: Secondary | ICD-10-CM

## 2016-09-15 NOTE — Patient Instructions (Addendum)
Continue your current therapy  I will see you in 6 months.   

## 2016-09-20 NOTE — Progress Notes (Signed)
Please find out how patient is doing regarding diarrhea and her reflux/vomiting. She should have started Entocort taper or at least getting ready to.   Let her know that I spoke with Dr. Gala Romney, if her diarrhea persists, we may have to consider adding Imuran for lymphocytic colitis. She may need EGD if persistent reflux/vomiting/abd cramping.

## 2016-09-22 NOTE — Progress Notes (Signed)
Tried to call pt- NA- LMOM 

## 2016-09-22 NOTE — Progress Notes (Signed)
I have also sent her a message in [place].

## 2016-10-19 ENCOUNTER — Encounter: Payer: Self-pay | Admitting: Gastroenterology

## 2016-10-19 ENCOUNTER — Ambulatory Visit (INDEPENDENT_AMBULATORY_CARE_PROVIDER_SITE_OTHER): Payer: Medicare Other | Admitting: Gastroenterology

## 2016-10-19 VITALS — BP 105/70 | HR 64 | Temp 97.7°F | Ht 66.0 in | Wt 212.8 lb

## 2016-10-19 DIAGNOSIS — K219 Gastro-esophageal reflux disease without esophagitis: Secondary | ICD-10-CM | POA: Insufficient documentation

## 2016-10-19 DIAGNOSIS — K52832 Lymphocytic colitis: Secondary | ICD-10-CM

## 2016-10-19 NOTE — Assessment & Plan Note (Signed)
Doing very well with antireflux measures. Symptoms well managed on current regimen. Discussed if she has refractory vomiting and heartburn again we may need to consider updating her endoscopy. She voiced understanding.

## 2016-10-19 NOTE — Progress Notes (Signed)
cc'ed to pcp °

## 2016-10-19 NOTE — Assessment & Plan Note (Signed)
Currently diarrhea has resolved. She is down to 6 mg daily of Entocort. She will continue 6 mg daily for the next 2 weeks and then go down to 3 mg daily. She will call in 4 weeks with a progress report and we will discuss further taper at that time. At any time if she has recurrent diarrhea or other symptoms such as abdominal pain, vomiting she'll let me know.

## 2016-10-19 NOTE — Patient Instructions (Signed)
1. Continue Budesonide (Entocort) two tablets daily for next two weeks. If still doing well, then drop back to one tablet daily.  2. Call me in four weeks and we will give you further taper instructions at that time based on how you are doing.

## 2016-10-19 NOTE — Progress Notes (Signed)
Primary Care Physician: [name], MD  Primary Gastroenterologist:  Garfield Cornea, MD   Chief Complaint  Patient presents with  . Follow-up    GERD- doing okay, loose stools 1 week ago    HPI: [name] is a 76 y.o. female here for follow-up of lymphocytic colitis. She had a flare that began back in April. Was started on Entocort 6 mg daily empirically. She is on that dose for over 4 weeks and decreased and 3 mg daily but diarrhea had not resolved. GI pathogen panel was negative as well as C. difficile. On July 19 she was encouraged to increase Entocort 9 mg daily. She developed leg swelling and increased blood pressure and had started new blood pressure medications. She thought it was related to the medication. Due to this she tapered herself down to 3 mg daily. Only having rare loose stools but was having significant reflux and vomiting. Heartburn not well controlled on twice a day PPI. Also with terrible cramps after meals. Levsin not helping. Once again we increased her Entocort to 9 mg daily for 2 weeks then down to 6 mg daily where she has been for about 4 weeks.  Clinically she feels well. Her diarrhea has resolved. She had one loose stool last week but she felt was food related. She significantly changed her diet, avoiding greasy/fried foods and not overeating. Her reflux symptoms are well managed female. No further vomiting. Her swelling has resolved and actually her weight is down about 10 pounds since August.  Current Outpatient Prescriptions  Medication Sig Dispense Refill  . acetaminophen (TYLENOL) 500 MG tablet Take 1,000 mg by mouth every 6 (six) hours as needed (pain).     [name] amLODipine (NORVASC) 5 MG tablet Take 1 tablet (5 mg total) by mouth daily. 30 tablet 11  . aspirin EC 81 MG tablet Take 81 mg by mouth daily.    . budesonide (ENTOCORT EC) 3 MG 24 hr capsule Take 9mg  po daily for 2 weeks, 6mg  po daily for 4 weeks, then 3mg  daily. (Patient taking  differently: Take 6 mg by mouth daily. Take 9mg  po daily for 2 weeks, 6mg  po daily for 4 weeks, then 3mg  daily.) 100 capsule 0  . FLUoxetine (PROZAC) 40 MG capsule TAKE ONE CAPSULE BY MOUTH EVERY DAY 90 capsule 1  . furosemide (LASIX) 20 MG tablet TAKE 1 TABLET BY MOUTH EVERY DAY AS NEEDED FOR EDEMA 30 tablet 11  . irbesartan (AVAPRO) 300 MG tablet Take 1 tablet (300 mg total) by mouth daily. 30 tablet 6  . levothyroxine (SYNTHROID, LEVOTHROID) 50 MCG tablet Take 50 mcg by mouth daily before breakfast.    . loperamide (IMODIUM) 2 MG capsule Take by mouth as needed for diarrhea or loose stools (Take as directed). Reported on 05/01/2015    . Melatonin 1 MG CAPS Take 1 tablet by mouth at bedtime.    . Nebivolol HCl (BYSTOLIC) 20 MG TABS Take 1 tablet (20 mg total) by mouth daily. 30 tablet 11  . nitroGLYCERIN (NITROSTAT) 0.4 MG SL tablet Place 0.4 mg under the tongue every 5 (five) minutes as needed for chest pain (MAX 3 TABLETS).    . pantoprazole (PROTONIX) 40 MG tablet Take 1 tablet (40 mg total) by mouth daily. (Patient taking differently: Take 40 mg by mouth 2 (two) times daily before a meal. ) 30 tablet 11  . simvastatin (ZOCOR) 40 MG tablet Take 1 tablet (40 mg total) by mouth at  bedtime. 90 tablet 2   No current facility-administered medications for this visit.     Allergies as of 10/19/2016 - Review Complete 10/19/2016  Allergen Reaction Noted  . Dipyridamole Hives 01/16/2015  . Varenicline Hives 01/16/2015  . Chantix [varenicline tartrate]  03/08/2011  . Dipyridamole  08/10/2010    ROS:  General: Negative for anorexia, weight loss, fever, chills, fatigue, weakness. ENT: Negative for hoarseness, difficulty swallowing , nasal congestion. CV: Negative for chest pain, angina, palpitations, dyspnea on exertion, peripheral edema.  Respiratory: Negative for dyspnea at rest, dyspnea on exertion, cough, sputum, wheezing.  GI: See history of present illness. GU:  Negative for dysuria,  hematuria, urinary incontinence, urinary frequency, nocturnal urination.  Endo: Negative for unusual weight change.    Physical Examination:   BP 105/70   Pulse 64   Temp 97.7 F (36.5 C) (Oral)   Ht 5\' 6"  (1.676 m)   Wt 212 lb 12.8 oz (96.5 kg)   BMI 34.35 kg/m   General: Well-nourished, well-developed in no acute distress.  Eyes: No icterus. Mouth: Oropharyngeal mucosa moist and pink , no lesions erythema or exudate. Lungs: Clear to auscultation bilaterally.  Heart: Regular rate and rhythm, no murmurs rubs or gallops.  Abdomen: Bowel sounds are normal, nontender, nondistended, no hepatosplenomegaly or masses, no abdominal bruits or hernia , no rebound or guarding.   Extremities: No lower extremity edema. No clubbing or deformities. Neuro: Alert and oriented x 4   Skin: Warm and dry, no jaundice.   Psych: Alert and cooperative, normal mood and affect.

## 2016-11-03 ENCOUNTER — Other Ambulatory Visit: Payer: Self-pay | Admitting: *Deleted

## 2016-11-03 DIAGNOSIS — I6523 Occlusion and stenosis of bilateral carotid arteries: Secondary | ICD-10-CM

## 2016-12-26 ENCOUNTER — Other Ambulatory Visit: Payer: Self-pay

## 2016-12-26 MED ORDER — NEBIVOLOL HCL 20 MG PO TABS: 20.0000 mg | ORAL_TABLET | Freq: Every day | ORAL | 2 refills | Status: DC

## 2016-12-26 NOTE — Telephone Encounter (Signed)
Rx(s) sent to pharmacy electronically.  

## 2016-12-27 ENCOUNTER — Telehealth: Payer: Self-pay | Admitting: Cardiology

## 2016-12-27 DIAGNOSIS — I10 Essential (primary) hypertension: Secondary | ICD-10-CM

## 2016-12-27 NOTE — Telephone Encounter (Signed)
Please call,having problem with her blood pressure .It is up some.

## 2016-12-28 MED ORDER — AMLODIPINE BESYLATE 10 MG PO TABS: 10.0000 mg | ORAL_TABLET | Freq: Every day | ORAL | 6 refills | Status: DC

## 2016-12-28 MED ORDER — CHLORTHALIDONE 25 MG PO TABS: ORAL_TABLET | ORAL | 6 refills | Status: DC

## 2016-12-28 NOTE — Telephone Encounter (Signed)
I would add chlorthalidone 12.5 mg daily to what she is taking. Would check a BMET in one week.  Peter Martinique MD, [facility]

## 2016-12-28 NOTE — Telephone Encounter (Signed)
F/U Call: Patient calling, states that she would like to discuss her BP medication.

## 2016-12-28 NOTE — Telephone Encounter (Signed)
Returned call to patient.She stated her B/P has been elevated when she gets up in mornings.Stated 1 week ago she increased Amlodipine to 10 mg every night.B/P has been 897 to 915 [id number],37'R diastolic.Pulse 70's.Stated since she increased Amlodipine B/P still elevated 145/80,158/74,148/77.No swelling noticed in lower legs and feet.Stated she is going to [place] day after Christmas and she would like her B/P to be better.Advised I will send message to [place] for advice.

## 2016-12-28 NOTE — Telephone Encounter (Signed)
Returned call to patient Dr.Jordan's recommendations given.Advised to have bmet in 1 week.Advised to continue to monitor B/P and call back if continues to be elevated.

## 2017-01-11 ENCOUNTER — Other Ambulatory Visit: Payer: Medicare Other | Admitting: *Deleted

## 2017-01-11 LAB — BASIC METABOLIC PANEL
BUN/Creatinine Ratio: 16 (ref 12–28)
BUN: 16 mg/dL (ref 8–27)
CALCIUM: 9.5 mg/dL (ref 8.7–10.3)
CO2: 24 mmol/L (ref 20–29)
Chloride: 104 mmol/L (ref 96–106)
Creatinine, Ser: 0.98 mg/dL (ref 0.57–1.00)
GFR, EST AFRICAN AMERICAN: 66 mL/min/{1.73_m2} (ref >59)
GFR, EST NON AFRICAN AMERICAN: 57 mL/min/{1.73_m2} — AB (ref >59)
Glucose: 99 mg/dL (ref 65–99)
Potassium: 4.8 mmol/L (ref 3.5–5.2)
Sodium: 142 mmol/L (ref 134–144)

## 2017-01-16 ENCOUNTER — Telehealth: Payer: Self-pay | Admitting: Internal Medicine

## 2017-01-16 NOTE — Telephone Encounter (Signed)
Pt said that she was suppose to call and let LSL know how she was doing coming off her steroids. Patient said she was doing fine and to let LSL know.

## 2017-01-16 NOTE — Telephone Encounter (Signed)
Spoke with pt and followed the instructions of decreasing the Entocort. Pt is now completely off of medication.

## 2017-01-17 NOTE — Telephone Encounter (Signed)
Noted  

## 2017-01-17 NOTE — Telephone Encounter (Signed)
Noted. She can call if any further problems.

## 2017-02-06 ENCOUNTER — Encounter: Payer: Self-pay | Admitting: Internal Medicine

## 2017-02-25 ENCOUNTER — Other Ambulatory Visit: Payer: Self-pay | Admitting: Cardiology

## 2017-03-06 ENCOUNTER — Other Ambulatory Visit: Payer: Self-pay | Admitting: Cardiology

## 2017-03-18 NOTE — Progress Notes (Signed)
[name] Date of Birth: 1940-11-08   History of Present Illness: [name] is seen today for follow up CAD, AS, and PAD. She has known CAD with redo CABG in 2001.  She has a known abdominal aortic aneurysm with last ultrasound  showing a  3.1 cm aneurysm in August 2018. She has a history of PAD with right iliac occlusion. Last ABIs in August 2018 showed stable right ABI of 0.75.  She has chronic claudication symptoms with pain in right thigh with walking for long distances. This has not changed.[name] Her last nuclear stress test in January 2016 showed  anterior wall ischemia. This was unchanged from 2012 and 2013. She had a cardiac cath in 2008 which  showed patent grafts.   She has  lymphocytic colitis. Followed by Dr. Gala Romney. This flared last Spring and she was placed on Entecort. Following this she developed edema and elevated BP. She states she felt dizzy, HA, and "crazy". She was started on Avapro. Symptoms resolved after she came off steroids.   On follow up today she reports she is doing pretty well. No edema and she doesn't use lasix.  BP is under good control.  Denies any  chest pain.  She does note SOB with exertion. Not really exercising much now. Smokes an occasional cigarette. No dizziness or palpitations.   Current Outpatient Medications on File Prior to Visit  Medication Sig Dispense Refill  . acetaminophen (TYLENOL) 500 MG tablet Take 1,000 mg by mouth every 6 (six) hours as needed (pain).     [name] amLODipine (NORVASC) 10 MG tablet Take 1 tablet (10 mg total) by mouth daily. 30 tablet 6  . aspirin EC 81 MG tablet Take 81 mg by mouth daily.    . chlorthalidone (HYGROTON) 25 MG tablet Take 1/2 tablet ( 12.5 mg ) daily 30 tablet 6  . FLUoxetine (PROZAC) 40 MG capsule TAKE ONE CAPSULE BY MOUTH EVERY DAY 90 capsule 1  . furosemide (LASIX) 20 MG tablet TAKE 1 TABLET BY MOUTH EVERY DAY AS NEEDED FOR EDEMA 30 tablet 11  . irbesartan (AVAPRO) 300 MG tablet TAKE 1 TABLET BY MOUTH EVERY DAY 30  tablet 4  . levothyroxine (SYNTHROID, LEVOTHROID) 50 MCG tablet TAKE 1 TABLET BY MOUTH EVERY DAY BEFORE BREAKFAST TAKE AN EXTRA 1/2 TAB ONE TIME EACH WEEK 96 tablet 4  . loperamide (IMODIUM) 2 MG capsule Take by mouth as needed for diarrhea or loose stools (Take as directed). Reported on 05/01/2015    . Melatonin 1 MG CAPS Take 1 tablet by mouth at bedtime.    . Nebivolol HCl (BYSTOLIC) 20 MG TABS Take 1 tablet (20 mg total) by mouth daily. 90 tablet 2  . nitroGLYCERIN (NITROSTAT) 0.4 MG SL tablet Place 0.4 mg under the tongue every 5 (five) minutes as needed for chest pain (MAX 3 TABLETS).    . pantoprazole (PROTONIX) 40 MG tablet Take 1 tablet (40 mg total) by mouth daily. (Patient taking differently: Take 40 mg by mouth 2 (two) times daily before a meal. ) 30 tablet 11  . simvastatin (ZOCOR) 40 MG tablet TAKE 1 TABLET OP AT BEDTIME 90 tablet 2   No current facility-administered medications on file prior to visit.     Allergies  Allergen Reactions  . Dipyridamole Hives  . Varenicline Hives  . Chantix [Varenicline Tartrate]     unknown  . Dipyridamole     unknown    Past Medical History:  Diagnosis Date  . Abdominal  aortic aneurysm ([place]) 2/12   infrarenal diagnosed with maximum measurement at 3.3 cm  . Depression   . Diverticulosis   . GERD (gastroesophageal reflux disease)   . History of tobacco abuse    Recurrent cigarette smoking  . Hyperlipidemia   . Hyperplastic colon polyp   . Hypertension   . Hypothyroidism   . Ischemic heart disease    had redo bypass surgery in 2001, at that time she had a free right internal mammary to the LAD, LAD endarterectomy, sequential vein graft to the OM1 and OM2 -- last catheterization was in 2008  . Leg fracture    remote right leg fracture  . Microscopic colitis   . Obesity    with gastric bypass surgery in 2004  . PAD (peripheral artery disease) ([place]) 04/13/2011   right iliac occlusion  . PONV (postoperative nausea and vomiting)      Past Surgical History:  Procedure Laterality Date  . CARDIAC CATHETERIZATION  02/16/1999   Left heart catheterization with selective coronary  angiography, left ventricular angiography (RAO and LAO views), angiography of left internal mammary artery, and angiography of saphenous vein grafts  . CARDIAC CATHETERIZATION  10/20/2006   Est. EF of 55% -- Totally occuladed native coronary circulation -- Persistent patency of the right mammary graft and the distal LAD artery with peristent patency of the left mammary graft to the proximal LAD artery with persistant patency of the saphenous vein graft to the sequentail branches of the OM        . CARPAL TUNNEL RELEASE    . CHOLECYSTECTOMY    . COLONOSCOPY  07/12/2007   Dr.Brodie- normal cecum and rectum, diverticulosis in the sigmoid colon  . COLONOSCOPY  1991   per Dr.Brodie office visit note= hyperplastic polyp  . COLONOSCOPY N/A 09/30/2014   Dr.Rourk- noraml appearing rectal mucosa, scattered L sided diverticula, colonic mucosa has a somewhat pale friable appearence diffusely, no ulcers or erosions seen, the distal 10cm of terminal ileum appeared entirely normal. bx=benign colorectal mucosa with lymphocytic colitis  . CORONARY ARTERY BYPASS GRAFT  1985  . CORONARY ARTERY BYPASS GRAFT  03/09/1999   Redo CABG x3 --  with the free right internal mammary artey to the LAD with an LAD endarterectomy -- Sequential saphenous vein graft to OM1 and OM2                   . ESOPHAGOGASTRODUODENOSCOPY N/A 09/30/2014   Dr.Rourk-mild erosive reflux esophagitis s/p bariatric surgery  . GASTRIC BYPASS  2004    Social History   Tobacco Use  Smoking Status Former Smoker  . Packs/day: 0.50  . Types: Cigarettes  . Start date: 01/10/1985  . Last attempt to quit: 02/11/2016  . Years since quitting: 1.1  Smokeless Tobacco Never Used    Social History   Substance and Sexual Activity  Alcohol Use No  . Alcohol/week: 0.0 oz    Family History  Problem Relation  Age of Onset  . Stroke Father   . Diabetes Mother   . Pancreatic cancer Mother     Review of Systems: The review of systems is as above.  All other systems were reviewed and are negative.  Physical Exam: BP (!) 116/54   Pulse 66   Ht 5\' 6"  (1.676 m)   Wt 208 lb (94.3 kg)   BMI 33.57 kg/m  GENERAL:  Obese WF in NAD HEENT:  PERRL, EOMI, sclera are clear. Oropharynx is clear. NECK:  No jugular venous distention,  carotid upstroke brisk and symmetric, bilateral  bruits, no thyromegaly or adenopathy LUNGS:  Clear to auscultation bilaterally CHEST:  Unremarkable HEART:  RRR,  PMI not displaced or sustained,S1 and S2 within normal limits, no S3, no S4: no clicks, no rubs, harsh gr 1-6/1 systolic murmur RUSB radiating to LSB ABD:  Soft, nontender. BS +, no masses or bruits. No hepatomegaly, no splenomegaly EXT: pulses in feet not palpable, feet warm and dry. no edema, no cyanosis no clubbing SKIN:  Warm and dry.  No rashes NEURO:  Alert and oriented x 3. Cranial nerves II through XII intact. PSYCH:  Cognitively intact     LABORATORY DATA: Lab Results  Component Value Date   CHOL 149 02/04/2014   HDL 60 02/04/2014   LDLCALC 64 02/04/2014   TRIG 124 02/04/2014   CHOLHDL 2.5 02/04/2014   Lab Results  Component Value Date   WBC 7.2 07/07/2016   HGB 14.2 07/07/2016   HCT 42.2 07/07/2016   PLT 195 07/07/2016   GLUCOSE 99 01/11/2017   CHOL 149 02/04/2014   TRIG 124 02/04/2014   HDL 60 02/04/2014   LDLCALC 64 02/04/2014   ALT 13 07/07/2016   AST 18 07/07/2016   NA 142 01/11/2017   K 4.8 01/11/2017   CL 104 01/11/2017   CREATININE 0.98 01/11/2017   BUN 16 01/11/2017   CO2 24 01/11/2017   TSH 3.180 07/07/2016   INR 1.0 10/16/2006   Labs dated 10/05/15: glucose 108. Otherwise CMET is normal. Cholesterol 130, triglycerides 114, HDL 63, LDL 44. TSH normal.  Dated 11/02/16: cholesterol 138, triglycerides 78, LDL 49, HDL 73. CMET and CBC normal.  Echo 04/19/16: Study  Conclusions  - Left ventricle: The cavity size was normal. Wall thickness was   normal. Systolic function was normal. The estimated ejection   fraction was in the range of 60% to 65%. Wall motion was normal;   there were no regional wall motion abnormalities. Doppler   parameters are consistent with pseudonormal left ventricular   relaxation (grade 2 diastolic dysfunction). The E/e&' ratio is   >15, suggesting elevated LV filling pressure. - Aortic valve: Calcified leaflets. Moderate stenosis. Mean   gradient (S): 35 mm Hg. Peak gradient (S): 61 mm Hg. Valve area   (VTI): 0.98 cm^2. Valve area (Vmax): 1.05 cm^2. Valve area   (Vmean): 0.98 cm^2. - Mitral valve: Mildly thickened leaflets . There was mild   regurgitation. - Left atrium: The atrium was moderately dilated. - Right ventricle: The cavity size was mildly dilated. Systolic   function is mildly reduced. - Tricuspid valve: There was mild regurgitation. - Pulmonary arteries: PA peak pressure: 34 mm Hg (S). - Inferior vena cava: The vessel was normal in size. The   respirophasic diameter changes were in the normal range (>= 50%),   consistent with normal central venous pressure.  Impressions:  - Compared to a prior study in 2012, there is now moderate aortic   stenosis - AVA around 1.05 cm2 with mean gradient of 35 mmHg.  Assessment / Plan: 1. Coronary disease status post CABG. Redo coronary bypass surgery in 2001 with sequential saphenous vein graft to the first and second obtuse marginal vessels and a free RIMA graft to the LAD. Prior LIMA graft to the proximal LAD. Myoview study January 2016 was stable showing anterior ischemia similar to prior studies.  Cardiac cath in 2008 showed patent grafts. No significant angina. Continue aspirin, beta blocker, and statin therapy.   2. PAD with right  iliac occlusion. Stable claudication. Dopplers in August 2018 showed stable ABI of 0.75 on the right and normal on the left. No change  from prior.  3. Abdominal aortic aneurysm measuring 3.1 cm. Stable August 2018.   4. Hypertension- improved control off steroids. Will continue current therapy.  5. Tobacco abuse-encouraged complete cessation.  6. Obesity.  7. Lymphocytic colitis. Per Dr. Gala Romney.  8. Aortic stenosis- mild per Echo in 2012. Most recent Echo showed progression to moderate in April 2018 with valve area of 1.05 cm squared and valve mean gradient of 35. She does have some dyspnea on exertion and lightheadedness at times. Will update Echo. If AVR is needed hopefully she could have TAVR since surgical risk would be high.  9. Carotid arterial disease- moderate.   I will follow up in 6 months.

## 2017-03-18 NOTE — H&P (View-Only) (Signed)
[name] Date of Birth: 02/13/1940   History of Present Illness: [name] is seen today for follow up CAD, AS, and PAD. She has known CAD with redo CABG in 2001.  She has a known abdominal aortic aneurysm with last ultrasound  showing a  3.1 cm aneurysm in August 2018. She has a history of PAD with right iliac occlusion. Last ABIs in August 2018 showed stable right ABI of 0.75.  She has chronic claudication symptoms with pain in right thigh with walking for long distances. This has not changed.[name] Her last nuclear stress test in January 2016 showed  anterior wall ischemia. This was unchanged from 2012 and 2013. She had a cardiac cath in 2008 which  showed patent grafts.   She has  lymphocytic colitis. Followed by Dr. Gala Romney. This flared last Spring and she was placed on Entecort. Following this she developed edema and elevated BP. She states she felt dizzy, HA, and "crazy". She was started on Avapro. Symptoms resolved after she came off steroids.   On follow up today she reports she is doing pretty well. No edema and she doesn't use lasix.  BP is under good control.  Denies any  chest pain.  She does note SOB with exertion. Not really exercising much now. Smokes an occasional cigarette. No dizziness or palpitations.   Current Outpatient Medications on File Prior to Visit  Medication Sig Dispense Refill  . acetaminophen (TYLENOL) 500 MG tablet Take 1,000 mg by mouth every 6 (six) hours as needed (pain).     [name] amLODipine (NORVASC) 10 MG tablet Take 1 tablet (10 mg total) by mouth daily. 30 tablet 6  . aspirin EC 81 MG tablet Take 81 mg by mouth daily.    . chlorthalidone (HYGROTON) 25 MG tablet Take 1/2 tablet ( 12.5 mg ) daily 30 tablet 6  . FLUoxetine (PROZAC) 40 MG capsule TAKE ONE CAPSULE BY MOUTH EVERY DAY 90 capsule 1  . furosemide (LASIX) 20 MG tablet TAKE 1 TABLET BY MOUTH EVERY DAY AS NEEDED FOR EDEMA 30 tablet 11  . irbesartan (AVAPRO) 300 MG tablet TAKE 1 TABLET BY MOUTH EVERY DAY 30  tablet 4  . levothyroxine (SYNTHROID, LEVOTHROID) 50 MCG tablet TAKE 1 TABLET BY MOUTH EVERY DAY BEFORE BREAKFAST TAKE AN EXTRA 1/2 TAB ONE TIME EACH WEEK 96 tablet 4  . loperamide (IMODIUM) 2 MG capsule Take by mouth as needed for diarrhea or loose stools (Take as directed). Reported on 05/01/2015    . Melatonin 1 MG CAPS Take 1 tablet by mouth at bedtime.    . Nebivolol HCl (BYSTOLIC) 20 MG TABS Take 1 tablet (20 mg total) by mouth daily. 90 tablet 2  . nitroGLYCERIN (NITROSTAT) 0.4 MG SL tablet Place 0.4 mg under the tongue every 5 (five) minutes as needed for chest pain (MAX 3 TABLETS).    . pantoprazole (PROTONIX) 40 MG tablet Take 1 tablet (40 mg total) by mouth daily. (Patient taking differently: Take 40 mg by mouth 2 (two) times daily before a meal. ) 30 tablet 11  . simvastatin (ZOCOR) 40 MG tablet TAKE 1 TABLET OP AT BEDTIME 90 tablet 2   No current facility-administered medications on file prior to visit.     Allergies  Allergen Reactions  . Dipyridamole Hives  . Varenicline Hives  . Chantix [Varenicline Tartrate]     unknown  . Dipyridamole     unknown    Past Medical History:  Diagnosis Date  . Abdominal  aortic aneurysm ([place]) 2/12   infrarenal diagnosed with maximum measurement at 3.3 cm  . Depression   . Diverticulosis   . GERD (gastroesophageal reflux disease)   . History of tobacco abuse    Recurrent cigarette smoking  . Hyperlipidemia   . Hyperplastic colon polyp   . Hypertension   . Hypothyroidism   . Ischemic heart disease    had redo bypass surgery in 2001, at that time she had a free right internal mammary to the LAD, LAD endarterectomy, sequential vein graft to the OM1 and OM2 -- last catheterization was in 2008  . Leg fracture    remote right leg fracture  . Microscopic colitis   . Obesity    with gastric bypass surgery in 2004  . PAD (peripheral artery disease) ([place]) 04/13/2011   right iliac occlusion  . PONV (postoperative nausea and vomiting)      Past Surgical History:  Procedure Laterality Date  . CARDIAC CATHETERIZATION  02/16/1999   Left heart catheterization with selective coronary  angiography, left ventricular angiography (RAO and LAO views), angiography of left internal mammary artery, and angiography of saphenous vein grafts  . CARDIAC CATHETERIZATION  10/20/2006   Est. EF of 55% -- Totally occuladed native coronary circulation -- Persistent patency of the right mammary graft and the distal LAD artery with peristent patency of the left mammary graft to the proximal LAD artery with persistant patency of the saphenous vein graft to the sequentail branches of the OM        . CARPAL TUNNEL RELEASE    . CHOLECYSTECTOMY    . COLONOSCOPY  07/12/2007   Dr.Brodie- normal cecum and rectum, diverticulosis in the sigmoid colon  . COLONOSCOPY  1991   per Dr.Brodie office visit note= hyperplastic polyp  . COLONOSCOPY N/A 09/30/2014   Dr.Rourk- noraml appearing rectal mucosa, scattered L sided diverticula, colonic mucosa has a somewhat pale friable appearence diffusely, no ulcers or erosions seen, the distal 10cm of terminal ileum appeared entirely normal. bx=benign colorectal mucosa with lymphocytic colitis  . CORONARY ARTERY BYPASS GRAFT  1985  . CORONARY ARTERY BYPASS GRAFT  03/09/1999   Redo CABG x3 --  with the free right internal mammary artey to the LAD with an LAD endarterectomy -- Sequential saphenous vein graft to OM1 and OM2                   . ESOPHAGOGASTRODUODENOSCOPY N/A 09/30/2014   Dr.Rourk-mild erosive reflux esophagitis s/p bariatric surgery  . GASTRIC BYPASS  2004    Social History   Tobacco Use  Smoking Status Former Smoker  . Packs/day: 0.50  . Types: Cigarettes  . Start date: 01/10/1985  . Last attempt to quit: 02/11/2016  . Years since quitting: 1.1  Smokeless Tobacco Never Used    Social History   Substance and Sexual Activity  Alcohol Use No  . Alcohol/week: 0.0 oz    Family History  Problem Relation  Age of Onset  . Stroke Father   . Diabetes Mother   . Pancreatic cancer Mother     Review of Systems: The review of systems is as above.  All other systems were reviewed and are negative.  Physical Exam: BP (!) 116/54   Pulse 66   Ht 5\' 6"  (1.676 m)   Wt 208 lb (94.3 kg)   BMI 33.57 kg/m  GENERAL:  Obese WF in NAD HEENT:  PERRL, EOMI, sclera are clear. Oropharynx is clear. NECK:  No jugular venous distention,  carotid upstroke brisk and symmetric, bilateral  bruits, no thyromegaly or adenopathy LUNGS:  Clear to auscultation bilaterally CHEST:  Unremarkable HEART:  RRR,  PMI not displaced or sustained,S1 and S2 within normal limits, no S3, no S4: no clicks, no rubs, harsh gr 4-0/9 systolic murmur RUSB radiating to LSB ABD:  Soft, nontender. BS +, no masses or bruits. No hepatomegaly, no splenomegaly EXT: pulses in feet not palpable, feet warm and dry. no edema, no cyanosis no clubbing SKIN:  Warm and dry.  No rashes NEURO:  Alert and oriented x 3. Cranial nerves II through XII intact. PSYCH:  Cognitively intact     LABORATORY DATA: Lab Results  Component Value Date   CHOL 149 02/04/2014   HDL 60 02/04/2014   LDLCALC 64 02/04/2014   TRIG 124 02/04/2014   CHOLHDL 2.5 02/04/2014   Lab Results  Component Value Date   WBC 7.2 07/07/2016   HGB 14.2 07/07/2016   HCT 42.2 07/07/2016   PLT 195 07/07/2016   GLUCOSE 99 01/11/2017   CHOL 149 02/04/2014   TRIG 124 02/04/2014   HDL 60 02/04/2014   LDLCALC 64 02/04/2014   ALT 13 07/07/2016   AST 18 07/07/2016   NA 142 01/11/2017   K 4.8 01/11/2017   CL 104 01/11/2017   CREATININE 0.98 01/11/2017   BUN 16 01/11/2017   CO2 24 01/11/2017   TSH 3.180 07/07/2016   INR 1.0 10/16/2006   Labs dated 10/05/15: glucose 108. Otherwise CMET is normal. Cholesterol 130, triglycerides 114, HDL 63, LDL 44. TSH normal.  Dated 11/02/16: cholesterol 138, triglycerides 78, LDL 49, HDL 73. CMET and CBC normal.  Echo 04/19/16: Study  Conclusions  - Left ventricle: The cavity size was normal. Wall thickness was   normal. Systolic function was normal. The estimated ejection   fraction was in the range of 60% to 65%. Wall motion was normal;   there were no regional wall motion abnormalities. Doppler   parameters are consistent with pseudonormal left ventricular   relaxation (grade 2 diastolic dysfunction). The E/e&' ratio is   >15, suggesting elevated LV filling pressure. - Aortic valve: Calcified leaflets. Moderate stenosis. Mean   gradient (S): 35 mm Hg. Peak gradient (S): 61 mm Hg. Valve area   (VTI): 0.98 cm^2. Valve area (Vmax): 1.05 cm^2. Valve area   (Vmean): 0.98 cm^2. - Mitral valve: Mildly thickened leaflets . There was mild   regurgitation. - Left atrium: The atrium was moderately dilated. - Right ventricle: The cavity size was mildly dilated. Systolic   function is mildly reduced. - Tricuspid valve: There was mild regurgitation. - Pulmonary arteries: PA peak pressure: 34 mm Hg (S). - Inferior vena cava: The vessel was normal in size. The   respirophasic diameter changes were in the normal range (>= 50%),   consistent with normal central venous pressure.  Impressions:  - Compared to a prior study in 2012, there is now moderate aortic   stenosis - AVA around 1.05 cm2 with mean gradient of 35 mmHg.  Assessment / Plan: 1. Coronary disease status post CABG. Redo coronary bypass surgery in 2001 with sequential saphenous vein graft to the first and second obtuse marginal vessels and a free RIMA graft to the LAD. Prior LIMA graft to the proximal LAD. Myoview study January 2016 was stable showing anterior ischemia similar to prior studies.  Cardiac cath in 2008 showed patent grafts. No significant angina. Continue aspirin, beta blocker, and statin therapy.   2. PAD with right  iliac occlusion. Stable claudication. Dopplers in August 2018 showed stable ABI of 0.75 on the right and normal on the left. No change  from prior.  3. Abdominal aortic aneurysm measuring 3.1 cm. Stable August 2018.   4. Hypertension- improved control off steroids. Will continue current therapy.  5. Tobacco abuse-encouraged complete cessation.  6. Obesity.  7. Lymphocytic colitis. Per Dr. Gala Romney.  8. Aortic stenosis- mild per Echo in 2012. Most recent Echo showed progression to moderate in April 2018 with valve area of 1.05 cm squared and valve mean gradient of 35. She does have some dyspnea on exertion and lightheadedness at times. Will update Echo. If AVR is needed hopefully she could have TAVR since surgical risk would be high.  9. Carotid arterial disease- moderate.   I will follow up in 6 months.

## 2017-03-20 ENCOUNTER — Encounter: Payer: Self-pay | Admitting: Cardiology

## 2017-03-20 ENCOUNTER — Ambulatory Visit: Payer: Medicare Other | Admitting: Cardiology

## 2017-03-20 VITALS — BP 116/54 | HR 66 | Ht 66.0 in | Wt 208.0 lb

## 2017-03-20 DIAGNOSIS — I739 Peripheral vascular disease, unspecified: Secondary | ICD-10-CM | POA: Diagnosis not present

## 2017-03-20 DIAGNOSIS — I25810 Atherosclerosis of coronary artery bypass graft(s) without angina pectoris: Secondary | ICD-10-CM | POA: Diagnosis not present

## 2017-03-20 DIAGNOSIS — I10 Essential (primary) hypertension: Secondary | ICD-10-CM | POA: Diagnosis not present

## 2017-03-20 DIAGNOSIS — I714 Abdominal aortic aneurysm, without rupture, unspecified: Secondary | ICD-10-CM

## 2017-03-20 DIAGNOSIS — I350 Nonrheumatic aortic (valve) stenosis: Secondary | ICD-10-CM

## 2017-03-20 NOTE — Patient Instructions (Signed)
We will schedule you for an Echocardiogram  Continue your current therapy  I will see you in 6 months.

## 2017-03-29 ENCOUNTER — Other Ambulatory Visit: Payer: Self-pay

## 2017-03-29 ENCOUNTER — Ambulatory Visit (HOSPITAL_COMMUNITY): Payer: Medicare Other | Attending: Cardiology

## 2017-03-29 DIAGNOSIS — E039 Hypothyroidism, unspecified: Secondary | ICD-10-CM | POA: Diagnosis not present

## 2017-03-29 DIAGNOSIS — E669 Obesity, unspecified: Secondary | ICD-10-CM | POA: Diagnosis not present

## 2017-03-29 DIAGNOSIS — I10 Essential (primary) hypertension: Secondary | ICD-10-CM | POA: Insufficient documentation

## 2017-03-29 DIAGNOSIS — I714 Abdominal aortic aneurysm, without rupture, unspecified: Secondary | ICD-10-CM

## 2017-03-29 DIAGNOSIS — I25810 Atherosclerosis of coronary artery bypass graft(s) without angina pectoris: Secondary | ICD-10-CM

## 2017-03-29 DIAGNOSIS — E785 Hyperlipidemia, unspecified: Secondary | ICD-10-CM | POA: Diagnosis not present

## 2017-03-29 DIAGNOSIS — I350 Nonrheumatic aortic (valve) stenosis: Secondary | ICD-10-CM

## 2017-03-29 DIAGNOSIS — Z720 Tobacco use: Secondary | ICD-10-CM | POA: Insufficient documentation

## 2017-03-29 DIAGNOSIS — I080 Rheumatic disorders of both mitral and aortic valves: Secondary | ICD-10-CM | POA: Insufficient documentation

## 2017-03-29 DIAGNOSIS — I739 Peripheral vascular disease, unspecified: Secondary | ICD-10-CM | POA: Insufficient documentation

## 2017-03-31 ENCOUNTER — Telehealth: Payer: Self-pay

## 2017-03-31 ENCOUNTER — Ambulatory Visit
Admission: RE | Admit: 2017-03-31 | Discharge: 2017-03-31 | Disposition: A | Discharge status: Home / Self Care | Payer: Medicare Other | Source: Ambulatory Visit | Attending: Cardiology | Admitting: Cardiology

## 2017-03-31 ENCOUNTER — Other Ambulatory Visit: Payer: Self-pay

## 2017-03-31 DIAGNOSIS — I2510 Atherosclerotic heart disease of native coronary artery without angina pectoris: Secondary | ICD-10-CM

## 2017-03-31 DIAGNOSIS — I10 Essential (primary) hypertension: Secondary | ICD-10-CM

## 2017-03-31 LAB — CBC WITH DIFFERENTIAL/PLATELET
BASOS ABS: 0 10*3/uL (ref 0.0–0.2)
Basos: 1 %
EOS (ABSOLUTE): 0.1 10*3/uL (ref 0.0–0.4)
Eos: 2 %
HEMOGLOBIN: 13.4 g/dL (ref 11.1–15.9)
Hematocrit: 40.5 % (ref 34.0–46.6)
IMMATURE GRANS (ABS): 0 10*3/uL (ref 0.0–0.1)
IMMATURE GRANULOCYTES: 0 %
LYMPHS: 32 %
Lymphocytes Absolute: 2 10*3/uL (ref 0.7–3.1)
MCH: 32 pg (ref 26.6–33.0)
MCHC: 33.1 g/dL (ref 31.5–35.7)
MCV: 97 fL (ref 79–97)
MONOCYTES: 10 %
Monocytes Absolute: 0.7 10*3/uL (ref 0.1–0.9)
NEUTROS ABS: 3.5 10*3/uL (ref 1.4–7.0)
NEUTROS PCT: 55 %
PLATELETS: 212 10*3/uL (ref 150–379)
RBC: 4.19 x10E6/uL (ref 3.77–5.28)
RDW: 15.1 % (ref 12.3–15.4)
WBC: 6.4 10*3/uL (ref 3.4–10.8)

## 2017-03-31 LAB — BASIC METABOLIC PANEL
BUN/Creatinine Ratio: 17 (ref 12–28)
BUN: 17 mg/dL (ref 8–27)
CALCIUM: 9.4 mg/dL (ref 8.7–10.3)
CHLORIDE: 102 mmol/L (ref 96–106)
CO2: 23 mmol/L (ref 20–29)
CREATININE: 1 mg/dL (ref 0.57–1.00)
GFR calc Af Amer: 64 mL/min/{1.73_m2} (ref >59)
GFR calc non Af Amer: 56 mL/min/{1.73_m2} — ABNORMAL LOW (ref >59)
GLUCOSE: 98 mg/dL (ref 65–99)
Potassium: 4.9 mmol/L (ref 3.5–5.2)
Sodium: 139 mmol/L (ref 134–144)

## 2017-03-31 LAB — PT AND PTT
APTT: 26 s (ref 24–33)
INR: 1 (ref 0.8–1.2)
Prothrombin Time: 10.4 s (ref 9.1–12.0)

## 2017-03-31 NOTE — Telephone Encounter (Addendum)
@  [name] [address] [place] [address] Dept: [phone] Loc: [phone]  [name]  03/31/2017  You are scheduled for a Right and Left Cardiac Cath on Tuesday 04/04/17, with Dr. [place].  1. Please arrive at the [facility] (Main Entrance A) at [facility]: [address] [place], [place] [address] at 8:00 am (two hours before your procedure to ensure your preparation). Free valet parking service is available.   Special note: Every effort is made to have your procedure done on time. Please understand that emergencies sometimes delay scheduled procedures.  2. Diet: NOTHING TO EAT OR DRINK AFTER MIDNIGHT Monday NIGHT 04/03/17.  3. Labs: bmet,cbc,pt to be done Friday 03/31/17 at [facility] office [facility]    CHEST XRAY to be done at [facility].Friday 03/31/17  4. Medication instructions in preparation for your procedure:   TAKE ASPIRIN 81 mg morning of cath.      Hold Furosemide morning of cath.       You may take medications with sips of water.   5. Plan for one night stay--bring personal belongings. 6. Bring a current list of your medications and current insurance cards. 7. You MUST have a responsible person to drive you home. 8. Someone MUST be with you the first 24 hours after you arrive home or your discharge will be delayed. 9. Please wear clothes that are easy to get on and off and wear slip-on shoes.  Thank you for allowing [place] to care for you!   -- [facility]

## 2017-04-03 ENCOUNTER — Other Ambulatory Visit: Payer: Self-pay | Admitting: Cardiology

## 2017-04-03 ENCOUNTER — Telehealth: Payer: Self-pay | Admitting: *Deleted

## 2017-04-03 DIAGNOSIS — I350 Nonrheumatic aortic (valve) stenosis: Secondary | ICD-10-CM

## 2017-04-03 NOTE — Telephone Encounter (Addendum)
Catheterization scheduled at [facility] for: Tuesday April 04, 2017 10:30 AM Arrival time and place: [place] at: 8 AM   Hold: Chlorothalidone AM of cath Furosemide AM of cath  Except hold medications AM  meds can be  taken pre-cath with sip of water including: ASA 81 mg  Confirmed patient has responsible person to drive home post procedure and observe patient for 24 hours: yes

## 2017-04-04 ENCOUNTER — Encounter (HOSPITAL_COMMUNITY): Payer: Self-pay | Admitting: Cardiology

## 2017-04-04 ENCOUNTER — Other Ambulatory Visit: Payer: Self-pay

## 2017-04-04 ENCOUNTER — Ambulatory Visit (HOSPITAL_COMMUNITY)
Admission: RE | Admit: 2017-04-04 | Discharge: 2017-04-04 | Disposition: A | Discharge status: Home / Self Care | Payer: Medicare Other | Source: Ambulatory Visit | Attending: Cardiology | Admitting: Cardiology

## 2017-04-04 ENCOUNTER — Encounter (HOSPITAL_COMMUNITY)
Admission: RE | Disposition: A | Discharge status: Home / Self Care | Payer: Self-pay | Source: Ambulatory Visit | Attending: Cardiology

## 2017-04-04 DIAGNOSIS — Z79890 Hormone replacement therapy (postmenopausal): Secondary | ICD-10-CM | POA: Diagnosis not present

## 2017-04-04 DIAGNOSIS — I739 Peripheral vascular disease, unspecified: Secondary | ICD-10-CM | POA: Diagnosis not present

## 2017-04-04 DIAGNOSIS — Z7982 Long term (current) use of aspirin: Secondary | ICD-10-CM | POA: Insufficient documentation

## 2017-04-04 DIAGNOSIS — I10 Essential (primary) hypertension: Secondary | ICD-10-CM | POA: Diagnosis not present

## 2017-04-04 DIAGNOSIS — I714 Abdominal aortic aneurysm, without rupture: Secondary | ICD-10-CM | POA: Diagnosis not present

## 2017-04-04 DIAGNOSIS — K52832 Lymphocytic colitis: Secondary | ICD-10-CM | POA: Diagnosis not present

## 2017-04-04 DIAGNOSIS — Z6831 Body mass index (BMI) 31.0-31.9, adult: Secondary | ICD-10-CM | POA: Insufficient documentation

## 2017-04-04 DIAGNOSIS — E039 Hypothyroidism, unspecified: Secondary | ICD-10-CM | POA: Insufficient documentation

## 2017-04-04 DIAGNOSIS — I25810 Atherosclerosis of coronary artery bypass graft(s) without angina pectoris: Secondary | ICD-10-CM | POA: Diagnosis not present

## 2017-04-04 DIAGNOSIS — Z87891 Personal history of nicotine dependence: Secondary | ICD-10-CM | POA: Insufficient documentation

## 2017-04-04 DIAGNOSIS — I350 Nonrheumatic aortic (valve) stenosis: Secondary | ICD-10-CM | POA: Diagnosis present

## 2017-04-04 DIAGNOSIS — Z9884 Bariatric surgery status: Secondary | ICD-10-CM | POA: Insufficient documentation

## 2017-04-04 DIAGNOSIS — E785 Hyperlipidemia, unspecified: Secondary | ICD-10-CM | POA: Insufficient documentation

## 2017-04-04 DIAGNOSIS — I272 Pulmonary hypertension, unspecified: Secondary | ICD-10-CM | POA: Insufficient documentation

## 2017-04-04 DIAGNOSIS — E669 Obesity, unspecified: Secondary | ICD-10-CM | POA: Insufficient documentation

## 2017-04-04 DIAGNOSIS — I2510 Atherosclerotic heart disease of native coronary artery without angina pectoris: Secondary | ICD-10-CM | POA: Insufficient documentation

## 2017-04-04 DIAGNOSIS — K219 Gastro-esophageal reflux disease without esophagitis: Secondary | ICD-10-CM | POA: Diagnosis not present

## 2017-04-04 DIAGNOSIS — Z79899 Other long term (current) drug therapy: Secondary | ICD-10-CM | POA: Insufficient documentation

## 2017-04-04 HISTORY — PX: RIGHT/LEFT HEART CATH AND CORONARY/GRAFT ANGIOGRAPHY: CATH118267

## 2017-04-04 LAB — POCT I-STAT 3, VENOUS BLOOD GAS (G3P V)
Bicarbonate: 25.8 mmol/L (ref 20.0–28.0)
O2 SAT: 76 %
PCO2 VEN: 47.9 mmHg (ref 44.0–60.0)
TCO2: 27 mmol/L (ref 22–32)
pH, Ven: 7.339 (ref 7.250–7.430)
pO2, Ven: 44 mmHg (ref 32.0–45.0)

## 2017-04-04 LAB — POCT I-STAT 3, ART BLOOD GAS (G3+)
Bicarbonate: 25.9 mmol/L (ref 20.0–28.0)
O2 SAT: 99 %
PCO2 ART: 45.4 mmHg (ref 32.0–48.0)
PO2 ART: 126 mmHg — AB (ref 83.0–108.0)
TCO2: 27 mmol/L (ref 22–32)
pH, Arterial: 7.365 (ref 7.350–7.450)

## 2017-04-04 SURGERY — RIGHT/LEFT HEART CATH AND CORONARY/GRAFT ANGIOGRAPHY
Anesthesia: LOCAL

## 2017-04-04 MED ORDER — IOPAMIDOL (ISOVUE-370) INJECTION 76%
INTRAVENOUS | Status: AC
  Filled 2017-04-04: qty 125

## 2017-04-04 MED ORDER — ACETAMINOPHEN 325 MG PO TABS
650.0000 mg | ORAL_TABLET | ORAL | Status: DC | PRN
  Administered 2017-04-04: 650 mg via ORAL

## 2017-04-04 MED ORDER — HEPARIN SODIUM (PORCINE) 1000 UNIT/ML IJ SOLN
INTRAMUSCULAR | Status: AC
  Filled 2017-04-04: qty 1

## 2017-04-04 MED ORDER — SODIUM CHLORIDE 0.9 % IV SOLN
INTRAVENOUS | Status: DC
  Administered 2017-04-04: 08:00:00 via INTRAVENOUS

## 2017-04-04 MED ORDER — ASPIRIN 81 MG PO CHEW: 81.0000 mg | CHEWABLE_TABLET | ORAL | Status: DC

## 2017-04-04 MED ORDER — SODIUM CHLORIDE 0.9% FLUSH: 3.0000 mL | Freq: Two times a day (BID) | INTRAVENOUS | Status: DC

## 2017-04-04 MED ORDER — MIDAZOLAM HCL 2 MG/2ML IJ SOLN
INTRAMUSCULAR | Status: AC
  Filled 2017-04-04: qty 2

## 2017-04-04 MED ORDER — VERAPAMIL HCL 2.5 MG/ML IV SOLN
INTRAVENOUS | Status: AC
  Filled 2017-04-04: qty 2

## 2017-04-04 MED ORDER — SODIUM CHLORIDE 0.9 % IV SOLN: 250.0000 mL | INTRAVENOUS | Status: DC | PRN

## 2017-04-04 MED ORDER — VERAPAMIL HCL 2.5 MG/ML IV SOLN
INTRAVENOUS | Status: DC | PRN
  Administered 2017-04-04: 09:00:00 via INTRA_ARTERIAL

## 2017-04-04 MED ORDER — ONDANSETRON HCL 4 MG/2ML IJ SOLN
4.0000 mg | Freq: Four times a day (QID) | INTRAMUSCULAR | Status: DC | PRN
Start: 2017-04-04 — End: 2017-04-04

## 2017-04-04 MED ORDER — HEPARIN SODIUM (PORCINE) 1000 UNIT/ML IJ SOLN
INTRAMUSCULAR | Status: DC | PRN
  Administered 2017-04-04: 4500 [IU] via INTRAVENOUS

## 2017-04-04 MED ORDER — SODIUM CHLORIDE 0.9 % WEIGHT BASED INFUSION: 1.0000 mL/kg/h | INTRAVENOUS | Status: DC

## 2017-04-04 MED ORDER — SODIUM CHLORIDE 0.9% FLUSH: 3.0000 mL | INTRAVENOUS | Status: DC | PRN

## 2017-04-04 MED ORDER — MIDAZOLAM HCL 2 MG/2ML IJ SOLN
INTRAMUSCULAR | Status: DC | PRN
  Administered 2017-04-04: 1 mg via INTRAVENOUS

## 2017-04-04 MED ORDER — HEPARIN (PORCINE) IN NACL 2-0.9 UNIT/ML-% IJ SOLN
INTRAMUSCULAR | Status: AC
  Filled 2017-04-04: qty 1000

## 2017-04-04 MED ORDER — FENTANYL CITRATE (PF) 100 MCG/2ML IJ SOLN
INTRAMUSCULAR | Status: AC
  Filled 2017-04-04: qty 2

## 2017-04-04 MED ORDER — FENTANYL CITRATE (PF) 100 MCG/2ML IJ SOLN
INTRAMUSCULAR | Status: DC | PRN
  Administered 2017-04-04: 25 ug via INTRAVENOUS

## 2017-04-04 MED ORDER — LIDOCAINE HCL 1 % IJ SOLN
INTRAMUSCULAR | Status: AC
  Filled 2017-04-04: qty 20

## 2017-04-04 MED ORDER — LIDOCAINE HCL (PF) 1 % IJ SOLN
INTRAMUSCULAR | Status: DC | PRN
  Administered 2017-04-04 (×2): 2 mL

## 2017-04-04 MED ORDER — ACETAMINOPHEN 325 MG PO TABS
ORAL_TABLET | ORAL | Status: AC
  Filled 2017-04-04: qty 2

## 2017-04-04 MED ORDER — HEPARIN (PORCINE) IN NACL 2-0.9 UNIT/ML-% IJ SOLN
INTRAMUSCULAR | Status: AC | PRN
  Administered 2017-04-04 (×3): 500 mL via INTRA_ARTERIAL

## 2017-04-04 MED ORDER — IOPAMIDOL (ISOVUE-370) INJECTION 76%
INTRAVENOUS | Status: DC | PRN
  Administered 2017-04-04: 75 mL via INTRA_ARTERIAL

## 2017-04-04 SURGICAL SUPPLY — 16 items
BAND ZEPHYR COMPRESS 30 LONG (HEMOSTASIS) ×2 IMPLANT
CATH BALLN WEDGE 5F 110CM (CATHETERS) ×2 IMPLANT
CATH INFINITI 5 FR IM (CATHETERS) ×2 IMPLANT
CATH INFINITI 5FR AL1 (CATHETERS) ×2 IMPLANT
CATH INFINITI 5FR MULTPACK ANG (CATHETERS) ×2 IMPLANT
GUIDEWIRE INQWIRE 1.5J.035X260 (WIRE) ×1 IMPLANT
INQWIRE 1.5J .035X260CM (WIRE) ×2
KIT HEART LEFT (KITS) ×2 IMPLANT
NEEDLE PERC 21GX4CM (NEEDLE) ×2 IMPLANT
PACK CARDIAC CATHETERIZATION (CUSTOM PROCEDURE TRAY) ×2 IMPLANT
SHEATH RAIN 4/5FR (SHEATH) ×2 IMPLANT
SHEATH RAIN RADIAL 21G 6FR (SHEATH) ×2 IMPLANT
SYR MEDRAD MARK V 150ML (SYRINGE) IMPLANT
TRANSDUCER W/STOPCOCK (MISCELLANEOUS) ×2 IMPLANT
TUBING CIL FLEX 10 FLL-RA (TUBING) ×2 IMPLANT
WIRE EMERALD ST .035X150CM (WIRE) ×2 IMPLANT

## 2017-04-04 NOTE — Interval H&P Note (Signed)
History and Physical Interval Note:  04/04/2017 8:29 AM  [name]  has presented today for surgery, with the diagnosis of aortic stenosis, pre-tavr  The various methods of treatment have been discussed with the patient and family. After consideration of risks, benefits and other options for treatment, the patient has consented to  Procedure(s): RIGHT/LEFT HEART CATH AND CORONARY/GRAFT ANGIOGRAPHY (N/A) as a surgical intervention .  The patient's history has been reviewed, patient examined, no change in status, stable for surgery.  I have reviewed the patient's chart and labs.  Questions were answered to the patient's satisfaction.     Collier [facility] 04/04/2017 8:29 AM

## 2017-04-04 NOTE — Discharge Instructions (Signed)

## 2017-04-05 MED FILL — Lidocaine HCl Local Inj 1%: INTRAMUSCULAR | Qty: 20 | Status: AC

## 2017-04-07 ENCOUNTER — Encounter: Payer: Self-pay | Admitting: Cardiovascular Disease

## 2017-04-07 ENCOUNTER — Ambulatory Visit: Payer: Medicare Other | Admitting: Cardiovascular Disease

## 2017-04-07 ENCOUNTER — Other Ambulatory Visit: Payer: Self-pay

## 2017-04-07 VITALS — BP 122/70 | HR 69 | Ht 67.5 in | Wt 212.0 lb

## 2017-04-07 DIAGNOSIS — I350 Nonrheumatic aortic (valve) stenosis: Secondary | ICD-10-CM

## 2017-04-07 NOTE — Progress Notes (Signed)
Cardiology Office Note Date:  04/09/2017   ID:  Sunday, Klos [birth year], MRN [id number]  PCP:  [name], MD  Cardiologist:  Peter Martinique, MD  Chief Complaint  Patient presents with  . Shortness of Breath     History of Present Illness: [name] is a 77 y.o. female who presents for  evaluation of aortic stenosis, referred by Dr. Martinique.  She has a history of extensive coronary artery disease and underwent redo CABG in 2001.  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    Dr Redmond Pulling performed her original surgery in about 1985 and Dr Servando Snare did her redo CABG in 2001. She underwent recent cardiac cath and echo studies. She is here alone today. She's been married for many years. Her husband is not in good health. He's had a cerebellar stroke and had a fall and hip fracture last year. He also has lung cancer. 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 She retired from the [employer] at [employer] about 12 years ago.   The patient is limited by progressive shortness of breath with activity over the past 3-6 months. She is also experiencing episodic PND and orthopnea. Otherwise does not have dyspnea at rest. She has had mild chest pressure with exertion. She is also limited by orthopedic issues (knee pain) and claudication of the right leg. She has not had resting symptoms of leg ischemia such as rest pain or ischemic ulceration.    Past Medical History:  Diagnosis Date  . Abdominal aortic aneurysm ([place]) 2/12   infrarenal diagnosed with maximum measurement at 3.3 cm  . Depression   . Diverticulosis   . GERD (gastroesophageal reflux disease)   . History of tobacco abuse    Recurrent cigarette smoking  . Hyperlipidemia   . Hyperplastic colon polyp   . Hypertension   . Hypothyroidism   . Ischemic heart disease    had redo bypass surgery in 2001, at that time  she had a free right internal mammary to the LAD, LAD endarterectomy, sequential vein graft to the OM1 and OM2 -- last catheterization was in 2008  . Leg fracture    remote right leg fracture  . Microscopic colitis   . Obesity    with gastric bypass surgery in 2004  . PAD (peripheral artery disease) ([place]) 04/13/2011   right iliac occlusion  . PONV (postoperative nausea and vomiting)     Past Surgical History:  Procedure Laterality Date  . CARDIAC CATHETERIZATION  02/16/1999   Left heart catheterization with selective coronary  angiography, left ventricular angiography (RAO and LAO views), angiography of left internal mammary artery, and angiography of saphenous vein grafts  . CARDIAC CATHETERIZATION  10/20/2006   Est. EF of 55% -- Totally occuladed native coronary circulation -- Persistent patency of the right mammary graft and the distal LAD artery with peristent patency of the left mammary graft to the proximal LAD artery with persistant patency of the saphenous vein graft to the sequentail branches of the OM        . CARPAL TUNNEL RELEASE    . CHOLECYSTECTOMY    . COLONOSCOPY  07/12/2007   Dr.Brodie- normal cecum and rectum, diverticulosis in the sigmoid colon  . COLONOSCOPY  1991   per Dr.Brodie office visit note= hyperplastic polyp  . COLONOSCOPY N/A 09/30/2014   Dr.Rourk- noraml appearing rectal mucosa, scattered  L sided diverticula, colonic mucosa has a somewhat pale friable appearence diffusely, no ulcers or erosions seen, the distal 10cm of terminal ileum appeared entirely normal. bx=benign colorectal mucosa with lymphocytic colitis  . CORONARY ARTERY BYPASS GRAFT  1985  . CORONARY ARTERY BYPASS GRAFT  03/09/1999   Redo CABG x3 --  with the free right internal mammary artey to the LAD with an LAD endarterectomy -- Sequential saphenous vein graft to OM1 and OM2                   . ESOPHAGOGASTRODUODENOSCOPY N/A 09/30/2014   Dr.Rourk-mild erosive reflux esophagitis s/p bariatric surgery   . GASTRIC BYPASS  2004  . RIGHT/LEFT HEART CATH AND CORONARY/GRAFT ANGIOGRAPHY N/A 04/04/2017   Procedure: RIGHT/LEFT HEART CATH AND CORONARY/GRAFT ANGIOGRAPHY;  Surgeon: Martinique, Peter M, MD;  Location: [facility];  Service: Cardiovascular;  Laterality: N/A;    Current Outpatient Medications  Medication Sig Dispense Refill  . acetaminophen (TYLENOL) 500 MG tablet Take 1,000 mg by mouth every 6 (six) hours as needed for moderate pain or headache.     . ALPRAZolam (XANAX) 0.25 MG tablet Take 0.25 mg by mouth 3 (three) times daily as needed for anxiety.  0  . aspirin EC 81 MG tablet Take 81 mg by mouth at bedtime.     . chlorthalidone (HYGROTON) 25 MG tablet Take 1/2 tablet ( 12.5 mg ) daily (Patient taking differently: Take 12.5 mg by mouth daily. ) 30 tablet 6  . FLUoxetine (PROZAC) 40 MG capsule TAKE ONE CAPSULE BY MOUTH EVERY DAY (Patient taking differently: Take 40 mg by mouth in the evening) 90 capsule 1  . furosemide (LASIX) 20 MG tablet TAKE 1 TABLET BY MOUTH EVERY DAY AS NEEDED FOR EDEMA (Patient taking differently: TAKE 20 MG BY MOUTH EVERY DAY AS NEEDED FOR EDEMA) 30 tablet 11  . irbesartan (AVAPRO) 300 MG tablet TAKE 1 TABLET BY MOUTH EVERY DAY (Patient taking differently: Take 300 mg by mouth every day) 30 tablet 4  . levothyroxine (SYNTHROID, LEVOTHROID) 50 MCG tablet TAKE 1 TABLET BY MOUTH EVERY DAY BEFORE BREAKFAST TAKE AN EXTRA 1/2 TAB ONE TIME EACH WEEK (Patient taking differently: Take 50 mcg by mouth every morning before breakfast) 96 tablet 4  . loperamide (IMODIUM) 2 MG capsule Take 2 mg by mouth as needed for diarrhea or loose stools. Reported on 05/01/2015    . Nebivolol HCl (BYSTOLIC) 20 MG TABS Take 1 tablet (20 mg total) by mouth daily. (Patient taking differently: Take 20 mg by mouth every evening. ) 90 tablet 2  . nitroGLYCERIN (NITROSTAT) 0.4 MG SL tablet Place 0.4 mg under the tongue every 5 (five) minutes as needed for chest pain (MAX 3 TABLETS).    .  pantoprazole (PROTONIX) 40 MG tablet Take 1 tablet (40 mg total) by mouth daily. (Patient taking differently: Take 40 mg by mouth every evening. ) 30 tablet 11  . PROAIR HFA 108 (90 Base) MCG/ACT inhaler Inhale 2 puffs into the lungs every 4 (four) hours as needed for shortness of breath or wheezing.  2  . simvastatin (ZOCOR) 40 MG tablet TAKE 1 TABLET OP AT BEDTIME (Patient taking differently: Take 40 mg by mouth at bedtime) 90 tablet 2  . SYMBICORT 160-4.5 MCG/ACT inhaler Inhale 1 puff into the lungs 2 (two) times daily.  2  . amLODipine (NORVASC) 10 MG tablet Take 1 tablet (10 mg total) by mouth daily. 30 tablet 6   No current facility-administered medications  for this visit.     Allergies:   Dipyridamole and Chantix [varenicline tartrate]   Social History:  The patient  reports that she has been smoking cigarettes.  She started smoking about 32 years ago. She has been smoking about 0.25 packs per day. She has never used smokeless tobacco. She reports that she does not drink alcohol or use drugs.   Family History:  The patient's family history includes Diabetes in her mother; Pancreatic cancer in her mother; Stroke in her father.    ROS:  Please see the history of present illness.  Otherwise, review of systems is positive for depression, leg swelling, easy bruising, wheezing, anxiety, balance/walking problems.  All other systems are reviewed and negative.    PHYSICAL EXAM: VS:  BP 122/70   Pulse 69   Ht 5' 7.5" (1.715 m)   Wt 212 lb (96.2 kg)   SpO2 97%   BMI 32.71 kg/m  , BMI Body mass index is 32.71 kg/m. GEN: Well nourished, well developed, overweight woman in no acute distress  HEENT: normal  Neck: no JVD, no masses. bilateral carotid bruits Cardiac: RRR with 3/6 harsh laste peaking systolic murmur at the RUSB             Respiratory:  clear to auscultation bilaterally, normal work of breathing GI: soft, nontender, nondistended, + BS MS: no deformity or atrophy  Ext: 1+  bilateral pretibial edema, left femoral pulse 2+ Skin: warm and dry, no rash Neuro:  Strength and sensation are intact Psych: euthymic mood, full affect  EKG:  EKG is not ordered today.  Recent Labs: 07/07/2016: ALT 13; TSH 3.180 03/31/2017: BUN 17; Creatinine, Ser 1.00; Hemoglobin 13.4; Platelets 212; Potassium 4.9; Sodium 139   Lipid Panel     Component Value Date/Time   CHOL 149 02/04/2014 0831   TRIG 124 02/04/2014 0831   HDL 60 02/04/2014 0831   CHOLHDL 2.5 02/04/2014 0831   VLDL 25 02/04/2014 0831   LDLCALC 64 02/04/2014 0831      Wt Readings from Last 3 Encounters:  04/07/17 212 lb (96.2 kg)  04/04/17 202 lb (91.6 kg)  03/20/17 208 lb (94.3 kg)     Cardiac Studies Reviewed: Cardiac Catheterization 04-04-2017: Conclusion     Ost LAD to Prox LAD lesion is 100% stenosed.  Prox LAD to Mid LAD lesion is 100% stenosed.  Ost Cx to Mid Cx lesion is 100% stenosed.  Prox RCA to Mid RCA lesion is 100% stenosed.  LIMA graft was visualized by angiography and is normal in caliber.  The graft exhibits no disease.  Seq SVG- OM 1 and OM 2 graft was visualized by angiography and is large.  SVG graft was visualized by angiography and is normal in caliber.  The graft exhibits no disease.  Dist Graft lesion is 30% stenosed.  Origin lesion is 35% stenosed.  There is severe aortic valve stenosis.  LV end diastolic pressure is mildly elevated.  Hemodynamic findings consistent with mild pulmonary hypertension.   1. Severe 3 vessel occlusive CAD 2. Patent LIMA to the mid LAD. This only supplies a septal perforator.  3. Patent SVG to mid to distal LAD 4. Patent sequential SVG to OM 1 and OM 2 5. Severe Aortic stenosis. Mean gradient 32 mm Hg, peak gradient 35 mm Hg. Valve area 1.1 cm squared. Index 0.56 6. Normal cardiac output 7. Mild pulmonary HTN with mildly elevated LV filling pressures.   Plan: Coronary circulation is unchanged from 2008. Patient has  progressive  symptomatic aortic stenosis. Will refer for TAVR evaluation.   Indications   Nonrheumatic aortic valve stenosis [I35.0 (ICD-10-CM)]  Procedural Details/Technique   Technical Details Indication: 77 yo WF with history of CAD s/p redo CABG presents with progressive aortic stenosis.  Procedural Details: The left wrist was prepped, draped, and anesthetized with 1% lidocaine. Using the modified Seldinger technique a 6 Fr slender sheath was placed in the left radial artery and a 5 French sheath was placed in the right brachial vein. A Swan-Ganz catheter was used for the right heart catheterization. Standard protocol was followed for recording of right heart pressures and sampling of oxygen saturations. Fick cardiac output was calculated. Standard Judkins catheters were used for selective coronary and graft angiography and left ventricular pressures. There were no immediate procedural complications. The patient was transferred to the post catheterization recovery area for further monitoring.  Contrast: 75 cc   Estimated blood loss <50 mL.  During this procedure the patient was administered the following to achieve and maintain moderate conscious sedation: Versed 1 mg, Fentanyl 25 mcg, while the patient's heart rate, blood pressure, and oxygen saturation were continuously monitored. The period of conscious sedation was 40 minutes, of which I was present face-to-face 100% of this time.  Complications   Complications documented before study signed (04/04/2017 9:59 AM EDT)    No complications were associated with this study.  Documented by Martinique, Peter M, MD - 04/04/2017 9:50 AM EDT    Coronary Findings   Diagnostic  Dominance: Right  Left Anterior Descending  Ost LAD to Prox LAD lesion 100% stenosed  Ost LAD to Prox LAD lesion is 100% stenosed.  Prox LAD to Mid LAD lesion 100% stenosed  Prox LAD to Mid LAD lesion is 100% stenosed.  Left Circumflex  Ost Cx to Mid Cx lesion 100% stenosed  Ost  Cx to Mid Cx lesion is 100% stenosed.  Right Coronary Artery  Prox RCA to Mid RCA lesion 100% stenosed  Prox RCA to Mid RCA lesion is 100% stenosed.  Acute Marginal Branch  Collaterals  Acute Mrg filled by collaterals from 2nd Mrg.    Right Posterior Atrioventricular Branch  Collaterals  Post Atrio filled by collaterals from Dist LAD.    LIMA LIMA Graft to Prox LAD  LIMA graft was visualized by angiography and is normal in caliber. The graft exhibits no disease.  Sequential jump graft Graft to 1st Mrg, 2nd Mrg  Seq SVG- OM 1 and OM 2 graft was visualized by angiography and is large.  saphenous Graft to Mid LAD  SVG graft was visualized by angiography and is normal in caliber. The graft exhibits no disease.  Origin lesion 35% stenosed  Origin lesion is 35% stenosed. The lesion is eccentric.  Dist Graft lesion 30% stenosed  Dist Graft lesion is 30% stenosed.  Intervention   No interventions have been documented.  Right Heart   Right Heart Pressures Hemodynamic findings consistent with mild pulmonary hypertension.  Left Heart   Left Ventricle LV end diastolic pressure is mildly elevated.  Aortic Valve There is severe aortic valve stenosis. The aortic valve is calcified.  Coronary Diagrams   Diagnostic Diagram        Echo 03-29-2017: Left ventricle:  The cavity size was normal. Wall thickness was increased in a pattern of mild LVH. Systolic function was normal. The estimated ejection fraction was in the range of 55% to 60%. Wall motion was normal; there were no regional wall motion abnormalities. Doppler  parameters are consistent with abnormal left ventricular relaxation (grade 1 diastolic dysfunction).  ------------------------------------------------------------------- Aortic valve:  Poorly visualized.  Probably trileaflet; severely calcified leaflets.  Doppler:   There was severe stenosis.   There was mild regurgitation.    VTI ratio of LVOT to aortic valve:  0.2. Valve area (VTI): 0.69 cm^2. Indexed valve area (VTI): 0.32 cm^2/m^2. Peak velocity ratio of LVOT to aortic valve: 0.2. Valve area (Vmax): 0.7 cm^2. Indexed valve area (Vmax): 0.33 cm^2/m^2. Mean velocity ratio of LVOT to aortic valve: 0.2. Valve area (Vmean): 0.7 cm^2. Indexed valve area (Vmean): 0.33 cm^2/m^2. Mean gradient (S): 41 mm Hg. Peak gradient (S): 67 mm Hg.  ------------------------------------------------------------------- Aorta:  Aortic root: The aortic root was normal in size. Ascending aorta: The ascending aorta was normal in size.  ------------------------------------------------------------------- Mitral valve:   Mildly calcified annulus. Mildly calcified leaflets .  Doppler:   There was no evidence for stenosis.   There was trivial regurgitation.    Peak gradient (D): 3 mm Hg.  ------------------------------------------------------------------- Left atrium:  The atrium was moderately dilated.  ------------------------------------------------------------------- Right ventricle:  The cavity size was normal. Systolic function was mildly reduced.  ------------------------------------------------------------------- Pulmonic valve:    Structurally normal valve.   Cusp separation was normal.  Doppler:  Transvalvular velocity was within the normal range. There was no regurgitation.  ------------------------------------------------------------------- Tricuspid valve:   Doppler:  There was trivial regurgitation.   ------------------------------------------------------------------- Right atrium:  The atrium was normal in size.  ------------------------------------------------------------------- Pericardium:  There was no pericardial effusion.  ------------------------------------------------------------------- Systemic veins: Inferior vena cava: The vessel was normal in size. The respirophasic diameter changes were in the normal range (>=  50%), consistent with normal central venous pressure.  STS RISK CALCULATOR: Risk of Mortality: 3.444% Renal Failure: 2.348% Permanent Stroke: 1.757% Prolonged Ventilation: 12.970% DSW Infection: 0.498% Reoperation: 3.153% Morbidity or Mortality: 16.851% Short Length of Stay: 25.156% Long Length of Stay: 8.575%  ASSESSMENT AND PLAN: 77 yo woman with extensive coronary and peripheral arterial disease who now presents with NYHA functional class 3 symptoms of chronic diastolic heart failure in the setting of severe, Stage D1 degenerative aortic stenosis.   I have reviewed the natural history of aortic stenosis with the patient  today. We have discussed the limitations of medical therapy and the poor prognosis associated with symptomatic aortic stenosis. We have reviewed potential treatment options, including palliative medical therapy, conventional surgical aortic valve replacement, and transcatheter aortic valve replacement. We discussed treatment options in the context of the patient's specific comorbid medical conditions. Considering her 2 previous sternotomies, PAD, reduced functional capacity, and chronic lung disease,TAVR seems like the preferred treatment option for this patient as long as her anatomy is suitable for placement of a transcatheter heart valve. She understands that CTA studies of the heart and peripheral vasculature will be required to determine the technical feasibility of TAVR. We discussed potential access issues related to her PAD and alternative approaches that might be required. She does have a palpable left femoral pulse but at this point it is unclear if her left iliofemoral arterial anatomy will allow for adequate access for TAVR.   I have personally reviewed her cath and echo images, demonstrating findings clearly consistent with severe aortic stenosis (mean gradient 41 mmHg, DI < 0.25). Coronary bypasses to the LAD and circumflex branches remain patent with a  collateralized RCA.  Next steps will be to complete CTA studies, other pre-TAVR studies such as PFT's and PT assessment. A carotid duplex from 2018 showed <  40% stenosis of the bilateral carotids and doesn't need to be repeated. She will then undergo cardiac surgical evaluation as part of the multidisciplinary valve team evaluation.   Current medicines are reviewed with the patient today.  The patient does not have concerns regarding medicines.  Labs/ tests ordered today include:  No orders of the defined types were placed in this encounter.   Disposition:   FU pending further test results  Signed, Sherren Mocha, MD  04/09/2017 11:26 AM    [facility] [address], [place], [place]  [address] Phone: [phone]; Fax: [phone]

## 2017-04-07 NOTE — Progress Notes (Signed)
Pre TAVR Eval orders placed.

## 2017-04-16 NOTE — Progress Notes (Signed)
[name] Date of Birth: [birth year]   History of Present Illness: [name] is seen today for follow up CAD, AS, and PAD. She has known CAD with redo CABG in 2001.  She has a known abdominal aortic aneurysm with last ultrasound  showing a  3.1 cm aneurysm in August 2018. She has a history of PAD with right iliac occlusion. Last ABIs in August 2018 showed stable right ABI of 0.75.  She has chronic claudication symptoms with pain in right thigh with walking for long distances. This has not changed.[name] Her last nuclear stress test in January 2016 showed  anterior wall ischemia. This was unchanged from 2012 and 2013. She had a cardiac cath in 2008 which  showed patent grafts.   She has  lymphocytic colitis. Followed by Dr. Gala Romney. This flared last Spring and she was placed on Entecort. Following this she developed edema and elevated BP. She states she felt dizzy, HA, and "crazy". She was started on Avapro. Symptoms resolved after she came off steroids.   More recently she presented with increased dyspnea on exertion. By Echo she had progression of her Aortic stenosis to the severe range. Cardiac cath was performed confirming severe AS. Bypass grafts were patent. She had mild Pulmonary HTN and mildly elevated filling pressures. She has been referred to the valve team for evaluation for possible TAVR.   On follow up today she reports she is doing pretty well. Still feels fatigued.  BP is under good control.  Denies any  chest pain.  Still has SOB with exertion. No dizziness or palpitations. Scheduled for CT tomorrow.   Current Outpatient Medications on File Prior to Visit  Medication Sig Dispense Refill  . acetaminophen (TYLENOL) 500 MG tablet Take 1,000 mg by mouth every 6 (six) hours as needed for moderate pain or headache.     . ALPRAZolam (XANAX) 0.25 MG tablet Take 0.25 mg by mouth 3 (three) times daily as needed for anxiety.  0  . aspirin EC 81 MG tablet Take 81 mg by mouth at bedtime.     .  chlorthalidone (HYGROTON) 25 MG tablet Take 1/2 tablet ( 12.5 mg ) daily (Patient taking differently: Take 12.5 mg by mouth daily. ) 30 tablet 6  . FLUoxetine (PROZAC) 40 MG capsule TAKE ONE CAPSULE BY MOUTH EVERY DAY (Patient taking differently: Take 40 mg by mouth in the evening) 90 capsule 1  . furosemide (LASIX) 20 MG tablet TAKE 1 TABLET BY MOUTH EVERY DAY AS NEEDED FOR EDEMA (Patient taking differently: TAKE 20 MG BY MOUTH EVERY DAY AS NEEDED FOR EDEMA) 30 tablet 11  . irbesartan (AVAPRO) 300 MG tablet TAKE 1 TABLET BY MOUTH EVERY DAY (Patient taking differently: Take 300 mg by mouth every day) 30 tablet 4  . levothyroxine (SYNTHROID, LEVOTHROID) 50 MCG tablet TAKE 1 TABLET BY MOUTH EVERY DAY BEFORE BREAKFAST TAKE AN EXTRA 1/2 TAB ONE TIME EACH WEEK (Patient taking differently: Take 50 mcg by mouth every morning before breakfast) 96 tablet 4  . loperamide (IMODIUM) 2 MG capsule Take 2 mg by mouth as needed for diarrhea or loose stools. Reported on 05/01/2015    . Nebivolol HCl (BYSTOLIC) 20 MG TABS Take 1 tablet (20 mg total) by mouth daily. (Patient taking differently: Take 20 mg by mouth every evening. ) 90 tablet 2  . nitroGLYCERIN (NITROSTAT) 0.4 MG SL tablet Place 0.4 mg under the tongue every 5 (five) minutes as needed for chest pain (MAX 3 TABLETS).    [name]  pantoprazole (PROTONIX) 40 MG tablet Take 1 tablet (40 mg total) by mouth daily. (Patient taking differently: Take 40 mg by mouth every evening. ) 30 tablet 11  . PROAIR HFA 108 (90 Base) MCG/ACT inhaler Inhale 2 puffs into the lungs every 4 (four) hours as needed for shortness of breath or wheezing.  2  . simvastatin (ZOCOR) 40 MG tablet TAKE 1 TABLET OP AT BEDTIME (Patient taking differently: Take 40 mg by mouth at bedtime) 90 tablet 2  . SYMBICORT 160-4.5 MCG/ACT inhaler Inhale 1 puff into the lungs 2 (two) times daily.  2  . amLODipine (NORVASC) 10 MG tablet Take 1 tablet (10 mg total) by mouth daily. 30 tablet 6   No current  facility-administered medications on file prior to visit.     Allergies  Allergen Reactions  . Dipyridamole Hives  . Chantix [Varenicline Tartrate] Hives    Past Medical History:  Diagnosis Date  . Abdominal aortic aneurysm ([place]) 2/12   infrarenal diagnosed with maximum measurement at 3.3 cm  . Depression   . Diverticulosis   . GERD (gastroesophageal reflux disease)   . History of tobacco abuse    Recurrent cigarette smoking  . Hyperlipidemia   . Hyperplastic colon polyp   . Hypertension   . Hypothyroidism   . Ischemic heart disease    had redo bypass surgery in 2001, at that time she had a free right internal mammary to the LAD, LAD endarterectomy, sequential vein graft to the OM1 and OM2 -- last catheterization was in 2008  . Leg fracture    remote right leg fracture  . Microscopic colitis   . Obesity    with gastric bypass surgery in 2004  . PAD (peripheral artery disease) ([place]) 04/13/2011   right iliac occlusion  . PONV (postoperative nausea and vomiting)     Past Surgical History:  Procedure Laterality Date  . CARDIAC CATHETERIZATION  02/16/1999   Left heart catheterization with selective coronary  angiography, left ventricular angiography (RAO and LAO views), angiography of left internal mammary artery, and angiography of saphenous vein grafts  . CARDIAC CATHETERIZATION  10/20/2006   Est. EF of 55% -- Totally occuladed native coronary circulation -- Persistent patency of the right mammary graft and the distal LAD artery with peristent patency of the left mammary graft to the proximal LAD artery with persistant patency of the saphenous vein graft to the sequentail branches of the OM        . CARPAL TUNNEL RELEASE    . CHOLECYSTECTOMY    . COLONOSCOPY  07/12/2007   Dr.Brodie- normal cecum and rectum, diverticulosis in the sigmoid colon  . COLONOSCOPY  1991   per Dr.Brodie office visit note= hyperplastic polyp  . COLONOSCOPY N/A 09/30/2014   Dr.Rourk- noraml appearing  rectal mucosa, scattered L sided diverticula, colonic mucosa has a somewhat pale friable appearence diffusely, no ulcers or erosions seen, the distal 10cm of terminal ileum appeared entirely normal. bx=benign colorectal mucosa with lymphocytic colitis  . CORONARY ARTERY BYPASS GRAFT  1985  . CORONARY ARTERY BYPASS GRAFT  03/09/1999   Redo CABG x3 --  with the free right internal mammary artey to the LAD with an LAD endarterectomy -- Sequential saphenous vein graft to OM1 and OM2                   . ESOPHAGOGASTRODUODENOSCOPY N/A 09/30/2014   Dr.Rourk-mild erosive reflux esophagitis s/p bariatric surgery  . GASTRIC BYPASS  2004  . RIGHT/LEFT HEART CATH  AND CORONARY/GRAFT ANGIOGRAPHY N/A 04/04/2017   Procedure: RIGHT/LEFT HEART CATH AND CORONARY/GRAFT ANGIOGRAPHY;  Surgeon: Martinique, Peter M, MD;  Location: [facility];  Service: Cardiovascular;  Laterality: N/A;    Social History   Tobacco Use  Smoking Status Current Every Day Smoker  . Packs/day: 0.25  . Types: Cigarettes  . Start date: 01/10/1985  . Last attempt to quit: 02/11/2016  . Years since quitting: 1.1  Smokeless Tobacco Never Used  Tobacco Comment   smokes 1-2 cigarettes a day    Social History   Substance and Sexual Activity  Alcohol Use No  . Alcohol/week: 0.0 oz    Family History  Problem Relation Age of Onset  . Stroke Father   . Diabetes Mother   . Pancreatic cancer Mother     Review of Systems: The review of systems is as above.  All other systems were reviewed and are negative.  Physical Exam: BP 100/60 (BP Location: Left Arm, Patient Position: Sitting, Cuff Size: Normal)   Pulse 68   Ht 5' 7.5" (1.715 m)   Wt 212 lb (96.2 kg)   BMI 32.71 kg/m  GENERAL:  Obese WF in NAD HEENT:  PERRL, EOMI, sclera are clear. Oropharynx is clear. NECK:  No jugular venous distention, carotid upstroke brisk and symmetric, bilateral  bruits, no thyromegaly or adenopathy LUNGS:  Clear to auscultation bilaterally CHEST:   Unremarkable HEART:  RRR,  PMI not displaced or sustained,S1 and S2 within normal limits, no S3, no S4: no clicks, no rubs, harsh gr 4-0/9 systolic murmur RUSB radiating to LSB ABD:  Soft, nontender. BS +, no masses or bruits. No hepatomegaly, no splenomegaly EXT: pulses in feet not palpable, feet warm and dry. no edema, no cyanosis no clubbing SKIN:  Warm and dry.  No rashes NEURO:  Alert and oriented x 3. Cranial nerves II through XII intact. PSYCH:  Cognitively intact     LABORATORY DATA: Lab Results  Component Value Date   CHOL 149 02/04/2014   HDL 60 02/04/2014   LDLCALC 64 02/04/2014   TRIG 124 02/04/2014   CHOLHDL 2.5 02/04/2014   Lab Results  Component Value Date   WBC 6.4 03/31/2017   HGB 13.4 03/31/2017   HCT 40.5 03/31/2017   PLT 212 03/31/2017   GLUCOSE 98 03/31/2017   CHOL 149 02/04/2014   TRIG 124 02/04/2014   HDL 60 02/04/2014   LDLCALC 64 02/04/2014   ALT 13 07/07/2016   AST 18 07/07/2016   NA 139 03/31/2017   K 4.9 03/31/2017   CL 102 03/31/2017   CREATININE 1.00 03/31/2017   BUN 17 03/31/2017   CO2 23 03/31/2017   TSH 3.180 07/07/2016   INR 1.0 03/31/2017   Labs dated 10/05/15: glucose 108. Otherwise CMET is normal. Cholesterol 130, triglycerides 114, HDL 63, LDL 44. TSH normal.  Dated 11/02/16: cholesterol 138, triglycerides 78, LDL 49, HDL 73. CMET and CBC normal.  Echo 04/19/16: Study Conclusions  - Left ventricle: The cavity size was normal. Wall thickness was   normal. Systolic function was normal. The estimated ejection   fraction was in the range of 60% to 65%. Wall motion was normal;   there were no regional wall motion abnormalities. Doppler   parameters are consistent with pseudonormal left ventricular   relaxation (grade 2 diastolic dysfunction). The E/e&' ratio is   >15, suggesting elevated LV filling pressure. - Aortic valve: Calcified leaflets. Moderate stenosis. Mean   gradient (S): 35 mm Hg. Peak gradient (  S): 61 mm Hg. Valve  area   (VTI): 0.98 cm^2. Valve area (Vmax): 1.05 cm^2. Valve area   (Vmean): 0.98 cm^2. - Mitral valve: Mildly thickened leaflets . There was mild   regurgitation. - Left atrium: The atrium was moderately dilated. - Right ventricle: The cavity size was mildly dilated. Systolic   function is mildly reduced. - Tricuspid valve: There was mild regurgitation. - Pulmonary arteries: PA peak pressure: 34 mm Hg (S). - Inferior vena cava: The vessel was normal in size. The   respirophasic diameter changes were in the normal range (>= 50%),   consistent with normal central venous pressure.  Impressions:  - Compared to a prior study in 2012, there is now moderate aortic   stenosis - AVA around 1.05 cm2 with mean gradient of 35 mmHg.  Echo 03/29/17: Study Conclusions  - Left ventricle: The cavity size was normal. Wall thickness was   increased in a pattern of mild LVH. Systolic function was normal.   The estimated ejection fraction was in the range of 55% to 60%.   Wall motion was normal; there were no regional wall motion   abnormalities. Doppler parameters are consistent with abnormal   left ventricular relaxation (grade 1 diastolic dysfunction). - Aortic valve: Poorly visualized. Probably trileaflet; severely   calcified leaflets. There was severe stenosis. There was mild   regurgitation. Mean gradient (S): 41 mm Hg. Peak gradient (S): 67   mm Hg. Valve area (VTI): 0.69 cm^2. - Mitral valve: Mildly calcified annulus. Mildly calcified leaflets   . There was trivial regurgitation. - Left atrium: The atrium was moderately dilated. - Right ventricle: The cavity size was normal. Systolic function   was mildly reduced. - Tricuspid valve: Peak RV-RA gradient (S): 32 mm Hg. - Pulmonary arteries: PA peak pressure: 35 mm Hg (S). - Inferior vena cava: The vessel was normal in size. The   respirophasic diameter changes were in the normal range (>= 50%),   consistent with normal central venous  pressure.  Impressions:  - Normal LV size with mild LV hypertrophy. EF 55-60%. Severe aortic   stenosis, mild aortic insufficiency. Moderate left atrial   enlargement. Normal RV size with mildly decreased systolic   function.  Cardiac cath 04/04/17:  RIGHT/LEFT HEART CATH AND CORONARY/GRAFT ANGIOGRAPHY  Conclusion     Ost LAD to Prox LAD lesion is 100% stenosed.  Prox LAD to Mid LAD lesion is 100% stenosed.  Ost Cx to Mid Cx lesion is 100% stenosed.  Prox RCA to Mid RCA lesion is 100% stenosed.  LIMA graft was visualized by angiography and is normal in caliber.  The graft exhibits no disease.  Seq SVG- OM 1 and OM 2 graft was visualized by angiography and is large.  SVG graft was visualized by angiography and is normal in caliber.  The graft exhibits no disease.  Dist Graft lesion is 30% stenosed.  Origin lesion is 35% stenosed.  There is severe aortic valve stenosis.  LV end diastolic pressure is mildly elevated.  Hemodynamic findings consistent with mild pulmonary hypertension.   1. Severe 3 vessel occlusive CAD 2. Patent LIMA to the mid LAD. This only supplies a septal perforator.  3. Patent SVG to mid to distal LAD 4. Patent sequential SVG to OM 1 and OM 2 5. Severe Aortic stenosis. Mean gradient 32 mm Hg, peak gradient 35 mm Hg. Valve area 1.1 cm squared. Index 0.56 6. Normal cardiac output 7. Mild pulmonary HTN with mildly elevated LV filling  pressures.   Plan: Coronary circulation is unchanged from 2008. Patient has progressive symptomatic aortic stenosis. Will refer for TAVR evaluation.      Assessment / Plan: 1. Coronary disease status post CABG. Redo coronary bypass surgery in 2001 with sequential saphenous vein graft to the first and second obtuse marginal vessels and a free RIMA graft to the LAD. Prior LIMA graft to the proximal LAD. Myoview study January 2016 was stable showing anterior ischemia similar to prior studies.  Cardiac cath in  March 2019 showed patent grafts. No significant angina. Continue aspirin, beta blocker, and statin therapy.   2. PAD with right iliac occlusion. Stable claudication. Dopplers in August 2018 showed stable ABI of 0.75 on the right and normal on the left. No change from prior.  3. Abdominal aortic aneurysm measuring 3.1 cm. Stable August 2018.   4. Hypertension- improved control off steroids. Will continue current therapy.  5. Tobacco abuse-encouraged complete cessation.  6. Obesity.  7. Lymphocytic colitis. Per Dr. Gala Romney.  8. Aortic stenosis- now severe and symptomatic. Hopefully she will be able to have TAVR since surgical risk would be high. Evaluation is proceeding   9. Carotid arterial disease- no significant stenosis.   I will follow up in 3 months.

## 2017-04-17 ENCOUNTER — Ambulatory Visit: Payer: Medicare Other | Admitting: Cardiology

## 2017-04-17 ENCOUNTER — Encounter: Payer: Self-pay | Admitting: Cardiology

## 2017-04-17 VITALS — BP 100/60 | HR 68 | Ht 67.5 in | Wt 212.0 lb

## 2017-04-17 DIAGNOSIS — I739 Peripheral vascular disease, unspecified: Secondary | ICD-10-CM

## 2017-04-17 DIAGNOSIS — I714 Abdominal aortic aneurysm, without rupture, unspecified: Secondary | ICD-10-CM

## 2017-04-17 DIAGNOSIS — I350 Nonrheumatic aortic (valve) stenosis: Secondary | ICD-10-CM | POA: Diagnosis not present

## 2017-04-17 DIAGNOSIS — I2510 Atherosclerotic heart disease of native coronary artery without angina pectoris: Secondary | ICD-10-CM

## 2017-04-17 NOTE — Patient Instructions (Signed)
Good luck with your TAVR evaluation  I will see you in 3 months

## 2017-04-18 ENCOUNTER — Ambulatory Visit (HOSPITAL_COMMUNITY)
Admission: RE | Admit: 2017-04-18 | Discharge: 2017-04-18 | Disposition: A | Discharge status: Home / Self Care | Payer: Medicare Other | Source: Ambulatory Visit | Attending: Cardiovascular Disease | Admitting: Cardiovascular Disease

## 2017-04-18 ENCOUNTER — Other Ambulatory Visit: Payer: Self-pay

## 2017-04-18 ENCOUNTER — Encounter (HOSPITAL_COMMUNITY): Payer: Self-pay

## 2017-04-18 ENCOUNTER — Institutional Professional Consult (permissible substitution): Payer: Medicare Other | Admitting: Thoracic Surgery (Cardiothoracic Vascular Surgery)

## 2017-04-18 ENCOUNTER — Encounter: Payer: Self-pay | Admitting: Thoracic Surgery (Cardiothoracic Vascular Surgery)

## 2017-04-18 VITALS — BP 97/56 | HR 74 | Resp 16 | Ht 67.5 in | Wt 212.0 lb

## 2017-04-18 DIAGNOSIS — N2889 Other specified disorders of kidney and ureter: Secondary | ICD-10-CM | POA: Insufficient documentation

## 2017-04-18 DIAGNOSIS — K5730 Diverticulosis of large intestine without perforation or abscess without bleeding: Secondary | ICD-10-CM | POA: Diagnosis not present

## 2017-04-18 DIAGNOSIS — I2510 Atherosclerotic heart disease of native coronary artery without angina pectoris: Secondary | ICD-10-CM | POA: Diagnosis not present

## 2017-04-18 DIAGNOSIS — Z951 Presence of aortocoronary bypass graft: Secondary | ICD-10-CM | POA: Diagnosis not present

## 2017-04-18 DIAGNOSIS — R0602 Shortness of breath: Secondary | ICD-10-CM | POA: Diagnosis present

## 2017-04-18 DIAGNOSIS — I350 Nonrheumatic aortic (valve) stenosis: Secondary | ICD-10-CM

## 2017-04-18 DIAGNOSIS — I700 Atherosclerosis of aorta: Secondary | ICD-10-CM | POA: Diagnosis not present

## 2017-04-18 DIAGNOSIS — I714 Abdominal aortic aneurysm, without rupture: Secondary | ICD-10-CM | POA: Diagnosis not present

## 2017-04-18 DIAGNOSIS — I745 Embolism and thrombosis of iliac artery: Secondary | ICD-10-CM | POA: Insufficient documentation

## 2017-04-18 DIAGNOSIS — Z9889 Other specified postprocedural states: Secondary | ICD-10-CM | POA: Diagnosis not present

## 2017-04-18 DIAGNOSIS — I358 Other nonrheumatic aortic valve disorders: Secondary | ICD-10-CM | POA: Diagnosis not present

## 2017-04-18 HISTORY — DX: Presence of aortocoronary bypass graft: Z95.1

## 2017-04-18 LAB — PULMONARY FUNCTION TEST
DL/VA % PRED: 93 %
DL/VA: 4.84 ml/min/mmHg/L
DLCO unc % pred: 46 %
DLCO unc: 13.44 ml/min/mmHg
FEF 25-75 PRE: 1.56 L/s
FEF 25-75 Post: 2.03 L/sec
FEF2575-%CHANGE-POST: 30 %
FEF2575-%PRED-PRE: 82 %
FEF2575-%Pred-Post: 107 %
FEV1-%CHANGE-POST: 11 %
FEV1-%PRED-PRE: 55 %
FEV1-%Pred-Post: 62 %
FEV1-Post: 1.53 L
FEV1-Pre: 1.37 L
FEV1FVC-%Change-Post: 0 %
FEV1FVC-%Pred-Pre: 113 %
FEV6-%CHANGE-POST: 12 %
FEV6-%PRED-POST: 58 %
FEV6-%Pred-Pre: 51 %
FEV6-PRE: 1.62 L
FEV6-Post: 1.82 L
FEV6FVC-%Pred-Post: 105 %
FEV6FVC-%Pred-Pre: 105 %
FVC-%Change-Post: 12 %
FVC-%PRED-POST: 55 %
FVC-%Pred-Pre: 49 %
FVC-Post: 1.82 L
FVC-Pre: 1.62 L
POST FEV1/FVC RATIO: 84 %
POST FEV6/FVC RATIO: 100 %
PRE FEV6/FVC RATIO: 100 %
Pre FEV1/FVC ratio: 85 %
RV % PRED: 58 %
RV: 1.43 L
TLC % PRED: 54 %
TLC: 3.06 L

## 2017-04-18 LAB — POCT I-STAT CREATININE: Creatinine, Ser: 0.6 mg/dL (ref 0.44–1.00)

## 2017-04-18 MED ORDER — IOPAMIDOL (ISOVUE-370) INJECTION 76%
INTRAVENOUS | Status: AC
  Administered 2017-04-18: 100 mL
  Filled 2017-04-18: qty 100

## 2017-04-18 MED ORDER — ALBUTEROL SULFATE (2.5 MG/3ML) 0.083% IN NEBU
2.5000 mg | INHALATION_SOLUTION | Freq: Once | RESPIRATORY_TRACT | Status: AC
  Administered 2017-04-18: 2.5 mg via RESPIRATORY_TRACT

## 2017-04-18 NOTE — Patient Instructions (Signed)
Continue all previous medications without any changes at this time  

## 2017-04-18 NOTE — Progress Notes (Signed)
HEART AND [place] SURGERY CONSULTATION REPORT  Referring Provider is Martinique, Peter M, MD PCP is [name], MD  Chief Complaint  Patient presents with  . 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  The patient's cardiac history dates back to 1985 when she underwent coronary artery bypass grafting x4 by Dr. Redmond Pulling.  She did well and was followed by Dr. Doreatha Lew for many years.  In 2001 she underwent redo coronary artery bypass grafting x3 by Dr. Servando Snare.  She recovered uneventfully and has continued to do well from the standpoint of ischemic heart disease.  She developed heart murmur on physical exam and was noted to have aortic stenosis which has gradually progressed in severity over the last several years.  Over the past year or so she has experienced significant progression of symptoms of exertional shortness of breath and fatigue.  Follow-up echocardiogram performed March 29, 2017 revealed normal left ventricular systolic function with severe aortic stenosis.  Peak velocity across the aortic valve measured 4.1 m/s corresponding to mean transvalvular gradient estimated 39 mmHg.  The DVI was reported 0.20 with aortic valve area calculated 0.69 cm.  Left ventricular ejection fraction was estimated 55-60%.  Left and right heart catheterization performed April 04, 2017 confirmed the presence of severe aortic  stenosis with peak to peak and mean transvalvular gradients measured 35 and 32 mmHg, respectively, corresponding to aortic valve area calculated 1.1 cm.  There was severe native three-vessel coronary artery disease with continued patency of left internal mammary artery graft to the mid left anterior descending coronary artery, patent free right internal mammary artery graft to the distal left anterior descending coronary artery, and patient sequential saphenous vein graft to the first and second obtuse marginal branches respectively.  There was mild pulmonary hypertension.  The patient was referred to Dr. Burt Knack and underwent CT angiography.  Surgical consultation was requested.  The patient is married and lives locally in [place].  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  She has remained reasonably active and entirely functionally independent throughout her adult life.  She admits that she has slowed down a fair amount over the past couple of years.  She states that over the past 6 months to 1 year she has developed progressive fatigue with exertional shortness of breath and poor exercise tolerance.  She is also somewhat limited by chronic pain in her right knee and claudication in the right calf.  However, she ambulates without need for mechanical assistance.  She still drives an automobile and takes care of herself.  She states that she now gets short of breath with moderate and low-level activity.  She has had a couple of episodes of shortness of breath at night which are relieved by sitting up.  She sleeps on one pillow.  She has had some mild lower extremity edema and she has gained at least 12 pounds over the past 6 months, which she  attributes to fluid.  She denies any history of chest pain or chest tightness either with activity or at rest.  She has not had dizzy spells or syncope.   Past Medical History:  Diagnosis Date  .  Abdominal aortic aneurysm ([place])    infrarenal diagnosed with maximum measurement at 3.3 cm  . Depression   . Diverticulosis   . GERD (gastroesophageal reflux disease)   . History of tobacco abuse    Recurrent cigarette smoking  . Hyperlipidemia   . Hyperplastic colon polyp   . Hypertension   . Hypothyroidism   . Ischemic heart disease    had redo bypass surgery in 2001, at that time she had a free right internal mammary to the LAD, LAD endarterectomy, sequential vein graft to the OM1 and OM2 -- last catheterization was in 2008  . Leg fracture    remote right leg fracture  . Microscopic colitis   . Obesity    with gastric bypass surgery in 2004  . PAD (peripheral artery disease) (H[facility])    right iliac occlusion  . PONV (postoperative nausea and vomiting)   . S/P CABG x 4 1985   LIMA to LAD, Sequential SVG to OM1-OM2, SVG to RCA - Dr Redmond Pulling  . S/P redo CABG x 3 2001   Free RIMA to LAD with endarterectomy, sequential SVG to OM1-OM2 - Dr Servando Snare    Past Surgical History:  Procedure Laterality Date  . CARDIAC CATHETERIZATION  02/16/1999   Left heart catheterization with selective coronary  angiography, left ventricular angiography (RAO and LAO views), angiography of left internal mammary artery, and angiography of saphenous vein grafts  . CARDIAC CATHETERIZATION  10/20/2006   Est. EF of 55% -- Totally occuladed native coronary circulation -- Persistent patency of the right mammary graft and the distal LAD artery with peristent patency of the left mammary graft to the proximal LAD artery with persistant patency of the saphenous vein graft to the sequentail branches of the OM        . CARPAL TUNNEL RELEASE    . CHOLECYSTECTOMY    . COLONOSCOPY  07/12/2007   Dr.Brodie- normal cecum and rectum, diverticulosis in the sigmoid colon  . COLONOSCOPY  1991   per Dr.Brodie office visit note= hyperplastic polyp  . COLONOSCOPY N/A 09/30/2014   Dr.Rourk- noraml appearing rectal mucosa, scattered L  sided diverticula, colonic mucosa has a somewhat pale friable appearence diffusely, no ulcers or erosions seen, the distal 10cm of terminal ileum appeared entirely normal. bx=benign colorectal mucosa with lymphocytic colitis  . CORONARY ARTERY BYPASS GRAFT  1985  . CORONARY ARTERY BYPASS GRAFT  03/09/1999   Redo CABG x3 --  with the free right internal mammary artey to the LAD with an LAD endarterectomy -- Sequential saphenous vein graft to OM1 and OM2                   . ESOPHAGOGASTRODUODENOSCOPY N/A 09/30/2014   Dr.Rourk-mild erosive reflux esophagitis s/p bariatric surgery  . GASTRIC BYPASS  2004  . RIGHT/LEFT HEART CATH AND CORONARY/GRAFT ANGIOGRAPHY N/A 04/04/2017   Procedure: RIGHT/LEFT HEART CATH AND CORONARY/GRAFT ANGIOGRAPHY;  Surgeon: Martinique, Peter M, MD;  Location: [facility];  Service: Cardiovascular;  Laterality: N/A;    Family History  Problem Relation Age of Onset  . Stroke Father   . Diabetes Mother   . Pancreatic cancer Mother     Social History   Socioeconomic History  . Marital status: Married  Spouse name: Not on file  . Number of children: Not on file  . Years of education: Not on file  . Highest education level: Not on file  Occupational History  . Occupation: reitred    Fish farm manager: [employer]  Social Needs  . Financial resource strain: Not on file  . Food insecurity:    Worry: Not on file    Inability: Not on file  . Transportation needs:    Medical: Not on file    Non-medical: Not on file  Tobacco Use  . Smoking status: Current Every Day Smoker    Packs/day: 0.25    Types: Cigarettes    Start date: 01/10/1985    Last attempt to quit: 02/11/2016    Years since quitting: 1.1  . Smokeless tobacco: Never Used  . Tobacco comment: smokes 1-2 cigarettes a day  Substance and Sexual Activity  . Alcohol use: No    Alcohol/week: 0.0 oz  . Drug use: No  . Sexual activity: Never  Lifestyle  . Physical activity:    Days per week: Not on  file    Minutes per session: Not on file  . Stress: Not on file  Relationships  . Social connections:    Talks on phone: Not on file    Gets together: Not on file    Attends religious service: Not on file    Active member of club or organization: Not on file    Attends meetings of clubs or organizations: Not on file    Relationship status: Not on file  . Intimate partner violence:    Fear of current or ex partner: Not on file    Emotionally abused: Not on file    Physically abused: Not on file    Forced sexual activity: Not on file  Other Topics Concern  . Not on file  Social History Narrative  . Not on file    Current Outpatient Medications  Medication Sig Dispense Refill  . acetaminophen (TYLENOL) 500 MG tablet Take 1,000 mg by mouth every 6 (six) hours as needed for moderate pain or headache.     . ALPRAZolam (XANAX) 0.25 MG tablet Take 0.25 mg by mouth 3 (three) times daily as needed for anxiety.  0  . amLODipine (NORVASC) 10 MG tablet Take 1 tablet (10 mg total) by mouth daily. 30 tablet 6  . aspirin EC 81 MG tablet Take 81 mg by mouth at bedtime.     . chlorthalidone (HYGROTON) 25 MG tablet Take 1/2 tablet ( 12.5 mg ) daily (Patient taking differently: Take 12.5 mg by mouth daily. ) 30 tablet 6  . FLUoxetine (PROZAC) 40 MG capsule TAKE ONE CAPSULE BY MOUTH EVERY DAY (Patient taking differently: Take 40 mg by mouth in the evening) 90 capsule 1  . furosemide (LASIX) 20 MG tablet TAKE 1 TABLET BY MOUTH EVERY DAY AS NEEDED FOR EDEMA (Patient taking differently: TAKE 20 MG BY MOUTH EVERY DAY AS NEEDED FOR EDEMA) 30 tablet 11  . irbesartan (AVAPRO) 300 MG tablet TAKE 1 TABLET BY MOUTH EVERY DAY (Patient taking differently: Take 300 mg by mouth every day) 30 tablet 4  . levothyroxine (SYNTHROID, LEVOTHROID) 50 MCG tablet TAKE 1 TABLET BY MOUTH EVERY DAY BEFORE BREAKFAST TAKE AN EXTRA 1/2 TAB ONE TIME EACH WEEK (Patient taking differently: Take 50 mcg by mouth every morning before  breakfast) 96 tablet 4  . loperamide (IMODIUM) 2 MG capsule Take 2 mg by mouth as needed for diarrhea  or loose stools. Reported on 05/01/2015    . Nebivolol HCl (BYSTOLIC) 20 MG TABS Take 1 tablet (20 mg total) by mouth daily. (Patient taking differently: Take 20 mg by mouth every evening. ) 90 tablet 2  . nitroGLYCERIN (NITROSTAT) 0.4 MG SL tablet Place 0.4 mg under the tongue every 5 (five) minutes as needed for chest pain (MAX 3 TABLETS).    . pantoprazole (PROTONIX) 40 MG tablet Take 1 tablet (40 mg total) by mouth daily. (Patient taking differently: Take 40 mg by mouth every evening. ) 30 tablet 11  . PROAIR HFA 108 (90 Base) MCG/ACT inhaler Inhale 2 puffs into the lungs every 4 (four) hours as needed for shortness of breath or wheezing.  2  . simvastatin (ZOCOR) 40 MG tablet TAKE 1 TABLET OP AT BEDTIME (Patient taking differently: Take 40 mg by mouth at bedtime) 90 tablet 2  . SYMBICORT 160-4.5 MCG/ACT inhaler Inhale 1 puff into the lungs 2 (two) times daily.  2   No current facility-administered medications for this visit.     Allergies  Allergen Reactions  . Dipyridamole Hives  . Chantix [Varenicline Tartrate] Hives      Review of Systems:   General:  normal appetite, decreased energy, + weight gain, no weight loss, no fever  Cardiac:  no chest pain with exertion, no chest pain at rest, +SOB with exertion, occasional resting SOB, + PND, + orthopnea, no palpitations, no arrhythmia, no atrial fibrillation, + LE edema, no dizzy spells, no syncope  Respiratory:  + shortness of breath, no home oxygen, no productive cough, no dry cough, no bronchitis, no wheezing, no hemoptysis, no asthma, no pain with inspiration or cough, no sleep apnea, no CPAP at night  GI:   no difficulty swallowing, no reflux, no frequent heartburn, no hiatal hernia, no abdominal pain, no constipation, no diarrhea, no hematochezia, no hematemesis, no melena  GU:   no dysuria,  no frequency, no urinary tract  infection, no hematuria, no kidney stones, no kidney disease  Vascular:  + pain suggestive of claudication in right calf, no pain in feet, no leg cramps, no varicose veins, no DVT, no non-healing foot ulcer  Neuro:   no stroke, no TIA's, no seizures, no headaches, no temporary blindness one eye,  no slurred speech, no peripheral neuropathy, + chronic pain, no instability of gait, no memory/cognitive dysfunction  Musculoskeletal: + arthritis, + joint swelling, no myalgias, minor difficulty walking, normal mobility   Skin:   no rash, no itching, no skin infections, no pressure sores or ulcerations  Psych:   no anxiety, no depression, no nervousness, no unusual recent stress  Eyes:   no blurry vision, no floaters, no recent vision changes, does not wears glasses or contacts  ENT:   no hearing loss, no loose or painful teeth, no dentures, last saw dentist 1 week ago  Hematologic:  + easy bruising, no abnormal bleeding, no clotting disorder, no frequent epistaxis  Endocrine:  no diabetes, does not check CBG's at home           Physical Exam:   BP (!) 97/56 (BP Location: Left Arm, Patient Position: Sitting, Cuff Size: Large)   Pulse 74   Resp 16   Ht 5' 7.5" (1.715 m)   Wt 212 lb (96.2 kg)   SpO2 98% Comment: ON RA  BMI 32.71 kg/m    General:  Moderately obese,  well-appearing  HEENT:  Unremarkable   Neck:   no JVD, no bruits, no  adenopathy   Chest:   clear to auscultation, symmetrical breath sounds, no wheezes, no rhonchi   CV:   RRR, grade III/VI crescendo/decrescendo murmur heard best at RSB,  no diastolic murmur  Abdomen:  soft, non-tender, no masses   Extremities:  warm, well-perfused, pulses diminished, no LE edema  Rectal/GU  Deferred  Neuro:   Grossly non-focal and symmetrical throughout  Skin:   Clean and dry, no rashes, no breakdown   Diagnostic Tests:  Transthoracic Echocardiography  Patient:    Elnor, Renovato MR #:       [id number] Study Date: 03/29/2017 Gender:      F Age:        35 Height:     167.6 cm Weight:     94.3 kg BSA:        2.13 m^2 Pt. Status: Room:   ATTENDING    Peter Martinique, M.D.  ORDERING     Peter Martinique, M.D.  REFERRING    Peter Martinique, M.D.  SONOGRAPHER  Cindy Hazy, RDCS  PERFORMING   [facility], Outpatient  cc:  ------------------------------------------------------------------- LV EF: 55% -   60%  ------------------------------------------------------------------- Indications:      I35.9 Aortic Valve Disorder.  ------------------------------------------------------------------- History:   PMH:  Acquired from the patient and from the patient&'s chart.  PMH:  AAA. Hypothyroidism. Ischemic heart disease. Peripheral artery disease.  Risk factors:  Current tobacco use. Hypertension. Obese. Dyslipidemia.  ------------------------------------------------------------------- Study Conclusions  - Left ventricle: The cavity size was normal. Wall thickness was   increased in a pattern of mild LVH. Systolic function was normal.   The estimated ejection fraction was in the range of 55% to 60%.   Wall motion was normal; there were no regional wall motion   abnormalities. Doppler parameters are consistent with abnormal   left ventricular relaxation (grade 1 diastolic dysfunction). - Aortic valve: Poorly visualized. Probably trileaflet; severely   calcified leaflets. There was severe stenosis. There was mild   regurgitation. Mean gradient (S): 41 mm Hg. Peak gradient (S): 67   mm Hg. Valve area (VTI): 0.69 cm^2. - Mitral valve: Mildly calcified annulus. Mildly calcified leaflets   . There was trivial regurgitation. - Left atrium: The atrium was moderately dilated. - Right ventricle: The cavity size was normal. Systolic function   was mildly reduced. - Tricuspid valve: Peak RV-RA gradient (S): 32 mm Hg. - Pulmonary arteries: PA peak pressure: 35 mm Hg (S). - Inferior vena cava: The vessel was normal in size. The    respirophasic diameter changes were in the normal range (>= 50%),   consistent with normal central venous pressure.  Impressions:  - Normal LV size with mild LV hypertrophy. EF 55-60%. Severe aortic   stenosis, mild aortic insufficiency. Moderate left atrial   enlargement. Normal RV size with mildly decreased systolic   function.  ------------------------------------------------------------------- Labs, prior tests, procedures, and surgery: Gastric Bypass-2004.  Coronary artery bypass grafting.  ------------------------------------------------------------------- Study data:   Study status:  Routine.  Procedure:  The patient reported no pain pre or post test. Transthoracic echocardiography for left ventricular function evaluation, for right ventricular function evaluation, and for assessment of valvular function. Image quality was adequate.  Study completion:  There were no complications.          Transthoracic echocardiography.  M-mode, complete 2D, spectral Doppler, and color Doppler.  Birthdate: Patient birthdate: Apr 11, 1940.  Age:  Patient is 77 yr old.  Sex: Gender: female.    BMI: 33.6 kg/m^2.  Blood pressure:  116/54 Patient status:  Outpatient.  Study date:  Study date: 03/29/2017. Study time: 10:02 AM.  Location:  [facility]  -------------------------------------------------------------------  ------------------------------------------------------------------- Left ventricle:  The cavity size was normal. Wall thickness was increased in a pattern of mild LVH. Systolic function was normal. The estimated ejection fraction was in the range of 55% to 60%. Wall motion was normal; there were no regional wall motion abnormalities. Doppler parameters are consistent with abnormal left ventricular relaxation (grade 1 diastolic dysfunction).  ------------------------------------------------------------------- Aortic valve:  Poorly visualized.  Probably trileaflet;  severely calcified leaflets.  Doppler:   There was severe stenosis.   There was mild regurgitation.    VTI ratio of LVOT to aortic valve: 0.2. Valve area (VTI): 0.69 cm^2. Indexed valve area (VTI): 0.32 cm^2/m^2. Peak velocity ratio of LVOT to aortic valve: 0.2. Valve area (Vmax): 0.7 cm^2. Indexed valve area (Vmax): 0.33 cm^2/m^2. Mean velocity ratio of LVOT to aortic valve: 0.2. Valve area (Vmean): 0.7 cm^2. Indexed valve area (Vmean): 0.33 cm^2/m^2. Mean gradient (S): 41 mm Hg. Peak gradient (S): 67 mm Hg.  ------------------------------------------------------------------- Aorta:  Aortic root: The aortic root was normal in size. Ascending aorta: The ascending aorta was normal in size.  ------------------------------------------------------------------- Mitral valve:   Mildly calcified annulus. Mildly calcified leaflets .  Doppler:   There was no evidence for stenosis.   There was trivial regurgitation.    Peak gradient (D): 3 mm Hg.  ------------------------------------------------------------------- Left atrium:  The atrium was moderately dilated.  ------------------------------------------------------------------- Right ventricle:  The cavity size was normal. Systolic function was mildly reduced.  ------------------------------------------------------------------- Pulmonic valve:    Structurally normal valve.   Cusp separation was normal.  Doppler:  Transvalvular velocity was within the normal range. There was no regurgitation.  ------------------------------------------------------------------- Tricuspid valve:   Doppler:  There was trivial regurgitation.   ------------------------------------------------------------------- Right atrium:  The atrium was normal in size.  ------------------------------------------------------------------- Pericardium:  There was no pericardial effusion.  ------------------------------------------------------------------- Systemic  veins: Inferior vena cava: The vessel was normal in size. The respirophasic diameter changes were in the normal range (>= 50%), consistent with normal central venous pressure.  ------------------------------------------------------------------- Measurements   Left ventricle                            Value          Reference  LV ID, ED, PLAX chordal                   43.7  mm       43 - 52  LV ID, ES, PLAX chordal                   25.7  mm       23 - 38  LV fx shortening, PLAX chordal            41    %        >=29  LV PW thickness, ED                       10.1  mm       ---------  IVS/LV PW ratio, ED               (H)     1.55           <=1.3  Stroke volume, 2D  71    ml       ---------  Stroke volume/bsa, 2D                     33    ml/m^2   ---------  LV e&', lateral                            7.99  cm/s     ---------  LV E/e&', lateral                          10.81          ---------  LV e&', medial                             6.58  cm/s     ---------  LV E/e&', medial                           13.13          ---------  LV e&', average                            7.29  cm/s     ---------  LV E/e&', average                          11.86          ---------    Ventricular septum                        Value          Reference  IVS thickness, ED                         15.7  mm       ---------    LVOT                                      Value          Reference  LVOT ID, S                                21    mm       ---------  LVOT area                                 3.46  cm^2     ---------  LVOT ID                                   21    mm       ---------  LVOT peak velocity, S                     82.7  cm/s     ---------  LVOT mean velocity, S  59.9  cm/s     ---------  LVOT VTI, S                               20.4  cm       ---------  LVOT peak gradient, S                     3     mm Hg    ---------  Stroke volume (SV), LVOT  DP               70.7  ml       ---------  Stroke index (SV/bsa), LVOT DP            33.1  ml/m^2   ---------    Aortic valve                              Value          Reference  Aortic valve peak velocity, S             409   cm/s     ---------  Aortic valve mean velocity, S             296   cm/s     ---------  Aortic valve VTI, S                       102   cm       ---------  Aortic mean gradient, S                   39    mm Hg    ---------  Aortic peak gradient, S                   67    mm Hg    ---------  VTI ratio, LVOT/AV                        0.2            ---------  Aortic valve area, VTI                    0.69  cm^2     ---------  Aortic valve area/bsa, VTI                0.32  cm^2/m^2 ---------  Velocity ratio, peak, LVOT/AV             0.2            ---------  Aortic valve area, peak velocity          0.7   cm^2     ---------  Aortic valve area/bsa, peak               0.33  cm^2/m^2 ---------  velocity  Velocity ratio, mean, LVOT/AV             0.2            ---------  Aortic valve area, mean velocity          0.7   cm^2     ---------  Aortic valve area/bsa, mean               0.33  cm^2/m^2 ---------  velocity  Aortic regurg pressure half-time          581   ms       ---------    Aorta                                     Value          Reference  Aortic root ID, ED                        37    mm       ---------    Left atrium                               Value          Reference  LA ID, A-P, ES                            55    mm       ---------  LA ID/bsa, A-P                    (H)     2.58  cm/m^2   <=2.2  LA volume, S                              90    ml       ---------  LA volume/bsa, S                          42.2  ml/m^2   ---------  LA volume, ES, 1-p A4C                    63    ml       ---------  LA volume/bsa, ES, 1-p A4C                29.6  ml/m^2   ---------  LA volume, ES, 1-p A2C                    107   ml       ---------  LA volume/bsa,  ES, 1-p A2C                50.2  ml/m^2   ---------    Mitral valve                              Value          Reference  Mitral E-wave peak velocity               86.4  cm/s     ---------  Mitral A-wave peak velocity               92.8  cm/s     ---------  Mitral deceleration time                  204   ms       150 - 230  Mitral peak gradient, D  3     mm Hg    ---------  Mitral E/A ratio, peak                    0.9            ---------    Pulmonary arteries                        Value          Reference  PA pressure, S, DP                (H)     35    mm Hg    <=30    Tricuspid valve                           Value          Reference  Tricuspid regurg peak velocity            281   cm/s     ---------  Tricuspid peak RV-RA gradient             32    mm Hg    ---------    Right ventricle                           Value          Reference  RV s&', lateral, S                         10.5  cm/s     ---------  Legend: (L)  and  (H)  mark values outside specified reference range.  ------------------------------------------------------------------- Prepared and Electronically Authenticated by  Loralie Champagne, M.D. 2019-03-20T14:05:32   RIGHT/LEFT HEART CATH AND CORONARY/GRAFT ANGIOGRAPHY  Conclusion     Ost LAD to Prox LAD lesion is 100% stenosed.  Prox LAD to Mid LAD lesion is 100% stenosed.  Ost Cx to Mid Cx lesion is 100% stenosed.  Prox RCA to Mid RCA lesion is 100% stenosed.  LIMA graft was visualized by angiography and is normal in caliber.  The graft exhibits no disease.  Seq SVG- OM 1 and OM 2 graft was visualized by angiography and is large.  SVG graft was visualized by angiography and is normal in caliber.  The graft exhibits no disease.  Dist Graft lesion is 30% stenosed.  Origin lesion is 35% stenosed.  There is severe aortic valve stenosis.  LV end diastolic pressure is mildly elevated.  Hemodynamic findings consistent with  mild pulmonary hypertension.   1. Severe 3 vessel occlusive CAD 2. Patent LIMA to the mid LAD. This only supplies a septal perforator.  3. Patent SVG to mid to distal LAD 4. Patent sequential SVG to OM 1 and OM 2 5. Severe Aortic stenosis. Mean gradient 32 mm Hg, peak gradient 35 mm Hg. Valve area 1.1 cm squared. Index 0.56 6. Normal cardiac output 7. Mild pulmonary HTN with mildly elevated LV filling pressures.   Plan: Coronary circulation is unchanged from 2008. Patient has progressive symptomatic aortic stenosis. Will refer for TAVR evaluation.   Indications   Nonrheumatic aortic valve stenosis [I35.0 (ICD-10-CM)]  Procedural Details/Technique   Technical Details Indication: 77 yo WF with history of CAD s/p redo CABG presents with progressive aortic stenosis.  Procedural Details: The left wrist was prepped,  draped, and anesthetized with 1% lidocaine. Using the modified Seldinger technique a 6 Fr slender sheath was placed in the left radial artery and a 5 French sheath was placed in the right brachial vein. A Swan-Ganz catheter was used for the right heart catheterization. Standard protocol was followed for recording of right heart pressures and sampling of oxygen saturations. Fick cardiac output was calculated. Standard Judkins catheters were used for selective coronary and graft angiography and left ventricular pressures. There were no immediate procedural complications. The patient was transferred to the post catheterization recovery area for further monitoring.  Contrast: 75 cc   Estimated blood loss <50 mL.  During this procedure the patient was administered the following to achieve and maintain moderate conscious sedation: Versed 1 mg, Fentanyl 25 mcg, while the patient's heart rate, blood pressure, and oxygen saturation were continuously monitored. The period of conscious sedation was 40 minutes, of which I was present face-to-face 100% of this time.  Complications    Complications documented before study signed (04/04/2017 9:59 AM EDT)    No complications were associated with this study.  Documented by Martinique, Peter M, MD - 04/04/2017 9:50 AM EDT    Coronary Findings   Diagnostic  Dominance: Right  Left Anterior Descending  Ost LAD to Prox LAD lesion 100% stenosed  Ost LAD to Prox LAD lesion is 100% stenosed.  Prox LAD to Mid LAD lesion 100% stenosed  Prox LAD to Mid LAD lesion is 100% stenosed.  Left Circumflex  Ost Cx to Mid Cx lesion 100% stenosed  Ost Cx to Mid Cx lesion is 100% stenosed.  Right Coronary Artery  Prox RCA to Mid RCA lesion 100% stenosed  Prox RCA to Mid RCA lesion is 100% stenosed.  Acute Marginal Branch  Collaterals  Acute Mrg filled by collaterals from 2nd Mrg.    Right Posterior Atrioventricular Branch  Collaterals  Post Atrio filled by collaterals from Dist LAD.    LIMA LIMA Graft to Prox LAD  LIMA graft was visualized by angiography and is normal in caliber. The graft exhibits no disease.  Sequential jump graft Graft to 1st Mrg, 2nd Mrg  Seq SVG- OM 1 and OM 2 graft was visualized by angiography and is large.  saphenous Graft to Mid LAD  SVG graft was visualized by angiography and is normal in caliber. The graft exhibits no disease.  Origin lesion 35% stenosed  Origin lesion is 35% stenosed. The lesion is eccentric.  Dist Graft lesion 30% stenosed  Dist Graft lesion is 30% stenosed.  Intervention   No interventions have been documented.  Right Heart   Right Heart Pressures Hemodynamic findings consistent with mild pulmonary hypertension.  Left Heart   Left Ventricle LV end diastolic pressure is mildly elevated.  Aortic Valve There is severe aortic valve stenosis. The aortic valve is calcified.  Coronary Diagrams   Diagnostic Diagram       Implants    No implant documentation for this case.  MERGE Images   Show images for CARDIAC CATHETERIZATION   Link to Procedure Log   Procedure Log     Hemo Data    Most Recent Value  Fick Cardiac Output 6.5 L/min  Fick Cardiac Output Index 3.17 (L/min)/BSA  Aortic Mean Gradient 31.3 mmHg  Aortic Peak Gradient 35 mmHg  Aortic Valve Area 1.14  Aortic Value Area Index 0.56 cm2/BSA  RA A Wave 10 mmHg  RA V Wave 10 mmHg  RA Mean 8 mmHg  RV Systolic Pressure 43  mmHg  RV Diastolic Pressure 2 mmHg  RV EDP 10 mmHg  PA Systolic Pressure 42 mmHg  PA Diastolic Pressure 16 mmHg  PA Mean 25 mmHg  PW A Wave 18 mmHg  PW V Wave 19 mmHg  PW Mean 14 mmHg  AO Systolic Pressure 357 mmHg  AO Diastolic Pressure 58 mmHg  AO Mean 84 mmHg  LV Systolic Pressure 017 mmHg  LV Diastolic Pressure 7 mmHg  LV EDP 22 mmHg  Arterial Occlusion Pressure Extended Systolic Pressure 793 mmHg  Arterial Occlusion Pressure Extended Diastolic Pressure 58 mmHg  Arterial Occlusion Pressure Extended Mean Pressure 82 mmHg  Left Ventricular Apex Extended Systolic Pressure 903 mmHg  Left Ventricular Apex Extended Diastolic Pressure 10 mmHg  Left Ventricular Apex Extended EDP Pressure 22 mmHg  QP/QS 1  TPVR Index 7.88 HRUI  TSVR Index 26.48 HRUI  PVR SVR Ratio 0.14  TPVR/TSVR Ratio 0.3    Cardiac TAVR CT  TECHNIQUE: The patient was scanned on a [employer] 192 scanner. A 120 kV retrospective scan was triggered in the descending thoracic aorta at 111 HU's. Gantry rotation speed was 270 msecs and collimation was .9 mm. No beta blockade or nitro were given. The 3D data set was reconstructed in 5% intervals of the R-R cycle. Systolic and diastolic phases were analyzed on a dedicated work station using MPR, MIP and VRT modes. The patient received 80 cc of contrast.  FINDINGS: Aortic Valve: Calcified tri leaflet with restricted motion  Aorta: Moderate calcific aortic atherosclerosis with normal arch vessels and no aneurysm  Sinotubular Junction: 24 mm  Ascending Thoracic Aorta: 32 mm  Aortic Arch: 30 mm  Descending Thoracic Aorta: 28 mm  Sinus  of Valsalva Measurements:  Non-coronary: 29 mm  Right - coronary: 28.3 mm  Left - coronary: 28.4 mm  Coronary Artery Height above Annulus:  Left Main: 10 mm above annulus  Right Coronary: 12 mm above annulus  Patent LIMA to LAD  Patent SVG to LAD  Patent SVG to OM  Virtual Basal Annulus Measurements:  Maximum/Minimum Diameter: 26.5 mm x 21.2 mm  Perimeter: 75.6 mm  Area: 416 mm2  Coronary Arteries: Sufficient height above annulus for deployment with patent left sided grafts  Optimum Fluoroscopic Angle for Delivery: LAO 9 degrees Cranial 7 degrees  IMPRESSION: 1. Calcified tri leaflet AV with annular area of 416 mm2 suitable for a 23 mm Sapien 3 valve  2. Coronary arteries sufficient height above annulus for deployment with patent left sided grafts  3.  No LAA Thrombus  4. Optimum angiographic angle for deployment LAO 9 degrees Cranial 7 degrees  5. Normal aortic root 3.2 cm with moderate calcific aortic atherosclerosis  Jenkins Rouge   Electronically Signed   By: Jenkins Rouge M.D.   On: 04/18/2017 11:18   CT ANGIOGRAPHY CHEST, ABDOMEN AND PELVIS  TECHNIQUE: Multidetector CT imaging through the chest, abdomen and pelvis was performed using the standard protocol during bolus administration of intravenous contrast. Multiplanar reconstructed images and MIPs were obtained and reviewed to evaluate the vascular anatomy.  CONTRAST:  152mL ISOVUE-370 IOPAMIDOL (ISOVUE-370) INJECTION 76%  COMPARISON:  CT the abdomen and pelvis 04/15/2011. No prior chest CT.  FINDINGS: CTA CHEST FINDINGS  Cardiovascular: Heart size is enlarged with severe left atrial dilatation. There is no significant pericardial fluid, thickening or pericardial calcification. There is aortic atherosclerosis, as well as atherosclerosis of the great vessels of the mediastinum and the coronary arteries, including calcified atherosclerotic plaque in the left  anterior  descending, left circumflex and right coronary arteries. Status post median sternotomy for CABG. Severe thickening calcification of the aortic valve. Mild calcification of the mitral annulus.  Mediastinum/Lymph Nodes: No pathologically enlarged mediastinal or hilar lymph nodes. Small hiatal hernia. Esophagus is otherwise unremarkable in appearance. No axillary lymphadenopathy.  Lungs/Pleura: No suspicious appearing pulmonary nodules or masses. No acute consolidative airspace disease. No pleural effusions.  Musculoskeletal/Soft Tissues: Median sternotomy wires. Old sternal fracture with what appears to be fibrous union and posttraumatic deformity. There are no aggressive appearing lytic or blastic lesions noted in the visualized portions of the skeleton.  CTA ABDOMEN AND PELVIS FINDINGS  Hepatobiliary: No suspicious cystic or solid hepatic lesions. No intra or extrahepatic biliary ductal dilatation. Status post cholecystectomy.  Pancreas: No pancreatic mass. No pancreatic ductal dilatation. No pancreatic or peripancreatic fluid or inflammatory changes.  Spleen: Unremarkable.  Adrenals/Urinary Tract: 3.0 x 4.1 x 3.1 cm enhancing mass in the interpolar region of the right kidney (axial image 100 of series 15 and coronal image 67 of series 16). This lesion is new compared to prior study from 04/15/2011, does not extend into the right renal vein (which is widely patent), and is completely encapsulated within Gerota's fascia. Several subcentimeter low-attenuation lesions in both kidneys, too small to definitively characterize, but favored to represent tiny cysts. No hydroureteronephrosis. Urinary bladder is normal in appearance. Bilateral adrenal glands are normal in appearance.  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 No pathologic dilatation of small bowel or colon. Numerous colonic diverticulae are noted, particularly in the  region of the sigmoid colon, without surrounding inflammatory changes to suggest an acute diverticulitis at this time. The appendix is not confidently identified and may be surgically absent. Regardless, there are no inflammatory changes noted adjacent to the cecum to suggest the presence of an acute appendicitis at this time.  Vascular/Lymphatic: Aortic atherosclerosis with fusiform aneurysmal dilatation of the infrarenal abdominal aorta which measures up to 3.4 x 3.0 cm (axial image 113 of series 15). 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 No lymphadenopathy noted in the abdomen or pelvis.  Reproductive: Uterus and ovaries are a trophic and otherwise unremarkable in appearance.  Other: No significant volume of ascites.  No pneumoperitoneum.  Musculoskeletal: Chronic appearing compression fracture of L1 with 20% loss of anterior vertebral body height. There are no aggressive appearing lytic or blastic lesions noted in the visualized portions of the skeleton.  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  LEFT PELVIS:  Left Common Iliac Artery -  Minimal Diameter-6.7 x 5.5 mm  Tortuosity-mild  Calcification-moderate  Left External Iliac Artery -  Minimal Diameter-6.2 x 6.1 mm  Tortuosity-mild  Calcification-mild-to-moderate  Left Common Femoral Artery -  Minimal Diameter-7.2 x 7.3  mm  Tortuosity-mild  Calcification-moderate  Review of the MIP images confirms the above findings.  IMPRESSION: 1. Vascular findings and measurements pertinent to potential TAVR procedure, as detailed above. 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 2. Severe thickening calcification of the aortic valve, compatible with the reported  clinical history of severe aortic stenosis. 3. New large right renal mass measuring 3.0 x 4.1 x 3.1 cm highly concerning for renal cell carcinoma. This is completely encapsulated within Gerota's fascia, does not involve the right renal vein, and is not associated with local lymphadenopathy or definite findings to suggest metastatic disease in the chest, abdomen or pelvis. Urologic consultation is recommended in the near future for further evaluation. 4. Aortic atherosclerosis, in addition to left main and 3 vessel coronary artery disease. Status post median sternotomy for CABG. 5. In addition, there is an infrarenal abdominal aortic aneurysm measuring up to 3.4 x 3.0 cm (mean diameter of 3.2 cm). Recommend followup by ultrasound in 3 years. This recommendation follows ACR consensus guidelines: White Paper of the ACR Incidental Findings Committee II on Vascular Findings. J Am Coll Radiol 2013; 10:789-794. 6. Cardiomegaly with severe left atrial dilatation. 7. Mild colonic diverticulosis without evidence to suggest an acute diverticulitis at this time. 8. Additional incidental findings, as above.  Aortic Atherosclerosis (ICD10-I70.0). Aortic aneurysm NOS (ICD10-I71.9).   Electronically Signed   By: Vinnie Langton M.D.   On: 04/18/2017 11:51    STS Risk Calculator  Procedure: Isolated AVR CALCULATE  Risk of Mortality:  3.087%   Renal Failure:  1.773%   Permanent Stroke:  1.875%   Prolonged Ventilation:  11.425%   DSW Infection:  0.486%   Reoperation:  2.443%   Morbidity or Mortality:  14.954%    Short Length of Stay:  28.258%   Long Length of Stay:  6.566%   Procedure: AVR + CAB CALCULATE  Risk of Mortality:  5.894%   Renal Failure:  2.199%   Permanent Stroke:  2.784%   Prolonged Ventilation:  16.667%   DSW Infection:  0.535%   Reoperation:  3.857%   Morbidity or Mortality:  22.698%   Short Length of Stay:  13.708%   Long Length of Stay:  11.736%      Impression:  Patient has stage D severe symptomatic aortic stenosis.  She presents with gradual progression of symptoms of exertional shortness of breath and fatigue consistent with chronic diastolic congestive heart failure, [place] Heart Association functional class IIb-III. I have personally reviewed the patient's recent transthoracic echocardiogram, diagnostic cardiac catheterization, and CT angiograms.  Echocardiogram reveals severe aortic stenosis with normal left ventricular systolic function.  The aortic valve is trileaflet with severe thickening, calcification, and restricted leaflet mobility involving all 3 leaflets of the aortic valve.  Peak velocity across the aortic valve measured 4.1 m/s corresponding to mean transvalvular gradient estimated 39 mmHg.  Diagnostic cardiac catheterization confirmed the presence of severe aortic stenosis and revealed severe native coronary artery disease but continued patency of previous left internal mammary artery graft to the mid left anterior descending coronary artery, free right internal mammary artery graft to the distal left anterior descending coronary artery, and sequential saphenous vein grafts to the first and second obtuse marginal branches of the left circumflex coronary artery.  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  I agree the patient needs aortic valve replacement.  Risks associated with conventional surgery would be high, probably much higher than that predicted using the STS risk calculator.  The patient has had 2 previous  coronary artery bypass operations and has relatively small size aortic root.  There is significant calcification in the aorta.  I would not consider this patient a candidate for conventional surgery via a third time median sternotomy with or without redo  coronary artery bypass grafting.  Cardiac-gated CTA of the heart reveals anatomical characteristics consistent with aortic stenosis suitable for treatment by transcatheter aortic valve replacement without any significant complicating features and CTA of the aorta and iliac vessels demonstrate what appears to be adequate pelvic vascular access to facilitate a transfemoral approach.  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  There appears to be adequate axis on the left side, although there is significant disease throughout the entire infrarenal aorta and iliac system.  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  Mass is completely contained within Gerota's fascia, does not involve the renal vein, and is not associated with any other findings to suggest locally advanced or distant metastatic disease.    Plan:  The patient was counseled at length regarding treatment alternatives for management of severe symptomatic aortic stenosis. Alternative approaches such as conventional aortic valve replacement, transcatheter aortic valve replacement, and continued medical therapy without intervention were compared and contrasted at length.  The risks associated with conventional surgical aortic valve replacement were discussed in detail, as were expectations for post-operative convalescence, and why I would be reluctant to consider this patient a candidate for conventional surgery.  Issues specific to transcatheter aortic valve replacement were discussed including questions about long term valve durability, the potential for paravalvular  leak, possible increased risk of need for permanent pacemaker placement, and other technical complications related to the procedure itself.  Long-term prognosis with medical therapy was discussed. This discussion was placed in the context of the patient's own specific clinical presentation and past medical history.  All of her questions have been addressed.  The patient hopes to proceed with transcatheter aortic valve replacement as soon as practical.  We tentatively plan to proceed with surgery on May 09, 2017.  The patient will need urology consultation to address the recently discovered right renal mass, although further diagnostic testing and/or surgical intervention can and probably should wait until after her aortic stenosis has been definitively treated.  Following the decision to proceed with transcatheter aortic valve replacement, a discussion has been held regarding what types of management strategies would be attempted intraoperatively in the event of life-threatening complications, including whether or not the patient would be considered a candidate for the use of cardiopulmonary bypass and/or conversion to open sternotomy for attempted surgical intervention.  The patient would not be considered a candidate for emergency third time redo median sternotomy under any circumstances.  The patient has been advised of a variety of complications that might develop including but not limited to risks of death, stroke, paravalvular leak, aortic dissection or other major vascular complications, aortic annulus rupture, device embolization, cardiac rupture or perforation, mitral regurgitation, acute myocardial infarction, arrhythmia, heart block or bradycardia requiring permanent pacemaker placement, congestive heart failure, respiratory failure, renal failure, pneumonia, infection, other late complications related to structural valve deterioration or migration, or other complications that might ultimately cause a  temporary or permanent loss of functional independence or other long term morbidity.  The patient provides full informed consent for the procedure as described and all questions were answered.   I spent in excess of 90 minutes during the conduct of this office consultation and >50% of this time involved direct face-to-face encounter with the patient for counseling and/or coordination of their care.   Valentina Gu. Roxy Manns, MD 04/18/2017 3:28 PM

## 2017-04-19 ENCOUNTER — Institutional Professional Consult (permissible substitution): Payer: Medicare Other | Admitting: Cardiovascular Disease

## 2017-04-19 ENCOUNTER — Encounter (HOSPITAL_COMMUNITY): Payer: Self-pay | Admitting: Radiology

## 2017-04-25 ENCOUNTER — Other Ambulatory Visit: Payer: Self-pay

## 2017-04-25 DIAGNOSIS — I350 Nonrheumatic aortic (valve) stenosis: Secondary | ICD-10-CM

## 2017-04-26 ENCOUNTER — Encounter: Payer: Self-pay | Admitting: Surgery

## 2017-04-26 ENCOUNTER — Telehealth: Payer: Self-pay | Admitting: Cardiology

## 2017-04-26 ENCOUNTER — Other Ambulatory Visit (HOSPITAL_COMMUNITY): Payer: Medicare Other

## 2017-04-26 ENCOUNTER — Institutional Professional Consult (permissible substitution): Payer: Medicare Other | Admitting: Surgery

## 2017-04-26 ENCOUNTER — Other Ambulatory Visit: Payer: Self-pay

## 2017-04-26 ENCOUNTER — Ambulatory Visit: Payer: Medicare Other | Attending: Cardiovascular Disease | Admitting: Physical Therapy

## 2017-04-26 ENCOUNTER — Encounter: Payer: Self-pay | Admitting: Physical Therapy

## 2017-04-26 ENCOUNTER — Encounter: Payer: Medicare Other | Admitting: Thoracic Surgery (Cardiothoracic Vascular Surgery)

## 2017-04-26 VITALS — BP 117/54 | HR 67 | Resp 16 | Ht 67.75 in | Wt 212.0 lb

## 2017-04-26 DIAGNOSIS — I350 Nonrheumatic aortic (valve) stenosis: Secondary | ICD-10-CM

## 2017-04-26 DIAGNOSIS — R262 Difficulty in walking, not elsewhere classified: Secondary | ICD-10-CM | POA: Diagnosis present

## 2017-04-26 NOTE — Therapy (Signed)
[place], [place], [address] Phone: [phone]   Fax:  [phone]  Physical Therapy Evaluation  Patient Details  Name: [name] MRN: [id number] Date of Birth: 12-09-1940 Referring Provider: Sherren Mocha, MD   Encounter Date: 04/26/2017  PT End of Session - 04/26/17 0804    Visit Number  1    PT Start Time  0800    PT Stop Time  0828    PT Time Calculation (min)  28 min    Activity Tolerance  Patient limited by pain;Patient limited by fatigue    Behavior During Therapy  [facility] for tasks assessed/performed       Past Medical History:  Diagnosis Date  . Abdominal aortic aneurysm ([place])    infrarenal diagnosed with maximum measurement at 3.3 cm  . Depression   . Diverticulosis   . GERD (gastroesophageal reflux disease)   . History of tobacco abuse    Recurrent cigarette smoking  . Hyperlipidemia   . Hyperplastic colon polyp   . Hypertension   . Hypothyroidism   . Ischemic heart disease    had redo bypass surgery in 2001, at that time she had a free right internal mammary to the LAD, LAD endarterectomy, sequential vein graft to the OM1 and OM2 -- last catheterization was in 2008  . Leg fracture    remote right leg fracture  . Microscopic colitis   . Obesity    with gastric bypass surgery in 2004  . PAD (peripheral artery disease) ([facility])    right iliac occlusion  . PONV (postoperative nausea and vomiting)   . S/P CABG x 4 1985   LIMA to LAD, Sequential SVG to OM1-OM2, SVG to RCA - Dr Redmond Pulling  . S/P redo CABG x 3 2001   Free RIMA to LAD with endarterectomy, sequential SVG to OM1-OM2 - Dr Servando Snare    Past Surgical History:  Procedure Laterality Date  . CARDIAC CATHETERIZATION  02/16/1999   Left heart catheterization with selective coronary  angiography, left ventricular angiography (RAO and LAO views), angiography of left internal mammary artery, and angiography of saphenous vein grafts  .  CARDIAC CATHETERIZATION  10/20/2006   Est. EF of 55% -- Totally occuladed native coronary circulation -- Persistent patency of the right mammary graft and the distal LAD artery with peristent patency of the left mammary graft to the proximal LAD artery with persistant patency of the saphenous vein graft to the sequentail branches of the OM        . CARPAL TUNNEL RELEASE    . CHOLECYSTECTOMY    . COLONOSCOPY  07/12/2007   Dr.Brodie- normal cecum and rectum, diverticulosis in the sigmoid colon  . COLONOSCOPY  1991   per Dr.Brodie office visit note= hyperplastic polyp  . COLONOSCOPY N/A 09/30/2014   Dr.Rourk- noraml appearing rectal mucosa, scattered L sided diverticula, colonic mucosa has a somewhat pale friable appearence diffusely, no ulcers or erosions seen, the distal 10cm of terminal ileum appeared entirely normal. bx=benign colorectal mucosa with lymphocytic colitis  . CORONARY ARTERY BYPASS GRAFT  1985  . CORONARY ARTERY BYPASS GRAFT  03/09/1999   Redo CABG x3 --  with the free right internal mammary artey to the LAD with an LAD endarterectomy -- Sequential saphenous vein graft to OM1 and OM2                   . ESOPHAGOGASTRODUODENOSCOPY N/A 09/30/2014   Dr.Rourk-mild erosive reflux esophagitis s/p bariatric surgery  .  GASTRIC BYPASS  2004  . RIGHT/LEFT HEART CATH AND CORONARY/GRAFT ANGIOGRAPHY N/A 04/04/2017   Procedure: RIGHT/LEFT HEART CATH AND CORONARY/GRAFT ANGIOGRAPHY;  Surgeon: Martinique, Peter M, MD;  Location: [place] [facility];  Service: Cardiovascular;  Laterality: N/A;    There were no vitals filed for this visit.   Subjective Assessment - 04/26/17 0804    Subjective  Pt reports SOB, fatigue and lack of energy for "quite a while". 6-8 mo of SOB but difficult to pinpoint.     Currently in Pain?  Yes    Pain Score  3     Pain Location  Knee    Pain Orientation  Right    Pain Descriptors / Indicators  Aching    Aggravating Factors   weight bearing    Pain Relieving Factors   rest         [facility] PT Assessment - 04/26/17 0001      Assessment   Medical Diagnosis  severe aortic stenosis    Referring Provider  Sherren Mocha, MD      Balance Screen   Has the patient fallen in the past 6 months  No      [place] residence    Living Arrangements  Spouse/significant other    Additional Comments  3 steps to enter home      Prior Function   Level of [place]  Retired      Associate Professor   Overall Cognitive Status  Within Functional Limits for tasks assessed      Sensation   Additional Comments  occasional numbness in Rt foot      Posture/Postural Control   Posture Comments  kyphotic, forward head, post pelvic tilt      ROM / Strength   AROM / PROM / Strength  AROM;Strength      AROM   Overall AROM Comments  WFL      Strength   Overall Strength Comments  gross 5/5 except GHJ ER 4/5, bil hip flexion 5/5      Ambulation/Gait   Gait Comments  WFL, limited by nausea, dizziness & SOB      High Level Balance   High Level Balance Comments  safely maintained full tandem <10s bilat       [facility] Pre-Surgical Assessment - 04/26/17 0001    5 Meter Walk Test- trial 1  5 sec    5 Meter Walk Test- trial 2  4 sec.     5 Meter Walk Test- trial 3  4 sec.    5 meter walk test average  4.33 sec    Comment  unable without UE assist    6 Minute Walk- Baseline  yes    BP (mmHg)  107/61    HR (bpm)  69    02 Sat (%RA)  99 %    Modified Borg Scale for Dyspnea  0- Nothing at all    Perceived Rate of Exertion (Borg)  6-    6 Minute Walk Post Test  yes    BP (mmHg)  154/59    HR (bpm)  73    02 Sat (%RA)  97 %    Modified Borg Scale for Dyspnea  2- Mild shortness of breath    Perceived Rate of Exertion (Borg)  13- Somewhat hard    Aerobic Endurance Distance Walked  585    Endurance additional comments  62% disability  Objective measurements completed on examination: See above  findings.        Plan - 04/26/17 0828    Clinical Impression Statement  see below    PT Frequency  -- one-time TAVR evaluation      Clinical Impression Statement: Pt is a 77 yo F presenting to OP PT for evaluation prior to possible TAVR surgery due to severe aortic stenosis. Pt reports onset of SOB approximately 6 months ago. Symptoms are limiting functional community ambulation. Pt presents with WFL ROM and strength, poor balance and is at high fall risk 4 stage balance test, WFL walking speed and poor aerobic endurance per 6 minute walk test. Pt required 3 rest breaks during 6 min walk test of 30s, 40s and 1 min and was unable to complete last 32s of 6 min walk test due to dizziness and nausea. Reported BORG score of 3/10 and RPE 13/20. Pt ambulated a total of 585 feet in 6 minute walk. BP and R knee/leg pain increased significantly with 6 minute walk test. Based on the Short Physical Performance Battery, patient has a frailty rating of 5/12 with </= 5/12 considered frail.     Patient demonstrates following deficits and impairments:     Visit Diagnosis: Difficulty in walking, not elsewhere classified     Problem List Patient Active Problem List   Diagnosis Date Noted  . GERD (gastroesophageal reflux disease) 10/19/2016  . 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 Carotid arterial disease ([place]) 05/28/2015  . Bilateral carotid bruits 01/16/2015  . Reflux esophagitis   . Hiatal hernia   . Status post bariatric surgery   . Chronic diarrhea   . Diverticulosis of colon without hemorrhage   . PAD (peripheral artery disease) ([place]) 04/13/2011  . Aortic stenosis, severe 09/10/2010  . CAD (coronary artery disease) 08/11/2010  . HTN (hypertension) 08/11/2010  . Obesities, morbid ([place]) 08/11/2010  . Tobacco abuse 08/11/2010  . Abdominal aortic aneurysm ([place]) 12/28/2007  . DIVERTICULOSIS, COLON 12/28/2007  . FATTY LIVER DISEASE 12/28/2007  . ANEMIA, HX OF 12/28/2007  . NAUSEA 12/12/2007  .  ABDOMINAL PAIN, EPIGASTRIC 12/12/2007  . S/P redo CABG x 3 03/09/1999  . S/P CABG x 4 04/19/1983    Cherrill Scrima C. Taiten Brawn PT, DPT 04/26/17 8:40 AM   [facility] [address] [place], [place], [address] Phone: [phone]   Fax:  [phone]  Name: JERYN BERTONI MRN: [id number] Date of Birth: 1940-08-06

## 2017-04-26 NOTE — H&P (View-Only) (Signed)
Patient ID: [name], female   DOB: January 28, 1940, 77 y.o.   MRN: [id number]  [facility]ERY CONSULTATION REPORT  Referring Provider is Martinique, Peter M, MD PCP is [name], MD  Chief Complaint  Patient presents with  . 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 Redmond Pulling and redo CABG x3 by Dr. Servando Snare in 2001.  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  Over the past year she has had progression of exertional shortness of breath and fatigue.  She said this point she can get outside to walk and has become very sedentary.  She has had some orthopnea when lying down at night causing her to sit upright in bed.  She has had mild lower extremity edema recently and has noted weight gain over the past 6 months.  She denies any chest discomfort.  She denies dizziness and syncope.  She had a follow-up echocardiogram in March 2019 which showed a peak velocity across aortic valve of 4.1 m/s corresponding to a mean transvalvular gradient of 75 mmHg.  The dimensionless index was 0.2.  Aortic valve area was 0.69 cm.  Left ventricular ejection fraction was 55-60%.  Cardiac catheterization on April 04, 2017 showed a peak to peak gradient of 35 mmHg and a mean gradient of 32 mmHg.  There was severe native three-vessel coronary disease with continued patency of her left internal mammary graft to the mid LAD, patency of a free right internal mammary graft to the distal LAD, and a patent sequential saphenous vein graft to the first and second obtuse marginal branches of the left circumflex coronary  artery.  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  She is married and lives with her husband in [place].  She has a daughter who lives with her.  She has been very active until the past 6-12 months and was limited only by some chronic pain in her right knee and claudication in the right calf.  She said that her main limitation now is shortness of breath and fatigue with low level activity.  Past Medical History:  Diagnosis Date  . Abdominal aortic aneurysm ([place])    infrarenal diagnosed with maximum measurement at 3.3 cm  . Depression   . Diverticulosis   . GERD (gastroesophageal reflux disease)   . History of tobacco abuse    Recurrent cigarette smoking  . Hyperlipidemia   . Hyperplastic colon polyp   . Hypertension   . Hypothyroidism   . Ischemic heart disease    had redo bypass surgery in 2001, at that time she had a free right internal mammary to the LAD, LAD endarterectomy, sequential vein graft to the OM1 and OM2 -- last catheterization was in 2008  . Leg fracture    remote right leg fracture  . Microscopic colitis   . Obesity    with gastric bypass surgery in 2004  . PAD (peripheral artery disease) (H[facility])    right iliac occlusion  . PONV (postoperative nausea and vomiting)   . S/P CABG x 4 1985   LIMA to LAD, Sequential SVG  to OM1-OM2, SVG to RCA - Dr Redmond Pulling  . S/P redo CABG x 3 2001   Free RIMA to LAD with endarterectomy, sequential SVG to OM1-OM2 - Dr Servando Snare    Past Surgical History:  Procedure Laterality Date  . CARDIAC CATHETERIZATION  02/16/1999   Left heart catheterization with selective coronary  angiography, left ventricular angiography (RAO and LAO views), angiography of left internal mammary artery, and angiography of saphenous vein grafts  . CARDIAC CATHETERIZATION  10/20/2006   Est. EF of 55% -- Totally occuladed native coronary circulation -- Persistent patency of the right mammary graft and the distal LAD artery with peristent patency of the  left mammary graft to the proximal LAD artery with persistant patency of the saphenous vein graft to the sequentail branches of the OM        . CARPAL TUNNEL RELEASE    . CHOLECYSTECTOMY    . COLONOSCOPY  07/12/2007   Dr.Brodie- normal cecum and rectum, diverticulosis in the sigmoid colon  . COLONOSCOPY  1991   per Dr.Brodie office visit note= hyperplastic polyp  . COLONOSCOPY N/A 09/30/2014   Dr.Rourk- noraml appearing rectal mucosa, scattered L sided diverticula, colonic mucosa has a somewhat pale friable appearence diffusely, no ulcers or erosions seen, the distal 10cm of terminal ileum appeared entirely normal. bx=benign colorectal mucosa with lymphocytic colitis  . CORONARY ARTERY BYPASS GRAFT  1985  . CORONARY ARTERY BYPASS GRAFT  03/09/1999   Redo CABG x3 --  with the free right internal mammary artey to the LAD with an LAD endarterectomy -- Sequential saphenous vein graft to OM1 and OM2                   . ESOPHAGOGASTRODUODENOSCOPY N/A 09/30/2014   Dr.Rourk-mild erosive reflux esophagitis s/p bariatric surgery  . GASTRIC BYPASS  2004  . RIGHT/LEFT HEART CATH AND CORONARY/GRAFT ANGIOGRAPHY N/A 04/04/2017   Procedure: RIGHT/LEFT HEART CATH AND CORONARY/GRAFT ANGIOGRAPHY;  Surgeon: Martinique, Peter M, MD;  Location: [facility];  Service: Cardiovascular;  Laterality: N/A;    Family History  Problem Relation Age of Onset  . Stroke Father   . Diabetes Mother   . Pancreatic cancer Mother     Social History   Socioeconomic History  . Marital status: Married    Spouse name: Not on file  . Number of children: Not on file  . Years of education: Not on file  . Highest education level: Not on file  Occupational History  . Occupation: reitred    Fish farm manager: [employer]  Social Needs  . Financial resource strain: Not on file  . Food insecurity:    Worry: Not on file    Inability: Not on file  . Transportation needs:    Medical: Not on file    Non-medical: Not on file    Tobacco Use  . Smoking status: Current Every Day Smoker    Packs/day: 0.25    Types: Cigarettes    Start date: 01/10/1985    Last attempt to quit: 02/11/2016    Years since quitting: 1.2  . Smokeless tobacco: Never Used  . Tobacco comment: smokes 1-2 cigarettes a day  Substance and Sexual Activity  . Alcohol use: No    Alcohol/week: 0.0 oz  . Drug use: No  . Sexual activity: Never  Lifestyle  . Physical activity:    Days per week: Not on file    Minutes per session: Not on file  . Stress: Not on file  Relationships  . Social connections:    Talks on phone: Not on file    Gets together: Not on file    Attends religious service: Not on file    Active member of club or organization: Not on file    Attends meetings of clubs or organizations: Not on file    Relationship status: Not on file  . Intimate partner violence:    Fear of current or ex partner: Not on file    Emotionally abused: Not on file    Physically abused: Not on file    Forced sexual activity: Not on file  Other Topics Concern  . Not on file  Social History Narrative  . Not on file    Current Outpatient Medications  Medication Sig Dispense Refill  . acetaminophen (TYLENOL) 500 MG tablet Take 1,000 mg by mouth every 6 (six) hours as needed for moderate pain or headache.     . ALPRAZolam (XANAX) 0.25 MG tablet Take 0.25 mg by mouth 3 (three) times daily as needed for anxiety.  0  . amLODipine (NORVASC) 10 MG tablet Take 1 tablet (10 mg total) by mouth daily. 30 tablet 6  . aspirin EC 81 MG tablet Take 81 mg by mouth at bedtime.     . chlorthalidone (HYGROTON) 25 MG tablet Take 1/2 tablet ( 12.5 mg ) daily (Patient taking differently: Take 12.5 mg by mouth daily. ) 30 tablet 6  . FLUoxetine (PROZAC) 40 MG capsule TAKE ONE CAPSULE BY MOUTH EVERY DAY (Patient taking differently: Take 40 mg by mouth in the evening) 90 capsule 1  . furosemide (LASIX) 20 MG tablet TAKE 1 TABLET BY MOUTH EVERY DAY AS NEEDED FOR EDEMA  (Patient taking differently: TAKE 20 MG BY MOUTH EVERY DAY AS NEEDED FOR EDEMA) 30 tablet 11  . irbesartan (AVAPRO) 300 MG tablet TAKE 1 TABLET BY MOUTH EVERY DAY (Patient taking differently: Take 300 mg by mouth every day) 30 tablet 4  . levothyroxine (SYNTHROID, LEVOTHROID) 50 MCG tablet TAKE 1 TABLET BY MOUTH EVERY DAY BEFORE BREAKFAST TAKE AN EXTRA 1/2 TAB ONE TIME EACH WEEK (Patient taking differently: Take 50 mcg by mouth every morning before breakfast) 96 tablet 4  . loperamide (IMODIUM) 2 MG capsule Take 2 mg by mouth as needed for diarrhea or loose stools. Reported on 05/01/2015    . Nebivolol HCl (BYSTOLIC) 20 MG TABS Take 1 tablet (20 mg total) by mouth daily. (Patient taking differently: Take 20 mg by mouth every evening. ) 90 tablet 2  . nitroGLYCERIN (NITROSTAT) 0.4 MG SL tablet Place 0.4 mg under the tongue every 5 (five) minutes as needed for chest pain (MAX 3 TABLETS).    . pantoprazole (PROTONIX) 40 MG tablet Take 1 tablet (40 mg total) by mouth daily. (Patient taking differently: Take 40 mg by mouth every evening. ) 30 tablet 11  . PROAIR HFA 108 (90 Base) MCG/ACT inhaler Inhale 2 puffs into the lungs every 4 (four) hours as needed for shortness of breath or wheezing.  2  . simvastatin (ZOCOR) 40 MG tablet TAKE 1 TABLET OP AT BEDTIME (Patient taking differently: Take 40 mg by mouth at bedtime) 90 tablet 2  . SYMBICORT 160-4.5 MCG/ACT inhaler Inhale 1 puff into the lungs 2 (two) times daily.  2   No current facility-administered medications for this visit.     Allergies  Allergen Reactions  . Dipyridamole Hives  . Chantix [Varenicline Tartrate] Hives      Review of Systems:  General:  normal appetite, decreased energy, + weight gain, no weight loss, no fever  Cardiac:  no chest pain with exertion, no chest pain at rest, +SOB with mild exertion, occasional resting SOB, + PND, + orthopnea, no palpitations, no arrhythmia, no atrial fibrillation, + LE edema, no dizzy spells, no  syncope  Respiratory:  + shortness of breath, no home oxygen, no productive cough, no dry cough, no bronchitis, no wheezing, no hemoptysis, no asthma, no pain with inspiration or cough, no sleep apnea, no CPAP at night  GI:   no difficulty swallowing, no reflux, no frequent heartburn, no hiatal hernia, no abdominal pain, no constipation, no diarrhea, no hematochezia, no hematemesis, no melena  GU:   no dysuria,  no frequency, no urinary tract infection, no hematuria, no kidney stones, no kidney disease  Vascular:  + pain suggestive of claudication in right calf, no pain in feet, no leg cramps, no varicose veins, no DVT, no non-healing foot ulcer  Neuro:   no stroke, no TIA's, no seizures, no headaches, no temporary blindness one eye,  no slurred speech, no peripheral neuropathy, + chronic pain, no instability of gait, no memory/cognitive dysfunction  Musculoskeletal: + arthritis, + joint swelling, no myalgias, no difficulty walking, normal mobility   Skin:   no rash, no itching, no skin infections, no pressure sores or ulcerations  Psych:   no anxiety, no depression, no nervousness, no unusual recent stress  Eyes:   no blurry vision, no floaters, no recent vision changes, does not wear glasses or contacts  ENT:   n hearing loss, no loose or painful teeth, no dentures, last saw dentist recently  Hematologic:  + easy bruising, no abnormal bleeding, no clotting disorder, no frequent epistaxis  Endocrine:  no diabetes, does not check CBG's at home        Physical Exam:   BP (!) 117/54 (BP Location: Right Arm, Patient Position: Sitting, Cuff Size: Large)   Pulse 67   Resp 16   Ht 5' 7.75" (1.721 m)   Wt 212 lb (96.2 kg)   SpO2 97% Comment: ON RA  BMI 32.47 kg/m   General:  Obese,  well-appearing  HEENT:  Unremarkable, NCAT, PERLA, EOMI, Oropharynx clear  Neck:   no JVD, no bruits, no adenopathy or thyromegaly  Chest:   clear to auscultation, symmetrical breath sounds, no wheezes, no rhonchi     CV:   RRR, grade III/VI crescendo/decrescendo murmur heard best at RSB,  no diastolic murmur  Abdomen:  soft, non-tender, no masses or organomegaly  Extremities:  warm, well-perfused, pedal pulse not palpable on right, diminished but palpable on left, no LE edema  Rectal/GU  Deferred  Neuro:   Grossly non-focal and symmetrical throughout  Skin:   Clean and dry, no rashes, no breakdown   Diagnostic Tests:         [facility]                        [address], [place] [address]                            [phone]  ------------------------------------------------------------------- Transthoracic Echocardiography  Patient:    [name], [name] MR #:       [id number] Study Date:  03/29/2017 Gender:     F Age:        38 Height:     167.6 cm Weight:     94.3 kg BSA:        2.13 m^2 Pt. Status: Room:   ATTENDING    Peter Martinique, M.D.  ORDERING     Peter Martinique, M.D.  REFERRING    Peter Martinique, M.D.  SONOGRAPHER  Cindy Hazy, RDCS  PERFORMING   [facility], Outpatient  cc:  ------------------------------------------------------------------- LV EF: 55% -   60%  ------------------------------------------------------------------- Indications:      I35.9 Aortic Valve Disorder.  ------------------------------------------------------------------- History:   PMH:  Acquired from the patient and from the patient&'s chart.  PMH:  AAA. Hypothyroidism. Ischemic heart disease. Peripheral artery disease.  Risk factors:  Current tobacco use. Hypertension. Obese. Dyslipidemia.  ------------------------------------------------------------------- Study Conclusions  - Left ventricle: The cavity size was normal. Wall thickness was   increased in a pattern of mild LVH. Systolic function was normal.   The estimated ejection fraction was in the range of 55% to 60%.   Wall motion was normal; there were no regional wall motion    abnormalities. Doppler parameters are consistent with abnormal   left ventricular relaxation (grade 1 diastolic dysfunction). - Aortic valve: Poorly visualized. Probably trileaflet; severely   calcified leaflets. There was severe stenosis. There was mild   regurgitation. Mean gradient (S): 41 mm Hg. Peak gradient (S): 67   mm Hg. Valve area (VTI): 0.69 cm^2. - Mitral valve: Mildly calcified annulus. Mildly calcified leaflets   . There was trivial regurgitation. - Left atrium: The atrium was moderately dilated. - Right ventricle: The cavity size was normal. Systolic function   was mildly reduced. - Tricuspid valve: Peak RV-RA gradient (S): 32 mm Hg. - Pulmonary arteries: PA peak pressure: 35 mm Hg (S). - Inferior vena cava: The vessel was normal in size. The   respirophasic diameter changes were in the normal range (>= 50%),   consistent with normal central venous pressure.  Impressions:  - Normal LV size with mild LV hypertrophy. EF 55-60%. Severe aortic   stenosis, mild aortic insufficiency. Moderate left atrial   enlargement. Normal RV size with mildly decreased systolic   function.  ------------------------------------------------------------------- Labs, prior tests, procedures, and surgery: Gastric Bypass-2004.  Coronary artery bypass grafting.  ------------------------------------------------------------------- Study data:   Study status:  Routine.  Procedure:  The patient reported no pain pre or post test. Transthoracic echocardiography for left ventricular function evaluation, for right ventricular function evaluation, and for assessment of valvular function. Image quality was adequate.  Study completion:  There were no complications.          Transthoracic echocardiography.  M-mode, complete 2D, spectral Doppler, and color Doppler.  Birthdate: Patient birthdate: 1940-02-02.  Age:  Patient is 77 yr old.  Sex: Gender: female.    BMI: 33.6 kg/m^2.  Blood pressure:      116/54 Patient status:  Outpatient.  Study date:  Study date: 03/29/2017. Study time: 10:02 AM.  Location:  [facility]  -------------------------------------------------------------------  ------------------------------------------------------------------- Left ventricle:  The cavity size was normal. Wall thickness was increased in a pattern of mild LVH. Systolic function was normal. The estimated ejection fraction was in the range of 55% to 60%. Wall motion was normal; there were no regional wall motion abnormalities. Doppler parameters are consistent with abnormal left ventricular relaxation (grade 1 diastolic dysfunction).  ------------------------------------------------------------------- Aortic valve:  Poorly visualized.  Probably trileaflet; severely calcified leaflets.  Doppler:   There was severe stenosis.   There was mild regurgitation.    VTI ratio of LVOT to aortic valve: 0.2. Valve area (VTI): 0.69 cm^2. Indexed valve area (VTI): 0.32 cm^2/m^2. Peak velocity ratio of LVOT to aortic valve: 0.2. Valve area (Vmax): 0.7 cm^2. Indexed valve area (Vmax): 0.33 cm^2/m^2. Mean velocity ratio of LVOT to aortic valve: 0.2. Valve area (Vmean): 0.7 cm^2. Indexed valve area (Vmean): 0.33 cm^2/m^2. Mean gradient (S): 41 mm Hg. Peak gradient (S): 67 mm Hg.  ------------------------------------------------------------------- Aorta:  Aortic root: The aortic root was normal in size. Ascending aorta: The ascending aorta was normal in size.  ------------------------------------------------------------------- Mitral valve:   Mildly calcified annulus. Mildly calcified leaflets .  Doppler:   There was no evidence for stenosis.   There was trivial regurgitation.    Peak gradient (D): 3 mm Hg.  ------------------------------------------------------------------- Left atrium:  The atrium was moderately  dilated.  ------------------------------------------------------------------- Right ventricle:  The cavity size was normal. Systolic function was mildly reduced.  ------------------------------------------------------------------- Pulmonic valve:    Structurally normal valve.   Cusp separation was normal.  Doppler:  Transvalvular velocity was within the normal range. There was no regurgitation.  ------------------------------------------------------------------- Tricuspid valve:   Doppler:  There was trivial regurgitation.   ------------------------------------------------------------------- Right atrium:  The atrium was normal in size.  ------------------------------------------------------------------- Pericardium:  There was no pericardial effusion.  ------------------------------------------------------------------- Systemic veins: Inferior vena cava: The vessel was normal in size. The respirophasic diameter changes were in the normal range (>= 50%), consistent with normal central venous pressure.  ------------------------------------------------------------------- Measurements   Left ventricle                            Value          Reference  LV ID, ED, PLAX chordal                   43.7  mm       43 - 52  LV ID, ES, PLAX chordal                   25.7  mm       23 - 38  LV fx shortening, PLAX chordal            41    %        >=29  LV PW thickness, ED                       10.1  mm       ---------  IVS/LV PW ratio, ED               (H)     1.55           <=1.3  Stroke volume, 2D                         71    ml       ---------  Stroke volume/bsa, 2D                     33    ml/m^2   ---------  LV e&', lateral                            7.99  cm/s     ---------  LV E/e&',  lateral                          10.81          ---------  LV e&', medial                             6.58  cm/s     ---------  LV E/e&', medial                           13.13           ---------  LV e&', average                            7.29  cm/s     ---------  LV E/e&', average                          11.86          ---------    Ventricular septum                        Value          Reference  IVS thickness, ED                         15.7  mm       ---------    LVOT                                      Value          Reference  LVOT ID, S                                21    mm       ---------  LVOT area                                 3.46  cm^2     ---------  LVOT ID                                   21    mm       ---------  LVOT peak velocity, S                     82.7  cm/s     ---------  LVOT mean velocity, S                     59.9  cm/s     ---------  LVOT VTI, S                               20.4  cm       ---------  LVOT peak gradient, S  3     mm Hg    ---------  Stroke volume (SV), LVOT DP               70.7  ml       ---------  Stroke index (SV/bsa), LVOT DP            33.1  ml/m^2   ---------    Aortic valve                              Value          Reference  Aortic valve peak velocity, S             409   cm/s     ---------  Aortic valve mean velocity, S             296   cm/s     ---------  Aortic valve VTI, S                       102   cm       ---------  Aortic mean gradient, S                   39    mm Hg    ---------  Aortic peak gradient, S                   67    mm Hg    ---------  VTI ratio, LVOT/AV                        0.2            ---------  Aortic valve area, VTI                    0.69  cm^2     ---------  Aortic valve area/bsa, VTI                0.32  cm^2/m^2 ---------  Velocity ratio, peak, LVOT/AV             0.2            ---------  Aortic valve area, peak velocity          0.7   cm^2     ---------  Aortic valve area/bsa, peak               0.33  cm^2/m^2 ---------  velocity  Velocity ratio, mean, LVOT/AV             0.2            ---------  Aortic valve area, mean velocity          0.7   cm^2      ---------  Aortic valve area/bsa, mean               0.33  cm^2/m^2 ---------  velocity  Aortic regurg pressure half-time          581   ms       ---------    Aorta                                     Value          Reference  Aortic  root ID, ED                        37    mm       ---------    Left atrium                               Value          Reference  LA ID, A-P, ES                            55    mm       ---------  LA ID/bsa, A-P                    (H)     2.58  cm/m^2   <=2.2  LA volume, S                              90    ml       ---------  LA volume/bsa, S                          42.2  ml/m^2   ---------  LA volume, ES, 1-p A4C                    63    ml       ---------  LA volume/bsa, ES, 1-p A4C                29.6  ml/m^2   ---------  LA volume, ES, 1-p A2C                    107   ml       ---------  LA volume/bsa, ES, 1-p A2C                50.2  ml/m^2   ---------    Mitral valve                              Value          Reference  Mitral E-wave peak velocity               86.4  cm/s     ---------  Mitral A-wave peak velocity               92.8  cm/s     ---------  Mitral deceleration time                  204   ms       150 - 230  Mitral peak gradient, D                   3     mm Hg    ---------  Mitral E/A ratio, peak                    0.9            ---------    Pulmonary arteries  Value          Reference  PA pressure, S, DP                (H)     35    mm Hg    <=30    Tricuspid valve                           Value          Reference  Tricuspid regurg peak velocity            281   cm/s     ---------  Tricuspid peak RV-RA gradient             32    mm Hg    ---------    Right ventricle                           Value          Reference  RV s&', lateral, S                         10.5  cm/s     ---------  Legend: (L)  and  (H)  mark values outside specified reference  range.  ------------------------------------------------------------------- Prepared and Electronically Authenticated by  Loralie Champagne, M.D. 2019-03-20T14:05:32  Physicians   Panel Physicians Referring Physician Case Authorizing Physician  Martinique, Peter M, MD (Primary)    Procedures   RIGHT/LEFT HEART CATH AND CORONARY/GRAFT ANGIOGRAPHY  Conclusion     Ost LAD to Prox LAD lesion is 100% stenosed.  Prox LAD to Mid LAD lesion is 100% stenosed.  Ost Cx to Mid Cx lesion is 100% stenosed.  Prox RCA to Mid RCA lesion is 100% stenosed.  LIMA graft was visualized by angiography and is normal in caliber.  The graft exhibits no disease.  Seq SVG- OM 1 and OM 2 graft was visualized by angiography and is large.  SVG graft was visualized by angiography and is normal in caliber.  The graft exhibits no disease.  Dist Graft lesion is 30% stenosed.  Origin lesion is 35% stenosed.  There is severe aortic valve stenosis.  LV end diastolic pressure is mildly elevated.  Hemodynamic findings consistent with mild pulmonary hypertension.   1. Severe 3 vessel occlusive CAD 2. Patent LIMA to the mid LAD. This only supplies a septal perforator.  3. Patent SVG to mid to distal LAD 4. Patent sequential SVG to OM 1 and OM 2 5. Severe Aortic stenosis. Mean gradient 32 mm Hg, peak gradient 35 mm Hg. Valve area 1.1 cm squared. Index 0.56 6. Normal cardiac output 7. Mild pulmonary HTN with mildly elevated LV filling pressures.   Plan: Coronary circulation is unchanged from 2008. Patient has progressive symptomatic aortic stenosis. Will refer for TAVR evaluation.   Indications   Nonrheumatic aortic valve stenosis [I35.0 (ICD-10-CM)]  Procedural Details/Technique   Technical Details Indication: 77 yo WF with history of CAD s/p redo CABG presents with progressive aortic stenosis.  Procedural Details: The left wrist was prepped, draped, and anesthetized with 1% lidocaine. Using the  modified Seldinger technique a 6 Fr slender sheath was placed in the left radial artery and a 5 French sheath was placed in the right brachial vein. A Swan-Ganz catheter was used for the right heart catheterization. Standard protocol was followed for recording of right heart  pressures and sampling of oxygen saturations. Fick cardiac output was calculated. Standard Judkins catheters were used for selective coronary and graft angiography and left ventricular pressures. There were no immediate procedural complications. The patient was transferred to the post catheterization recovery area for further monitoring.  Contrast: 75 cc   Estimated blood loss <50 mL.  During this procedure the patient was administered the following to achieve and maintain moderate conscious sedation: Versed 1 mg, Fentanyl 25 mcg, while the patient's heart rate, blood pressure, and oxygen saturation were continuously monitored. The period of conscious sedation was 40 minutes, of which I was present face-to-face 100% of this time.  Complications   Complications documented before study signed (04/04/2017 9:59 AM EDT)    No complications were associated with this study.  Documented by Martinique, Peter M, MD - 04/04/2017 9:50 AM EDT    Coronary Findings   Diagnostic  Dominance: Right  Left Anterior Descending  Ost LAD to Prox LAD lesion 100% stenosed  Ost LAD to Prox LAD lesion is 100% stenosed.  Prox LAD to Mid LAD lesion 100% stenosed  Prox LAD to Mid LAD lesion is 100% stenosed.  Left Circumflex  Ost Cx to Mid Cx lesion 100% stenosed  Ost Cx to Mid Cx lesion is 100% stenosed.  Right Coronary Artery  Prox RCA to Mid RCA lesion 100% stenosed  Prox RCA to Mid RCA lesion is 100% stenosed.  Acute Marginal Branch  Collaterals  Acute Mrg filled by collaterals from 2nd Mrg.    Right Posterior Atrioventricular Branch  Collaterals  Post Atrio filled by collaterals from Dist LAD.    LIMA LIMA Graft to Prox LAD  LIMA graft  was visualized by angiography and is normal in caliber. The graft exhibits no disease.  Sequential jump graft Graft to 1st Mrg, 2nd Mrg  Seq SVG- OM 1 and OM 2 graft was visualized by angiography and is large.  saphenous Graft to Mid LAD  SVG graft was visualized by angiography and is normal in caliber. The graft exhibits no disease.  Origin lesion 35% stenosed  Origin lesion is 35% stenosed. The lesion is eccentric.  Dist Graft lesion 30% stenosed  Dist Graft lesion is 30% stenosed.  Intervention   No interventions have been documented.  Right Heart   Right Heart Pressures Hemodynamic findings consistent with mild pulmonary hypertension.  Left Heart   Left Ventricle LV end diastolic pressure is mildly elevated.  Aortic Valve There is severe aortic valve stenosis. The aortic valve is calcified.  Coronary Diagrams   Diagnostic Diagram       Implants    No implant documentation for this case.  MERGE Images   Show images for CARDIAC CATHETERIZATION   Link to Procedure Log   Procedure Log    Hemo Data    Most Recent Value  Fick Cardiac Output 6.5 L/min  Fick Cardiac Output Index 3.17 (L/min)/BSA  Aortic Mean Gradient 31.3 mmHg  Aortic Peak Gradient 35 mmHg  Aortic Valve Area 1.14  Aortic Value Area Index 0.56 cm2/BSA  RA A Wave 10 mmHg  RA V Wave 10 mmHg  RA Mean 8 mmHg  RV Systolic Pressure 43 mmHg  RV Diastolic Pressure 2 mmHg  RV EDP 10 mmHg  PA Systolic Pressure 42 mmHg  PA Diastolic Pressure 16 mmHg  PA Mean 25 mmHg  PW A Wave 18 mmHg  PW V Wave 19 mmHg  PW Mean 14 mmHg  AO Systolic Pressure 626 mmHg  AO Diastolic  Pressure 58 mmHg  AO Mean 84 mmHg  LV Systolic Pressure 382 mmHg  LV Diastolic Pressure 7 mmHg  LV EDP 22 mmHg  Arterial Occlusion Pressure Extended Systolic Pressure 505 mmHg  Arterial Occlusion Pressure Extended Diastolic Pressure 58 mmHg  Arterial Occlusion Pressure Extended Mean Pressure 82 mmHg  Left Ventricular Apex Extended Systolic  Pressure 397 mmHg  Left Ventricular Apex Extended Diastolic Pressure 10 mmHg  Left Ventricular Apex Extended EDP Pressure 22 mmHg  QP/QS 1  TPVR Index 7.88 HRUI  TSVR Index 26.48 HRUI  PVR SVR Ratio 0.14  TPVR/TSVR Ratio 0.3    ADDENDUM REPORT: 04/18/2017 11:18  CLINICAL DATA:  Aortic stenosis  EXAM: Cardiac TAVR CT  TECHNIQUE: The patient was scanned on a [employer] 192 scanner. A 120 kV retrospective scan was triggered in the descending thoracic aorta at 111 HU's. Gantry rotation speed was 270 msecs and collimation was .9 mm. No beta blockade or nitro were given. The 3D data set was reconstructed in 5% intervals of the R-R cycle. Systolic and diastolic phases were analyzed on a dedicated work station using MPR, MIP and VRT modes. The patient received 80 cc of contrast.  FINDINGS: Aortic Valve: Calcified tri leaflet with restricted motion  Aorta: Moderate calcific aortic atherosclerosis with normal arch vessels and no aneurysm  Sinotubular Junction: 24 mm  Ascending Thoracic Aorta: 32 mm  Aortic Arch: 30 mm  Descending Thoracic Aorta: 28 mm  Sinus of Valsalva Measurements:  Non-coronary: 29 mm  Right - coronary: 28.3 mm  Left - coronary: 28.4 mm  Coronary Artery Height above Annulus:  Left Main: 10 mm above annulus  Right Coronary: 12 mm above annulus  Patent LIMA to LAD  Patent SVG to LAD  Patent SVG to OM  Virtual Basal Annulus Measurements:  Maximum/Minimum Diameter: 26.5 mm x 21.2 mm  Perimeter: 75.6 mm  Area: 416 mm2  Coronary Arteries: Sufficient height above annulus for deployment with patent left sided grafts  Optimum Fluoroscopic Angle for Delivery: LAO 9 degrees Cranial 7 degrees  IMPRESSION: 1. Calcified tri leaflet AV with annular area of 416 mm2 suitable for a 23 mm Sapien 3 valve  2. Coronary arteries sufficient height above annulus for deployment with patent left sided grafts  3.  No LAA  Thrombus  4. Optimum angiographic angle for deployment LAO 9 degrees Cranial 7 degrees  5. Normal aortic root 3.2 cm with moderate calcific aortic atherosclerosis  Jenkins Rouge   Electronically Signed   By: Jenkins Rouge M.D.   On: 04/18/2017 11:18  CLINICAL DATA:  77 year old female with history of severe aortic stenosis. Preprocedural study prior to potential transcatheter aortic valve replacement (TAVR) procedure.  EXAM: CT ANGIOGRAPHY CHEST, ABDOMEN AND PELVIS  TECHNIQUE: Multidetector CT imaging through the chest, abdomen and pelvis was performed using the standard protocol during bolus administration of intravenous contrast. Multiplanar reconstructed images and MIPs were obtained and reviewed to evaluate the vascular anatomy.  CONTRAST:  115mL ISOVUE-370 IOPAMIDOL (ISOVUE-370) INJECTION 76%  COMPARISON:  CT the abdomen and pelvis 04/15/2011. No prior chest CT.  FINDINGS: CTA CHEST FINDINGS  Cardiovascular: Heart size is enlarged with severe left atrial dilatation. There is no significant pericardial fluid, thickening or pericardial calcification. There is aortic atherosclerosis, as well as atherosclerosis of the great vessels of the mediastinum and the coronary arteries, including calcified atherosclerotic plaque in the left anterior descending, left circumflex and right coronary arteries. Status post median sternotomy for CABG. Severe thickening calcification of the aortic valve.  Mild calcification of the mitral annulus.  Mediastinum/Lymph Nodes: No pathologically enlarged mediastinal or hilar lymph nodes. Small hiatal hernia. Esophagus is otherwise unremarkable in appearance. No axillary lymphadenopathy.  Lungs/Pleura: No suspicious appearing pulmonary nodules or masses. No acute consolidative airspace disease. No pleural effusions.  Musculoskeletal/Soft Tissues: Median sternotomy wires. 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 There are no aggressive appearing lytic or blastic lesions noted in the visualized portions of the skeleton.  CTA ABDOMEN AND PELVIS FINDINGS  Hepatobiliary: No suspicious cystic or solid hepatic lesions. No intra or extrahepatic biliary ductal dilatation. Status post cholecystectomy.  Pancreas: No pancreatic mass. No pancreatic ductal dilatation. No pancreatic or peripancreatic fluid or inflammatory changes.  Spleen: Unremarkable.  Adrenals/Urinary Tract: 3.0 x 4.1 x 3.1 cm enhancing mass in the interpolar region of the right kidney (axial image 100 of series 15 and coronal image 67 of series 16). This lesion is new compared to prior study from 04/15/2011, does not extend into the right renal vein (which is widely patent), and is completely encapsulated within Gerota's fascia. Several subcentimeter low-attenuation lesions in both kidneys, too small to definitively characterize, but favored to represent tiny cysts. No hydroureteronephrosis. Urinary bladder is normal in appearance. Bilateral adrenal glands are normal in appearance.  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 No pathologic dilatation of small bowel or colon. Numerous colonic diverticulae are noted, particularly in the region of the sigmoid colon, without surrounding inflammatory changes to suggest an acute diverticulitis at this time. The appendix is not confidently identified and may be surgically absent. Regardless, there are no inflammatory changes noted adjacent to the cecum to suggest the presence of an acute appendicitis at this time.  Vascular/Lymphatic: Aortic atherosclerosis with fusiform aneurysmal dilatation of the infrarenal abdominal aorta which measures up to 3.4 x 3.0 cm (axial image 113 of series 15). 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 No lymphadenopathy noted in the abdomen or pelvis.  Reproductive: Uterus and ovaries are a trophic and otherwise unremarkable in appearance.  Other: No significant volume of ascites.  No pneumoperitoneum.  Musculoskeletal: Chronic appearing compression fracture of L1 with 20% loss of anterior vertebral body height. There are no aggressive appearing lytic or blastic lesions noted in the visualized portions of the skeleton.  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  LEFT PELVIS:  Left Common Iliac Artery -  Minimal Diameter-6.7 x 5.5 mm  Tortuosity-mild  Calcification-moderate  Left External Iliac Artery -  Minimal Diameter-6.2 x 6.1 mm  Tortuosity-mild  Calcification-mild-to-moderate  Left Common Femoral Artery -  Minimal Diameter-7.2 x 7.3 mm  Tortuosity-mild  Calcification-moderate  Review of the MIP images confirms the above findings.  IMPRESSION: 1. Vascular findings and measurements pertinent to potential TAVR procedure, as detailed above. 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 2. Severe thickening calcification of the aortic valve, compatible with the reported clinical history of severe aortic stenosis. 3. New large right renal mass measuring 3.0 x 4.1 x 3.1 cm highly  concerning for renal cell carcinoma. This is completely encapsulated within Gerota's fascia, does not involve the right renal vein, and is not associated with local lymphadenopathy or  definite findings to suggest metastatic disease in the chest, abdomen or pelvis. Urologic consultation is recommended in the near future for further evaluation. 4. Aortic atherosclerosis, in addition to left main and 3 vessel coronary artery disease. Status post median sternotomy for CABG. 5. In addition, there is an infrarenal abdominal aortic aneurysm measuring up to 3.4 x 3.0 cm (mean diameter of 3.2 cm). Recommend followup by ultrasound in 3 years. This recommendation follows ACR consensus guidelines: White Paper of the ACR Incidental Findings Committee II on Vascular Findings. J Am Coll Radiol 2013; 10:789-794. 6. Cardiomegaly with severe left atrial dilatation. 7. Mild colonic diverticulosis without evidence to suggest an acute diverticulitis at this time. 8. Additional incidental findings, as above.  Aortic Atherosclerosis (ICD10-I70.0). Aortic aneurysm NOS (ICD10-I71.9).   Electronically Signed   By: Vinnie Langton M.D.   On: 04/18/2017 11:51   Impression:  This 77 year old woman has stage D, severe, symptomatic aortic stenosis with [place] Heart Association class III symptoms of exertional shortness of breath, fatigue, and orthopnea consistent with chronic diastolic heart failure.  I have personally reviewed her recent 2D echocardiogram, cardiac catheterization, and CTA studies.  Her echocardiogram shows a trileaflet aortic valve with severe thickening, calcification, and restricted leaflet mobility with a peak velocity across the aortic valve of 4.1 m/s consistent with severe aortic stenosis.  Cardiac catheterization confirmed severe aortic stenosis and shows severe native three-vessel coronary disease with continued patency of her bypass grafts.  I agree that aortic valve replacement is indicated in this patient with progressive symptoms of severe aortic stenosis.  She would be at high risk for open surgical aortic valve replacement given her 2 prior sternotomies for  coronary artery bypass graft surgery and I would not consider her a candidate for open surgery.  I think transcatheter aortic valve replacement would be the best alternative for her.  Her gated cardiac CTA shows anatomy consistent with severe aortic stenosis and amenable to transcatheter aortic valve replacement without any complicating features.  Her abdominal and pelvic CTA shows severe aortoiliac atherosclerosis with an occluded right common iliac artery.  There does appear to be adequate vasculature to allow left transfemoral insertion.  This would require a radial artery approach for insertion of a diagnostic pigtail catheter in the aortic root.  Her CTA of the abdomen also shows an incidental 3 x 4 cm mass in the right kidney consistent with a renal cell carcinoma.  There is no evidence of local invasion outside the kidney and no evidence of locally advanced or distant metastatic disease.  The she has an appointment to see Dr. Alinda Money of [facility] early next month for further evaluation of this mass.  The patient was counseled at length regarding treatment alternatives for management of severe symptomatic aortic stenosis. The risks and benefits of surgical intervention have been discussed in detail. Long-term prognosis with medical therapy was discussed. Alternative approaches such as conventional surgical aortic valve replacement, transcatheter aortic valve replacement, and palliative medical therapy were compared and contrasted at length. This discussion was placed in the context of the patient's own specific clinical presentation and past medical history. All of her questions been addressed.   Following the decision to proceed with transcatheter aortic valve replacement, a discussion was held regarding what types of management strategies would be attempted intraoperatively in the event of  life-threatening complications, including whether or not the patient would be considered a candidate for the  use of cardiopulmonary bypass and/or conversion to open sternotomy for attempted surgical intervention. The patient has been advised of a variety of complications that might develop including but not limited to risks of death, stroke, paravalvular leak, aortic dissection or other major vascular complications, aortic annulus rupture, device embolization, cardiac rupture or perforation, mitral regurgitation, acute myocardial infarction, arrhythmia, heart block or bradycardia requiring permanent pacemaker placement, congestive heart failure, respiratory failure, renal failure, pneumonia, infection, other late complications related to structural valve deterioration or migration, or other complications that might ultimately cause a temporary or permanent loss of functional independence or other long term morbidity. The patient provides full informed consent for the procedure as described and all questions were answered.     Plan:  Transcatheter aortic valve replacement via left transfemoral approach with radial artery insertion of a diagnostic pigtail catheter on 05/09/2017.   I spent 60 minutes performing this consultation and > 50% of this time was spent face to face counseling and coordinating the care of this patient's severe symptomatic aortic stenosis.    Gaye Pollack, MD 04/26/2017

## 2017-04-26 NOTE — Patient Instructions (Addendum)
You are scheduled for Pre Admission Testing on Friday, May 05, 2017 at 9:00 AM.  Please check into Admitting at 8:45 AM at [facility] ([place], Valet parking).  There are no restrictions for this appointment.   Tuesday, May 09, 2017-TAVR instructions:  Please arrive in Admitting at [facility] at 8:00 AM for check in.  Continue taking all current medications without change through the day before surgery.  Have nothing to eat or drink after midnight the night before surgery.  On the morning of surgery do not take any medications except Levothyroxine with a sip of water.

## 2017-04-26 NOTE — Progress Notes (Signed)
Patient ID: [name], female   DOB: 03/19/1940, 77 y.o.   MRN: [id number]  [facility] REPORT  Referring Provider is Martinique, Peter M, MD PCP is [name], MD  Chief Complaint  Patient presents with  . 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 Redmond Pulling and redo CABG x3 by Dr. Servando Snare in 2001.  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  Over the past year she has had progression of exertional shortness of breath and fatigue.  She said this point she can get outside to walk and has become very sedentary.  She has had some orthopnea when lying down at night causing her to sit upright in bed.  She has had mild lower extremity edema recently and has noted weight gain over the past 6 months.  She denies any chest discomfort.  She denies dizziness and syncope.  She had a follow-up echocardiogram in March 2019 which showed a peak velocity across aortic valve of 4.1 m/s corresponding to a mean transvalvular gradient of 39 mmHg.  The dimensionless index was 0.2.  Aortic valve area was 0.69 cm.  Left ventricular ejection fraction was 55-60%.  Cardiac catheterization on April 04, 2017 showed a peak to peak gradient of 35 mmHg and a mean gradient of 32 mmHg.  There was severe native three-vessel coronary disease with continued patency of her left internal mammary graft to the mid LAD, patency of a free right internal mammary graft to the distal LAD, and a patent sequential saphenous vein graft to the first and second obtuse marginal branches of the left circumflex coronary  artery.  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  She is married and lives with her husband in [place].  She has a daughter who lives with her.  She has been very active until the past 6-12 months and was limited only by some chronic pain in her right knee and claudication in the right calf.  She said that her main limitation now is shortness of breath and fatigue with low level activity.  Past Medical History:  Diagnosis Date  . Abdominal aortic aneurysm ([facility])    infrarenal diagnosed with maximum measurement at 3.3 cm  . Depression   . Diverticulosis   . GERD (gastroesophageal reflux disease)   . History of tobacco abuse    Recurrent cigarette smoking  . Hyperlipidemia   . Hyperplastic colon polyp   . Hypertension   . Hypothyroidism   . Ischemic heart disease    had redo bypass surgery in 2001, at that time she had a free right internal mammary to the LAD, LAD endarterectomy, sequential vein graft to the OM1 and OM2 -- last catheterization was in 2008  . Leg fracture    remote right leg fracture  . Microscopic colitis   . Obesity    with gastric bypass surgery in 2004  . PAD (peripheral artery disease) ([facility])    right iliac occlusion  . PONV (postoperative nausea and vomiting)   . S/P CABG x 4 1985   LIMA to LAD, Sequential SVG  to OM1-OM2, SVG to RCA - Dr Redmond Pulling  . S/P redo CABG x 3 2001   Free RIMA to LAD with endarterectomy, sequential SVG to OM1-OM2 - Dr Servando Snare    Past Surgical History:  Procedure Laterality Date  . CARDIAC CATHETERIZATION  02/16/1999   Left heart catheterization with selective coronary  angiography, left ventricular angiography (RAO and LAO views), angiography of left internal mammary artery, and angiography of saphenous vein grafts  . CARDIAC CATHETERIZATION  10/20/2006   Est. EF of 55% -- Totally occuladed native coronary circulation -- Persistent patency of the right mammary graft and the distal LAD artery with peristent patency of the  left mammary graft to the proximal LAD artery with persistant patency of the saphenous vein graft to the sequentail branches of the OM        . CARPAL TUNNEL RELEASE    . CHOLECYSTECTOMY    . COLONOSCOPY  07/12/2007   Dr.Brodie- normal cecum and rectum, diverticulosis in the sigmoid colon  . COLONOSCOPY  1991   per Dr.Brodie office visit note= hyperplastic polyp  . COLONOSCOPY N/A 09/30/2014   Dr.Rourk- noraml appearing rectal mucosa, scattered L sided diverticula, colonic mucosa has a somewhat pale friable appearence diffusely, no ulcers or erosions seen, the distal 10cm of terminal ileum appeared entirely normal. bx=benign colorectal mucosa with lymphocytic colitis  . CORONARY ARTERY BYPASS GRAFT  1985  . CORONARY ARTERY BYPASS GRAFT  03/09/1999   Redo CABG x3 --  with the free right internal mammary artey to the LAD with an LAD endarterectomy -- Sequential saphenous vein graft to OM1 and OM2                   . ESOPHAGOGASTRODUODENOSCOPY N/A 09/30/2014   Dr.Rourk-mild erosive reflux esophagitis s/p bariatric surgery  . GASTRIC BYPASS  2004  . RIGHT/LEFT HEART CATH AND CORONARY/GRAFT ANGIOGRAPHY N/A 04/04/2017   Procedure: RIGHT/LEFT HEART CATH AND CORONARY/GRAFT ANGIOGRAPHY;  Surgeon: Martinique, Peter M, MD;  Location: [facility];  Service: Cardiovascular;  Laterality: N/A;    Family History  Problem Relation Age of Onset  . Stroke Father   . Diabetes Mother   . Pancreatic cancer Mother     Social History   Socioeconomic History  . Marital status: Married    Spouse name: Not on file  . Number of children: Not on file  . Years of education: Not on file  . Highest education level: Not on file  Occupational History  . Occupation: reitred    Fish farm manager: [employer]  Social Needs  . Financial resource strain: Not on file  . Food insecurity:    Worry: Not on file    Inability: Not on file  . Transportation needs:    Medical: Not on file    Non-medical: Not on file    Tobacco Use  . Smoking status: Current Every Day Smoker    Packs/day: 0.25    Types: Cigarettes    Start date: 01/10/1985    Last attempt to quit: 02/11/2016    Years since quitting: 1.2  . Smokeless tobacco: Never Used  . Tobacco comment: smokes 1-2 cigarettes a day  Substance and Sexual Activity  . Alcohol use: No    Alcohol/week: 0.0 oz  . Drug use: No  . Sexual activity: Never  Lifestyle  . Physical activity:    Days per week: Not on file    Minutes per session: Not on file  . Stress: Not on file  Relationships  . Social connections:    Talks on phone: Not on file    Gets together: Not on file    Attends religious service: Not on file    Active member of club or organization: Not on file    Attends meetings of clubs or organizations: Not on file    Relationship status: Not on file  . Intimate partner violence:    Fear of current or ex partner: Not on file    Emotionally abused: Not on file    Physically abused: Not on file    Forced sexual activity: Not on file  Other Topics Concern  . Not on file  Social History Narrative  . Not on file    Current Outpatient Medications  Medication Sig Dispense Refill  . acetaminophen (TYLENOL) 500 MG tablet Take 1,000 mg by mouth every 6 (six) hours as needed for moderate pain or headache.     . ALPRAZolam (XANAX) 0.25 MG tablet Take 0.25 mg by mouth 3 (three) times daily as needed for anxiety.  0  . amLODipine (NORVASC) 10 MG tablet Take 1 tablet (10 mg total) by mouth daily. 30 tablet 6  . aspirin EC 81 MG tablet Take 81 mg by mouth at bedtime.     . chlorthalidone (HYGROTON) 25 MG tablet Take 1/2 tablet ( 12.5 mg ) daily (Patient taking differently: Take 12.5 mg by mouth daily. ) 30 tablet 6  . FLUoxetine (PROZAC) 40 MG capsule TAKE ONE CAPSULE BY MOUTH EVERY DAY (Patient taking differently: Take 40 mg by mouth in the evening) 90 capsule 1  . furosemide (LASIX) 20 MG tablet TAKE 1 TABLET BY MOUTH EVERY DAY AS NEEDED FOR EDEMA  (Patient taking differently: TAKE 20 MG BY MOUTH EVERY DAY AS NEEDED FOR EDEMA) 30 tablet 11  . irbesartan (AVAPRO) 300 MG tablet TAKE 1 TABLET BY MOUTH EVERY DAY (Patient taking differently: Take 300 mg by mouth every day) 30 tablet 4  . levothyroxine (SYNTHROID, LEVOTHROID) 50 MCG tablet TAKE 1 TABLET BY MOUTH EVERY DAY BEFORE BREAKFAST TAKE AN EXTRA 1/2 TAB ONE TIME EACH WEEK (Patient taking differently: Take 50 mcg by mouth every morning before breakfast) 96 tablet 4  . loperamide (IMODIUM) 2 MG capsule Take 2 mg by mouth as needed for diarrhea or loose stools. Reported on 05/01/2015    . Nebivolol HCl (BYSTOLIC) 20 MG TABS Take 1 tablet (20 mg total) by mouth daily. (Patient taking differently: Take 20 mg by mouth every evening. ) 90 tablet 2  . nitroGLYCERIN (NITROSTAT) 0.4 MG SL tablet Place 0.4 mg under the tongue every 5 (five) minutes as needed for chest pain (MAX 3 TABLETS).    . pantoprazole (PROTONIX) 40 MG tablet Take 1 tablet (40 mg total) by mouth daily. (Patient taking differently: Take 40 mg by mouth every evening. ) 30 tablet 11  . PROAIR HFA 108 (90 Base) MCG/ACT inhaler Inhale 2 puffs into the lungs every 4 (four) hours as needed for shortness of breath or wheezing.  2  . simvastatin (ZOCOR) 40 MG tablet TAKE 1 TABLET OP AT BEDTIME (Patient taking differently: Take 40 mg by mouth at bedtime) 90 tablet 2  . SYMBICORT 160-4.5 MCG/ACT inhaler Inhale 1 puff into the lungs 2 (two) times daily.  2   No current facility-administered medications for this visit.     Allergies  Allergen Reactions  . Dipyridamole Hives  . Chantix [Varenicline Tartrate] Hives      Review of Systems:  General:  normal appetite, decreased energy, + weight gain, no weight loss, no fever  Cardiac:  no chest pain with exertion, no chest pain at rest, +SOB with mild exertion, occasional resting SOB, + PND, + orthopnea, no palpitations, no arrhythmia, no atrial fibrillation, + LE edema, no dizzy spells, no  syncope  Respiratory:  + shortness of breath, no home oxygen, no productive cough, no dry cough, no bronchitis, no wheezing, no hemoptysis, no asthma, no pain with inspiration or cough, no sleep apnea, no CPAP at night  GI:   no difficulty swallowing, no reflux, no frequent heartburn, no hiatal hernia, no abdominal pain, no constipation, no diarrhea, no hematochezia, no hematemesis, no melena  GU:   no dysuria,  no frequency, no urinary tract infection, no hematuria, no kidney stones, no kidney disease  Vascular:  + pain suggestive of claudication in right calf, no pain in feet, no leg cramps, no varicose veins, no DVT, no non-healing foot ulcer  Neuro:   no stroke, no TIA's, no seizures, no headaches, no temporary blindness one eye,  no slurred speech, no peripheral neuropathy, + chronic pain, no instability of gait, no memory/cognitive dysfunction  Musculoskeletal: + arthritis, + joint swelling, no myalgias, no difficulty walking, normal mobility   Skin:   no rash, no itching, no skin infections, no pressure sores or ulcerations  Psych:   no anxiety, no depression, no nervousness, no unusual recent stress  Eyes:   no blurry vision, no floaters, no recent vision changes, does not wear glasses or contacts  ENT:   n hearing loss, no loose or painful teeth, no dentures, last saw dentist recently  Hematologic:  + easy bruising, no abnormal bleeding, no clotting disorder, no frequent epistaxis  Endocrine:  no diabetes, does not check CBG's at home        Physical Exam:   BP (!) 117/54 (BP Location: Right Arm, Patient Position: Sitting, Cuff Size: Large)   Pulse 67   Resp 16   Ht 5' 7.75" (1.721 m)   Wt 212 lb (96.2 kg)   SpO2 97% Comment: ON RA  BMI 32.47 kg/m   General:  Obese,  well-appearing  HEENT:  Unremarkable, NCAT, PERLA, EOMI, Oropharynx clear  Neck:   no JVD, no bruits, no adenopathy or thyromegaly  Chest:   clear to auscultation, symmetrical breath sounds, no wheezes, no rhonchi     CV:   RRR, grade III/VI crescendo/decrescendo murmur heard best at RSB,  no diastolic murmur  Abdomen:  soft, non-tender, no masses or organomegaly  Extremities:  warm, well-perfused, pedal pulse not palpable on right, diminished but palpable on left, no LE edema  Rectal/GU  Deferred  Neuro:   Grossly non-focal and symmetrical throughout  Skin:   Clean and dry, no rashes, no breakdown   Diagnostic Tests:         [facility]                        [address], [place] [address]                            [phone]  ------------------------------------------------------------------- Transthoracic Echocardiography  Patient:    Jillann, Charette MR #:       [id number] Study Date:  03/29/2017 Gender:     F Age:        45 Height:     167.6 cm Weight:     94.3 kg BSA:        2.13 m^2 Pt. Status: Room:   ATTENDING    Peter Martinique, M.D.  ORDERING     Peter Martinique, M.D.  REFERRING    Peter Martinique, M.D.  SONOGRAPHER  Cindy Hazy, RDCS  PERFORMING   [facility], Outpatient  cc:  ------------------------------------------------------------------- LV EF: 55% -   60%  ------------------------------------------------------------------- Indications:      I35.9 Aortic Valve Disorder.  ------------------------------------------------------------------- History:   PMH:  Acquired from the patient and from the patient&'s chart.  PMH:  AAA. Hypothyroidism. Ischemic heart disease. Peripheral artery disease.  Risk factors:  Current tobacco use. Hypertension. Obese. Dyslipidemia.  ------------------------------------------------------------------- Study Conclusions  - Left ventricle: The cavity size was normal. Wall thickness was   increased in a pattern of mild LVH. Systolic function was normal.   The estimated ejection fraction was in the range of 55% to 60%.   Wall motion was normal; there were no regional wall motion    abnormalities. Doppler parameters are consistent with abnormal   left ventricular relaxation (grade 1 diastolic dysfunction). - Aortic valve: Poorly visualized. Probably trileaflet; severely   calcified leaflets. There was severe stenosis. There was mild   regurgitation. Mean gradient (S): 41 mm Hg. Peak gradient (S): 67   mm Hg. Valve area (VTI): 0.69 cm^2. - Mitral valve: Mildly calcified annulus. Mildly calcified leaflets   . There was trivial regurgitation. - Left atrium: The atrium was moderately dilated. - Right ventricle: The cavity size was normal. Systolic function   was mildly reduced. - Tricuspid valve: Peak RV-RA gradient (S): 32 mm Hg. - Pulmonary arteries: PA peak pressure: 35 mm Hg (S). - Inferior vena cava: The vessel was normal in size. The   respirophasic diameter changes were in the normal range (>= 50%),   consistent with normal central venous pressure.  Impressions:  - Normal LV size with mild LV hypertrophy. EF 55-60%. Severe aortic   stenosis, mild aortic insufficiency. Moderate left atrial   enlargement. Normal RV size with mildly decreased systolic   function.  ------------------------------------------------------------------- Labs, prior tests, procedures, and surgery: Gastric Bypass-2004.  Coronary artery bypass grafting.  ------------------------------------------------------------------- Study data:   Study status:  Routine.  Procedure:  The patient reported no pain pre or post test. Transthoracic echocardiography for left ventricular function evaluation, for right ventricular function evaluation, and for assessment of valvular function. Image quality was adequate.  Study completion:  There were no complications.          Transthoracic echocardiography.  M-mode, complete 2D, spectral Doppler, and color Doppler.  Birthdate: Patient birthdate: October 12, 1940.  Age:  Patient is 77 yr old.  Sex: Gender: female.    BMI: 33.6 kg/m^2.  Blood pressure:      116/54 Patient status:  Outpatient.  Study date:  Study date: 03/29/2017. Study time: 10:02 AM.  Location:  [facility]  -------------------------------------------------------------------  ------------------------------------------------------------------- Left ventricle:  The cavity size was normal. Wall thickness was increased in a pattern of mild LVH. Systolic function was normal. The estimated ejection fraction was in the range of 55% to 60%. Wall motion was normal; there were no regional wall motion abnormalities. Doppler parameters are consistent with abnormal left ventricular relaxation (grade 1 diastolic dysfunction).  ------------------------------------------------------------------- Aortic valve:  Poorly visualized.  Probably trileaflet; severely calcified leaflets.  Doppler:   There was severe stenosis.   There was mild regurgitation.    VTI ratio of LVOT to aortic valve: 0.2. Valve area (VTI): 0.69 cm^2. Indexed valve area (VTI): 0.32 cm^2/m^2. Peak velocity ratio of LVOT to aortic valve: 0.2. Valve area (Vmax): 0.7 cm^2. Indexed valve area (Vmax): 0.33 cm^2/m^2. Mean velocity ratio of LVOT to aortic valve: 0.2. Valve area (Vmean): 0.7 cm^2. Indexed valve area (Vmean): 0.33 cm^2/m^2. Mean gradient (S): 41 mm Hg. Peak gradient (S): 67 mm Hg.  ------------------------------------------------------------------- Aorta:  Aortic root: The aortic root was normal in size. Ascending aorta: The ascending aorta was normal in size.  ------------------------------------------------------------------- Mitral valve:   Mildly calcified annulus. Mildly calcified leaflets .  Doppler:   There was no evidence for stenosis.   There was trivial regurgitation.    Peak gradient (D): 3 mm Hg.  ------------------------------------------------------------------- Left atrium:  The atrium was moderately  dilated.  ------------------------------------------------------------------- Right ventricle:  The cavity size was normal. Systolic function was mildly reduced.  ------------------------------------------------------------------- Pulmonic valve:    Structurally normal valve.   Cusp separation was normal.  Doppler:  Transvalvular velocity was within the normal range. There was no regurgitation.  ------------------------------------------------------------------- Tricuspid valve:   Doppler:  There was trivial regurgitation.   ------------------------------------------------------------------- Right atrium:  The atrium was normal in size.  ------------------------------------------------------------------- Pericardium:  There was no pericardial effusion.  ------------------------------------------------------------------- Systemic veins: Inferior vena cava: The vessel was normal in size. The respirophasic diameter changes were in the normal range (>= 50%), consistent with normal central venous pressure.  ------------------------------------------------------------------- Measurements   Left ventricle                            Value          Reference  LV ID, ED, PLAX chordal                   43.7  mm       43 - 52  LV ID, ES, PLAX chordal                   25.7  mm       23 - 38  LV fx shortening, PLAX chordal            41    %        >=29  LV PW thickness, ED                       10.1  mm       ---------  IVS/LV PW ratio, ED               (H)     1.55           <=1.3  Stroke volume, 2D                         71    ml       ---------  Stroke volume/bsa, 2D                     33    ml/m^2   ---------  LV e&', lateral                            7.99  cm/s     ---------  LV E/e&',  lateral                          10.81          ---------  LV e&', medial                             6.58  cm/s     ---------  LV E/e&', medial                           13.13           ---------  LV e&', average                            7.29  cm/s     ---------  LV E/e&', average                          11.86          ---------    Ventricular septum                        Value          Reference  IVS thickness, ED                         15.7  mm       ---------    LVOT                                      Value          Reference  LVOT ID, S                                21    mm       ---------  LVOT area                                 3.46  cm^2     ---------  LVOT ID                                   21    mm       ---------  LVOT peak velocity, S                     82.7  cm/s     ---------  LVOT mean velocity, S                     59.9  cm/s     ---------  LVOT VTI, S                               20.4  cm       ---------  LVOT peak gradient, S  3     mm Hg    ---------  Stroke volume (SV), LVOT DP               70.7  ml       ---------  Stroke index (SV/bsa), LVOT DP            33.1  ml/m^2   ---------    Aortic valve                              Value          Reference  Aortic valve peak velocity, S             409   cm/s     ---------  Aortic valve mean velocity, S             296   cm/s     ---------  Aortic valve VTI, S                       102   cm       ---------  Aortic mean gradient, S                   39    mm Hg    ---------  Aortic peak gradient, S                   67    mm Hg    ---------  VTI ratio, LVOT/AV                        0.2            ---------  Aortic valve area, VTI                    0.69  cm^2     ---------  Aortic valve area/bsa, VTI                0.32  cm^2/m^2 ---------  Velocity ratio, peak, LVOT/AV             0.2            ---------  Aortic valve area, peak velocity          0.7   cm^2     ---------  Aortic valve area/bsa, peak               0.33  cm^2/m^2 ---------  velocity  Velocity ratio, mean, LVOT/AV             0.2            ---------  Aortic valve area, mean velocity          0.7   cm^2      ---------  Aortic valve area/bsa, mean               0.33  cm^2/m^2 ---------  velocity  Aortic regurg pressure half-time          581   ms       ---------    Aorta                                     Value          Reference  Aortic  root ID, ED                        37    mm       ---------    Left atrium                               Value          Reference  LA ID, A-P, ES                            55    mm       ---------  LA ID/bsa, A-P                    (H)     2.58  cm/m^2   <=2.2  LA volume, S                              90    ml       ---------  LA volume/bsa, S                          42.2  ml/m^2   ---------  LA volume, ES, 1-p A4C                    63    ml       ---------  LA volume/bsa, ES, 1-p A4C                29.6  ml/m^2   ---------  LA volume, ES, 1-p A2C                    107   ml       ---------  LA volume/bsa, ES, 1-p A2C                50.2  ml/m^2   ---------    Mitral valve                              Value          Reference  Mitral E-wave peak velocity               86.4  cm/s     ---------  Mitral A-wave peak velocity               92.8  cm/s     ---------  Mitral deceleration time                  204   ms       150 - 230  Mitral peak gradient, D                   3     mm Hg    ---------  Mitral E/A ratio, peak                    0.9            ---------    Pulmonary arteries  Value          Reference  PA pressure, S, DP                (H)     35    mm Hg    <=30    Tricuspid valve                           Value          Reference  Tricuspid regurg peak velocity            281   cm/s     ---------  Tricuspid peak RV-RA gradient             32    mm Hg    ---------    Right ventricle                           Value          Reference  RV s&', lateral, S                         10.5  cm/s     ---------  Legend: (L)  and  (H)  mark values outside specified reference  range.  ------------------------------------------------------------------- Prepared and Electronically Authenticated by  Loralie Champagne, M.D. 2019-03-20T14:05:32  Physicians   Panel Physicians Referring Physician Case Authorizing Physician  Martinique, Peter M, MD (Primary)    Procedures   RIGHT/LEFT HEART CATH AND CORONARY/GRAFT ANGIOGRAPHY  Conclusion     Ost LAD to Prox LAD lesion is 100% stenosed.  Prox LAD to Mid LAD lesion is 100% stenosed.  Ost Cx to Mid Cx lesion is 100% stenosed.  Prox RCA to Mid RCA lesion is 100% stenosed.  LIMA graft was visualized by angiography and is normal in caliber.  The graft exhibits no disease.  Seq SVG- OM 1 and OM 2 graft was visualized by angiography and is large.  SVG graft was visualized by angiography and is normal in caliber.  The graft exhibits no disease.  Dist Graft lesion is 30% stenosed.  Origin lesion is 35% stenosed.  There is severe aortic valve stenosis.  LV end diastolic pressure is mildly elevated.  Hemodynamic findings consistent with mild pulmonary hypertension.   1. Severe 3 vessel occlusive CAD 2. Patent LIMA to the mid LAD. This only supplies a septal perforator.  3. Patent SVG to mid to distal LAD 4. Patent sequential SVG to OM 1 and OM 2 5. Severe Aortic stenosis. Mean gradient 32 mm Hg, peak gradient 35 mm Hg. Valve area 1.1 cm squared. Index 0.56 6. Normal cardiac output 7. Mild pulmonary HTN with mildly elevated LV filling pressures.   Plan: Coronary circulation is unchanged from 2008. Patient has progressive symptomatic aortic stenosis. Will refer for TAVR evaluation.   Indications   Nonrheumatic aortic valve stenosis [I35.0 (ICD-10-CM)]  Procedural Details/Technique   Technical Details Indication: 77 yo WF with history of CAD s/p redo CABG presents with progressive aortic stenosis.  Procedural Details: The left wrist was prepped, draped, and anesthetized with 1% lidocaine. Using the  modified Seldinger technique a 6 Fr slender sheath was placed in the left radial artery and a 5 French sheath was placed in the right brachial vein. A Swan-Ganz catheter was used for the right heart catheterization. Standard protocol was followed for recording of right heart  pressures and sampling of oxygen saturations. Fick cardiac output was calculated. Standard Judkins catheters were used for selective coronary and graft angiography and left ventricular pressures. There were no immediate procedural complications. The patient was transferred to the post catheterization recovery area for further monitoring.  Contrast: 75 cc   Estimated blood loss <50 mL.  During this procedure the patient was administered the following to achieve and maintain moderate conscious sedation: Versed 1 mg, Fentanyl 25 mcg, while the patient's heart rate, blood pressure, and oxygen saturation were continuously monitored. The period of conscious sedation was 40 minutes, of which I was present face-to-face 100% of this time.  Complications   Complications documented before study signed (04/04/2017 9:59 AM EDT)    No complications were associated with this study.  Documented by Martinique, Peter M, MD - 04/04/2017 9:50 AM EDT    Coronary Findings   Diagnostic  Dominance: Right  Left Anterior Descending  Ost LAD to Prox LAD lesion 100% stenosed  Ost LAD to Prox LAD lesion is 100% stenosed.  Prox LAD to Mid LAD lesion 100% stenosed  Prox LAD to Mid LAD lesion is 100% stenosed.  Left Circumflex  Ost Cx to Mid Cx lesion 100% stenosed  Ost Cx to Mid Cx lesion is 100% stenosed.  Right Coronary Artery  Prox RCA to Mid RCA lesion 100% stenosed  Prox RCA to Mid RCA lesion is 100% stenosed.  Acute Marginal Branch  Collaterals  Acute Mrg filled by collaterals from 2nd Mrg.    Right Posterior Atrioventricular Branch  Collaterals  Post Atrio filled by collaterals from Dist LAD.    LIMA LIMA Graft to Prox LAD  LIMA graft  was visualized by angiography and is normal in caliber. The graft exhibits no disease.  Sequential jump graft Graft to 1st Mrg, 2nd Mrg  Seq SVG- OM 1 and OM 2 graft was visualized by angiography and is large.  saphenous Graft to Mid LAD  SVG graft was visualized by angiography and is normal in caliber. The graft exhibits no disease.  Origin lesion 35% stenosed  Origin lesion is 35% stenosed. The lesion is eccentric.  Dist Graft lesion 30% stenosed  Dist Graft lesion is 30% stenosed.  Intervention   No interventions have been documented.  Right Heart   Right Heart Pressures Hemodynamic findings consistent with mild pulmonary hypertension.  Left Heart   Left Ventricle LV end diastolic pressure is mildly elevated.  Aortic Valve There is severe aortic valve stenosis. The aortic valve is calcified.  Coronary Diagrams   Diagnostic Diagram       Implants    No implant documentation for this case.  MERGE Images   Show images for CARDIAC CATHETERIZATION   Link to Procedure Log   Procedure Log    Hemo Data    Most Recent Value  Fick Cardiac Output 6.5 L/min  Fick Cardiac Output Index 3.17 (L/min)/BSA  Aortic Mean Gradient 31.3 mmHg  Aortic Peak Gradient 35 mmHg  Aortic Valve Area 1.14  Aortic Value Area Index 0.56 cm2/BSA  RA A Wave 10 mmHg  RA V Wave 10 mmHg  RA Mean 8 mmHg  RV Systolic Pressure 43 mmHg  RV Diastolic Pressure 2 mmHg  RV EDP 10 mmHg  PA Systolic Pressure 42 mmHg  PA Diastolic Pressure 16 mmHg  PA Mean 25 mmHg  PW A Wave 18 mmHg  PW V Wave 19 mmHg  PW Mean 14 mmHg  AO Systolic Pressure 509 mmHg  AO Diastolic  Pressure 58 mmHg  AO Mean 84 mmHg  LV Systolic Pressure 810 mmHg  LV Diastolic Pressure 7 mmHg  LV EDP 22 mmHg  Arterial Occlusion Pressure Extended Systolic Pressure 175 mmHg  Arterial Occlusion Pressure Extended Diastolic Pressure 58 mmHg  Arterial Occlusion Pressure Extended Mean Pressure 82 mmHg  Left Ventricular Apex Extended Systolic  Pressure 102 mmHg  Left Ventricular Apex Extended Diastolic Pressure 10 mmHg  Left Ventricular Apex Extended EDP Pressure 22 mmHg  QP/QS 1  TPVR Index 7.88 HRUI  TSVR Index 26.48 HRUI  PVR SVR Ratio 0.14  TPVR/TSVR Ratio 0.3    ADDENDUM REPORT: 04/18/2017 11:18  CLINICAL DATA:  Aortic stenosis  EXAM: Cardiac TAVR CT  TECHNIQUE: The patient was scanned on a [employer] 192 scanner. A 120 kV retrospective scan was triggered in the descending thoracic aorta at 111 HU's. Gantry rotation speed was 270 msecs and collimation was .9 mm. No beta blockade or nitro were given. The 3D data set was reconstructed in 5% intervals of the R-R cycle. Systolic and diastolic phases were analyzed on a dedicated work station using MPR, MIP and VRT modes. The patient received 80 cc of contrast.  FINDINGS: Aortic Valve: Calcified tri leaflet with restricted motion  Aorta: Moderate calcific aortic atherosclerosis with normal arch vessels and no aneurysm  Sinotubular Junction: 24 mm  Ascending Thoracic Aorta: 32 mm  Aortic Arch: 30 mm  Descending Thoracic Aorta: 28 mm  Sinus of Valsalva Measurements:  Non-coronary: 29 mm  Right - coronary: 28.3 mm  Left - coronary: 28.4 mm  Coronary Artery Height above Annulus:  Left Main: 10 mm above annulus  Right Coronary: 12 mm above annulus  Patent LIMA to LAD  Patent SVG to LAD  Patent SVG to OM  Virtual Basal Annulus Measurements:  Maximum/Minimum Diameter: 26.5 mm x 21.2 mm  Perimeter: 75.6 mm  Area: 416 mm2  Coronary Arteries: Sufficient height above annulus for deployment with patent left sided grafts  Optimum Fluoroscopic Angle for Delivery: LAO 9 degrees Cranial 7 degrees  IMPRESSION: 1. Calcified tri leaflet AV with annular area of 416 mm2 suitable for a 23 mm Sapien 3 valve  2. Coronary arteries sufficient height above annulus for deployment with patent left sided grafts  3.  No LAA  Thrombus  4. Optimum angiographic angle for deployment LAO 9 degrees Cranial 7 degrees  5. Normal aortic root 3.2 cm with moderate calcific aortic atherosclerosis  Jenkins Rouge   Electronically Signed   By: Jenkins Rouge M.D.   On: 04/18/2017 11:18  CLINICAL DATA:  77 year old female with history of severe aortic stenosis. Preprocedural study prior to potential transcatheter aortic valve replacement (TAVR) procedure.  EXAM: CT ANGIOGRAPHY CHEST, ABDOMEN AND PELVIS  TECHNIQUE: Multidetector CT imaging through the chest, abdomen and pelvis was performed using the standard protocol during bolus administration of intravenous contrast. Multiplanar reconstructed images and MIPs were obtained and reviewed to evaluate the vascular anatomy.  CONTRAST:  154mL ISOVUE-370 IOPAMIDOL (ISOVUE-370) INJECTION 76%  COMPARISON:  CT the abdomen and pelvis 04/15/2011. No prior chest CT.  FINDINGS: CTA CHEST FINDINGS  Cardiovascular: Heart size is enlarged with severe left atrial dilatation. There is no significant pericardial fluid, thickening or pericardial calcification. There is aortic atherosclerosis, as well as atherosclerosis of the great vessels of the mediastinum and the coronary arteries, including calcified atherosclerotic plaque in the left anterior descending, left circumflex and right coronary arteries. Status post median sternotomy for CABG. Severe thickening calcification of the aortic valve.  Mild calcification of the mitral annulus.  Mediastinum/Lymph Nodes: No pathologically enlarged mediastinal or hilar lymph nodes. Small hiatal hernia. Esophagus is otherwise unremarkable in appearance. No axillary lymphadenopathy.  Lungs/Pleura: No suspicious appearing pulmonary nodules or masses. No acute consolidative airspace disease. No pleural effusions.  Musculoskeletal/Soft Tissues: Median sternotomy wires. 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 There are no aggressive appearing lytic or blastic lesions noted in the visualized portions of the skeleton.  CTA ABDOMEN AND PELVIS FINDINGS  Hepatobiliary: No suspicious cystic or solid hepatic lesions. No intra or extrahepatic biliary ductal dilatation. Status post cholecystectomy.  Pancreas: No pancreatic mass. No pancreatic ductal dilatation. No pancreatic or peripancreatic fluid or inflammatory changes.  Spleen: Unremarkable.  Adrenals/Urinary Tract: 3.0 x 4.1 x 3.1 cm enhancing mass in the interpolar region of the right kidney (axial image 100 of series 15 and coronal image 67 of series 16). This lesion is new compared to prior study from 04/15/2011, does not extend into the right renal vein (which is widely patent), and is completely encapsulated within Gerota's fascia. Several subcentimeter low-attenuation lesions in both kidneys, too small to definitively characterize, but favored to represent tiny cysts. No hydroureteronephrosis. Urinary bladder is normal in appearance. Bilateral adrenal glands are normal in appearance.  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 No pathologic dilatation of small bowel or colon. Numerous colonic diverticulae are noted, particularly in the region of the sigmoid colon, without surrounding inflammatory changes to suggest an acute diverticulitis at this time. The appendix is not confidently identified and may be surgically absent. Regardless, there are no inflammatory changes noted adjacent to the cecum to suggest the presence of an acute appendicitis at this time.  Vascular/Lymphatic: Aortic atherosclerosis with fusiform aneurysmal dilatation of the infrarenal abdominal aorta which measures up to 3.4 x 3.0 cm (axial image 113 of series 15). 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 No lymphadenopathy noted in the abdomen or pelvis.  Reproductive: Uterus and ovaries are a trophic and otherwise unremarkable in appearance.  Other: No significant volume of ascites.  No pneumoperitoneum.  Musculoskeletal: Chronic appearing compression fracture of L1 with 20% loss of anterior vertebral body height. There are no aggressive appearing lytic or blastic lesions noted in the visualized portions of the skeleton.  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  LEFT PELVIS:  Left Common Iliac Artery -  Minimal Diameter-6.7 x 5.5 mm  Tortuosity-mild  Calcification-moderate  Left External Iliac Artery -  Minimal Diameter-6.2 x 6.1 mm  Tortuosity-mild  Calcification-mild-to-moderate  Left Common Femoral Artery -  Minimal Diameter-7.2 x 7.3 mm  Tortuosity-mild  Calcification-moderate  Review of the MIP images confirms the above findings.  IMPRESSION: 1. Vascular findings and measurements pertinent to potential TAVR procedure, as detailed above. 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 2. Severe thickening calcification of the aortic valve, compatible with the reported clinical history of severe aortic stenosis. 3. New large right renal mass measuring 3.0 x 4.1 x 3.1 cm highly  concerning for renal cell carcinoma. This is completely encapsulated within Gerota's fascia, does not involve the right renal vein, and is not associated with local lymphadenopathy or  definite findings to suggest metastatic disease in the chest, abdomen or pelvis. Urologic consultation is recommended in the near future for further evaluation. 4. Aortic atherosclerosis, in addition to left main and 3 vessel coronary artery disease. Status post median sternotomy for CABG. 5. In addition, there is an infrarenal abdominal aortic aneurysm measuring up to 3.4 x 3.0 cm (mean diameter of 3.2 cm). Recommend followup by ultrasound in 3 years. This recommendation follows ACR consensus guidelines: White Paper of the ACR Incidental Findings Committee II on Vascular Findings. J Am Coll Radiol 2013; 10:789-794. 6. Cardiomegaly with severe left atrial dilatation. 7. Mild colonic diverticulosis without evidence to suggest an acute diverticulitis at this time. 8. Additional incidental findings, as above.  Aortic Atherosclerosis (ICD10-I70.0). Aortic aneurysm NOS (ICD10-I71.9).   Electronically Signed   By: Vinnie Langton M.D.   On: 04/18/2017 11:51   Impression:  This 77 year old woman has stage D, severe, symptomatic aortic stenosis with New York Heart Association class III symptoms of exertional shortness of breath, fatigue, and orthopnea consistent with chronic diastolic heart failure.  I have personally reviewed her recent 2D echocardiogram, cardiac catheterization, and CTA studies.  Her echocardiogram shows a trileaflet aortic valve with severe thickening, calcification, and restricted leaflet mobility with a peak velocity across the aortic valve of 4.1 m/s consistent with severe aortic stenosis.  Cardiac catheterization confirmed severe aortic stenosis and shows severe native three-vessel coronary disease with continued patency of her bypass grafts.  I agree that aortic valve replacement is indicated in this patient with progressive symptoms of severe aortic stenosis.  She would be at high risk for open surgical aortic valve replacement given her 2 prior sternotomies for  coronary artery bypass graft surgery and I would not consider her a candidate for open surgery.  I think transcatheter aortic valve replacement would be the best alternative for her.  Her gated cardiac CTA shows anatomy consistent with severe aortic stenosis and amenable to transcatheter aortic valve replacement without any complicating features.  Her abdominal and pelvic CTA shows severe aortoiliac atherosclerosis with an occluded right common iliac artery.  There does appear to be adequate vasculature to allow left transfemoral insertion.  This would require a radial artery approach for insertion of a diagnostic pigtail catheter in the aortic root.  Her CTA of the abdomen also shows an incidental 3 x 4 cm mass in the right kidney consistent with a renal cell carcinoma.  There is no evidence of local invasion outside the kidney and no evidence of locally advanced or distant metastatic disease.  The she has an appointment to see Dr. Alinda Money of [facility] early next month for further evaluation of this mass.  The patient was counseled at length regarding treatment alternatives for management of severe symptomatic aortic stenosis. The risks and benefits of surgical intervention have been discussed in detail. Long-term prognosis with medical therapy was discussed. Alternative approaches such as conventional surgical aortic valve replacement, transcatheter aortic valve replacement, and palliative medical therapy were compared and contrasted at length. This discussion was placed in the context of the patient's own specific clinical presentation and past medical history. All of her questions been addressed.   Following the decision to proceed with transcatheter aortic valve replacement, a discussion was held regarding what types of management strategies would be attempted intraoperatively in the event of  life-threatening complications, including whether or not the patient would be considered a candidate for the  use of cardiopulmonary bypass and/or conversion to open sternotomy for attempted surgical intervention. The patient has been advised of a variety of complications that might develop including but not limited to risks of death, stroke, paravalvular leak, aortic dissection or other major vascular complications, aortic annulus rupture, device embolization, cardiac rupture or perforation, mitral regurgitation, acute myocardial infarction, arrhythmia, heart block or bradycardia requiring permanent pacemaker placement, congestive heart failure, respiratory failure, renal failure, pneumonia, infection, other late complications related to structural valve deterioration or migration, or other complications that might ultimately cause a temporary or permanent loss of functional independence or other long term morbidity. The patient provides full informed consent for the procedure as described and all questions were answered.     Plan:  Transcatheter aortic valve replacement via left transfemoral approach with radial artery insertion of a diagnostic pigtail catheter on 05/09/2017.   I spent 60 minutes performing this consultation and > 50% of this time was spent face to face counseling and coordinating the care of this patient's severe symptomatic aortic stenosis.    Gaye Pollack, MD 04/26/2017

## 2017-04-26 NOTE — Telephone Encounter (Signed)
Returned call to patient advised she does not need to repeat echo.

## 2017-04-26 NOTE — Telephone Encounter (Signed)
New Message:    Pt said Echo was cancelled,but she got a letter stating that she needs one. Is she supposed to have an Echo?

## 2017-04-27 ENCOUNTER — Encounter: Payer: Medicare Other | Admitting: Surgery

## 2017-05-04 NOTE — Pre-Procedure Instructions (Signed)
[name]  05/04/2017      [id number] [place], [place] - [address] [place] [address] Phone: [phone] Fax: [phone]    Your procedure is scheduled on April 30   Report to [facility] Admitting at 0800 A.M.  Call this number if you have problems the morning of surgery:  [phone]   Remember:  Do not eat food or drink liquids after midnight.  Continue all medications as directed by your physician except follow these medication instructions before surgery below   Take these medicines the morning of surgery with A SIP OF WATER  levothyroxine (SYNTHROID, LEVOTHROID per MD order  7 days prior to surgery STOP taking any Aspirin(unless otherwise instructed by your surgeon), Aleve, Naproxen, Ibuprofen, Motrin, Advil, Goody's, BC's, all herbal medications, fish oil, and all vitamins  Follow your doctors instructions regarding your Aspirin.  If no instructions were given by your doctor, then you will need to call the prescribing office office to get instructions.      Do not wear jewelry, make-up or nail polish.  Do not wear lotions, powders, or perfumes, or deodorant.  Do not shave 48 hours prior to surgery.    Do not bring valuables to the hospital.  [facility] is not responsible for any belongings or valuables.  Contacts, dentures or bridgework may not be worn into surgery.  Leave your suitcase in the car.  After surgery it may be brought to your room.  For patients admitted to the hospital, discharge time will be determined by your treatment team.  Patients discharged the day of surgery will not be allowed to drive home.    Special instructions:   [place]- Preparing For Surgery  Before surgery, you can play an important role. Because skin is not sterile, your skin needs to be as free of germs as possible. You can reduce the number of germs on your skin by washing with CHG  (chlorahexidine gluconate) Soap before surgery.  CHG is an antiseptic cleaner which kills germs and bonds with the skin to continue killing germs even after washing.  Please do not use if you have an allergy to CHG or antibacterial soaps. If your skin becomes reddened/irritated stop using the CHG.  Do not shave (including legs and underarms) for at least 48 hours prior to first CHG shower. It is OK to shave your face.  Please follow these instructions carefully.   1. Shower the NIGHT BEFORE SURGERY and the MORNING OF SURGERY with CHG.   2. If you chose to wash your hair, wash your hair first as usual with your normal shampoo.  3. After you shampoo, rinse your hair and body thoroughly to remove the shampoo.  4. Use CHG as you would any other liquid soap. You can apply CHG directly to the skin and wash gently with a scrungie or a clean washcloth.   5. Apply the CHG Soap to your body ONLY FROM THE NECK DOWN.  Do not use on open wounds or open sores. Avoid contact with your eyes, ears, mouth and genitals (private parts). Wash Face and genitals (private parts)  with your normal soap.  6. Wash thoroughly, paying special attention to the area where your surgery will be performed.  7. Thoroughly rinse your body with warm water from the neck down.  8. DO NOT shower/wash with your normal soap after using and rinsing off the CHG  Soap.  9. Pat yourself dry with a CLEAN TOWEL.  10. Wear CLEAN PAJAMAS to bed the night before surgery, wear comfortable clothes the morning of surgery  11. Place CLEAN SHEETS on your bed the night of your first shower and DO NOT SLEEP WITH PETS.    Day of Surgery: Do not apply any deodorants/lotions. Please wear clean clothes to the hospital/surgery center.      Please read over the following fact sheets that you were given.

## 2017-05-05 ENCOUNTER — Other Ambulatory Visit: Payer: Self-pay

## 2017-05-05 ENCOUNTER — Telehealth: Payer: Self-pay

## 2017-05-05 ENCOUNTER — Encounter (HOSPITAL_COMMUNITY): Payer: Self-pay

## 2017-05-05 ENCOUNTER — Encounter (HOSPITAL_COMMUNITY)
Admission: RE | Admit: 2017-05-05 | Discharge: 2017-05-05 | Disposition: A | Discharge status: Home / Self Care | Payer: Medicare Other | Source: Ambulatory Visit | Attending: Cardiovascular Disease | Admitting: Cardiovascular Disease

## 2017-05-05 ENCOUNTER — Ambulatory Visit (HOSPITAL_COMMUNITY)
Admission: RE | Admit: 2017-05-05 | Discharge: 2017-05-05 | Disposition: A | Discharge status: Home / Self Care | Payer: Medicare Other | Source: Ambulatory Visit | Attending: Cardiovascular Disease | Admitting: Cardiovascular Disease

## 2017-05-05 DIAGNOSIS — Z01818 Encounter for other preprocedural examination: Secondary | ICD-10-CM | POA: Insufficient documentation

## 2017-05-05 DIAGNOSIS — R918 Other nonspecific abnormal finding of lung field: Secondary | ICD-10-CM | POA: Diagnosis not present

## 2017-05-05 DIAGNOSIS — I10 Essential (primary) hypertension: Secondary | ICD-10-CM | POA: Diagnosis not present

## 2017-05-05 DIAGNOSIS — E039 Hypothyroidism, unspecified: Secondary | ICD-10-CM | POA: Insufficient documentation

## 2017-05-05 DIAGNOSIS — F17210 Nicotine dependence, cigarettes, uncomplicated: Secondary | ICD-10-CM | POA: Insufficient documentation

## 2017-05-05 DIAGNOSIS — I739 Peripheral vascular disease, unspecified: Secondary | ICD-10-CM | POA: Insufficient documentation

## 2017-05-05 DIAGNOSIS — Z01812 Encounter for preprocedural laboratory examination: Secondary | ICD-10-CM | POA: Insufficient documentation

## 2017-05-05 DIAGNOSIS — I2510 Atherosclerotic heart disease of native coronary artery without angina pectoris: Secondary | ICD-10-CM | POA: Diagnosis not present

## 2017-05-05 DIAGNOSIS — I700 Atherosclerosis of aorta: Secondary | ICD-10-CM | POA: Insufficient documentation

## 2017-05-05 DIAGNOSIS — Z01810 Encounter for preprocedural cardiovascular examination: Secondary | ICD-10-CM | POA: Diagnosis not present

## 2017-05-05 DIAGNOSIS — I350 Nonrheumatic aortic (valve) stenosis: Secondary | ICD-10-CM | POA: Insufficient documentation

## 2017-05-05 DIAGNOSIS — K449 Diaphragmatic hernia without obstruction or gangrene: Secondary | ICD-10-CM | POA: Insufficient documentation

## 2017-05-05 DIAGNOSIS — R9431 Abnormal electrocardiogram [ECG] [EKG]: Secondary | ICD-10-CM | POA: Diagnosis not present

## 2017-05-05 DIAGNOSIS — K219 Gastro-esophageal reflux disease without esophagitis: Secondary | ICD-10-CM | POA: Diagnosis not present

## 2017-05-05 HISTORY — DX: Other specified disorders of kidney and ureter: N28.89

## 2017-05-05 LAB — SURGICAL PCR SCREEN
MRSA, PCR: NEGATIVE
Staphylococcus aureus: NEGATIVE

## 2017-05-05 LAB — URINALYSIS, ROUTINE W REFLEX MICROSCOPIC
Bilirubin Urine: NEGATIVE
Glucose, UA: NEGATIVE mg/dL
KETONES UR: NEGATIVE mg/dL
Nitrite: NEGATIVE
PROTEIN: NEGATIVE mg/dL
Specific Gravity, Urine: 1.01 (ref 1.005–1.030)
pH: 7 (ref 5.0–8.0)

## 2017-05-05 LAB — BRAIN NATRIURETIC PEPTIDE: B NATRIURETIC PEPTIDE 5: 492.6 pg/mL — AB (ref 0.0–100.0)

## 2017-05-05 LAB — CBC
HEMATOCRIT: 38.6 % (ref 36.0–46.0)
Hemoglobin: 12.5 g/dL (ref 12.0–15.0)
MCH: 31.7 pg (ref 26.0–34.0)
MCHC: 32.4 g/dL (ref 30.0–36.0)
MCV: 98 fL (ref 78.0–100.0)
Platelets: 161 10*3/uL (ref 150–400)
RBC: 3.94 MIL/uL (ref 3.87–5.11)
RDW: 14.3 % (ref 11.5–15.5)
WBC: 6.9 10*3/uL (ref 4.0–10.5)

## 2017-05-05 LAB — COMPREHENSIVE METABOLIC PANEL
ALBUMIN: 3.4 g/dL — AB (ref 3.5–5.0)
ALT: 24 U/L (ref 14–54)
ANION GAP: 14 (ref 5–15)
AST: 25 U/L (ref 15–41)
Alkaline Phosphatase: 78 U/L (ref 38–126)
BILIRUBIN TOTAL: 1.3 mg/dL — AB (ref 0.3–1.2)
BUN: 15 mg/dL (ref 6–20)
CO2: 19 mmol/L — ABNORMAL LOW (ref 22–32)
Calcium: 9.3 mg/dL (ref 8.9–10.3)
Chloride: 103 mmol/L (ref 101–111)
Creatinine, Ser: 1.02 mg/dL — ABNORMAL HIGH (ref 0.44–1.00)
GFR calc Af Amer: >60 mL/min (ref >60)
GFR, EST NON AFRICAN AMERICAN: 53 mL/min — AB (ref >60)
GLUCOSE: 110 mg/dL — AB (ref 65–99)
POTASSIUM: 4.3 mmol/L (ref 3.5–5.1)
Sodium: 136 mmol/L (ref 135–145)
TOTAL PROTEIN: 6.3 g/dL — AB (ref 6.5–8.1)

## 2017-05-05 LAB — BLOOD GAS, ARTERIAL
ACID-BASE EXCESS: 0.4 mmol/L (ref 0.0–2.0)
Bicarbonate: 24 mmol/L (ref 20.0–28.0)
DRAWN BY: 449841
FIO2: 21
O2 SAT: 94.6 %
PATIENT TEMPERATURE: 98.6
PCO2 ART: 35 mmHg (ref 32.0–48.0)
pH, Arterial: 7.45 (ref 7.350–7.450)
pO2, Arterial: 82.5 mmHg — ABNORMAL LOW (ref 83.0–108.0)

## 2017-05-05 LAB — PROTIME-INR
INR: 0.98
PROTHROMBIN TIME: 12.9 s (ref 11.4–15.2)

## 2017-05-05 LAB — ABO/RH: ABO/RH(D): A POS

## 2017-05-05 LAB — TYPE AND SCREEN
ABO/RH(D): A POS
Antibody Screen: NEGATIVE

## 2017-05-05 LAB — APTT: APTT: 27 s (ref 24–36)

## 2017-05-05 LAB — HEMOGLOBIN A1C
HEMOGLOBIN A1C: 5.8 % — AB (ref 4.8–5.6)
Mean Plasma Glucose: 119.76 mg/dL

## 2017-05-05 NOTE — Progress Notes (Signed)
Notified Lauren for abnormal labs, left message

## 2017-05-05 NOTE — Telephone Encounter (Signed)
Reviewed thanks! 

## 2017-05-05 NOTE — Telephone Encounter (Signed)
[name] from Short Stay left message on TAVR VM that labs were abnormal at preadmission testing.   To Dr. Burt Knack for review (TAVR scheduled 4/30).

## 2017-05-05 NOTE — Progress Notes (Signed)
PCP - Anastasia Pall Cardiologist - Peter Martinique  Chest x-ray - 05/05/17 EKG - 05/05/17 Stress Test - 2016 ECHO - 2019 Cardiac [id number] -    Aspirin Instructions: continue per dr York Cerise orders  Anesthesia review: yes cardiac history  Patient denies shortness of breath, fever, cough and chest pain at PAT appointment   Patient verbalized understanding of instructions that were given to them at the PAT appointment. Patient was also instructed that they will need to review over the PAT instructions again at home before surgery.

## 2017-05-08 MED ORDER — DEXMEDETOMIDINE HCL IN NACL 400 MCG/100ML IV SOLN
0.1000 ug/kg/h | INTRAVENOUS | Status: AC
  Administered 2017-05-09: 2.4 ug/kg/h via INTRAVENOUS
  Administered 2017-05-09: 1 ug/kg/h via INTRAVENOUS
  Filled 2017-05-08: qty 100

## 2017-05-08 MED ORDER — NOREPINEPHRINE BITARTRATE 1 MG/ML IV SOLN
0.0000 ug/min | INTRAVENOUS | Status: AC
  Administered 2017-05-09: 1 ug/min via INTRAVENOUS
  Filled 2017-05-08: qty 4

## 2017-05-08 MED ORDER — POTASSIUM CHLORIDE 2 MEQ/ML IV SOLN
80.0000 meq | INTRAVENOUS | Status: DC
Start: 2017-05-09 — End: 2017-05-09
  Filled 2017-05-08: qty 40

## 2017-05-08 MED ORDER — SODIUM CHLORIDE 0.9 % IV SOLN
INTRAVENOUS | Status: DC
  Filled 2017-05-08: qty 1

## 2017-05-08 MED ORDER — EPINEPHRINE PF 1 MG/ML IJ SOLN
0.0000 ug/min | INTRAVENOUS | Status: DC
  Filled 2017-05-08: qty 4

## 2017-05-08 MED ORDER — DOPAMINE-DEXTROSE 3.2-5 MG/ML-% IV SOLN
0.0000 ug/kg/min | INTRAVENOUS | Status: DC
  Filled 2017-05-08: qty 250

## 2017-05-08 MED ORDER — SODIUM CHLORIDE 0.9 % IV SOLN
1.5000 g | INTRAVENOUS | Status: AC
  Administered 2017-05-09: 1.5 g via INTRAVENOUS
  Filled 2017-05-08: qty 1.5

## 2017-05-08 MED ORDER — NITROGLYCERIN IN D5W 200-5 MCG/ML-% IV SOLN
2.0000 ug/min | INTRAVENOUS | Status: DC
  Filled 2017-05-08: qty 250

## 2017-05-08 MED ORDER — SODIUM CHLORIDE 0.9 % IV SOLN: INTRAVENOUS | Status: DC

## 2017-05-08 MED ORDER — VANCOMYCIN HCL 10 G IV SOLR
1500.0000 mg | INTRAVENOUS | Status: AC
  Administered 2017-05-09: 1500 mg via INTRAVENOUS
  Filled 2017-05-08: qty 1500

## 2017-05-08 MED ORDER — SODIUM CHLORIDE 0.9 % IV SOLN
INTRAVENOUS | Status: DC
  Filled 2017-05-08: qty 30

## 2017-05-08 MED ORDER — MAGNESIUM SULFATE 50 % IJ SOLN
40.0000 meq | INTRAMUSCULAR | Status: DC
  Filled 2017-05-08: qty 9.85

## 2017-05-08 MED ORDER — PHENYLEPHRINE HCL 10 MG/ML IJ SOLN
30.0000 ug/min | INTRAMUSCULAR | Status: DC
  Filled 2017-05-08: qty 2

## 2017-05-08 NOTE — Anesthesia Preprocedure Evaluation (Addendum)
Anesthesia Evaluation  Patient identified by MRN, date of birth, ID band Patient awake    Reviewed: Allergy & Precautions, NPO status , Patient's Chart, lab work & pertinent test results  History of Anesthesia Complications (+) PONV  Airway Mallampati: II  TM Distance: >3 FB     Dental  (+) Teeth Intact, Dental Advisory Given   Pulmonary Current Smoker,    breath sounds clear to auscultation       Cardiovascular hypertension, + CAD and + Peripheral Vascular Disease   Rhythm:Regular Rate:Normal + Systolic murmurs    Neuro/Psych    GI/Hepatic Neg liver ROS, hiatal hernia, GERD  ,  Endo/Other  Hypothyroidism   Renal/GU negative Renal ROS     Musculoskeletal   Abdominal   Peds  Hematology   Anesthesia Other Findings   Reproductive/Obstetrics                          Anesthesia Physical Anesthesia Plan  ASA: IV  Anesthesia Plan:    Post-op Pain Management:    Induction: Intravenous  PONV Risk Score and Plan: 1 and Treatment may vary due to age or medical condition  Airway Management Planned:   Additional Equipment: CVP  Intra-op Plan:   Post-operative Plan:   Informed Consent:   Dental advisory given  Plan Discussed with: CRNA and Anesthesiologist  Anesthesia Plan Comments: (Per Lauren, TAVR nurse, NO radial art lines on this patient.  Willeen Cass, FNP)       Anesthesia Quick Evaluation

## 2017-05-09 ENCOUNTER — Inpatient Hospital Stay (HOSPITAL_COMMUNITY): Payer: Medicare Other

## 2017-05-09 ENCOUNTER — Inpatient Hospital Stay (HOSPITAL_COMMUNITY): Payer: Medicare Other | Admitting: Certified Registered"

## 2017-05-09 ENCOUNTER — Inpatient Hospital Stay (HOSPITAL_COMMUNITY)
Admission: RE | Admit: 2017-05-09 | Discharge: 2017-05-11 | DRG: 266 | Disposition: A | Discharge status: Home / Self Care | Payer: Medicare Other | Source: Ambulatory Visit | Attending: Cardiovascular Disease | Admitting: Cardiovascular Disease

## 2017-05-09 ENCOUNTER — Inpatient Hospital Stay (HOSPITAL_COMMUNITY): Payer: Medicare Other | Admitting: Emergency Medicine

## 2017-05-09 ENCOUNTER — Encounter (HOSPITAL_COMMUNITY): Payer: Self-pay

## 2017-05-09 ENCOUNTER — Inpatient Hospital Stay (HOSPITAL_COMMUNITY)
Admission: RE | Disposition: A | Discharge status: Home / Self Care | Payer: Medicare Other | Source: Ambulatory Visit | Attending: Cardiovascular Disease

## 2017-05-09 DIAGNOSIS — Z79890 Hormone replacement therapy (postmenopausal): Secondary | ICD-10-CM

## 2017-05-09 DIAGNOSIS — E785 Hyperlipidemia, unspecified: Secondary | ICD-10-CM | POA: Diagnosis present

## 2017-05-09 DIAGNOSIS — Z6833 Body mass index (BMI) 33.0-33.9, adult: Secondary | ICD-10-CM

## 2017-05-09 DIAGNOSIS — I2510 Atherosclerotic heart disease of native coronary artery without angina pectoris: Secondary | ICD-10-CM | POA: Diagnosis present

## 2017-05-09 DIAGNOSIS — Z006 Encounter for examination for normal comparison and control in clinical research program: Secondary | ICD-10-CM

## 2017-05-09 DIAGNOSIS — I350 Nonrheumatic aortic (valve) stenosis: Secondary | ICD-10-CM

## 2017-05-09 DIAGNOSIS — F17210 Nicotine dependence, cigarettes, uncomplicated: Secondary | ICD-10-CM | POA: Diagnosis present

## 2017-05-09 DIAGNOSIS — Z862 Personal history of diseases of the blood and blood-forming organs and certain disorders involving the immune mechanism: Secondary | ICD-10-CM

## 2017-05-09 DIAGNOSIS — E039 Hypothyroidism, unspecified: Secondary | ICD-10-CM | POA: Diagnosis present

## 2017-05-09 DIAGNOSIS — Z9049 Acquired absence of other specified parts of digestive tract: Secondary | ICD-10-CM | POA: Diagnosis not present

## 2017-05-09 DIAGNOSIS — Z9884 Bariatric surgery status: Secondary | ICD-10-CM

## 2017-05-09 DIAGNOSIS — I5033 Acute on chronic diastolic (congestive) heart failure: Secondary | ICD-10-CM | POA: Diagnosis not present

## 2017-05-09 DIAGNOSIS — I739 Peripheral vascular disease, unspecified: Secondary | ICD-10-CM | POA: Diagnosis present

## 2017-05-09 DIAGNOSIS — Z79899 Other long term (current) drug therapy: Secondary | ICD-10-CM | POA: Diagnosis not present

## 2017-05-09 DIAGNOSIS — R0602 Shortness of breath: Secondary | ICD-10-CM | POA: Diagnosis present

## 2017-05-09 DIAGNOSIS — C641 Malignant neoplasm of right kidney, except renal pelvis: Secondary | ICD-10-CM | POA: Diagnosis present

## 2017-05-09 DIAGNOSIS — I272 Pulmonary hypertension, unspecified: Secondary | ICD-10-CM | POA: Diagnosis present

## 2017-05-09 DIAGNOSIS — Z951 Presence of aortocoronary bypass graft: Secondary | ICD-10-CM | POA: Diagnosis not present

## 2017-05-09 DIAGNOSIS — K219 Gastro-esophageal reflux disease without esophagitis: Secondary | ICD-10-CM | POA: Diagnosis present

## 2017-05-09 DIAGNOSIS — I110 Hypertensive heart disease with heart failure: Secondary | ICD-10-CM | POA: Diagnosis present

## 2017-05-09 DIAGNOSIS — Z7982 Long term (current) use of aspirin: Secondary | ICD-10-CM | POA: Diagnosis not present

## 2017-05-09 DIAGNOSIS — I361 Nonrheumatic tricuspid (valve) insufficiency: Secondary | ICD-10-CM | POA: Diagnosis not present

## 2017-05-09 DIAGNOSIS — I10 Essential (primary) hypertension: Secondary | ICD-10-CM | POA: Diagnosis present

## 2017-05-09 DIAGNOSIS — Z952 Presence of prosthetic heart valve: Secondary | ICD-10-CM

## 2017-05-09 HISTORY — DX: Presence of prosthetic heart valve: Z95.2

## 2017-05-09 HISTORY — PX: TRANSCATHETER AORTIC VALVE REPLACEMENT, TRANSFEMORAL: SHX6400

## 2017-05-09 HISTORY — PX: TEE WITHOUT CARDIOVERSION: SHX5443

## 2017-05-09 HISTORY — PX: ARTERIAL LINE INSERTION: CATH118227

## 2017-05-09 LAB — POCT I-STAT, CHEM 8
BUN: 16 mg/dL (ref 6–20)
BUN: 17 mg/dL (ref 6–20)
BUN: 16 mg/dL (ref 6–20)
BUN: 12 mg/dL (ref 6–20)
CALCIUM ION: 1.18 mmol/L (ref 1.15–1.40)
CALCIUM ION: 1.26 mmol/L (ref 1.15–1.40)
CALCIUM ION: 1.23 mmol/L (ref 1.15–1.40)
CHLORIDE: 104 mmol/L (ref 101–111)
CHLORIDE: 105 mmol/L (ref 101–111)
CREATININE: 0.8 mg/dL (ref 0.44–1.00)
Calcium, Ion: 1.18 mmol/L (ref 1.15–1.40)
Chloride: 104 mmol/L (ref 101–111)
Chloride: 102 mmol/L (ref 101–111)
Creatinine, Ser: 0.9 mg/dL (ref 0.44–1.00)
Creatinine, Ser: 0.7 mg/dL (ref 0.44–1.00)
Creatinine, Ser: 0.6 mg/dL (ref 0.44–1.00)
GLUCOSE: 127 mg/dL — AB (ref 65–99)
Glucose, Bld: 123 mg/dL — ABNORMAL HIGH (ref 65–99)
Glucose, Bld: 111 mg/dL — ABNORMAL HIGH (ref 65–99)
Glucose, Bld: 108 mg/dL — ABNORMAL HIGH (ref 65–99)
HCT: 30 % — ABNORMAL LOW (ref 36.0–46.0)
HCT: 33 % — ABNORMAL LOW (ref 36.0–46.0)
HCT: 30 % — ABNORMAL LOW (ref 36.0–46.0)
HEMATOCRIT: 37 % (ref 36.0–46.0)
HEMOGLOBIN: 10.2 g/dL — AB (ref 12.0–15.0)
Hemoglobin: 11.2 g/dL — ABNORMAL LOW (ref 12.0–15.0)
Hemoglobin: 10.2 g/dL — ABNORMAL LOW (ref 12.0–15.0)
Hemoglobin: 12.6 g/dL (ref 12.0–15.0)
Potassium: 4.2 mmol/L (ref 3.5–5.1)
Potassium: 4.3 mmol/L (ref 3.5–5.1)
Potassium: 4 mmol/L (ref 3.5–5.1)
Potassium: 4.4 mmol/L (ref 3.5–5.1)
SODIUM: 140 mmol/L (ref 135–145)
SODIUM: 140 mmol/L (ref 135–145)
Sodium: 139 mmol/L (ref 135–145)
Sodium: 139 mmol/L (ref 135–145)
TCO2: 25 mmol/L (ref 22–32)
TCO2: 26 mmol/L (ref 22–32)
TCO2: 24 mmol/L (ref 22–32)
TCO2: 25 mmol/L (ref 22–32)

## 2017-05-09 LAB — POCT I-STAT 3, ART BLOOD GAS (G3+)
Acid-base deficit: 2 mmol/L (ref 0.0–2.0)
Bicarbonate: 23.7 mmol/L (ref 20.0–28.0)
O2 SAT: 96 %
PCO2 ART: 42.4 mmHg (ref 32.0–48.0)
Patient temperature: 97.6
TCO2: 25 mmol/L (ref 22–32)
pH, Arterial: 7.353 (ref 7.350–7.450)
pO2, Arterial: 86 mmHg (ref 83.0–108.0)

## 2017-05-09 SURGERY — IMPLANTATION, AORTIC VALVE, TRANSCATHETER, FEMORAL APPROACH
Anesthesia: General | Site: Wrist | Laterality: Right

## 2017-05-09 MED ORDER — SODIUM CHLORIDE 0.9% FLUSH
3.0000 mL | Freq: Two times a day (BID) | INTRAVENOUS | Status: DC
  Administered 2017-05-09 – 2017-05-10 (×2): 3 mL via INTRAVENOUS

## 2017-05-09 MED ORDER — HYDROMORPHONE HCL 2 MG/ML IJ SOLN: 0.2500 mg | INTRAMUSCULAR | Status: DC | PRN

## 2017-05-09 MED ORDER — SIMVASTATIN 40 MG PO TABS
40.0000 mg | ORAL_TABLET | Freq: Every day | ORAL | Status: DC
  Administered 2017-05-09 – 2017-05-10 (×2): 40 mg via ORAL
  Filled 2017-05-09 (×2): qty 1

## 2017-05-09 MED ORDER — IODIXANOL 320 MG/ML IV SOLN
INTRAVENOUS | Status: DC | PRN
  Administered 2017-05-09: 40.6 mL via INTRA_ARTERIAL

## 2017-05-09 MED ORDER — CHLORHEXIDINE GLUCONATE 4 % EX LIQD: 60.0000 mL | Freq: Once | CUTANEOUS | Status: DC

## 2017-05-09 MED ORDER — ALBUTEROL SULFATE (2.5 MG/3ML) 0.083% IN NEBU: 3.0000 mL | INHALATION_SOLUTION | RESPIRATORY_TRACT | Status: DC | PRN

## 2017-05-09 MED ORDER — LIDOCAINE HCL (PF) 1 % IJ SOLN
INTRAMUSCULAR | Status: AC
  Filled 2017-05-09: qty 30

## 2017-05-09 MED ORDER — PANTOPRAZOLE SODIUM 40 MG PO TBEC
40.0000 mg | DELAYED_RELEASE_TABLET | Freq: Every day | ORAL | Status: DC
  Administered 2017-05-09 – 2017-05-11 (×3): 40 mg via ORAL
  Filled 2017-05-09 (×3): qty 1

## 2017-05-09 MED ORDER — MIDAZOLAM HCL 2 MG/2ML IJ SOLN
INTRAMUSCULAR | Status: AC
  Filled 2017-05-09: qty 2

## 2017-05-09 MED ORDER — ASPIRIN EC 81 MG PO TBEC
81.0000 mg | DELAYED_RELEASE_TABLET | Freq: Every day | ORAL | Status: DC
  Administered 2017-05-09 – 2017-05-10 (×2): 81 mg via ORAL
  Filled 2017-05-09 (×2): qty 1

## 2017-05-09 MED ORDER — FENTANYL CITRATE (PF) 100 MCG/2ML IJ SOLN
50.0000 ug | Freq: Once | INTRAMUSCULAR | Status: AC
  Administered 2017-05-09: 50 ug via INTRAVENOUS

## 2017-05-09 MED ORDER — CHLORHEXIDINE GLUCONATE 4 % EX LIQD: 30.0000 mL | CUTANEOUS | Status: DC

## 2017-05-09 MED ORDER — MIDAZOLAM HCL 2 MG/2ML IJ SOLN
INTRAMUSCULAR | Status: AC
  Administered 2017-05-09: 1.5 mg via INTRAVENOUS
  Filled 2017-05-09: qty 2

## 2017-05-09 MED ORDER — FENTANYL CITRATE (PF) 100 MCG/2ML IJ SOLN
INTRAMUSCULAR | Status: DC | PRN
  Administered 2017-05-09 (×2): 25 ug via INTRAVENOUS

## 2017-05-09 MED ORDER — ORAL CARE MOUTH RINSE
15.0000 mL | Freq: Two times a day (BID) | OROMUCOSAL | Status: DC
  Administered 2017-05-09 – 2017-05-10 (×3): 15 mL via OROMUCOSAL

## 2017-05-09 MED ORDER — 0.9 % SODIUM CHLORIDE (POUR BTL) OPTIME
TOPICAL | Status: DC | PRN
  Administered 2017-05-09: 1000 mL

## 2017-05-09 MED ORDER — FENTANYL CITRATE (PF) 250 MCG/5ML IJ SOLN
INTRAMUSCULAR | Status: AC
  Filled 2017-05-09: qty 5

## 2017-05-09 MED ORDER — LOPERAMIDE HCL 2 MG PO CAPS: 2.0000 mg | ORAL_CAPSULE | Freq: Four times a day (QID) | ORAL | Status: DC | PRN

## 2017-05-09 MED ORDER — SODIUM CHLORIDE 0.9% FLUSH: 3.0000 mL | INTRAVENOUS | Status: DC | PRN

## 2017-05-09 MED ORDER — PROTAMINE SULFATE 10 MG/ML IV SOLN
INTRAVENOUS | Status: DC | PRN
  Administered 2017-05-09: 30 mg via INTRAVENOUS
  Administered 2017-05-09 (×3): 20 mg via INTRAVENOUS
  Administered 2017-05-09: 30 mg via INTRAVENOUS
  Administered 2017-05-09: 20 mg via INTRAVENOUS

## 2017-05-09 MED ORDER — SODIUM CHLORIDE 0.9 % IV SOLN
INTRAVENOUS | Status: DC | PRN
  Administered 2017-05-09: 1500 mL

## 2017-05-09 MED ORDER — HEPARIN SODIUM (PORCINE) 1000 UNIT/ML IJ SOLN
INTRAMUSCULAR | Status: DC | PRN
  Administered 2017-05-09: 14000 [IU] via INTRAVENOUS

## 2017-05-09 MED ORDER — OXYCODONE HCL 5 MG PO TABS
5.0000 mg | ORAL_TABLET | ORAL | Status: DC | PRN
  Administered 2017-05-10: 5 mg via ORAL
  Administered 2017-05-10 (×2): 10 mg via ORAL
  Filled 2017-05-09: qty 1
  Filled 2017-05-09 (×2): qty 2
  Filled 2017-05-09: qty 1

## 2017-05-09 MED ORDER — AMLODIPINE BESYLATE 10 MG PO TABS
10.0000 mg | ORAL_TABLET | Freq: Every day | ORAL | Status: DC
  Administered 2017-05-10 – 2017-05-11 (×2): 10 mg via ORAL
  Filled 2017-05-09 (×2): qty 1

## 2017-05-09 MED ORDER — MIDAZOLAM HCL 2 MG/2ML IJ SOLN: 2.0000 mg | INTRAMUSCULAR | Status: DC | PRN

## 2017-05-09 MED ORDER — ALBUMIN HUMAN 5 % IV SOLN: 250.0000 mL | INTRAVENOUS | Status: DC | PRN

## 2017-05-09 MED ORDER — MIDAZOLAM HCL 2 MG/2ML IJ SOLN
1.5000 mg | Freq: Once | INTRAMUSCULAR | Status: AC
  Administered 2017-05-09: 1.5 mg via INTRAVENOUS

## 2017-05-09 MED ORDER — PROPOFOL 10 MG/ML IV BOLUS
INTRAVENOUS | Status: AC
  Filled 2017-05-09: qty 20

## 2017-05-09 MED ORDER — LACTATED RINGERS IV SOLN
INTRAVENOUS | Status: DC
  Administered 2017-05-09: 09:00:00 via INTRAVENOUS

## 2017-05-09 MED ORDER — NITROGLYCERIN IN D5W 200-5 MCG/ML-% IV SOLN: 0.0000 ug/min | INTRAVENOUS | Status: DC

## 2017-05-09 MED ORDER — ONDANSETRON HCL 4 MG/2ML IJ SOLN
INTRAMUSCULAR | Status: AC
  Filled 2017-05-09: qty 2

## 2017-05-09 MED ORDER — TRAMADOL HCL 50 MG PO TABS
50.0000 mg | ORAL_TABLET | ORAL | Status: DC | PRN
Start: 2017-05-09 — End: 2017-05-11
  Administered 2017-05-09: 100 mg via ORAL
  Filled 2017-05-09 (×2): qty 2

## 2017-05-09 MED ORDER — SIMETHICONE 80 MG PO CHEW: 80.0000 mg | CHEWABLE_TABLET | Freq: Four times a day (QID) | ORAL | Status: DC | PRN

## 2017-05-09 MED ORDER — CHLORHEXIDINE GLUCONATE 0.12 % MT SOLN
OROMUCOSAL | Status: AC
  Administered 2017-05-09: 15 mL via OROMUCOSAL
  Filled 2017-05-09: qty 15

## 2017-05-09 MED ORDER — FENTANYL CITRATE (PF) 100 MCG/2ML IJ SOLN
INTRAMUSCULAR | Status: AC
  Administered 2017-05-09: 50 ug via INTRAVENOUS
  Filled 2017-05-09: qty 2

## 2017-05-09 MED ORDER — ACETAMINOPHEN 500 MG PO TABS
1000.0000 mg | ORAL_TABLET | Freq: Four times a day (QID) | ORAL | Status: DC | PRN
  Administered 2017-05-11: 1000 mg via ORAL
  Filled 2017-05-09: qty 2

## 2017-05-09 MED ORDER — LACTATED RINGERS IV SOLN
INTRAVENOUS | Status: DC | PRN
  Administered 2017-05-09: 10:00:00 via INTRAVENOUS

## 2017-05-09 MED ORDER — PROPOFOL 500 MG/50ML IV EMUL
INTRAVENOUS | Status: AC
  Filled 2017-05-09: qty 50

## 2017-05-09 MED ORDER — CHLORHEXIDINE GLUCONATE 0.12 % MT SOLN
15.0000 mL | Freq: Once | OROMUCOSAL | Status: AC
  Administered 2017-05-09: 15 mL via OROMUCOSAL

## 2017-05-09 MED ORDER — MORPHINE SULFATE (PF) 2 MG/ML IV SOLN: 2.0000 mg | INTRAVENOUS | Status: DC | PRN

## 2017-05-09 MED ORDER — ALPRAZOLAM 0.25 MG PO TABS
0.2500 mg | ORAL_TABLET | Freq: Three times a day (TID) | ORAL | Status: DC | PRN
  Administered 2017-05-09 – 2017-05-10 (×2): 0.25 mg via ORAL
  Filled 2017-05-09 (×2): qty 1

## 2017-05-09 MED ORDER — ONDANSETRON HCL 4 MG/2ML IJ SOLN
INTRAMUSCULAR | Status: DC | PRN
  Administered 2017-05-09: 4 mg via INTRAVENOUS

## 2017-05-09 MED ORDER — FLUOXETINE HCL 20 MG PO CAPS
40.0000 mg | ORAL_CAPSULE | Freq: Every day | ORAL | Status: DC
  Administered 2017-05-09 – 2017-05-10 (×2): 40 mg via ORAL
  Filled 2017-05-09 (×4): qty 2

## 2017-05-09 MED ORDER — SODIUM CHLORIDE 0.9 % IV SOLN
INTRAVENOUS | Status: AC
  Filled 2017-05-09 (×3): qty 1.2

## 2017-05-09 MED ORDER — ONDANSETRON HCL 4 MG/2ML IJ SOLN
4.0000 mg | Freq: Four times a day (QID) | INTRAMUSCULAR | Status: DC | PRN
  Administered 2017-05-10: 4 mg via INTRAVENOUS
  Filled 2017-05-09: qty 2

## 2017-05-09 MED ORDER — VANCOMYCIN HCL IN DEXTROSE 1-5 GM/200ML-% IV SOLN
1000.0000 mg | Freq: Once | INTRAVENOUS | Status: AC
  Administered 2017-05-09: 1000 mg via INTRAVENOUS
  Filled 2017-05-09: qty 200

## 2017-05-09 MED ORDER — SODIUM CHLORIDE 0.9 % IV SOLN
1.5000 g | Freq: Two times a day (BID) | INTRAVENOUS | Status: DC
  Administered 2017-05-09 – 2017-05-10 (×3): 1.5 g via INTRAVENOUS
  Filled 2017-05-09 (×6): qty 1.5

## 2017-05-09 MED ORDER — CHLORHEXIDINE GLUCONATE 0.12 % MT SOLN
15.0000 mL | OROMUCOSAL | Status: AC
  Administered 2017-05-09: 15 mL via OROMUCOSAL

## 2017-05-09 MED ORDER — METOPROLOL TARTRATE 5 MG/5ML IV SOLN: 2.5000 mg | INTRAVENOUS | Status: DC | PRN

## 2017-05-09 MED ORDER — LEVOTHYROXINE SODIUM 50 MCG PO TABS
50.0000 ug | ORAL_TABLET | Freq: Every day | ORAL | Status: DC
  Administered 2017-05-10 – 2017-05-11 (×2): 50 ug via ORAL
  Filled 2017-05-09 (×2): qty 1

## 2017-05-09 MED ORDER — LIDOCAINE HCL 1 % IJ SOLN
INTRAMUSCULAR | Status: DC | PRN
  Administered 2017-05-09: 6 mL

## 2017-05-09 MED ORDER — SODIUM CHLORIDE 0.9 % IV SOLN
0.0000 ug/min | INTRAVENOUS | Status: DC
  Filled 2017-05-09: qty 2

## 2017-05-09 MED ORDER — NEBIVOLOL HCL 10 MG PO TABS
20.0000 mg | ORAL_TABLET | Freq: Every day | ORAL | Status: DC
  Administered 2017-05-10: 20 mg via ORAL
  Filled 2017-05-09 (×3): qty 2

## 2017-05-09 MED ORDER — HEPARIN SODIUM (PORCINE) 1000 UNIT/ML IJ SOLN
INTRAMUSCULAR | Status: AC
  Filled 2017-05-09: qty 1

## 2017-05-09 MED ORDER — NITROGLYCERIN 0.4 MG SL SUBL: 0.4000 mg | SUBLINGUAL_TABLET | SUBLINGUAL | Status: DC | PRN

## 2017-05-09 MED ORDER — LACTATED RINGERS IV SOLN: 500.0000 mL | Freq: Once | INTRAVENOUS | Status: DC | PRN

## 2017-05-09 MED ORDER — SODIUM CHLORIDE 0.9 % IV SOLN
1.0000 mL/kg/h | INTRAVENOUS | Status: AC
  Administered 2017-05-09: 1 mL/kg/h via INTRAVENOUS

## 2017-05-09 MED ORDER — SODIUM CHLORIDE 0.9 % IV SOLN
250.0000 mL | INTRAVENOUS | Status: DC | PRN
  Administered 2017-05-10: 20 mL via INTRAVENOUS

## 2017-05-09 SURGICAL SUPPLY — 106 items
BAG DECANTER FOR FLEXI CONT (MISCELLANEOUS) IMPLANT
BAG SNAP BAND KOVER 36X36 (MISCELLANEOUS) ×5 IMPLANT
BLADE CLIPPER SURG (BLADE) ×5 IMPLANT
BLADE STERNUM SYSTEM 6 (BLADE) IMPLANT
CABLE ADAPT CONN TEMP 6FT (ADAPTER) ×5 IMPLANT
CANISTER SUCT 3000ML PPV (MISCELLANEOUS) ×5 IMPLANT
CANNULA FEM VENOUS REMOTE 22FR (CANNULA) IMPLANT
CANNULA OPTISITE PERFUSION 16F (CANNULA) IMPLANT
CANNULA OPTISITE PERFUSION 18F (CANNULA) IMPLANT
CATH DIAG EXPO 6F FR4 (CATHETERS) ×5 IMPLANT
CATH DIAG EXPO 6F VENT PIG 145 (CATHETERS) ×10 IMPLANT
CATH EXPO 5FR AL1 (CATHETERS) ×5 IMPLANT
CATH INFINITI 6F AL2 (CATHETERS) IMPLANT
CATH S G BIP PACING (SET/KITS/TRAYS/PACK) ×5 IMPLANT
CLIP VESOCCLUDE MED 24/CT (CLIP) IMPLANT
CLIP VESOCCLUDE SM WIDE 24/CT (CLIP) IMPLANT
CONT SPEC 4OZ CLIKSEAL STRL BL (MISCELLANEOUS) ×15 IMPLANT
COVER BACK TABLE 80X110 HD (DRAPES) ×10 IMPLANT
COVER DOME SNAP 22 D (MISCELLANEOUS) IMPLANT
CRADLE DONUT ADULT HEAD (MISCELLANEOUS) ×5 IMPLANT
DERMABOND ADVANCED (GAUZE/BANDAGES/DRESSINGS) ×2
DERMABOND ADVANCED .7 DNX12 (GAUZE/BANDAGES/DRESSINGS) ×3 IMPLANT
DEVICE CLOSURE PERCLS PRGLD 6F (VASCULAR PRODUCTS) ×6 IMPLANT
DEVICE RAD COMP TR BAND LRG (VASCULAR PRODUCTS) ×5 IMPLANT
DRAPE INCISE IOBAN 66X45 STRL (DRAPES) IMPLANT
DRSG TEGADERM 2-3/8X2-3/4 SM (GAUZE/BANDAGES/DRESSINGS) ×5 IMPLANT
DRSG TEGADERM 4X4.75 (GAUZE/BANDAGES/DRESSINGS) ×10 IMPLANT
ELECT CAUTERY BLADE 6.4 (BLADE) IMPLANT
ELECT REM PT RETURN 9FT ADLT (ELECTROSURGICAL) ×5
ELECTRODE REM PT RTRN 9FT ADLT (ELECTROSURGICAL) ×3 IMPLANT
FELT TEFLON 6X6 (MISCELLANEOUS) IMPLANT
FEMORAL VENOUS CANN RAP (CANNULA) IMPLANT
GAUZE SPONGE 4X4 12PLY STRL (GAUZE/BANDAGES/DRESSINGS) ×5 IMPLANT
GAUZE SPONGE 4X4 12PLY STRL LF (GAUZE/BANDAGES/DRESSINGS) ×10 IMPLANT
GLIDESHEATH SLEND SS 6F .021 (SHEATH) ×5 IMPLANT
GLOVE BIO SURGEON STRL SZ7.5 (GLOVE) IMPLANT
GLOVE BIO SURGEON STRL SZ8 (GLOVE) ×5 IMPLANT
GLOVE BIOGEL PI IND STRL 6 (GLOVE) ×9 IMPLANT
GLOVE BIOGEL PI IND STRL 6.5 (GLOVE) ×9 IMPLANT
GLOVE BIOGEL PI IND STRL 7.5 (GLOVE) ×6 IMPLANT
GLOVE BIOGEL PI IND STRL 8 (GLOVE) ×6 IMPLANT
GLOVE BIOGEL PI INDICATOR 6 (GLOVE) ×6
GLOVE BIOGEL PI INDICATOR 6.5 (GLOVE) ×6
GLOVE BIOGEL PI INDICATOR 7.5 (GLOVE) ×4
GLOVE BIOGEL PI INDICATOR 8 (GLOVE) ×4
GLOVE ECLIPSE 7.5 STRL STRAW (GLOVE) ×10 IMPLANT
GLOVE EUDERMIC 7 POWDERFREE (GLOVE) IMPLANT
GLOVE ORTHO TXT STRL SZ7.5 (GLOVE) ×5 IMPLANT
GOWN STRL REUS W/ TWL LRG LVL3 (GOWN DISPOSABLE) ×9 IMPLANT
GOWN STRL REUS W/ TWL XL LVL3 (GOWN DISPOSABLE) ×12 IMPLANT
GOWN STRL REUS W/TWL LRG LVL3 (GOWN DISPOSABLE) ×6
GOWN STRL REUS W/TWL XL LVL3 (GOWN DISPOSABLE) ×8
GUIDEWIRE INQWIRE 1.5J.035X260 (WIRE) ×3 IMPLANT
GUIDEWIRE SAF TJ AMPL .035X180 (WIRE) ×5 IMPLANT
GUIDEWIRE SAFE TJ AMPLATZ EXST (WIRE) ×10 IMPLANT
GUIDEWIRE STRAIGHT .035 260CM (WIRE) ×5 IMPLANT
INQWIRE 1.5J .035X260CM (WIRE) ×5
INSERT FOGARTY SM (MISCELLANEOUS) IMPLANT
KIT BASIN OR (CUSTOM PROCEDURE TRAY) ×5 IMPLANT
KIT DILATOR VASC 18G NDL (KITS) IMPLANT
KIT HEART LEFT (KITS) ×5 IMPLANT
KIT SUCTION CATH 14FR (SUCTIONS) ×10 IMPLANT
KIT TURNOVER KIT B (KITS) ×5 IMPLANT
LOOP VESSEL MAXI BLUE (MISCELLANEOUS) IMPLANT
LOOP VESSEL MINI RED (MISCELLANEOUS) IMPLANT
NEEDLE HYPO 25GX1X1/2 BEV (NEEDLE) ×5 IMPLANT
NEEDLE PERC 18GX7CM (NEEDLE) ×5 IMPLANT
NS IRRIG 1000ML POUR BTL (IV SOLUTION) ×10 IMPLANT
PACK ENDOVASCULAR (PACKS) ×5 IMPLANT
PAD ARMBOARD 7.5X6 YLW CONV (MISCELLANEOUS) ×10 IMPLANT
PAD ELECT DEFIB RADIOL ZOLL (MISCELLANEOUS) ×5 IMPLANT
PENCIL BUTTON HOLSTER BLD 10FT (ELECTRODE) ×10 IMPLANT
PERCLOSE PROGLIDE 6F (VASCULAR PRODUCTS) ×10
SET MICROPUNCTURE 5F STIFF (MISCELLANEOUS) ×5 IMPLANT
SHEATH BRITE TIP 6FR 35CM (SHEATH) ×5 IMPLANT
SHEATH PINNACLE 6F 10CM (SHEATH) IMPLANT
SHEATH PINNACLE 8F 10CM (SHEATH) ×5 IMPLANT
SLEEVE REPOSITIONING LENGTH 30 (MISCELLANEOUS) ×5 IMPLANT
SPONGE LAP 4X18 X RAY DECT (DISPOSABLE) IMPLANT
STOPCOCK 4 WAY LG BORE MALE ST (IV SETS) ×5 IMPLANT
STOPCOCK MORSE 400PSI 3WAY (MISCELLANEOUS) ×10 IMPLANT
SUT ETHIBOND X763 2 0 SH 1 (SUTURE) IMPLANT
SUT GORETEX CV 4 TH 22 36 (SUTURE) IMPLANT
SUT GORETEX CV4 TH-18 (SUTURE) IMPLANT
SUT MNCRL AB 3-0 PS2 18 (SUTURE) IMPLANT
SUT PROLENE 5 0 C 1 36 (SUTURE) IMPLANT
SUT PROLENE 6 0 C 1 30 (SUTURE) IMPLANT
SUT SILK  1 MH (SUTURE) ×2
SUT SILK 1 MH (SUTURE) ×3 IMPLANT
SUT VIC AB 2-0 CT1 27 (SUTURE)
SUT VIC AB 2-0 CT1 TAPERPNT 27 (SUTURE) IMPLANT
SUT VIC AB 2-0 CTX 36 (SUTURE) IMPLANT
SUT VIC AB 3-0 SH 8-18 (SUTURE) IMPLANT
SYR 50ML LL SCALE MARK (SYRINGE) ×5 IMPLANT
SYR BULB IRRIGATION 50ML (SYRINGE) IMPLANT
SYR CONTROL 10ML LL (SYRINGE) ×5 IMPLANT
TOWEL GREEN STERILE (TOWEL DISPOSABLE) ×10 IMPLANT
TRANSDUCER W/STOPCOCK (MISCELLANEOUS) ×10 IMPLANT
TRAY FOLEY SLVR 14FR TEMP STAT (SET/KITS/TRAYS/PACK) IMPLANT
TUBE SUCT INTRACARD DLP 20F (MISCELLANEOUS) IMPLANT
TUBING ART PRESS 72  MALE/FEM (TUBING) ×4
TUBING ART PRESS 72 MALE/FEM (TUBING) ×6 IMPLANT
TUBING HIGH PRESSURE 120CM (CONNECTOR) ×5 IMPLANT
VALVE HEART TRANSCATH SZ3 26MM (Prosthesis & Implant Heart) ×5 IMPLANT
WIRE .035 3MM-J 145CM (WIRE) ×5 IMPLANT
WIRE BENTSON .035X145CM (WIRE) ×5 IMPLANT

## 2017-05-09 NOTE — Transfer of Care (Signed)
Immediate Anesthesia Transfer of Care Note  Patient: [name]  Procedure(s) Performed: TRANSCATHETER AORTIC VALVE REPLACEMENT, TRANSFEMORAL using a 26m Edwards Sapien 3 Aortic Valve (N/A Chest) TRANSESOPHAGEAL ECHOCARDIOGRAM (TEE) (N/A ) ARTERIAL LINE  INSERTION  RIGHT RADIAL ARTERY (Right Wrist)  Patient Location: ICU  Anesthesia Type:MAC  Level of Consciousness: awake and oriented  Airway & Oxygen Therapy: Patient Spontanous Breathing and Patient connected to nasal cannula oxygen  Post-op Assessment: Report given to RN  Post vital signs: Reviewed and stable  Last Vitals:  Vitals Value Taken Time  BP    Temp    Pulse    Resp    SpO2      Last Pain:  Vitals:   05/09/17 1000  TempSrc:   PainSc: 0-No pain         Complications: No apparent anesthesia complications

## 2017-05-09 NOTE — Progress Notes (Signed)
      [address]       [place] [address]             [phone]      S/p TAVR for severe AS  BP 139/86   Pulse (!) 59   Temp (!) 97.5 F (36.4 C) (Oral)   Resp (!) 24   Ht 5\' 7"  (1.702 m)   Wt 213 lb 13.5 oz (97 kg)   SpO2 97%   BMI 33.49 kg/m   Intake/Output Summary (Last 24 hours) at 05/09/2017 1850 Last data filed at 05/09/2017 1800 Gross per 24 hour  Intake 1700.91 ml  Output 50 ml  Net 1650.91 ml   K= 4.4, creatinine 0.6  Doing well early postop  Remo Lipps C. Roxan Hockey, MD [facility] [phone]

## 2017-05-09 NOTE — Anesthesia Procedure Notes (Signed)
Central Venous Catheter Insertion Performed by: Belinda Block, MD, anesthesiologist Start/End4/30/2019 9:45 AM, 05/09/2017 10:00 AM Preanesthetic checklist: patient identified, IV checked, site marked, risks and benefits discussed, surgical consent, monitors and equipment checked, pre-op evaluation and timeout performed Position: Trendelenburg Lidocaine 1% used for infiltration and patient sedated Hand hygiene performed , maximum sterile barriers used  and Seldinger technique used Central line was placed.Double lumen Procedure performed using ultrasound guided technique. Ultrasound Notes:anatomy identified and image(s) printed for medical record Attempts: 1 Following insertion, line sutured, dressing applied and Biopatch. Post procedure assessment: blood return through all ports  Patient tolerated the procedure well with no immediate complications.

## 2017-05-09 NOTE — Progress Notes (Signed)
  Echocardiogram 2D Echocardiogram limited has been performed.  Jennette Dubin 05/09/2017, 12:26 PM

## 2017-05-09 NOTE — Anesthesia Postprocedure Evaluation (Signed)
Anesthesia Post Note  Patient: [name]  Procedure(s) Performed: TRANSCATHETER AORTIC VALVE REPLACEMENT, TRANSFEMORAL using a 56m Edwards Sapien 3 Aortic Valve (N/A Chest) TRANSESOPHAGEAL ECHOCARDIOGRAM (TEE) (N/A ) ARTERIAL LINE  INSERTION  RIGHT RADIAL ARTERY (Right Wrist)     Patient location during evaluation: PACU Anesthesia Type: MAC Level of consciousness: awake Pain management: pain level controlled Vital Signs Assessment: post-procedure vital signs reviewed and stable Respiratory status: spontaneous breathing Cardiovascular status: stable Anesthetic complications: no    Last Vitals:  Vitals:   05/09/17 1311 05/09/17 1400  BP: 128/65 (!) 111/56  Pulse:    Resp: (!) 23 (!) 21  Temp:    SpO2: 98% 99%    Last Pain:  Vitals:   05/09/17 1255  TempSrc: Oral  PainSc:                  [employer]

## 2017-05-09 NOTE — Interval H&P Note (Signed)
History and Physical Interval Note:  05/09/2017 9:54 AM  [name]  has presented today for surgery, with the diagnosis of Severe Aortic Stenosis  The various methods of treatment have been discussed with the patient and family. After consideration of risks, benefits and other options for treatment, the patient has consented to  Procedure(s): TRANSCATHETER AORTIC VALVE REPLACEMENT, TRANSFEMORAL (N/A) TRANSESOPHAGEAL ECHOCARDIOGRAM (TEE) (N/A) as a surgical intervention .  The patient's history has been reviewed, patient examined, no change in status, stable for surgery.  I have reviewed the patient's chart and labs.  Questions were answered to the patient's satisfaction.     Sherren Mocha

## 2017-05-09 NOTE — Op Note (Signed)
[facility]   MULTIDISCIPLINARY HEART VALVE TEAM   TAVR OPERATIVE NOTE   Date of Procedure:  05/09/2017  Preoperative Diagnosis: Severe Aortic Stenosis   Postoperative Diagnosis: Same   Procedure:    Transcatheter Aortic Valve Replacement - Percutaneous Left Transfemoral Approach  Edwards Sapien 3 THV (size 26 mm, model # 9600TFX, serial # [id number])   Co-Surgeons:  Valentina Gu. Roxy Manns, MD and Sherren Mocha, MD  Anesthesiologist:  Belinda Block, MD  Echocardiographer:  Jenkins Rouge, MD  Pre-operative Echo Findings:  Severe aortic stenosis  Moderate aortic insufficiency  Normal left ventricular systolic function  Post-operative Echo Findings:  No paravalvular leak  Normal left ventricular systolic function   BRIEF CLINICAL NOTE AND INDICATIONS FOR SURGERY  Patient is a 77 year old obese female with long-standing history of coronary artery disease status post coronary artery bypass grafting in 1985 and redo coronary artery bypass grafting in 2001, aortic stenosis, hypertension, 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  The patient's cardiac history dates back to 1985 when she underwent coronary artery bypass grafting x4 by Dr. Redmond Pulling.  She did well and was followed by Dr. Doreatha Lew for many years.  In 2001 she underwent redo coronary artery bypass grafting x3 by Dr. Servando Snare.  She recovered uneventfully and has continued to do well from the standpoint of ischemic heart disease.  She developed heart murmur on physical exam and was noted to have aortic stenosis which has gradually progressed in severity over the last several years.  Over the past year or so she has experienced significant progression of  symptoms of exertional shortness of breath and fatigue.  Follow-up echocardiogram performed March 29, 2017 revealed normal left ventricular systolic function with severe aortic stenosis.  Peak velocity across the aortic valve measured 4.1 m/s corresponding to mean transvalvular gradient estimated 39 mmHg.  The DVI was reported 0.20 with aortic valve area calculated 0.69 cm.  Left ventricular ejection fraction was estimated 55-60%.  Left and right heart catheterization performed April 04, 2017 confirmed the presence of severe aortic stenosis with peak to peak and mean transvalvular gradients measured 35 and 32 mmHg, respectively, corresponding to aortic valve area calculated 1.1 cm.  There was severe native three-vessel coronary artery disease with continued patency of left internal mammary artery graft to the mid left anterior descending coronary artery, patent free right internal mammary artery graft to the distal left anterior descending coronary artery, and patient sequential saphenous vein graft to the first and second obtuse marginal branches respectively.  There was mild pulmonary hypertension.  The patient was referred to Dr. Burt Knack and underwent CT angiography.  Surgical consultation was requested.  During the course of the patient's preoperative work up they have been evaluated comprehensively by a multidisciplinary team of specialists coordinated through the [facility] in the [facility].  They have been demonstrated to suffer from symptomatic severe aortic stenosis as noted above. The patient has been counseled extensively as to the relative risks and benefits of all options for the treatment of severe aortic stenosis including long term medical therapy, conventional surgery for aortic valve replacement, and transcatheter aortic valve replacement.  All questions have been answered, and the patient provides full informed consent for the operation as  described.   DETAILS OF THE  OPERATIVE PROCEDURE  PREPARATION:    The patient is brought to the operating room on the above mentioned date and central monitoring was established by the anesthesia team including placement of a central venous line and radial arterial line. The patient is placed in the supine position on the operating table.  Intravenous antibiotics are administered. The patient is monitored closely throughout the procedure under conscious sedation.     Baseline transthoracic echocardiogram was performed. The patient's chest, abdomen, both groins, and both lower extremities are prepared and draped in a sterile manner. A time out procedure is performed.   PERIPHERAL ACCESS:    Using the modified Seldinger technique, femoral  venous access was obtained with placement of a 6 Fr sheath on the right side.  Right radial artery access was obtained by Dr. Burt Knack.  A pigtail diagnostic catheter was passed through the radial arterial sheath under fluoroscopic guidance into the aortic root.  A temporary transvenous pacemaker catheter was passed through the right femoral venous sheath under fluoroscopic guidance into the right ventricle.  The pacemaker was tested to ensure stable lead placement and pacemaker capture. Aortic root angiography was performed in order to determine the optimal angiographic angle for valve deployment.   TRANSFEMORAL ACCESS:   Percutaneous transfemoral access and sheath placement was performed by Dr. Burt Knack using ultrasound guidance.  The left common femoral artery was cannulated using a micropuncture needle and appropriate location was verified using hand injection angiogram.  A pair of Abbott Perclose percutaneous closure devices were placed and a 6 French sheath replaced into the femoral artery.  The patient was heparinized systemically and ACT verified > 250 seconds.    A 14 Fr transfemoral E-sheath was introduced into the left common femoral artery after  progressively dilating over an Amplatz superstiff wire. An AL-1 catheter was used to direct a straight-tip exchange length wire across the native aortic valve into the left ventricle. This was exchanged out for a pigtail catheter and position was confirmed in the LV apex. Simultaneous LV and Ao pressures were recorded.  The pigtail catheter was exchanged for an Amplatz Extra-stiff wire in the LV apex.  Echocardiography was utilized to confirm appropriate wire position and no sign of entanglement in the mitral subvalvular apparatus.   TRANSCATHETER HEART VALVE DEPLOYMENT:   An Edwards Sapien 3 transcatheter heart valve (size 26 mm, model #9600TFX, serial #[id number]) was prepared and crimped per manufacturer's guidelines, and the proper orientation of the valve is confirmed on the [employer] delivery system. The valve was advanced through the introducer sheath using normal technique until in an appropriate position in the abdominal aorta beyond the sheath tip. The balloon was then retracted and using the fine-tuning wheel was centered on the valve. The valve was then advanced across the aortic arch using appropriate flexion of the catheter. The valve was carefully positioned across the aortic valve annulus. The Commander catheter was retracted using normal technique. Once final position of the valve has been confirmed by angiographic assessment, the valve is deployed while temporarily holding ventilation and during rapid ventricular pacing to maintain systolic blood pressure < 50 mmHg and pulse pressure < 10 mmHg. The balloon inflation is held for >3 seconds after reaching full deployment volume. Once the balloon has fully deflated the balloon is retracted into the ascending aorta and valve function is assessed using echocardiography. There is felt to be no paravalvular leak and no central aortic insufficiency.  The patient's hemodynamic recovery following valve deployment is good.  The  deployment balloon  and guidewire are both removed.    PROCEDURE COMPLETION:   The sheath was removed and femoral artery closure performed by Dr Burt Knack.  Protamine was administered once femoral arterial repair was complete. The temporary pacemaker, pigtail catheters and femoral sheaths were removed with manual pressure used for hemostasis.   The patient tolerated the procedure well and is transported to the surgical intensive care in stable condition. There were no immediate intraoperative complications. All sponge instrument and needle counts are verified correct at completion of the operation.   No blood products were administered during the operation.  The patient received a total of 40.6 mL of intravenous contrast during the procedure.   Rexene Alberts, MD 05/09/2017 3:38 PM

## 2017-05-09 NOTE — Progress Notes (Signed)
TR band placed on right wrist as I removed the 6 french slender sheath from the right radial artery.  16 cc of air in the band.  Site looks good with no hematoma.

## 2017-05-09 NOTE — Op Note (Signed)
[facility]   MULTIDISCIPLINARY HEART VALVE TEAM   TAVR OPERATIVE NOTE   Date of Procedure:  05/09/2017  Preoperative Diagnosis: Severe Aortic Stenosis   Postoperative Diagnosis: Same   Procedure:    Transcatheter Aortic Valve Replacement - Percutaneous  Transfemoral Approach  Edwards Sapien 3 THV (size 26 mm, model # 9600TFX, serial # [id number])   Co-Surgeons:  Valentina Gu. Roxy Manns, MD and Sherren Mocha, MD  Anesthesiologist:  Dr Nyoka Cowden  Echocardiographer:  Dr Johnsie Cancel  Pre-operative Echo Findings:  Severe aortic stenosis  Normal left ventricular systolic function  Post-operative Echo Findings:  No paravalvular leak  Normal left ventricular systolic function  BRIEF CLINICAL NOTE AND INDICATIONS FOR SURGERY  77 yo woman with severe, symptomatic aortic stenosis. She has a history of extensive coronary artery disease and underwent redo CABG in 2001.  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    Dr Redmond Pulling performed her original surgery in about 1985 and Dr Servando Snare did her redo CABG in 2001. She underwent recent cardiac cath and echo studies. She's undergone CTA studies and formal cardiac surgical evaluation. TAVR is felt to be indicated for treatment of severe Stage D1 aortic stenosis.   During the course of the patient's preoperative work up they have been evaluated comprehensively by a multidisciplinary team of specialists coordinated through the [facility] in the [facility].  They have been demonstrated to suffer from symptomatic severe aortic stenosis as noted above. The patient has been counseled extensively as to the relative risks and benefits of all options for the treatment of severe aortic stenosis including long term medical therapy, conventional surgery for aortic valve replacement, and  transcatheter aortic valve replacement.  The patient has been independently evaluated by two cardiac surgeons including Dr. Roxy Manns and Dr. Cyndia Bent, and they are felt to be at high risk for conventional surgical aortic valve replacement based upon a predicted risk of mortality using the Society of Thor[employer] Surgeons risk calculator of 3.4%. Both surgeons indicated the patient would be a poor candidate for conventional surgery because of comorbidities including PAD and 2 previous sternotomies.   Based upon review of all of the patient's preoperative diagnostic tests they are felt to be candidate for transcatheter aortic valve replacement using the transfemoral approach as an alternative to high risk conventional surgery.    Following the decision to proceed with transcatheter aortic valve replacement, a discussion has been held regarding what types of management strategies would be attempted intraoperatively in the event of life-threatening complications, including whether or not the patient would be considered a candidate for the use of cardiopulmonary bypass and/or conversion to open sternotomy for attempted surgical intervention.  The patient has been advised of a variety of complications that might develop peculiar to this approach including but not limited to risks of death, stroke, paravalvular leak, aortic dissection or other major vascular complications, aortic annulus rupture, device embolization, cardiac rupture or perforation, acute myocardial infarction, arrhythmia, heart block or bradycardia requiring permanent pacemaker placement, congestive heart failure, respiratory failure, renal failure, pneumonia, infection, other late complications related to structural valve deterioration or migration, or other complications that might ultimately cause a temporary or permanent loss of functional independence or other long term morbidity.  The patient provides full informed consent for the procedure as described and  all questions were answered preoperatively.  DETAILS OF THE OPERATIVE PROCEDURE  PREPARATION:   The patient is brought to the operating room on the above mentioned date and central monitoring was established by the anesthesia team including placement of a central venous catheter and radial arterial line. The patient is placed in the supine position on the operating table.  Intravenous antibiotics are administered. The patient is monitored closely throughout the procedure under conscious sedation.  Baseline transthoracic echocardiogram is performed. The patient's chest, abdomen, both groins, and both lower extremities are prepared and draped in a sterile manner. A time out procedure is performed.  PERIPHERAL ACCESS:   Using ultrasound guidance, femoral venous access is obtained with placement of 6 Fr sheath on the right side. The right radial sheath is inserted using the modified seldinger technique.  A pigtail diagnostic catheter was passed through the radial arterial sheath under fluoroscopic guidance into the aortic root.  A temporary transvenous pacemaker catheter was passed through the femoral venous sheath under fluoroscopic guidance into the right ventricle.  The pacemaker was tested to ensure stable lead placement and pacemaker capture. Aortic root angiography was performed in order to determine the optimal angiographic angle for valve deployment.  TRANSFEMORAL ACCESS:  A micropuncture technique is used to access the left femoral artery under fluoroscopic and ultrasound guidance.  2 Perclose devices are deployed at 10' and 2' positions to 'PreClose' the femoral artery. An 8 French sheath is placed and then an Amplatz Superstiff wire is advanced through the sheath. This is changed out for a 14 French transfemoral E-Sheath after progressively dilating over the Superstiff wire.  A JR4 catheter was used to direct a straight-tip exchange length wire across the native aortic valve into the left  ventricle. This was exchanged out for a pigtail catheter and position was confirmed in the LV apex. Simultaneous LV and Ao pressures were recorded.  The pigtail catheter was exchanged for an Amplatz Extra-stiff wire in the LV apex.  Echocardiography was utilized to confirm appropriate wire position and no sign of entanglement in the mitral subvalvular apparatus.  BALLOON AORTIC VALVULOPLASTY:  Not performed   TRANSCATHETER HEART VALVE DEPLOYMENT:  An Edwards Sapien 3 transcatheter heart valve (size 26 mm, model #9600TFX, serial # [id number]) was prepared and crimped per manufacturer's guidelines, and the proper orientation of the valve is confirmed on the [employer] delivery system. The valve was advanced through the introducer sheath using normal technique until in an appropriate position in the abdominal aorta beyond the sheath tip. The balloon was then retracted and using the fine-tuning wheel was centered on the valve. The valve was then advanced across the aortic arch using appropriate flexion of the catheter. The valve was carefully positioned across the aortic valve annulus. The Commander catheter was retracted using normal technique. Once final position of the valve has been confirmed by angiographic assessment, the valve is deployed while temporarily holding ventilation and during rapid ventricular pacing to maintain systolic blood pressure < 50 mmHg and pulse pressure < 10 mmHg. The balloon inflation is held for >3 seconds after reaching full deployment volume. Once the balloon has fully deflated the balloon is retracted into the ascending aorta and valve function is assessed using echocardiography. There is felt to be no paravalvular leak and no central aortic insufficiency.  The patient's hemodynamic recovery following valve deployment is good.  The deployment balloon and guidewire are both removed. Echo demostrated acceptable post-procedural gradients, stable mitral valve function, and no  aortic insufficiency.   PROCEDURE COMPLETION:  The sheath was removed and femoral artery  closure is performed using the 2 previously deployed Perclose devices.  Protamine is administered once femoral arterial repair was complete. The site is clear with no evidence of bleeding or hematoma after the sutures are tightened. The temporary pacemaker, pigtail catheters and femoral sheaths were removed with manual pressure used for hemostasis.   The patient tolerated the procedure well and is transported to the surgical intensive care in stable condition. There were no immediate intraoperative complications. All sponge instrument and needle counts are verified correct at completion of the operation.   The patient received a total of 40 mL of intravenous contrast during the procedure.  Sherren Mocha, MD 05/09/2017 1:22 PM

## 2017-05-10 ENCOUNTER — Encounter (HOSPITAL_COMMUNITY): Payer: Self-pay | Admitting: Cardiovascular Disease

## 2017-05-10 ENCOUNTER — Inpatient Hospital Stay (HOSPITAL_COMMUNITY): Payer: Medicare Other

## 2017-05-10 ENCOUNTER — Other Ambulatory Visit: Payer: Self-pay

## 2017-05-10 DIAGNOSIS — Z952 Presence of prosthetic heart valve: Secondary | ICD-10-CM

## 2017-05-10 DIAGNOSIS — I350 Nonrheumatic aortic (valve) stenosis: Secondary | ICD-10-CM

## 2017-05-10 DIAGNOSIS — I361 Nonrheumatic tricuspid (valve) insufficiency: Secondary | ICD-10-CM

## 2017-05-10 LAB — BASIC METABOLIC PANEL
ANION GAP: 8 (ref 5–15)
BUN: 13 mg/dL (ref 6–20)
CALCIUM: 8.9 mg/dL (ref 8.9–10.3)
CHLORIDE: 101 mmol/L (ref 101–111)
CO2: 27 mmol/L (ref 22–32)
CREATININE: 0.86 mg/dL (ref 0.44–1.00)
GFR calc non Af Amer: >60 mL/min (ref >60)
Glucose, Bld: 100 mg/dL — ABNORMAL HIGH (ref 65–99)
Potassium: 4 mmol/L (ref 3.5–5.1)
Sodium: 136 mmol/L (ref 135–145)

## 2017-05-10 LAB — CBC
HEMATOCRIT: 34.5 % — AB (ref 36.0–46.0)
HEMOGLOBIN: 11.1 g/dL — AB (ref 12.0–15.0)
MCH: 32.2 pg (ref 26.0–34.0)
MCHC: 32.2 g/dL (ref 30.0–36.0)
MCV: 100 fL (ref 78.0–100.0)
Platelets: 129 10*3/uL — ABNORMAL LOW (ref 150–400)
RBC: 3.45 MIL/uL — ABNORMAL LOW (ref 3.87–5.11)
RDW: 14 % (ref 11.5–15.5)
WBC: 6.9 10*3/uL (ref 4.0–10.5)

## 2017-05-10 LAB — MAGNESIUM: MAGNESIUM: 1.6 mg/dL — AB (ref 1.7–2.4)

## 2017-05-10 LAB — ECHOCARDIOGRAM COMPLETE
HEIGHTINCHES: 67 in
WEIGHTICAEL: 3421.54 [oz_av]

## 2017-05-10 MED ORDER — CLOPIDOGREL BISULFATE 75 MG PO TABS
75.0000 mg | ORAL_TABLET | Freq: Every day | ORAL | Status: DC
  Administered 2017-05-10 – 2017-05-11 (×2): 75 mg via ORAL
  Filled 2017-05-10 (×2): qty 1

## 2017-05-10 MED ORDER — FUROSEMIDE 10 MG/ML IJ SOLN
20.0000 mg | Freq: Once | INTRAMUSCULAR | Status: AC
  Administered 2017-05-10: 20 mg via INTRAVENOUS
  Filled 2017-05-10: qty 2

## 2017-05-10 MED FILL — Potassium Chloride Inj 2 mEq/ML: INTRAVENOUS | Qty: 40 | Status: AC

## 2017-05-10 MED FILL — Heparin Sodium (Porcine) Inj 1000 Unit/ML: INTRAMUSCULAR | Qty: 30 | Status: AC

## 2017-05-10 MED FILL — Magnesium Sulfate Inj 50%: INTRAMUSCULAR | Qty: 10 | Status: AC

## 2017-05-10 NOTE — Progress Notes (Signed)
Patient arrived from 2 Heart. She is alert and oriented. Vital signs are stable. Patient groin areas are clean dry and intact with skin glue, level 0. Patient denies Patient is sitting up in a chair and is resting comfortably.

## 2017-05-10 NOTE — Progress Notes (Signed)
  Echocardiogram 2D Echocardiogram has been performed.  Kelvon Giannini T Leilan Bochenek 05/10/2017, 8:39 AM

## 2017-05-10 NOTE — Progress Notes (Signed)
Progress Note  Patient Name: [name] Date of Encounter: 05/10/2017  Primary Cardiologist: No primary care provider on file.   Subjective   Feels well, no chest pain or shortness of breath.  Complains of a headache.  Inpatient Medications    Scheduled Meds: . amLODipine  10 mg Oral Daily  . aspirin EC  81 mg Oral QHS  . FLUoxetine  40 mg Oral Daily  . levothyroxine  50 mcg Oral QAC breakfast  . mouth rinse  15 mL Mouth Rinse BID  . nebivolol  20 mg Oral QHS  . pantoprazole  40 mg Oral Daily  . simvastatin  40 mg Oral q1800  . sodium chloride flush  3 mL Intravenous Q12H   Continuous Infusions: . sodium chloride 20 mL (05/10/17 0000)  . albumin human    . cefUROXime (ZINACEF)  IV Stopped (05/09/17 2206)  . lactated ringers    . lactated ringers Stopped (05/09/17 1223)  . nitroGLYCERIN    . phenylephrine (NEO-SYNEPHRINE) Adult infusion     PRN Meds: sodium chloride, acetaminophen, albumin human, albuterol, ALPRAZolam, lactated ringers, loperamide, metoprolol tartrate, midazolam, morphine injection, nitroGLYCERIN, ondansetron (ZOFRAN) IV, oxyCODONE, simethicone, sodium chloride flush, traMADol   Vital Signs    Vitals:   05/10/17 0357 05/10/17 0400 05/10/17 0500 05/10/17 0600  BP:  111/60 (!) 110/56 (!) 107/55  Pulse:      Resp:  14 17 19   Temp: 98.3 F (36.8 C)     TempSrc: Oral     SpO2:  95% 94% (!) 89%  Weight:      Height:        Intake/Output Summary (Last 24 hours) at 05/10/2017 0647 Last data filed at 05/10/2017 0600 Gross per 24 hour  Intake 2563.41 ml  Output 1075 ml  Net 1488.41 ml   Filed Weights   05/09/17 0810 05/09/17 1300  Weight: 214 lb 14.4 oz (97.5 kg) 213 lb 13.5 oz (97 kg)    Telemetry    Normal sinus rhythm, few PVCs, heart rate 60s - Personally Reviewed  ECG    This morning's EKG is pending- Personally Reviewed   Physical Exam  Alert, oriented woman in no distress GEN: No acute distress.   Neck: No JVD Cardiac: RRR, 2/6  systolic ejection murmur at the right upper sternal border, no diastolic murmur Respiratory: Clear to auscultation bilaterally. GI: Soft, nontender, non-distended  MS: No edema; No deformity.  Bilateral groin sites are clear, radial site is clear Neuro:  Nonfocal  Psych: Normal affect   Labs    Chemistry Recent Labs  Lab 05/05/17 0908  05/09/17 1301 05/09/17 1344 05/10/17 0514  NA 136   < > 140 140 136  K 4.3   < > 4.0 4.4 4.0  CL 103   < > 105 102 101  CO2 19*  --   --   --  27  GLUCOSE 110*   < > 111* 108* 100*  BUN 15   < > 16 12 13   CREATININE 1.02*   < > 0.70 0.60 0.86  CALCIUM 9.3  --   --   --  8.9  PROT 6.3*  --   --   --   --   ALBUMIN 3.4*  --   --   --   --   AST 25  --   --   --   --   ALT 24  --   --   --   --  ALKPHOS 78  --   --   --   --   BILITOT 1.3*  --   --   --   --   GFRNONAA 53*  --   --   --  >60  GFRAA >60  --   --   --  >60  ANIONGAP 14  --   --   --  8   < > = values in this interval not displayed.     Hematology Recent Labs  Lab 05/05/17 0908  05/09/17 1301 05/09/17 1344 05/10/17 0514  WBC 6.9  --   --   --  6.9  RBC 3.94  --   --   --  3.45*  HGB 12.5   < > 10.2* 12.6 11.1*  HCT 38.6   < > 30.0* 37.0 34.5*  MCV 98.0  --   --   --  100.0  MCH 31.7  --   --   --  32.2  MCHC 32.4  --   --   --  32.2  RDW 14.3  --   --   --  14.0  PLT 161  --   --   --  129*   < > = values in this interval not displayed.    Cardiac EnzymesNo results for input(s): TROPONINI in the last 168 hours. No results for input(s): TROPIPOC in the last 168 hours.   BNP Recent Labs  Lab 05/05/17 0908  BNP 492.6*     DDimer No results for input(s): DDIMER in the last 168 hours.   Radiology    Dg Chest Port 1 View  Result Date: 05/09/2017 CLINICAL DATA:  Postop T AVR. EXAM: PORTABLE CHEST 1 VIEW COMPARISON:  Radiographs 05/05/2017.  Chest CT 04/18/2017. FINDINGS: 1249 hours. There is stable cardiomegaly status post median sternotomy and CABG. The  transcatheter aortic valve replacement appears well positioned. Right IJ central venous catheter extends to the superior cavoatrial junction. There is chronic elevation of the right hemidiaphragm with lower lung volumes and new mild pulmonary edema. No pneumothorax or significant pleural effusion. Telemetry leads overlie the chest. IMPRESSION: Interval trans catheter aortic valve replacement with lower lung volumes and mild pulmonary edema. No pneumothorax. Electronically Signed   By: Richardean Sale M.D.   On: 05/09/2017 13:22    Cardiac Studies   Postoperative day #1 echocardiogram pending  Patient Profile     77 y.o. female presents for elective TAVR for treatment of severe, symptomatic aortic stenosis,  Assessment & Plan    1.  Severe aortic stenosis status post TAVR: No early apparent complications.  The patient's transcatheter heart valve function appears normal based on intraoperative echo assessment.  She will have a postoperative day #1 echocardiogram today.  We will plan to mobilize her and transfer her to a telemetry bed with anticipated hospital discharge tomorrow.  She will be treated with dual antiplatelet therapy using aspirin and clopidogrel.  2.  Coronary artery disease status post CABG: No anginal symptoms.  Continue medical therapy.  3.  Hypertension: Blood pressure is well controlled.  Currently on amlodipine and nebivolol.  Has not been started back on irbesartan or chlorthalidone yet.  4.  Acute on chronic diastolic heart failure: The patient has had progressive dyspnea, elevated BNP on preoperative assessment, and interstitial edema on chest x-ray.  Likely secondary to severe aortic stenosis, now treated with TAVR.  Will gently diurese today with furosemide.  Disposition: Anticipate transfer to floor today, postoperative day #1  echo, hospital discharge tomorrow.   For questions or updates, please contact [place] Please consult [email or web address] for contact info  under Cardiology/STEMI.      Signed, Sherren Mocha, MD  05/10/2017, 6:47 AM

## 2017-05-11 LAB — BASIC METABOLIC PANEL
ANION GAP: 7 (ref 5–15)
BUN: 12 mg/dL (ref 6–20)
CO2: 25 mmol/L (ref 22–32)
Calcium: 8.8 mg/dL — ABNORMAL LOW (ref 8.9–10.3)
Chloride: 103 mmol/L (ref 101–111)
Creatinine, Ser: 1.05 mg/dL — ABNORMAL HIGH (ref 0.44–1.00)
GFR calc Af Amer: 59 mL/min — ABNORMAL LOW (ref >60)
GFR, EST NON AFRICAN AMERICAN: 51 mL/min — AB (ref >60)
Glucose, Bld: 108 mg/dL — ABNORMAL HIGH (ref 65–99)
POTASSIUM: 3.8 mmol/L (ref 3.5–5.1)
Sodium: 135 mmol/L (ref 135–145)

## 2017-05-11 MED ORDER — CLOPIDOGREL BISULFATE 75 MG PO TABS: 75.0000 mg | ORAL_TABLET | Freq: Every day | ORAL | 5 refills | Status: DC

## 2017-05-11 NOTE — Progress Notes (Signed)
Progress Note  Patient Name: [name] Date of Encounter: 05/11/2017  Primary Cardiologist: Peter Martinique, MD   Subjective   Feels well. Breathing improved when she walks. No CP or other complaints.  Inpatient Medications    Scheduled Meds: . amLODipine  10 mg Oral Daily  . aspirin EC  81 mg Oral QHS  . clopidogrel  75 mg Oral Daily  . FLUoxetine  40 mg Oral Daily  . levothyroxine  50 mcg Oral QAC breakfast  . nebivolol  20 mg Oral QHS  . pantoprazole  40 mg Oral Daily  . simvastatin  40 mg Oral q1800   Continuous Infusions: . sodium chloride 20 mL (05/10/17 0000)  . cefUROXime (ZINACEF)  IV Stopped (05/10/17 2317)   PRN Meds: sodium chloride, acetaminophen, albuterol, ALPRAZolam, loperamide, metoprolol tartrate, nitroGLYCERIN, ondansetron (ZOFRAN) IV, oxyCODONE, simethicone, traMADol   Vital Signs    Vitals:   05/10/17 1600 05/10/17 1622 05/10/17 1940 05/11/17 0527  BP: 130/78     Pulse:      Resp: (!) 23     Temp:  98 F (36.7 C) 98.6 F (37 C) 98.1 F (36.7 C)  TempSrc:  Oral Oral Oral  SpO2: 99%     Weight:      Height:        Intake/Output Summary (Last 24 hours) at 05/11/2017 1347 Last data filed at 05/11/2017 1100 Gross per 24 hour  Intake 360 ml  Output 200 ml  Net 160 ml   Filed Weights   05/09/17 0810 05/09/17 1300  Weight: 214 lb 14.4 oz (97.5 kg) 213 lb 13.5 oz (97 kg)    Telemetry    Normal sinus rhythm - Personally Reviewed   Physical Exam  Alert, oriented woman in NAD GEN: No acute distress.   Neck: No JVD Cardiac: RRR, soft SEM at the RUSB  Respiratory: Clear to auscultation bilaterally. GI: Soft, nontender, non-distended  MS: No edema; No deformity. Left groin site clear Neuro:  Nonfocal  Psych: Normal affect   Labs    Chemistry Recent Labs  Lab 05/05/17 0908  05/09/17 1344 05/10/17 0514 05/11/17 0312  NA 136   < > 140 136 135  K 4.3   < > 4.4 4.0 3.8  CL 103   < > 102 101 103  CO2 19*  --   --  27 25  GLUCOSE  110*   < > 108* 100* 108*  BUN 15   < > 12 13 12   CREATININE 1.02*   < > 0.60 0.86 1.05*  CALCIUM 9.3  --   --  8.9 8.8*  PROT 6.3*  --   --   --   --   ALBUMIN 3.4*  --   --   --   --   AST 25  --   --   --   --   ALT 24  --   --   --   --   ALKPHOS 78  --   --   --   --   BILITOT 1.3*  --   --   --   --   GFRNONAA 53*  --   --  >60 51*  GFRAA >60  --   --  >60 59*  ANIONGAP 14  --   --  8 7   < > = values in this interval not displayed.     Hematology Recent Labs  Lab 05/05/17 0908  05/09/17 1301 05/09/17 1344  05/10/17 0514  WBC 6.9  --   --   --  6.9  RBC 3.94  --   --   --  3.45*  HGB 12.5   < > 10.2* 12.6 11.1*  HCT 38.6   < > 30.0* 37.0 34.5*  MCV 98.0  --   --   --  100.0  MCH 31.7  --   --   --  32.2  MCHC 32.4  --   --   --  32.2  RDW 14.3  --   --   --  14.0  PLT 161  --   --   --  129*   < > = values in this interval not displayed.    Cardiac EnzymesNo results for input(s): TROPONINI in the last 168 hours. No results for input(s): TROPIPOC in the last 168 hours.   BNP Recent Labs  Lab 05/05/17 0908  BNP 492.6*     DDimer No results for input(s): DDIMER in the last 168 hours.   Radiology    No results found.  Cardiac Studies   2D echocardiogram (postop day #1 study): Left ventricle:   Wall thickness was increased in a pattern of mild LVH.   Systolic function was normal. The estimated ejection fraction was in the range of 60% to 65%. The transmitral flow pattern was normal. The deceleration time of the early transmitral flow velocity was normal. The pulmonary vein flow pattern was normal. The tissue Doppler parameters were normal. Left ventricular diastolic function parameters were normal. Doppler parameters are consistent with both elevated ventricular end-diastolic filling pressure and elevated left atrial filling pressure.  ------------------------------------------------------------------- Aortic valve:  Post TAVR with 26 mm Sapien 3 no  significant peri valvular regurgitation Gradients have increased some since OR. Doppler:     VTI ratio of LVOT to aortic valve: 0.57. Valve area (VTI): 1.62 cm^2. Indexed valve area (VTI): 0.74 cm^2/m^2. Peak velocity ratio of LVOT to aortic valve: 0.54. Valve area (Vmax): 1.52 cm^2. Indexed valve area (Vmax): 0.7 cm^2/m^2. Mean velocity ratio of LVOT to aortic valve: 0.55. Valve area (Vmean): 1.57 cm^2. Indexed valve area (Vmean): 0.72 cm^2/m^2.    Mean gradient (S): 11 mm Hg. Peak gradient (S): 23 mm Hg.  ------------------------------------------------------------------- Mitral valve:   Calcified annulus. Mildly thickened leaflets . Doppler:  There was mild regurgitation.    Valve area by pressure half-time: 1.54 cm^2. Indexed valve area by pressure half-time: 0.71 cm^2/m^2.    Peak gradient (D): 6 mm Hg.  ------------------------------------------------------------------- Left atrium:  The atrium was moderately dilated.  ------------------------------------------------------------------- Atrial septum:  No defect or patent foramen ovale was identified.   ------------------------------------------------------------------- Right ventricle:  The cavity size was normal. Wall thickness was normal. Systolic function was normal.  ------------------------------------------------------------------- Pulmonic valve:    Doppler:  There was mild regurgitation.  ------------------------------------------------------------------- Tricuspid valve:   Doppler:  There was mild regurgitation.  ------------------------------------------------------------------- Right atrium:  The atrium was normal in size.  ------------------------------------------------------------------- Pericardium:  The pericardium was normal in appearance.  ------------------------------------------------------------------- Systemic veins: Inferior vena cava: The vessel was normal in size. The respirophasic  diameter changes were in the normal range (>= 50%), consistent with normal central venous pressure.  Patient Profile     77 y.o. female presents for elective TAVR for treatment of severe, symptomatic aortic stenosis  Assessment & Plan    1.  Severe aortic stenosis: Patient is postoperative day #2 from TAVR.  She has progressed very well and is stable for discharge.  We will continue aspirin and clopidogrel for 6 months.  Postoperative echocardiogram demonstrates normal transcatheter heart valve function with a mean gradient of 11 mmHg and no significant paravalvular regurgitation.  2.  Coronary artery disease status post CABG: No symptoms of angina.  3.  Acute on chronic diastolic heart failure: Labs stable this morning after receiving IV furosemide yesterday.  We will continue her antihypertensive regimen.  I think Lasix can be used on an as-needed basis that she was previously doing.  4. HTN: resume chlorthalidone and avapro at discharge  Disposition: Home today, APP follow-up 1 week, 30-day valve clinic follow-up per protocol.  For questions or updates, please contact [place] Please consult [email or web address] for contact info under Cardiology/STEMI.      Signed, Sherren Mocha, MD  05/11/2017, 1:47 PM

## 2017-05-11 NOTE — Progress Notes (Signed)
CARDIAC REHAB PHASE I   PRE:  Rate/Rhythm: 74 SR  BP:  Supine:   Sitting: 134/69  Standing:    SaO2: 98%RA  MODE:  Ambulation: 400 ft   POST:  Rate/Rhythm: 91  BP:  Supine: 135/55  Sitting:   Standing:    SaO2: 97%RA 1015-1045 Pt walked 400 ft on RA independently with steady gait. Stopped several times due to SOB and right leg PVD. Tolerated well for first walk in hall. Pt stated she was a little less SOB than usual. Gave fake cigarette and smoking cessation handout, heart healthy diet and encouraged walking as tolerated. Referring to [place] Phase 2.   Graylon Good, RN BSN  05/11/2017 10:41 AM

## 2017-05-11 NOTE — Discharge Summary (Signed)
Discharge Summary    Patient ID: [name],  MRN: [id number], DOB/AGE: [birth year] 77 y.o.  Admit date: 05/09/2017 Discharge date: 05/11/2017  Primary Care Provider: [name] Primary Cardiologist: Peter Martinique, MD  Discharge Diagnoses    Principal Problem:   S/P TAVR (transcatheter aortic valve replacement) Active Problems:   ANEMIA, HX OF   CAD (coronary artery disease)   HTN (hypertension)   Obesities, morbid ([place])   Aortic stenosis, severe   S/P redo CABG x 3   S/P CABG x 4   Severe aortic stenosis   Allergies Allergies  Allergen Reactions  . Chantix [Varenicline Tartrate] Hives  . Dipyridamole Hives    Diagnostic Studies/Procedures     05/10/17 2D echocardiogram (postop day #1 study): Left ventricle: Wall thickness was increased in a pattern of mild LVH. Systolic function was normal. The estimated ejection fraction was in the range of 60% to 65%. The transmitral flow pattern was normal. The deceleration time of the early transmitral flow velocity was normal. The pulmonary vein flow pattern was normal. The tissue Doppler parameters were normal. Left ventricular diastolic function parameters were normal. Doppler parameters are consistent with both elevated ventricular end-diastolic filling pressure and elevated left atrial filling pressure.  05/09/2017 Transcatheter Aortic Valve Replacement - Percutaneous Left Transfemoral Approach             Edwards Sapien 3 THV (size 26 mm, model # 9600TFX, serial # [id number])  Pre-operative Echo Findings: ? Severe aortic stenosis ? Moderate aortic insufficiency ? Normal left ventricular systolic function  Post-operative Echo Findings: ? No paravalvular leak ? Normal left ventricular systolic function    History of Present Illness     Per Dr. 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  Redmond Pulling and redo CABG x3 by Dr. Servando Snare in 2001.  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  Over the past year she has had progression of exertional shortness of breath and fatigue.  She said this point she can get outside to walk and has become very sedentary.  She has had some orthopnea when lying down at night causing her to sit upright in bed.  She has had mild lower extremity edema recently and has noted weight gain over the past 6 months.  She denies any chest discomfort.  She denies dizziness and syncope.  She had a follow-up echocardiogram in March 2019 which showed a peak velocity across aortic valve of 4.1 m/s corresponding to a mean transvalvular gradient of 39 mmHg.  The dimensionless index was 0.2.  Aortic valve area was 0.69 cm.  Left ventricular ejection fraction was 55-60%.  Cardiac catheterization on April 04, 2017 showed a peak to peak gradient of 35 mmHg and a mean gradient of 32 mmHg.  There was severe native three-vessel coronary disease with continued patency of her left internal mammary graft to the mid LAD, patency of a free right internal mammary graft to the distal LAD, and a patent sequential saphenous vein graft to the first and second obtuse marginal branches of the left circumflex coronary artery.  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  She is married and lives with her husband in [place].  She has a daughter who lives with her.  She has been very active until the past 6-12 months and was limited only by some chronic pain in her right knee and claudication in the right calf.  She said that her main limitation now is shortness of breath and fatigue with low level activity.  The decision was made to proceed with transcatheter aortic valve replacement.   Hospital  Course     Consultants: None  1.  Severe aortic stenosis: Patient is postoperative day #2 from TAVR done on 05/09/17.  She has progressed very well and is stable for discharge.  We will continue aspirin and clopidogrel for 6 months.  Postoperative echocardiogram demonstrates normal transcatheter heart valve function with a mean gradient of 11 mmHg and no significant paravalvular regurgitation.  2.  Coronary artery disease status post CABG: No symptoms of angina.  3.  Acute on chronic diastolic heart failure: Labs stable this morning after receiving IV furosemide yesterday.  We will continue her antihypertensive regimen.  Lasix can be used on an as-needed basis that she was previously doing.  4. HTN: resume chlorthalidone and avapro at discharge  Disposition: Home today, APP follow-up 1 week, 30-day valve clinic follow-up per protocol.  Patient has been seen by Dr. Burt Knack today and deemed ready for discharge home. All follow up appointments have been scheduled. Discharge medications are listed below. _____________  Discharge Vitals Blood pressure 130/78, pulse (!) 59, temperature 98.1 F (36.7 C), temperature source Oral, resp. rate (!) 23, height 5\' 7"  (1.702 m), weight 213 lb 13.5 oz (97 kg), SpO2 99 %.  Filed Weights   05/09/17 0810 05/09/17 1300  Weight: 214 lb 14.4 oz (97.5 kg) 213 lb 13.5 oz (97 kg)    Labs & Radiologic Studies    CBC Recent Labs    05/09/17 1344 05/10/17 0514  WBC  --  6.9  HGB 12.6 11.1*  HCT 37.0 34.5*  MCV  --  100.0  PLT  --  073*   Basic Metabolic Panel Recent Labs    05/10/17 0514 05/11/17 0312  NA 136 135  K 4.0 3.8  CL 101 103  CO2 27 25  GLUCOSE 100* 108*  BUN 13 12  CREATININE 0.86 1.05*  CALCIUM 8.9 8.8*  MG 1.6*  --    Liver Function Tests No results for input(s): AST, ALT, ALKPHOS, BILITOT, PROT, ALBUMIN in the last 72 hours. No results for input(s): LIPASE, AMYLASE in the last 72 hours. Cardiac Enzymes No results for  input(s): CKTOTAL, CKMB, CKMBINDEX, TROPONINI in the last 72 hours. BNP Invalid input(s): POCBNP D-Dimer No results for input(s): DDIMER in the last 72 hours. Hemoglobin A1C No results for input(s): HGBA1C in the last 72 hours. Fasting Lipid Panel No results for input(s): CHOL, HDL, LDLCALC, TRIG, CHOLHDL, LDLDIRECT in the last 72 hours. Thyroid Function Tests No results for input(s): TSH, T4TOTAL, T3FREE, THYROIDAB in the last 72 hours.  Invalid input(s): FREET3 _____________  Dg Chest 2 View  Result Date: 05/05/2017 CLINICAL DATA:  Shortness of breath.  Aortic stenosis. EXAM: CHEST - 2 VIEW COMPARISON:  Radiographs of March 31, 2017. FINDINGS: Stable cardiomegaly. Status post coronary artery bypass graft. Atherosclerosis of thoracic aorta is noted. No pneumothorax or pleural effusion is noted. Stable elevation of right hemidiaphragm. Stable bibasilar subsegmental atelectasis or scarring. Bony thorax is unremarkable. IMPRESSION: Stable bibasilar subsegmental atelectasis or scarring. Aortic Atherosclerosis (ICD10-I70.0). Electronically Signed   By: Marijo Conception, M.D.   On: 05/05/2017  11:42   Ct Coronary Morph W/cta Cor W/score W/ca W/cm &/or Wo/cm  Addendum Date: 04/18/2017   ADDENDUM REPORT: 04/18/2017 11:18 CLINICAL DATA:  Aortic stenosis EXAM: Cardiac TAVR CT TECHNIQUE: The patient was scanned on a [employer] 192 scanner. A 120 kV retrospective scan was triggered in the descending thoracic aorta at 111 HU's. Gantry rotation speed was 270 msecs and collimation was .9 mm. No beta blockade or nitro were given. The 3D data set was reconstructed in 5% intervals of the R-R cycle. Systolic and diastolic phases were analyzed on a dedicated work station using MPR, MIP and VRT modes. The patient received 80 cc of contrast. FINDINGS: Aortic Valve: Calcified tri leaflet with restricted motion Aorta: Moderate calcific aortic atherosclerosis with normal arch vessels and no aneurysm Sinotubular  Junction: 24 mm Ascending Thoracic Aorta: 32 mm Aortic Arch: 30 mm Descending Thoracic Aorta: 28 mm Sinus of Valsalva Measurements: Non-coronary: 29 mm Right - coronary: 28.3 mm Left - coronary: 28.4 mm Coronary Artery Height above Annulus: Left Main: 10 mm above annulus Right Coronary: 12 mm above annulus Patent LIMA to LAD Patent SVG to LAD Patent SVG to OM Virtual Basal Annulus Measurements: Maximum/Minimum Diameter: 26.5 mm x 21.2 mm Perimeter: 75.6 mm Area: 416 mm2 Coronary Arteries: Sufficient height above annulus for deployment with patent left sided grafts Optimum Fluoroscopic Angle for Delivery: LAO 9 degrees Cranial 7 degrees IMPRESSION: 1. Calcified tri leaflet AV with annular area of 416 mm2 suitable for a 23 mm Sapien 3 valve 2. Coronary arteries sufficient height above annulus for deployment with patent left sided grafts 3.  No LAA Thrombus 4. Optimum angiographic angle for deployment LAO 9 degrees Cranial 7 degrees 5. Normal aortic root 3.2 cm with moderate calcific aortic atherosclerosis [place] Rouge Electronically Signed   By: Jenkins Rouge M.D.   On: 04/18/2017 11:18   Result Date: 04/18/2017 EXAM: OVER-READ INTERPRETATION  CT CHEST The following report is an over-read performed by radiologist Dr. Vinnie Langton of [facility], [place] on 04/18/2017. This over-read does not include interpretation of cardiac or coronary anatomy or pathology. The coronary CTA interpretation by the cardiologist is attached. COMPARISON:  None. FINDINGS: Extracardiac findings will be described separately under dictation for contemporaneously obtained CTA chest, abdomen and pelvis. IMPRESSION: Please see separate dictation for contemporaneously obtained CTA chest, abdomen and pelvis dated 04/18/2017 for full description of relevant extracardiac findings. Electronically Signed: By: Vinnie Langton M.D. On: 04/18/2017 11:06   Dg Chest Port 1 View  Result Date: 05/09/2017 CLINICAL DATA:  Postop T AVR. EXAM: PORTABLE  CHEST 1 VIEW COMPARISON:  Radiographs 05/05/2017.  Chest CT 04/18/2017. FINDINGS: 1249 hours. There is stable cardiomegaly status post median sternotomy and CABG. The transcatheter aortic valve replacement appears well positioned. Right IJ central venous catheter extends to the superior cavoatrial junction. There is chronic elevation of the right hemidiaphragm with lower lung volumes and new mild pulmonary edema. No pneumothorax or significant pleural effusion. Telemetry leads overlie the chest. IMPRESSION: Interval trans catheter aortic valve replacement with lower lung volumes and mild pulmonary edema. No pneumothorax. Electronically Signed   By: Richardean Sale M.D.   On: 05/09/2017 13:22   Ct Angio Chest Aorta W &/or Wo Contrast  Result Date: 04/18/2017 CLINICAL DATA:  77 year old female with history of severe aortic stenosis. Preprocedural study prior to potential transcatheter aortic valve replacement (TAVR) procedure. EXAM: CT ANGIOGRAPHY CHEST, ABDOMEN AND PELVIS TECHNIQUE: Multidetector CT imaging through the chest, abdomen and pelvis was performed using the  standard protocol during bolus administration of intravenous contrast. Multiplanar reconstructed images and MIPs were obtained and reviewed to evaluate the vascular anatomy. CONTRAST:  172mL ISOVUE-370 IOPAMIDOL (ISOVUE-370) INJECTION 76% COMPARISON:  CT the abdomen and pelvis 04/15/2011. No prior chest CT. FINDINGS: CTA CHEST FINDINGS Cardiovascular: Heart size is enlarged with severe left atrial dilatation. There is no significant pericardial fluid, thickening or pericardial calcification. There is aortic atherosclerosis, as well as atherosclerosis of the great vessels of the mediastinum and the coronary arteries, including calcified atherosclerotic plaque in the left anterior descending, left circumflex and right coronary arteries. Status post median sternotomy for CABG. Severe thickening calcification of the aortic valve. Mild calcification of  the mitral annulus. Mediastinum/Lymph Nodes: No pathologically enlarged mediastinal or hilar lymph nodes. Small hiatal hernia. Esophagus is otherwise unremarkable in appearance. No axillary lymphadenopathy. Lungs/Pleura: No suspicious appearing pulmonary nodules or masses. No acute consolidative airspace disease. No pleural effusions. Musculoskeletal/Soft Tissues: Median sternotomy wires. Old sternal fracture with what appears to be fibrous union and posttraumatic deformity. There are no aggressive appearing lytic or blastic lesions noted in the visualized portions of the skeleton. CTA ABDOMEN AND PELVIS FINDINGS Hepatobiliary: No suspicious cystic or solid hepatic lesions. No intra or extrahepatic biliary ductal dilatation. Status post cholecystectomy. Pancreas: No pancreatic mass. No pancreatic ductal dilatation. No pancreatic or peripancreatic fluid or inflammatory changes. Spleen: Unremarkable. Adrenals/Urinary Tract: 3.0 x 4.1 x 3.1 cm enhancing mass in the interpolar region of the right kidney (axial image 100 of series 15 and coronal image 67 of series 16). This lesion is new compared to prior study from 04/15/2011, does not extend into the right renal vein (which is widely patent), and is completely encapsulated within Gerota's fascia. Several subcentimeter low-attenuation lesions in both kidneys, too small to definitively characterize, but favored to represent tiny cysts. No hydroureteronephrosis. Urinary bladder is normal in appearance. Bilateral adrenal glands are normal in appearance. 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 No pathologic dilatation of small bowel or colon. Numerous colonic diverticulae are noted, particularly in the region of the sigmoid colon, without surrounding inflammatory changes to suggest an acute diverticulitis at this time. The appendix is not confidently identified and may be surgically absent. Regardless, there are no inflammatory changes  noted adjacent to the cecum to suggest the presence of an acute appendicitis at this time. Vascular/Lymphatic: Aortic atherosclerosis with fusiform aneurysmal dilatation of the infrarenal abdominal aorta which measures up to 3.4 x 3.0 cm (axial image 113 of series 15). 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 No lymphadenopathy noted in the abdomen or pelvis. Reproductive: Uterus and ovaries are a trophic and otherwise unremarkable in appearance. Other: No significant volume of ascites.  No pneumoperitoneum. Musculoskeletal: Chronic appearing compression fracture of L1 with 20% loss of anterior vertebral body height. There are no aggressive appearing lytic or blastic lesions noted in the visualized portions of the skeleton. VASCULAR MEASUREMENTS PERTINENT TO TAVR: AORTA: Minimal Aortic Diameter-11 x 10 mm Severity of Aortic Calcification-severe RIGHT PELVIS: Not assessed because there is complete occlusion of the right common iliac artery (there is distal reconstitution of flow secondary to collateralization). LEFT PELVIS: Left Common Iliac Artery - Minimal Diameter-6.7 x 5.5 mm Tortuosity-mild Calcification-moderate Left External Iliac Artery - Minimal Diameter-6.2 x 6.1 mm Tortuosity-mild Calcification-mild-to-moderate Left  Common Femoral Artery - Minimal Diameter-7.2 x 7.3 mm Tortuosity-mild Calcification-moderate Review of the MIP images confirms the above findings. IMPRESSION: 1. Vascular findings and measurements pertinent to potential TAVR procedure, as detailed above. 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 2. Severe thickening calcification of the aortic valve, compatible with the reported clinical  history of severe aortic stenosis. 3. New large right renal mass measuring 3.0 x 4.1 x 3.1 cm highly concerning for renal cell carcinoma. This is completely encapsulated within Gerota's fascia, does not involve the right renal vein, and is not associated with local lymphadenopathy or definite findings to suggest metastatic disease in the chest, abdomen or pelvis. Urologic consultation is recommended in the near future for further evaluation. 4. Aortic atherosclerosis, in addition to left main and 3 vessel coronary artery disease. Status post median sternotomy for CABG. 5. In addition, there is an infrarenal abdominal aortic aneurysm measuring up to 3.4 x 3.0 cm (mean diameter of 3.2 cm). Recommend followup by ultrasound in 3 years. This recommendation follows ACR consensus guidelines: White Paper of the ACR Incidental Findings Committee II on Vascular Findings. J Am Coll Radiol 2013; 10:789-794. 6. Cardiomegaly with severe left atrial dilatation. 7. Mild colonic diverticulosis without evidence to suggest an acute diverticulitis at this time. 8. Additional incidental findings, as above. Aortic Atherosclerosis (ICD10-I70.0). Aortic aneurysm NOS (ICD10-I71.9). Electronically Signed   By: Vinnie Langton M.D.   On: 04/18/2017 11:51   Ct Angio Abd/pel W/ And/or W/o  Result Date: 04/18/2017 CLINICAL DATA:  77 year old female with history of severe aortic stenosis. Preprocedural study prior to potential transcatheter aortic valve replacement (TAVR) procedure. EXAM: CT ANGIOGRAPHY CHEST, ABDOMEN AND PELVIS TECHNIQUE: Multidetector CT imaging through the chest, abdomen and pelvis was performed using the standard protocol during bolus administration of intravenous contrast. Multiplanar reconstructed images and MIPs were obtained and reviewed to evaluate the vascular anatomy. CONTRAST:  128mL ISOVUE-370 IOPAMIDOL (ISOVUE-370) INJECTION 76% COMPARISON:  CT the abdomen and pelvis 04/15/2011. No prior chest CT. FINDINGS: CTA  CHEST FINDINGS Cardiovascular: Heart size is enlarged with severe left atrial dilatation. There is no significant pericardial fluid, thickening or pericardial calcification. There is aortic atherosclerosis, as well as atherosclerosis of the great vessels of the mediastinum and the coronary arteries, including calcified atherosclerotic plaque in the left anterior descending, left circumflex and right coronary arteries. Status post median sternotomy for CABG. Severe thickening calcification of the aortic valve. Mild calcification of the mitral annulus. Mediastinum/Lymph Nodes: No pathologically enlarged mediastinal or hilar lymph nodes. Small hiatal hernia. Esophagus is otherwise unremarkable in appearance. No axillary lymphadenopathy. Lungs/Pleura: No suspicious appearing pulmonary nodules or masses. No acute consolidative airspace disease. No pleural effusions. Musculoskeletal/Soft Tissues: Median sternotomy wires. Old sternal fracture with what appears to be fibrous union and posttraumatic deformity. There are no aggressive appearing lytic or blastic lesions noted in the visualized portions of the skeleton. CTA ABDOMEN AND PELVIS FINDINGS Hepatobiliary: No suspicious cystic or solid hepatic lesions. No intra or extrahepatic biliary ductal dilatation. Status post cholecystectomy. Pancreas: No pancreatic mass. No pancreatic ductal dilatation. No pancreatic or peripancreatic fluid or inflammatory changes. Spleen: Unremarkable. Adrenals/Urinary Tract: 3.0 x 4.1 x 3.1 cm enhancing mass in the interpolar region of the right kidney (axial image 100 of series 15 and coronal image 67 of series 16). This lesion is new compared to prior study from 04/15/2011, does not extend into the right renal vein (which is widely patent), and is completely encapsulated within Gerota's fascia.  Several subcentimeter low-attenuation lesions in both kidneys, too small to definitively characterize, but favored to represent tiny cysts. No  hydroureteronephrosis. Urinary bladder is normal in appearance. Bilateral adrenal glands are normal in appearance. 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 No pathologic dilatation of small bowel or colon. Numerous colonic diverticulae are noted, particularly in the region of the sigmoid colon, without surrounding inflammatory changes to suggest an acute diverticulitis at this time. The appendix is not confidently identified and may be surgically absent. Regardless, there are no inflammatory changes noted adjacent to the cecum to suggest the presence of an acute appendicitis at this time. Vascular/Lymphatic: Aortic atherosclerosis with fusiform aneurysmal dilatation of the infrarenal abdominal aorta which measures up to 3.4 x 3.0 cm (axial image 113 of series 15). 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 No lymphadenopathy noted in the abdomen or pelvis. Reproductive: Uterus and ovaries are a trophic and otherwise unremarkable in appearance. Other: No significant volume of ascites.  No pneumoperitoneum. Musculoskeletal: Chronic appearing compression fracture of L1 with 20% loss of anterior vertebral body height. There are no aggressive appearing lytic or blastic lesions noted in the visualized portions of the skeleton. VASCULAR MEASUREMENTS PERTINENT TO TAVR: AORTA: Minimal Aortic Diameter-11 x 10 mm Severity of Aortic Calcification-severe RIGHT PELVIS: Not assessed because there is complete occlusion of the right common iliac artery (there is distal reconstitution of flow secondary to collateralization). LEFT PELVIS: Left Common Iliac Artery - Minimal Diameter-6.7 x 5.5 mm Tortuosity-mild Calcification-moderate Left  External Iliac Artery - Minimal Diameter-6.2 x 6.1 mm Tortuosity-mild Calcification-mild-to-moderate Left Common Femoral Artery - Minimal Diameter-7.2 x 7.3 mm Tortuosity-mild Calcification-moderate Review of the MIP images confirms the above findings. IMPRESSION: 1. Vascular findings and measurements pertinent to potential TAVR procedure, as detailed above. 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 2. Severe thickening calcification of the aortic valve, compatible with the reported clinical history of severe aortic stenosis. 3. New large right renal mass measuring 3.0 x 4.1 x 3.1 cm highly concerning for renal cell carcinoma. This is completely encapsulated within Gerota's fascia, does not involve the right renal vein, and is not associated with local lymphadenopathy or definite findings to suggest metastatic disease in the chest, abdomen or pelvis. Urologic consultation is recommended in the near future for further evaluation. 4. Aortic atherosclerosis, in addition to left main and 3 vessel coronary artery disease. Status post median sternotomy for CABG. 5. In addition, there is an infrarenal abdominal aortic aneurysm measuring up to 3.4 x 3.0 cm (mean diameter of 3.2 cm). Recommend followup by ultrasound in 3 years. This recommendation follows ACR consensus guidelines: White Paper of the ACR Incidental Findings Committee II on Vascular Findings. J Am Coll Radiol 2013; 10:789-794. 6. Cardiomegaly with severe left atrial dilatation. 7. Mild colonic diverticulosis without evidence to suggest an acute diverticulitis at this time. 8. Additional incidental findings, as above. Aortic Atherosclerosis (ICD10-I70.0). Aortic aneurysm NOS (ICD10-I71.9). Electronically Signed   By: Vinnie Langton M.D.   On: 04/18/2017 11:51   Disposition   Pt is being discharged home today in good condition.  Follow-up Plans & Appointments    Follow-up Information    Eileen Stanford, PA-C  Follow up.   Specialties:  Cardiology, Radiology Why:  Follow  up for TAVR and an echocardiogram on May 23rd at 2:00.  Contact information: [address] [place] [id number] [phone]        Lendon Colonel, NP Follow up.   Specialties:  Nurse Practitioner, Radiology, Cardiology Why:  Cardiology hospital follow up on May 14th at 2:00. Please arrive 15 minutes early for check in.  Contact information: [address] [place] [address] [phone]          Discharge Instructions    Amb Referral to Cardiac Rehabilitation   Complete by:  As directed    Diagnosis:  Valve Replacement   Valve:  Aortic Comment - TAVR   Diet - low sodium heart healthy   Complete by:  As directed    Increase activity slowly   Complete by:  As directed       Discharge Medications   Allergies as of 05/11/2017      Reactions   Chantix [varenicline Tartrate] Hives   Dipyridamole Hives      Medication List    TAKE these medications   acetaminophen 500 MG tablet Commonly known as:  TYLENOL Take 1,000 mg by mouth every 6 (six) hours as needed for moderate pain or headache.   ALPRAZolam 0.25 MG tablet Commonly known as:  XANAX Take 0.25 mg by mouth 3 (three) times daily as needed for anxiety.   amLODipine 10 MG tablet Commonly known as:  NORVASC Take 1 tablet (10 mg total) by mouth daily.   aspirin EC 81 MG tablet Take 81 mg by mouth at bedtime.   calcium elemental as carbonate 400 MG chewable tablet Commonly known as:  BARIATRIC TUMS ULTRA Chew 1,000 mg by mouth 3 (three) times daily as needed for heartburn.   chlorthalidone 25 MG tablet Commonly known as:  HYGROTON Take 1/2 tablet ( 12.5 mg ) daily What changed:    how much to take  how to take this  when to take this  additional instructions   clopidogrel 75 MG tablet Commonly known as:  PLAVIX Take 1 tablet (75 mg total) by mouth daily. Start taking on:  05/12/2017   FLUoxetine 40  MG capsule Commonly known as:  PROZAC TAKE ONE CAPSULE BY MOUTH EVERY DAY What changed:    how much to take  how to take this  when to take this   furosemide 20 MG tablet Commonly known as:  LASIX TAKE 1 TABLET BY MOUTH EVERY DAY AS NEEDED FOR EDEMA What changed:  See the new instructions.   irbesartan 300 MG tablet Commonly known as:  AVAPRO TAKE 1 TABLET BY MOUTH EVERY DAY   levothyroxine 50 MCG tablet Commonly known as:  SYNTHROID, LEVOTHROID TAKE 1 TABLET BY MOUTH EVERY DAY BEFORE BREAKFAST TAKE AN EXTRA 1/2 TAB ONE TIME EACH WEEK What changed:  See the new instructions.   loperamide 2 MG capsule Commonly known as:  IMODIUM Take 2-4 mg by mouth 4 (four) times daily as needed for diarrhea or loose stools.   Nebivolol HCl 20 MG Tabs Commonly known as:  BYSTOLIC Take 1 tablet (20 mg total) by mouth daily. What changed:  when to take this   nitroGLYCERIN 0.4 MG SL tablet Commonly known as:  NITROSTAT Place 0.4 mg under the tongue every 5 (five) minutes as needed for chest pain (MAX 3 TABLETS).   pantoprazole 40 MG tablet Commonly known as:  PROTONIX Take 1 tablet (40 mg total) by mouth daily.   PROAIR HFA  108 (90 Base) MCG/ACT inhaler Generic drug:  albuterol Inhale 2 puffs into the lungs every 4 (four) hours as needed for shortness of breath or wheezing.   simethicone 125 MG chewable tablet Commonly known as:  MYLICON Chew 809 mg by mouth every 6 (six) hours as needed for flatulence.   simvastatin 40 MG tablet Commonly known as:  ZOCOR TAKE 1 TABLET OP AT BEDTIME What changed:  See the new instructions.   SYMBICORT 160-4.5 MCG/ACT inhaler Generic drug:  budesonide-formoterol Inhale 1 puff into the lungs 2 (two) times daily as needed (for respiratory issues.).        Outstanding Labs/Studies   none  Duration of Discharge Encounter   Greater than 30 minutes including physician time.  Signed, Luna Fuse, NP 05/11/2017, 3:30  PM

## 2017-05-11 NOTE — Discharge Instructions (Signed)
ACTIVITY AND EXERCISE °• Daily activity and exercise are an important part of your recovery. People recover at different rates depending on their general health and type of valve procedure. °• Most people require six to 10 weeks to feel recovered. °• No lifting, pushing, pulling more than 10 pounds (examples to avoid: groceries, vacuuming, gardening, golfing): °            - For one week with a procedure through the groin. °            - For six weeks for procedures through the chest wall. °            - For three months for procedures through the breast-bone. °NOTE: You will typically see one of our providers 7-10 days after your procedure to discuss WHEN TO RESUME the above activities.  °  °  °DRIVING °• Do not drive for four weeks after the date of your procedure. °• If you have been told by your doctor in the past that you may not drive, you must talk with him/her before you begin driving again. °• When you resume driving, you must have someone with you. °  °  °DRESSING °• Groin site: you may leave the clear dressing over the site for up to one week or until it falls off. °  °  °HYGIENE °• If you had a femoral (leg) procedure, you may take a shower when you return home. After the shower, pat the site dry. Do NOT use powder, oils or lotions in your groin area until the site has completely healed. °• If you had a chest procedure, you may shower when you return home unless specifically instructed not to by your discharging practitioner. °            - DO NOT scrub incision; pat dry with a towel °            - DO NOT apply any lotions, oils, powders to the incision °            - No tub baths / swimming for at least six weeks. °• If you notice any fevers, chills, increased pain, swelling, bleeding or pus, please contact your doctor. °  °ADDITIONAL INFORMATION °• If you are going to have an upcoming dental procedure, please contact our office as you may require antibiotics ahead of time to prevent infection on your  heart valve.  °  °

## 2017-05-12 ENCOUNTER — Telehealth: Payer: Self-pay

## 2017-05-12 NOTE — Telephone Encounter (Signed)
Patient contacted regarding discharge from [facility] on 05/11/2017.  Patient understands to follow up with provider Jory Sims NP on 05/23/2017 at 2:00 PM at [facility] office. Patient understands discharge instructions? yes Patient understands medications and regiment? yes Patient understands to bring all medications to this visit? yes  The pt is doing well at this time.  She is also aware of the scheduled follow-up appointments with Echo and Nell Range PA-C on 06/01/2017.

## 2017-05-15 ENCOUNTER — Telehealth (HOSPITAL_COMMUNITY): Payer: Self-pay

## 2017-05-15 NOTE — Telephone Encounter (Signed)
Attempted to call patient to see if she is interested in the Cardiac Rehab Program - lm on vm °

## 2017-05-15 NOTE — Telephone Encounter (Signed)
Patients insurance is active and benefits verified through [facility] - $20.00 co-pay, no deductible, out of pocket amount of $4,400/$514.91 has been met, no co-insurance, and no pre-authorization is required. Passport/reference [id number]  Will contact patient to see if she is interested in the Cardiac Rehab Program. If interested, patient will need to complete follow up appt. Once completed, patient will be contacted for scheduling upon review by the RN Navigator.

## 2017-05-16 ENCOUNTER — Telehealth (HOSPITAL_COMMUNITY): Payer: Self-pay

## 2017-05-16 NOTE — Telephone Encounter (Signed)
Patient returned phone call yesterday afternoon. Returned phone call - lm on vm

## 2017-05-16 NOTE — Telephone Encounter (Signed)
Patient returned phone call and stated she is interested in the Cardiac Rehab Department. Patient stated she has Mass on Kidney. Patient has appt today 05/16/17 at 9:30 with Dr.Borden at [facility]. Will continue to follow.

## 2017-05-17 ENCOUNTER — Other Ambulatory Visit: Payer: Self-pay | Admitting: Cardiology

## 2017-05-17 NOTE — Telephone Encounter (Signed)
Rx(s) sent to pharmacy electronically.  

## 2017-05-18 ENCOUNTER — Telehealth: Payer: Self-pay | Admitting: Adult Health

## 2017-05-18 NOTE — Telephone Encounter (Signed)
Received records from [facility] on 05/18/17, Appt 05/23/17 @ 2:00pm W/ [place]. [place]

## 2017-05-22 NOTE — Progress Notes (Signed)
Cardiology Office Note   Date:  05/23/2017   ID:  Mindy, [name] 05-01-1940, MRN [id number]  PCP:  [name], MD  Cardiologist:  Dr. Martinique TAVR: Burt Knack    History of Present Illness: [name] is a 77 y.o. female who presents for post hospitalization follow up after admission for TAVR. 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 She had had moderate aortic valve stenosis, with worsening symptoms.  Cardiac cath performed 04/04/2017 revealed peak to peak gradient of 36 mmHg and a mean gradient of 32 mmHg and severe 3 vessel CAD with continued patency of LIMA to mid LAD, free RIMA to the distal LAD, and sequential SVG to the first and second OM of the Cx.   TAVR was completed with a 26 mm Sapien 3 with no significant peri-valvular regurgitation on 05/09/2017. Post procedure she did well with mild fluid overload. She was treated with IV lasix and was discharged on prn lasix at home. Chlorthalidone and Avapro were restarted on discharge as well on 05/11/2017.   She is very grateful to Dr. Burt Knack and Dr. Ricard Dillon for their care during her hospitalization and procedure. She is feeling very well and is breathing much better, beginning to get some energy back. She is medically compliant and denies new symptoms.   Past Medical History:  Diagnosis Date  . Abdominal aortic aneurysm ([place])    infrarenal diagnosed with maximum measurement at 3.3 cm  . Depression   . Diverticulosis   . GERD (gastroesophageal reflux disease)   . History of tobacco abuse    Recurrent cigarette smoking  . Hyperlipidemia   . Hyperplastic colon polyp   . Hypertension   . Hypothyroidism   . Ischemic heart disease    had redo bypass surgery in 2001, at that time she had a free right internal mammary to the LAD, LAD endarterectomy,  sequential vein graft to the OM1 and OM2 -- last catheterization was in 2008  . Leg fracture    remote right leg fracture  . Microscopic colitis   . Obesity    with gastric bypass surgery in 2004  . PAD (peripheral artery disease) ([facility])    right iliac occlusion  . PONV (postoperative nausea and vomiting)   . Right kidney mass   . S/P CABG x 4 1985   LIMA to LAD, Sequential SVG to OM1-OM2, SVG to RCA - Dr Redmond Pulling  . S/P redo CABG x 3 2001   Free RIMA to LAD with endarterectomy, sequential SVG to OM1-OM2 - Dr Servando Snare  . 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 ARTERIAL LINE INSERTION Right 05/09/2017   Procedure: ARTERIAL LINE  INSERTION  RIGHT RADIAL ARTERY;  Surgeon: Sherren Mocha, MD;  Location: [place];  Service: Open Heart Surgery;  Laterality: Right;  . CARDIAC CATHETERIZATION  02/16/1999   Left heart catheterization with selective coronary  angiography, left ventricular angiography (RAO and LAO views), angiography of left internal mammary artery, and angiography of saphenous vein grafts  . CARDIAC CATHETERIZATION  10/20/2006   Est. EF of 55% -- Totally occuladed native coronary circulation -- Persistent patency of the right mammary graft  and the distal LAD artery with peristent patency of the left mammary graft to the proximal LAD artery with persistant patency of the saphenous vein graft to the sequentail branches of the OM        . CARPAL TUNNEL RELEASE    . CHOLECYSTECTOMY    . COLONOSCOPY  07/12/2007   Dr.Brodie- normal cecum and rectum, diverticulosis in the sigmoid colon  . COLONOSCOPY  1991   per Dr.Brodie office visit note= hyperplastic polyp  . COLONOSCOPY N/A 09/30/2014   Dr.Rourk- noraml appearing rectal mucosa, scattered L sided diverticula, colonic mucosa has a somewhat pale friable appearence  diffusely, no ulcers or erosions seen, the distal 10cm of terminal ileum appeared entirely normal. bx=benign colorectal mucosa with lymphocytic colitis  . CORONARY ARTERY BYPASS GRAFT  1985  . CORONARY ARTERY BYPASS GRAFT  03/09/1999   Redo CABG x3 --  with the free right internal mammary artey to the LAD with an LAD endarterectomy -- Sequential saphenous vein graft to OM1 and OM2                   . ESOPHAGOGASTRODUODENOSCOPY N/A 09/30/2014   Dr.Rourk-mild erosive reflux esophagitis s/p bariatric surgery  . EYE SURGERY     catracts  . GASTRIC BYPASS  2004  . RIGHT/LEFT HEART CATH AND CORONARY/GRAFT ANGIOGRAPHY N/A 04/04/2017   Procedure: RIGHT/LEFT HEART CATH AND CORONARY/GRAFT ANGIOGRAPHY;  Surgeon: Martinique, Peter M, MD;  Location: [place] [facility];  Service: Cardiovascular;  Laterality: N/A;  . TEE WITHOUT CARDIOVERSION N/A 05/09/2017   Procedure: TRANSESOPHAGEAL ECHOCARDIOGRAM (TEE);  Surgeon: Sherren Mocha, MD;  Location: [place];  Service: Open Heart Surgery;  Laterality: N/A;  . TRANSCATHETER AORTIC VALVE REPLACEMENT, TRANSFEMORAL N/A 05/09/2017   Procedure: TRANSCATHETER AORTIC VALVE REPLACEMENT, TRANSFEMORAL using a 56mm Edwards Sapien 3 Aortic Valve;  Surgeon: Sherren Mocha, MD;  Location: [place];  Service: Open Heart Surgery;  Laterality: N/A;     Current Outpatient Medications  Medication Sig Dispense Refill  . acetaminophen (TYLENOL) 500 MG tablet Take 1,000 mg by mouth every 6 (six) hours as needed for moderate pain or headache.     . ALPRAZolam (XANAX) 0.25 MG tablet Take 0.25 mg by mouth 3 (three) times daily as needed for anxiety.  0  . amLODipine (NORVASC) 10 MG tablet TAKE 1 TABLET BY MOUTH EVERY DAY 30 tablet 10  . aspirin EC 81 MG tablet Take 81 mg by mouth at bedtime.     . calcium elemental as carbonate (BARIATRIC TUMS ULTRA) 400 MG chewable tablet Chew 1,000 mg by mouth 3 (three) times daily as needed for heartburn.    . chlorthalidone (HYGROTON) 25 MG tablet Take 1/2  tablet ( 12.5 mg ) daily (Patient taking differently: Take 12.5 mg by mouth daily. ) 30 tablet 6  . clopidogrel (PLAVIX) 75 MG tablet Take 1 tablet (75 mg total) by mouth daily. 30 tablet 5  . FLUoxetine (PROZAC) 40 MG capsule TAKE ONE CAPSULE BY MOUTH EVERY DAY (Patient taking differently: Take 40 mg by mouth in the evening) 90 capsule 1  . furosemide (LASIX) 20 MG tablet TAKE 1 TABLET BY MOUTH EVERY DAY AS NEEDED FOR EDEMA (Patient taking differently: TAKE 20 MG BY MOUTH EVERY DAY AS NEEDED FOR EDEMA) 30 tablet 11  . irbesartan (AVAPRO) 300 MG tablet TAKE 1 TABLET BY MOUTH EVERY DAY 30 tablet 4  . levothyroxine (SYNTHROID, LEVOTHROID) 50 MCG tablet TAKE 1 TABLET BY MOUTH EVERY DAY BEFORE BREAKFAST TAKE AN EXTRA 1/2  TAB ONE TIME EACH WEEK (Patient taking differently: Take 50 mcg by mouth every morning before breakfast) 96 tablet 4  . loperamide (IMODIUM) 2 MG capsule Take 2-4 mg by mouth 4 (four) times daily as needed for diarrhea or loose stools.     . Nebivolol HCl (BYSTOLIC) 20 MG TABS Take 1 tablet (20 mg total) by mouth daily. (Patient taking differently: Take 20 mg by mouth at bedtime. ) 90 tablet 2  . nitroGLYCERIN (NITROSTAT) 0.4 MG SL tablet Place 0.4 mg under the tongue every 5 (five) minutes as needed for chest pain (MAX 3 TABLETS).    . pantoprazole (PROTONIX) 40 MG tablet Take 1 tablet (40 mg total) by mouth daily. 30 tablet 11  . PROAIR HFA 108 (90 Base) MCG/ACT inhaler Inhale 2 puffs into the lungs every 4 (four) hours as needed for shortness of breath or wheezing.  2  . simethicone (MYLICON) 956 MG chewable tablet Chew 125 mg by mouth every 6 (six) hours as needed for flatulence.    . simvastatin (ZOCOR) 40 MG tablet TAKE 1 TABLET OP AT BEDTIME (Patient taking differently: Take 40 mg by mouth at bedtime) 90 tablet 2  . SYMBICORT 160-4.5 MCG/ACT inhaler Inhale 1 puff into the lungs 2 (two) times daily as needed (for respiratory issues.).   2   No current facility-administered  medications for this visit.     Allergies:   Chantix [varenicline tartrate] and Dipyridamole    Social History:  The patient  reports that she has been smoking cigarettes.  She started smoking about 32 years ago. She has been smoking about 0.50 packs per day. She has never used smokeless tobacco. She reports that she does not drink alcohol or use drugs.   Family History:  The patient's family history includes Diabetes in her mother; Pancreatic cancer in her mother; Stroke in her father.    ROS: All other systems are reviewed and negative. Unless otherwise mentioned in H&P    PHYSICAL EXAM: VS:  BP 126/76   Pulse 63   Ht 5\' 7"  (1.702 m)   Wt 209 lb 6.4 oz (95 kg)   BMI 32.80 kg/m  , BMI Body mass index is 32.8 kg/m. GEN: Well nourished, well developed, in no acute distress  HEENT: normal  Neck: no JVD, carotid bruits, or masses Cardiac: RRR; no murmurs, rubs, or gallops,no edema  Respiratory:  clear to auscultation bilaterally, normal work of breathing GI: soft, nontender, nondistended, + BS MS: no deformity or atrophy  Skin: warm and dry, no rash Neuro:  Strength and sensation are intact Psych: euthymic mood, full affect   EKG:  NSR, rate of 63 bpm. Evidence of inferior Q wave Lead III.   Recent Labs: 07/07/2016: TSH 3.180 05/05/2017: ALT 24; B Natriuretic Peptide 492.6 05/10/2017: Hemoglobin 11.1; Magnesium 1.6; Platelets 129 05/11/2017: BUN 12; Creatinine, Ser 1.05; Potassium 3.8; Sodium 135    Lipid Panel    Component Value Date/Time   CHOL 149 02/04/2014 0831   TRIG 124 02/04/2014 0831   HDL 60 02/04/2014 0831   CHOLHDL 2.5 02/04/2014 0831   VLDL 25 02/04/2014 0831   LDLCALC 64 02/04/2014 0831      Wt Readings from Last 3 Encounters:  05/23/17 209 lb 6.4 oz (95 kg)  05/09/17 213 lb 13.5 oz (97 kg)  05/05/17 214 lb 14.4 oz (97.5 kg)      Other studies Reviewed: 05/10/17 2D echocardiogram (postop day #1 study): Left ventricle: Wall thickness was increased  in a pattern of mild LVH. Systolic function was normal. The estimated ejection fraction was in the range of 60% to 65%. The transmitral flow pattern was normal. The deceleration time of the early transmitral flow velocity was normal. The pulmonary vein flow pattern was normal. The tissue Doppler parameters were normal. Left ventricular diastolic function parameters were normal. Doppler parameters are consistent with both elevated ventricular end-diastolic filling pressure and elevated left atrial filling pressure.  05/09/2017 Transcatheter Aortic Valve Replacement - PercutaneousLeftTransfemoral Approach Edwards Sapien 3 THV (size 18mm, model # 9600TFX, serial # [id number])  Pre-operative Echo Findings: ? Severe aortic stenosis ? Moderate aortic insufficiency ? Normalleft ventricular systolic function  Post-operative Echo Findings: ? Noparavalvular leak ? Normalleft ventricular systolic function  Cardiac Cath 04/04/2017  Conclusion     Ost LAD to Prox LAD lesion is 100% stenosed.  Prox LAD to Mid LAD lesion is 100% stenosed.  Ost Cx to Mid Cx lesion is 100% stenosed.  Prox RCA to Mid RCA lesion is 100% stenosed.  LIMA graft was visualized by angiography and is normal in caliber.  The graft exhibits no disease.  Seq SVG- OM 1 and OM 2 graft was visualized by angiography and is large.  SVG graft was visualized by angiography and is normal in caliber.  The graft exhibits no disease.  Dist Graft lesion is 30% stenosed.  Origin lesion is 35% stenosed.  There is severe aortic valve stenosis.  LV end diastolic pressure is mildly elevated.  Hemodynamic findings consistent with mild pulmonary hypertension.   1. Severe 3 vessel occlusive CAD 2. Patent LIMA to the mid LAD. This only supplies a septal perforator.  3. Patent SVG to mid to distal LAD 4. Patent sequential SVG to OM 1 and OM 2 5. Severe Aortic stenosis. Mean gradient 32 mm Hg, peak  gradient 35 mm Hg. Valve area 1.1 cm squared. Index 0.56 6. Normal cardiac output 7. Mild pulmonary HTN with mildly elevated LV filling pressures.   Plan: Coronary circulation is unchanged from 2008. Patient has progressive symptomatic aortic stenosis. Will refer for TAVR evaluation.     ASSESSMENT AND PLAN:  1.  Aortic Valve Stenosis:  S/P Sapien AoV repair TAVR. She is feeling very well and is asymptomatic. She is very grateful to the physicians who treated her during her hospitalization. Would recommend cardiac rehab if ok with TAVR clinic on follow up for ongoing education, support and motivation to continue lifestyle management. SBE prophylaxis required.   2. AAA:  Infrarenal abdominal aorta which measures up to 3.4 x 3.0 cm noted on CT 04/18/2017. Continue to follow. May refer to [facility] for establishment.   3. CAD: Hx of 5 vessel CABG (1995) and 3 V (2001). No complaints of chest pain. Patent grafts per cardiac cath 04/04/2017. Continue medical management with secondary prevention.   4. Hypercholesterolemia: Continue simvastatin. Labs per PCP  5. Right Kidney Neoplasm: Noted incidentally on CT of the abdomen during AAA evaluation. Measured at 4.1 in the interolar right kidney raising suspicion of malignancy without evidence of metastatic disease. Followed by renal. Will wait for biopsy after recent TAVR. Will have follow up CT in 3 months   Current medicines are reviewed at length with the patient today.    Labs/ tests ordered today include:   Phill Myron. West Pugh, ANP, AACC   05/23/2017 4:44 PM    [facility] [address], [place], [place] [address] Phone: [phone]; Fax: [phone]

## 2017-05-23 ENCOUNTER — Encounter: Payer: Self-pay | Admitting: Adult Health

## 2017-05-23 ENCOUNTER — Ambulatory Visit (INDEPENDENT_AMBULATORY_CARE_PROVIDER_SITE_OTHER): Payer: Medicare Other | Admitting: Adult Health

## 2017-05-23 VITALS — BP 126/76 | HR 63 | Ht 67.0 in | Wt 209.4 lb

## 2017-05-23 DIAGNOSIS — C641 Malignant neoplasm of right kidney, except renal pelvis: Secondary | ICD-10-CM | POA: Diagnosis not present

## 2017-05-23 DIAGNOSIS — E7800 Pure hypercholesterolemia, unspecified: Secondary | ICD-10-CM

## 2017-05-23 DIAGNOSIS — Z952 Presence of prosthetic heart valve: Secondary | ICD-10-CM

## 2017-05-23 DIAGNOSIS — I10 Essential (primary) hypertension: Secondary | ICD-10-CM

## 2017-05-23 DIAGNOSIS — I350 Nonrheumatic aortic (valve) stenosis: Secondary | ICD-10-CM | POA: Diagnosis not present

## 2017-05-23 DIAGNOSIS — I2510 Atherosclerotic heart disease of native coronary artery without angina pectoris: Secondary | ICD-10-CM

## 2017-05-23 NOTE — Patient Instructions (Signed)
Continue same medications   Keep appointments as planned 

## 2017-05-31 NOTE — Progress Notes (Signed)
[facility] [place]                                       Cardiology Office Note    Date:  06/01/2017   ID:  [name], DOB 1940-03-18, MRN [id number]  PCP:  [name], MD  Cardiologist:  Dr. Martinique / Dr. Burt Knack & Dr. Roxy Manns (TAVR)  CC: 1 month follow up s/p TAVR  History of Present Illness:  [name] is a 76 y.o. female with a history of HTN, CAD s/p CABG x5V (1985) with redo CABG x3V (2001), HTN, PAD, longstanding tobacco abuse, AAA, hypothyroidism and severe AS s/p TAVR who presents to clinic for follow up.  [name] developed heart murmur on physical exam and was noted to have aortic stenosis which has gradually progressed in severity over the last several years. Over the past year or so she has experienced significant progression of symptoms of exertional shortness of breath and fatigue. Follow-up echocardiogram performed March 29, 2017 revealed normal left ventricular systolic function with severe aortic stenosis. Peak velocity across the aortic valve measured 4.1 m/s corresponding to mean transvalvular gradient estimated 39 mmHg. The DVI was reported 0.20 with aortic valve area calculated 0.69 cm. Left ventricular ejection fraction was estimated 55-60%. Cardiac cath performed 04/04/2017 revealed peak to peak gradient of 36 mmHg and a mean gradient of 32 mmHg and severe 3 vessel CAD with continued patency of LIMA to mid LAD, free RIMA to the distal LAD, and sequential SVG to the first and second OM of the Cx.   She underwent successful TAVR with a 26 mm Sapien 3 on 05/09/2017. Post operative echo showed a normally functioning TAVR with no significant PVL; mean gradient 11 mm Hg. She was discharged on ASA and plavix.   Today she presents to clinic for follow up. She is doing well and can tell a big difference (increase) in her energy since her TAVR. She hasn't been very physically active since her surgery. She did go out to  [place] and got really tired. Going up a flight of stairs would cause her significant SOB. She is going to participate in cardiac rehab. No CP. No LE edema, orthopnea or PND. No dizziness or syncope. No blood in stool or urine. No palpitations. She is trying to quit smoking with nicotine gum. She saw Dr. Loletha Grayer about her kidney lesion and plan is for nephrectomy after she completes 6 months of plavix.   Past Medical History:  Diagnosis Date  . Abdominal aortic aneurysm ([place])    infrarenal diagnosed with maximum measurement at 3.3 cm  . Depression   . Diverticulosis   . GERD (gastroesophageal reflux disease)   . History of tobacco abuse    Recurrent cigarette smoking  . Hyperlipidemia   . Hyperplastic colon polyp   . Hypertension   . Hypothyroidism   . Ischemic heart disease    had redo bypass surgery in 2001, at that time she had a free right internal mammary to the LAD, LAD endarterectomy, sequential vein graft to the OM1 and OM2 -- last catheterization was in 2008  . Leg fracture    remote right leg fracture  . Microscopic colitis   . Obesity    with gastric bypass surgery in 2004  . PAD (peripheral artery disease) ([facility])    right iliac occlusion  .  PONV (postoperative nausea and vomiting)   . Right kidney mass   . S/P CABG x 4 1985   LIMA to LAD, Sequential SVG to OM1-OM2, SVG to RCA - Dr Redmond Pulling  . S/P redo CABG x 3 2001   Free RIMA to LAD with endarterectomy, sequential SVG to OM1-OM2 - Dr Servando Snare  . 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 ARTERIAL LINE INSERTION Right 05/09/2017   Procedure: ARTERIAL LINE  INSERTION  RIGHT RADIAL ARTERY;  Surgeon: Sherren Mocha, MD;  Location: [facility];  Service: Open Heart Surgery;  Laterality: Right;  . CARDIAC CATHETERIZATION  02/16/1999   Left heart  catheterization with selective coronary  angiography, left ventricular angiography (RAO and LAO views), angiography of left internal mammary artery, and angiography of saphenous vein grafts  . CARDIAC CATHETERIZATION  10/20/2006   Est. EF of 55% -- Totally occuladed native coronary circulation -- Persistent patency of the right mammary graft and the distal LAD artery with peristent patency of the left mammary graft to the proximal LAD artery with persistant patency of the saphenous vein graft to the sequentail branches of the OM        . CARPAL TUNNEL RELEASE    . CHOLECYSTECTOMY    . COLONOSCOPY  07/12/2007   Dr.Brodie- normal cecum and rectum, diverticulosis in the sigmoid colon  . COLONOSCOPY  1991   per Dr.Brodie office visit note= hyperplastic polyp  . COLONOSCOPY N/A 09/30/2014   Dr.Rourk- noraml appearing rectal mucosa, scattered L sided diverticula, colonic mucosa has a somewhat pale friable appearence diffusely, no ulcers or erosions seen, the distal 10cm of terminal ileum appeared entirely normal. bx=benign colorectal mucosa with lymphocytic colitis  . CORONARY ARTERY BYPASS GRAFT  1985  . CORONARY ARTERY BYPASS GRAFT  03/09/1999   Redo CABG x3 --  with the free right internal mammary artey to the LAD with an LAD endarterectomy -- Sequential saphenous vein graft to OM1 and OM2                   . ESOPHAGOGASTRODUODENOSCOPY N/A 09/30/2014   Dr.Rourk-mild erosive reflux esophagitis s/p bariatric surgery  . EYE SURGERY     catracts  . GASTRIC BYPASS  2004  . RIGHT/LEFT HEART CATH AND CORONARY/GRAFT ANGIOGRAPHY N/A 04/04/2017   Procedure: RIGHT/LEFT HEART CATH AND CORONARY/GRAFT ANGIOGRAPHY;  Surgeon: Martinique, Peter M, MD;  Location: [facility];  Service: Cardiovascular;  Laterality: N/A;  . TEE WITHOUT CARDIOVERSION N/A 05/09/2017   Procedure: TRANSESOPHAGEAL ECHOCARDIOGRAM (TEE);  Surgeon: Sherren Mocha, MD;  Location: [facility];  Service: Open Heart Surgery;  Laterality: N/A;  .  TRANSCATHETER AORTIC VALVE REPLACEMENT, TRANSFEMORAL N/A 05/09/2017   Procedure: TRANSCATHETER AORTIC VALVE REPLACEMENT, TRANSFEMORAL using a 32mm Edwards Sapien 3 Aortic Valve;  Surgeon: Sherren Mocha, MD;  Location: [facility];  Service: Open Heart Surgery;  Laterality: N/A;    Current Medications: Outpatient Medications Prior to Visit  Medication Sig Dispense Refill  . acetaminophen (TYLENOL) 500 MG tablet Take 1,000 mg by mouth every 6 (six) hours as needed for moderate pain or headache.     . ALPRAZolam (XANAX) 0.25 MG tablet Take 0.25 mg by mouth 3 (three) times daily as needed for anxiety.  0  . amLODipine (NORVASC) 10 MG tablet TAKE 1 TABLET BY MOUTH EVERY DAY 30 tablet 10  .  aspirin EC 81 MG tablet Take 81 mg by mouth at bedtime.     . calcium elemental as carbonate (BARIATRIC TUMS ULTRA) 400 MG chewable tablet Chew 1,000 mg by mouth 3 (three) times daily as needed for heartburn.    . chlorthalidone (HYGROTON) 25 MG tablet Take 12.5 mg by mouth daily.    . clopidogrel (PLAVIX) 75 MG tablet Take 1 tablet (75 mg total) by mouth daily. 30 tablet 5  . FLUoxetine (PROZAC) 40 MG capsule Take 40 mg by mouth every evening.    . furosemide (LASIX) 20 MG tablet Take 20 mg by mouth daily as needed for edema.    . irbesartan (AVAPRO) 300 MG tablet TAKE 1 TABLET BY MOUTH EVERY DAY 30 tablet 4  . levothyroxine (SYNTHROID, LEVOTHROID) 50 MCG tablet Take 50 mcg by mouth daily before breakfast.    . loperamide (IMODIUM) 2 MG capsule Take 2-4 mg by mouth 4 (four) times daily as needed for diarrhea or loose stools.     . Nebivolol HCl (BYSTOLIC) 20 MG TABS Take 20 mg by mouth at bedtime.    . nitroGLYCERIN (NITROSTAT) 0.4 MG SL tablet Place 0.4 mg under the tongue every 5 (five) minutes as needed for chest pain (MAX 3 TABLETS).    . pantoprazole (PROTONIX) 40 MG tablet Take 1 tablet (40 mg total) by mouth daily. 30 tablet 11  . PROAIR HFA 108 (90 Base) MCG/ACT inhaler Inhale 2 puffs into the lungs every 4  (four) hours as needed for shortness of breath or wheezing.  2  . simethicone (MYLICON) 379 MG chewable tablet Chew 125 mg by mouth every 6 (six) hours as needed for flatulence.    . simvastatin (ZOCOR) 40 MG tablet Take 40 mg by mouth at bedtime.    . SYMBICORT 160-4.5 MCG/ACT inhaler Inhale 1 puff into the lungs 2 (two) times daily as needed (for respiratory issues.).   2  . chlorthalidone (HYGROTON) 25 MG tablet Take 1/2 tablet ( 12.5 mg ) daily (Patient not taking: Reported on 06/01/2017) 30 tablet 6  . FLUoxetine (PROZAC) 40 MG capsule TAKE ONE CAPSULE BY MOUTH EVERY DAY (Patient not taking: Reported on 06/01/2017) 90 capsule 1  . furosemide (LASIX) 20 MG tablet TAKE 1 TABLET BY MOUTH EVERY DAY AS NEEDED FOR EDEMA (Patient not taking: Reported on 06/01/2017) 30 tablet 11  . levothyroxine (SYNTHROID, LEVOTHROID) 50 MCG tablet TAKE 1 TABLET BY MOUTH EVERY DAY BEFORE BREAKFAST TAKE AN EXTRA 1/2 TAB ONE TIME EACH WEEK (Patient not taking: Reported on 06/01/2017) 96 tablet 4  . Nebivolol HCl (BYSTOLIC) 20 MG TABS Take 1 tablet (20 mg total) by mouth daily. (Patient not taking: Reported on 06/01/2017) 90 tablet 2  . simvastatin (ZOCOR) 40 MG tablet TAKE 1 TABLET OP AT BEDTIME (Patient not taking: Reported on 06/01/2017) 90 tablet 2   No facility-administered medications prior to visit.      Allergies:   Chantix [varenicline tartrate]; Dipyridamole; and Varenicline   Social History   Socioeconomic History  . Marital status: Married    Spouse name: Not on file  . Number of children: Not on file  . Years of education: Not on file  . Highest education level: Not on file  Occupational History  . Occupation: reitred    Fish farm manager: [employer]  Social Needs  . Financial resource strain: Not on file  . Food insecurity:    Worry: Not on file    Inability: Not on file  .  Transportation needs:    Medical: Not on file    Non-medical: Not on file  Tobacco Use  . Smoking status: Current  Every Day Smoker    Packs/day: 0.50    Types: Cigarettes    Start date: 01/10/1985    Last attempt to quit: 02/11/2016    Years since quitting: 1.3  . Smokeless tobacco: Never Used  . Tobacco comment: smokes 1-2 cigarettes a day  Substance and Sexual Activity  . Alcohol use: No    Alcohol/week: 0.0 oz  . Drug use: No  . Sexual activity: Never  Lifestyle  . Physical activity:    Days per week: Not on file    Minutes per session: Not on file  . Stress: Not on file  Relationships  . Social connections:    Talks on phone: Not on file    Gets together: Not on file    Attends religious service: Not on file    Active member of club or organization: Not on file    Attends meetings of clubs or organizations: Not on file    Relationship status: Not on file  Other Topics Concern  . Not on file  Social History Narrative  . Not on file     Family History:  The patient's family history includes Diabetes in her mother; Pancreatic cancer in her mother; Stroke in her father.     ROS:   Please see the history of present illness.    ROS All other systems reviewed and are negative.   PHYSICAL EXAM:   VS:  BP 128/66   Pulse 62   Ht 5\' 7"  (1.702 m)   Wt 209 lb (94.8 kg) Comment: pt refused to weigh-pt stated weight  SpO2 90%   BMI 32.73 kg/m    GEN: Well nourished, well developed, in no acute distress, obese HEENT: normal  Neck: no JVD, carotid bruits, or masses Cardiac: RRR; no murmurs, rubs, or gallops,no edema  Respiratory:  clear to auscultation bilaterally, normal work of breathing GI: soft, nontender, nondistended, + BS Mindy: no deformity or atrophy  Skin: warm and dry, no rash Neuro:  Alert and Oriented x 3, Strength and sensation are intact Psych: euthymic mood, full affect   Wt Readings from Last 3 Encounters:  06/01/17 209 lb (94.8 kg)  05/23/17 209 lb 6.4 oz (95 kg)  05/09/17 213 lb 13.5 oz (97 kg)      Studies/Labs Reviewed:   EKG:  EKG is NOT ordered today.     Recent Labs: 07/07/2016: TSH 3.180 05/05/2017: ALT 24; B Natriuretic Peptide 492.6 05/10/2017: Hemoglobin 11.1; Magnesium 1.6; Platelets 129 05/11/2017: BUN 12; Creatinine, Ser 1.05; Potassium 3.8; Sodium 135   Lipid Panel    Component Value Date/Time   CHOL 149 02/04/2014 0831   TRIG 124 02/04/2014 0831   HDL 60 02/04/2014 0831   CHOLHDL 2.5 02/04/2014 0831   VLDL 25 02/04/2014 0831   LDLCALC 64 02/04/2014 0831    Additional studies/ records that were reviewed today include:   05/09/2017 Transcatheter Aortic Valve Replacement - PercutaneousLeftTransfemoral Approach Edwards Sapien 3 THV (size 81mm, model # 9600TFX, serial # [id number])  Pre-operative Echo Findings: ? Severe aortic stenosis ? Moderate aortic insufficiency ? Normalleft ventricular systolic function  Post-operative Echo Findings: ? Noparavalvular leak ? Normalleft ventricular systolic function   __________________   05/10/17 2D echocardiogram (postop day #1 study): Left ventricle: Wall thickness was increased in a pattern of mild LVH. Systolic function was normal. The estimated ejection  fraction was in the range of 60% to 65%. The transmitral flow pattern was normal. The deceleration time of the early transmitral flow velocity was normal. The pulmonary vein flow pattern was normal. The tissue Doppler parameters were normal. Left ventricular diastolic function parameters were normal. Doppler parameters are consistent with both elevated ventricular end-diastolic filling pressure and elevated left atrial filling pressure.   __________________  06/01/17 echo (30 days post op) Study Conclusions - Left ventricle: The cavity size was normal. Wall thickness was   increased in a pattern of mild LVH. Systolic function was normal.   The estimated ejection fraction was in the range of 55% to 60%.   Wall motion was normal; there were no regional wall motion   abnormalities. Doppler parameters  are consistent with abnormal   left ventricular relaxation (grade 1 diastolic dysfunction).   Doppler parameters are consistent with high ventricular filling   pressure. - Aortic valve: A bioprosthesis was present. Valve area (VTI): 1.56   cm^2. - Mitral valve: There was mild regurgitation. - Left atrium: The atrium was mildly dilated. Impressions: - Normal LV systolic function; mild LVH; mild diastolic   dysfunction; s/p AVR with mean gradient 14 mmHg and no AI; mild   MR; mild LAE; mild TR.   ASSESSMENT & PLAN:   Severe AS s/p TAVR: 2D ECHO today shows EF 55%, normally functioning TAVR valve with mean gradient 14 mm HG and no PVL. She has NHYA class II symptoms, but she has had significant improvement in her breathing since her TAVR. Continue ASA and plavix. She can stop plavix after 6 months of therapy. SBE prophylaxis discussed. We have called in Amoxil. I will see her back in 1 year with an echo.   AAA: infrarenal abdominal aorta which measures up to 3.4 x 3.0 cm noted on CT 04/18/2017. Continue to follow.  CAD: continue statin   HTN: BP well controlled today   Right Kidney Neoplasm: noted incidentally on CT of the abdomen during TAVR work up. She saw Dr. Loletha Grayer and they are going to wait 6 months (to finish course of plavix) to do biopsy with plans to do a R nephrectomy in November.    Medication Adjustments/Labs and Tests Ordered: Current medicines are reviewed at length with the patient today.  Concerns regarding medicines are outlined above.  Medication changes, Labs and Tests ordered today are listed in the Patient Instructions below. Patient Instructions  Medication Instructions:  1) you may STOP PLAVIX 11/10/2017 2) We discussed the importance of taking an antibiotic prior to any dental, gastrointestinal, genitourinary procedures to prevent damage to the heart valves from infection. You have been called in a prescription for AMOXIL 2 g to be taken 1 hour prior to dental  visits.   Labwork: None  Testing/Procedures: Your provider has requested that you have an echocardiogram in 1 year. Echocardiography is a painless test that uses sound waves to create images of your heart. It provides your doctor with information about the size and shape of your heart and how well your heart's chambers and valves are working. This procedure takes approximately one hour. There are no restrictions for this procedure.    Follow-Up: Your provider recommends that you schedule a follow-up appointment in 1 year with Nell Range, PA. You will be called to schedule this appointment and your echo.  Any Other Special Instructions Will Be Listed Below (If Applicable).     If you need a refill on your cardiac medications before your next  appointment, please call your pharmacy.      Signed, Angelena Form, PA-C  06/01/2017 5:14 PM    [facility] [address], [place], [place]  [address] Phone: [phone]; Fax: [phone]

## 2017-06-01 ENCOUNTER — Other Ambulatory Visit: Payer: Self-pay

## 2017-06-01 ENCOUNTER — Ambulatory Visit (INDEPENDENT_AMBULATORY_CARE_PROVIDER_SITE_OTHER): Payer: Medicare Other | Admitting: Physician Assistant

## 2017-06-01 ENCOUNTER — Encounter: Payer: Self-pay | Admitting: Physician Assistant

## 2017-06-01 ENCOUNTER — Ambulatory Visit (HOSPITAL_COMMUNITY): Payer: Medicare Other | Attending: Cardiology

## 2017-06-01 VITALS — BP 128/66 | HR 62 | Ht 67.0 in | Wt 209.0 lb

## 2017-06-01 DIAGNOSIS — N289 Disorder of kidney and ureter, unspecified: Secondary | ICD-10-CM | POA: Diagnosis not present

## 2017-06-01 DIAGNOSIS — I25810 Atherosclerosis of coronary artery bypass graft(s) without angina pectoris: Secondary | ICD-10-CM

## 2017-06-01 DIAGNOSIS — I081 Rheumatic disorders of both mitral and tricuspid valves: Secondary | ICD-10-CM | POA: Insufficient documentation

## 2017-06-01 DIAGNOSIS — I10 Essential (primary) hypertension: Secondary | ICD-10-CM | POA: Diagnosis not present

## 2017-06-01 DIAGNOSIS — I2510 Atherosclerotic heart disease of native coronary artery without angina pectoris: Secondary | ICD-10-CM | POA: Insufficient documentation

## 2017-06-01 DIAGNOSIS — Z952 Presence of prosthetic heart valve: Secondary | ICD-10-CM

## 2017-06-01 DIAGNOSIS — I350 Nonrheumatic aortic (valve) stenosis: Secondary | ICD-10-CM

## 2017-06-01 DIAGNOSIS — I714 Abdominal aortic aneurysm, without rupture, unspecified: Secondary | ICD-10-CM

## 2017-06-01 MED ORDER — AMOXICILLIN 500 MG PO TABS: 2000.0000 mg | ORAL_TABLET | ORAL | 6 refills | Status: DC

## 2017-06-01 NOTE — Patient Instructions (Signed)
Medication Instructions:  1) you may STOP PLAVIX 11/10/2017 2) We discussed the importance of taking an antibiotic prior to any dental, gastrointestinal, genitourinary procedures to prevent damage to the heart valves from infection. You have been called in a prescription for AMOXIL 2 g to be taken 1 hour prior to dental visits.   Labwork: None  Testing/Procedures: Your provider has requested that you have an echocardiogram in 1 year. Echocardiography is a painless test that uses sound waves to create images of your heart. It provides your doctor with information about the size and shape of your heart and how well your heart's chambers and valves are working. This procedure takes approximately one hour. There are no restrictions for this procedure.    Follow-Up: Your provider recommends that you schedule a follow-up appointment in 1 year with Nell Range, PA. You will be called to schedule this appointment and your echo.  Any Other Special Instructions Will Be Listed Below (If Applicable).     If you need a refill on your cardiac medications before your next appointment, please call your pharmacy.

## 2017-06-14 ENCOUNTER — Encounter: Payer: Self-pay | Admitting: Thoracic Surgery (Cardiothoracic Vascular Surgery)

## 2017-06-15 ENCOUNTER — Encounter: Payer: Self-pay | Admitting: Thoracic Surgery (Cardiothoracic Vascular Surgery)

## 2017-06-15 ENCOUNTER — Encounter: Payer: Self-pay | Admitting: Internal Medicine

## 2017-06-16 ENCOUNTER — Telehealth (HOSPITAL_COMMUNITY): Payer: Self-pay

## 2017-06-16 NOTE — Telephone Encounter (Signed)
Attempted to call patient in regards to Cardiac Rehab - lm on vm °

## 2017-06-28 ENCOUNTER — Telehealth (HOSPITAL_COMMUNITY): Payer: Self-pay

## 2017-06-28 NOTE — Telephone Encounter (Signed)
Attempted to call pt a 2nd time- LM ON VM ° °Mailed letter out °

## 2017-06-28 NOTE — Telephone Encounter (Signed)
Pt called back and stated that she is not interested in the Cardiac Rehab Program at the moment. She also states she has a blockage in her leg and don't believe she will be able to do much.  Closed referral

## 2017-07-20 ENCOUNTER — Ambulatory Visit: Payer: Medicare Other | Admitting: Cardiology

## 2017-07-23 NOTE — Progress Notes (Signed)
[name] Date of Birth: 03-16-1940   History of Present Illness: [name] is seen today for follow up CAD, AS, and PAD. She has known CAD with redo CABG in 2001.  She has a known abdominal aortic aneurysm with last ultrasound  showing a  3.1 cm aneurysm in August 2018. She has a history of PAD with right iliac occlusion. Last ABIs in August 2018 showed stable right ABI of 0.75.  She has chronic claudication symptoms with pain in right thigh with walking for long distances. This has not changed.[name] Her last nuclear stress test in January 2016 showed  anterior wall ischemia. This was unchanged from 2012 and 2013. She had a cardiac cath in 2008 which  showed patent grafts.   She has  lymphocytic colitis. Followed by Dr. Gala Romney. This flared last Spring and she was placed on Entecort. Following this she developed edema and elevated BP. She states she felt dizzy, HA, and "crazy". She was started on Avapro. Symptoms resolved after she came off steroids.   Over the past year or so she has experienced significant progression of symptoms of exertional shortness of breath and fatigue. Follow-up echocardiogram performed March 29, 2017 revealed normal left ventricular systolic function with severe aortic stenosis. Peak velocity across the aortic valve measured 4.1 m/s corresponding to mean transvalvular gradient estimated 39 mmHg. The DVI was reported 0.20 with aortic valve area calculated 0.69 cm. Left ventricular ejection fraction was estimated 55-60%. Cardiac cath performed 04/04/2017 revealed peak to peak gradient of 36 mmHg and a mean gradient of 32 mmHg and severe 3 vessel CAD with continued patency of LIMA to mid LAD, free RIMA to the distal LAD, and sequential SVG to the first and second OM of the Cx.   She underwent successful TAVR with a 26 mm Sapien 3 on 05/09/2017. Post operative echo showed a normally functioning TAVR with no significant PVL; mean gradient 11 mm Hg. She was discharged on ASA and  plavix. She does have a renal lesion and plans to have nephrectomy after she completes 6 months of plavix.  On follow up today she is doing well. Still feels tired but breathing is doing very well. She does note more claudication now that she is able to do more. [facility] walking on the treadmill or riding a bike. She generally is doing well.   Current Outpatient Medications on File Prior to Visit  Medication Sig Dispense Refill  . acetaminophen (TYLENOL) 500 MG tablet Take 1,000 mg by mouth every 6 (six) hours as needed for moderate pain or headache.     . ALPRAZolam (XANAX) 0.25 MG tablet Take 0.25 mg by mouth 3 (three) times daily as needed for anxiety.  0  . amLODipine (NORVASC) 10 MG tablet TAKE 1 TABLET BY MOUTH EVERY DAY 30 tablet 10  . amoxicillin (AMOXIL) 500 MG tablet Take 4 tablets (2,000 mg total) by mouth as directed. Take 1 hour prior to dental visits. 12 tablet 6  . aspirin EC 81 MG tablet Take 81 mg by mouth at bedtime.     . calcium elemental as carbonate (BARIATRIC TUMS ULTRA) 400 MG chewable tablet Chew 1,000 mg by mouth 3 (three) times daily as needed for heartburn.    . chlorthalidone (HYGROTON) 25 MG tablet Take 12.5 mg by mouth daily.    . clopidogrel (PLAVIX) 75 MG tablet Take 1 tablet (75 mg total) by mouth daily. 30 tablet 5  .  FLUoxetine (PROZAC) 40 MG capsule Take 40 mg by mouth every evening.    . furosemide (LASIX) 20 MG tablet Take 20 mg by mouth daily as needed for edema.    . irbesartan (AVAPRO) 300 MG tablet TAKE 1 TABLET BY MOUTH EVERY DAY 30 tablet 4  . levothyroxine (SYNTHROID, LEVOTHROID) 50 MCG tablet Take 50 mcg by mouth daily before breakfast.    . loperamide (IMODIUM) 2 MG capsule Take 2-4 mg by mouth 4 (four) times daily as needed for diarrhea or loose stools.     . Nebivolol HCl (BYSTOLIC) 20 MG TABS Take 20 mg by mouth at bedtime.    . nitroGLYCERIN (NITROSTAT) 0.4 MG SL tablet Place 0.4 mg under the tongue every 5  (five) minutes as needed for chest pain (MAX 3 TABLETS).    . pantoprazole (PROTONIX) 40 MG tablet Take 1 tablet (40 mg total) by mouth daily. 30 tablet 11  . PROAIR HFA 108 (90 Base) MCG/ACT inhaler Inhale 2 puffs into the lungs every 4 (four) hours as needed for shortness of breath or wheezing.  2  . simethicone (MYLICON) 503 MG chewable tablet Chew 125 mg by mouth every 6 (six) hours as needed for flatulence.    . simvastatin (ZOCOR) 40 MG tablet Take 40 mg by mouth at bedtime.    . SYMBICORT 160-4.5 MCG/ACT inhaler Inhale 1 puff into the lungs 2 (two) times daily as needed (for respiratory issues.).   2   No current facility-administered medications on file prior to visit.     Allergies  Allergen Reactions  . Chantix [Varenicline Tartrate] Hives  . Dipyridamole Hives  . Varenicline Hives    Past Medical History:  Diagnosis Date  . Abdominal aortic aneurysm ([place])    infrarenal diagnosed with maximum measurement at 3.3 cm  . Depression   . Diverticulosis   . GERD (gastroesophageal reflux disease)   . History of tobacco abuse    Recurrent cigarette smoking  . Hyperlipidemia   . Hyperplastic colon polyp   . Hypertension   . Hypothyroidism   . Ischemic heart disease    had redo bypass surgery in 2001, at that time she had a free right internal mammary to the LAD, LAD endarterectomy, sequential vein graft to the OM1 and OM2 -- last catheterization was in 2008  . Leg fracture    remote right leg fracture  . Microscopic colitis   . Obesity    with gastric bypass surgery in 2004  . PAD (peripheral artery disease) ([facility])    right iliac occlusion  . PONV (postoperative nausea and vomiting)   . Right kidney mass   . S/P CABG x 4 1985   LIMA to LAD, Sequential SVG to OM1-OM2, SVG to RCA - Dr Redmond Pulling  . S/P redo CABG x 3 2001   Free RIMA to LAD with endarterectomy, sequential SVG to OM1-OM2 - Dr Servando Snare  . 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 ARTERIAL LINE INSERTION Right 05/09/2017   Procedure: ARTERIAL LINE  INSERTION  RIGHT RADIAL ARTERY;  Surgeon: Sherren Mocha, MD;  Location: [facility];  Service: Open Heart Surgery;  Laterality: Right;  . CARDIAC CATHETERIZATION  02/16/1999   Left heart catheterization with selective coronary  angiography, left ventricular angiography (RAO and LAO views), angiography of left internal mammary artery, and angiography  of saphenous vein grafts  . CARDIAC CATHETERIZATION  10/20/2006   Est. EF of 55% -- Totally occuladed native coronary circulation -- Persistent patency of the right mammary graft and the distal LAD artery with peristent patency of the left mammary graft to the proximal LAD artery with persistant patency of the saphenous vein graft to the sequentail branches of the OM        . CARPAL TUNNEL RELEASE    . CHOLECYSTECTOMY    . COLONOSCOPY  07/12/2007   Dr.Brodie- normal cecum and rectum, diverticulosis in the sigmoid colon  . COLONOSCOPY  1991   per Dr.Brodie office visit note= hyperplastic polyp  . COLONOSCOPY N/A 09/30/2014   Dr.Rourk- noraml appearing rectal mucosa, scattered L sided diverticula, colonic mucosa has a somewhat pale friable appearence diffusely, no ulcers or erosions seen, the distal 10cm of terminal ileum appeared entirely normal. bx=benign colorectal mucosa with lymphocytic colitis  . CORONARY ARTERY BYPASS GRAFT  1985  . CORONARY ARTERY BYPASS GRAFT  03/09/1999   Redo CABG x3 --  with the free right internal mammary artey to the LAD with an LAD endarterectomy -- Sequential saphenous vein graft to OM1 and OM2                   . ESOPHAGOGASTRODUODENOSCOPY N/A 09/30/2014   Dr.Rourk-mild erosive reflux esophagitis s/p bariatric surgery  . EYE SURGERY     catracts  . GASTRIC BYPASS  2004  . RIGHT/LEFT HEART CATH AND CORONARY/GRAFT  ANGIOGRAPHY N/A 04/04/2017   Procedure: RIGHT/LEFT HEART CATH AND CORONARY/GRAFT ANGIOGRAPHY;  Surgeon: Martinique, Kiyana Vazguez M, MD;  Location: [facility];  Service: Cardiovascular;  Laterality: N/A;  . TEE WITHOUT CARDIOVERSION N/A 05/09/2017   Procedure: TRANSESOPHAGEAL ECHOCARDIOGRAM (TEE);  Surgeon: Sherren Mocha, MD;  Location: [facility];  Service: Open Heart Surgery;  Laterality: N/A;  . TRANSCATHETER AORTIC VALVE REPLACEMENT, TRANSFEMORAL N/A 05/09/2017   Procedure: TRANSCATHETER AORTIC VALVE REPLACEMENT, TRANSFEMORAL using a 4mm Edwards Sapien 3 Aortic Valve;  Surgeon: Sherren Mocha, MD;  Location: [facility];  Service: Open Heart Surgery;  Laterality: N/A;    Social History   Tobacco Use  Smoking Status Current Every Day Smoker  . Packs/day: 0.50  . Types: Cigarettes  . Start date: 01/10/1985  . Last attempt to quit: 02/11/2016  . Years since quitting: 1.4  Smokeless Tobacco Never Used  Tobacco Comment   smokes 1-2 cigarettes a day    Social History   Substance and Sexual Activity  Alcohol Use No  . Alcohol/week: 0.0 oz    Family History  Problem Relation Age of Onset  . Stroke Father   . Diabetes Mother   . Pancreatic cancer Mother     Review of Systems: The review of systems is as above.  All other systems were reviewed and are negative.  Physical Exam: BP 126/68   Pulse 65   Ht 5\' 7"  (1.702 m)   Wt 209 lb (94.8 kg)   SpO2 91%   BMI 32.73 kg/m  GENERAL:  Obese WF in NAD HEENT:  PERRL, EOMI, sclera are clear. Oropharynx is clear. NECK:  No jugular venous distention, carotid upstroke brisk and symmetric, bilateral  bruits, no thyromegaly or adenopathy LUNGS:  Clear to auscultation bilaterally CHEST:  Unremarkable HEART:  RRR,  PMI not displaced or sustained,S1 and S2 within normal limits, no S3, no S4: no clicks, no rubs, slight  systolic murmur RUSB ABD:  Soft, nontender. BS +, no masses or bruits. No hepatomegaly,  no splenomegaly EXT: pulses in feet not palpable,  feet warm and dry. no edema, no cyanosis no clubbing SKIN:  Warm and dry.  No rashes NEURO:  Alert and oriented x 3. Cranial nerves II through XII intact. PSYCH:  Cognitively intact     LABORATORY DATA: Lab Results  Component Value Date   CHOL 149 02/04/2014   HDL 60 02/04/2014   LDLCALC 64 02/04/2014   TRIG 124 02/04/2014   CHOLHDL 2.5 02/04/2014   Lab Results  Component Value Date   WBC 6.9 05/10/2017   HGB 11.1 (L) 05/10/2017   HCT 34.5 (L) 05/10/2017   PLT 129 (L) 05/10/2017   GLUCOSE 108 (H) 05/11/2017   CHOL 149 02/04/2014   TRIG 124 02/04/2014   HDL 60 02/04/2014   LDLCALC 64 02/04/2014   ALT 24 05/05/2017   AST 25 05/05/2017   NA 135 05/11/2017   K 3.8 05/11/2017   CL 103 05/11/2017   CREATININE 1.05 (H) 05/11/2017   BUN 12 05/11/2017   CO2 25 05/11/2017   TSH 3.180 07/07/2016   INR 0.98 05/05/2017   HGBA1C 5.8 (H) 05/05/2017   Labs dated 10/05/15: glucose 108. Otherwise CMET is normal. Cholesterol 130, triglycerides 114, HDL 63, LDL 44. TSH normal.  Dated 11/02/16: cholesterol 138, triglycerides 78, LDL 49, HDL 73. CMET and CBC normal.  Echo 04/19/16: Study Conclusions  - Left ventricle: The cavity size was normal. Wall thickness was   normal. Systolic function was normal. The estimated ejection   fraction was in the range of 60% to 65%. Wall motion was normal;   there were no regional wall motion abnormalities. Doppler   parameters are consistent with pseudonormal left ventricular   relaxation (grade 2 diastolic dysfunction). The E/e&' ratio is   >15, suggesting elevated LV filling pressure. - Aortic valve: Calcified leaflets. Moderate stenosis. Mean   gradient (S): 35 mm Hg. Peak gradient (S): 61 mm Hg. Valve area   (VTI): 0.98 cm^2. Valve area (Vmax): 1.05 cm^2. Valve area   (Vmean): 0.98 cm^2. - Mitral valve: Mildly thickened leaflets . There was mild   regurgitation. - Left atrium: The atrium was moderately dilated. - Right ventricle: The cavity  size was mildly dilated. Systolic   function is mildly reduced. - Tricuspid valve: There was mild regurgitation. - Pulmonary arteries: PA peak pressure: 34 mm Hg (S). - Inferior vena cava: The vessel was normal in size. The   respirophasic diameter changes were in the normal range (>= 50%),   consistent with normal central venous pressure.  Impressions:  - Compared to a prior study in 2012, there is now moderate aortic   stenosis - AVA around 1.05 cm2 with mean gradient of 35 mmHg.  Echo 03/29/17: Study Conclusions  - Left ventricle: The cavity size was normal. Wall thickness was   increased in a pattern of mild LVH. Systolic function was normal.   The estimated ejection fraction was in the range of 55% to 60%.   Wall motion was normal; there were no regional wall motion   abnormalities. Doppler parameters are consistent with abnormal   left ventricular relaxation (grade 1 diastolic dysfunction). - Aortic valve: Poorly visualized. Probably trileaflet; severely   calcified leaflets. There was severe stenosis. There was mild   regurgitation. Mean gradient (S): 41 mm Hg. Peak gradient (S): 67   mm Hg. Valve area (VTI): 0.69 cm^2. - Mitral valve: Mildly calcified annulus. Mildly calcified leaflets   . There was trivial regurgitation. - Left  atrium: The atrium was moderately dilated. - Right ventricle: The cavity size was normal. Systolic function   was mildly reduced. - Tricuspid valve: Peak RV-RA gradient (S): 32 mm Hg. - Pulmonary arteries: PA peak pressure: 35 mm Hg (S). - Inferior vena cava: The vessel was normal in size. The   respirophasic diameter changes were in the normal range (>= 50%),   consistent with normal central venous pressure.  Impressions:  - Normal LV size with mild LV hypertrophy. EF 55-60%. Severe aortic   stenosis, mild aortic insufficiency. Moderate left atrial   enlargement. Normal RV size with mildly decreased systolic   function.  Cardiac cath  04/04/17:  RIGHT/LEFT HEART CATH AND CORONARY/GRAFT ANGIOGRAPHY  Conclusion     Ost LAD to Prox LAD lesion is 100% stenosed.  Prox LAD to Mid LAD lesion is 100% stenosed.  Ost Cx to Mid Cx lesion is 100% stenosed.  Prox RCA to Mid RCA lesion is 100% stenosed.  LIMA graft was visualized by angiography and is normal in caliber.  The graft exhibits no disease.  Seq SVG- OM 1 and OM 2 graft was visualized by angiography and is large.  SVG graft was visualized by angiography and is normal in caliber.  The graft exhibits no disease.  Dist Graft lesion is 30% stenosed.  Origin lesion is 35% stenosed.  There is severe aortic valve stenosis.  LV end diastolic pressure is mildly elevated.  Hemodynamic findings consistent with mild pulmonary hypertension.   1. Severe 3 vessel occlusive CAD 2. Patent LIMA to the mid LAD. This only supplies a septal perforator.  3. Patent SVG to mid to distal LAD 4. Patent sequential SVG to OM 1 and OM 2 5. Severe Aortic stenosis. Mean gradient 32 mm Hg, peak gradient 35 mm Hg. Valve area 1.1 cm squared. Index 0.56 6. Normal cardiac output 7. Mild pulmonary HTN with mildly elevated LV filling pressures.   Plan: Coronary circulation is unchanged from 2008. Patient has progressive symptomatic aortic stenosis. Will refer for TAVR evaluation.    Echo 06/01/17: Study Conclusions  - Left ventricle: The cavity size was normal. Wall thickness was   increased in a pattern of mild LVH. Systolic function was normal.   The estimated ejection fraction was in the range of 55% to 60%.   Wall motion was normal; there were no regional wall motion   abnormalities. Doppler parameters are consistent with abnormal   left ventricular relaxation (grade 1 diastolic dysfunction).   Doppler parameters are consistent with high ventricular filling   pressure. - Aortic valve: A bioprosthesis was present. Valve area (VTI): 1.56   cm^2. - Mitral valve: There was mild  regurgitation. - Left atrium: The atrium was mildly dilated.  Impressions:  - Normal LV systolic function; mild LVH; mild diastolic   dysfunction; s/p AVR with mean gradient 14 mmHg and no AI; mild   MR; mild LAE; mild TR.  Assessment / Plan: 1. Coronary disease status post CABG. Redo coronary bypass surgery in 2001 with sequential saphenous vein graft to the first and second obtuse marginal vessels and a free RIMA graft to the LAD. Prior LIMA graft to the proximal LAD. Myoview study January 2016 was stable showing anterior ischemia similar to prior studies.  Cardiac cath in March 2019 showed patent grafts. No significant angina. Continue aspirin, beta blocker, and statin therapy.   2. Severe Aortic stenosis s/p TAVR in April 2019. . Follow up Echo satisfactory. Continue Plavix 6 months post procedure. SBE  prophylaxis.  3. PAD with right iliac occlusion. Claudication more noticeable since she is more active.  Dopplers in August 2018 showed stable ABI of 0.75 on the right and normal on the left. Abd/pelvic CT in April 2019 was stable.   4. Abdominal aortic aneurysm measuring 3.4 x 3.0 cm by CT. Will follow yearly.  4. Hypertension- well controlled.   6. Tobacco abuse-encouraged complete cessation.  7. Obesity.  8. Lymphocytic colitis. Per Dr. Gala Romney.  9. Carotid arterial disease- no significant stenosis.   10. Right renal mass 3x4 cm  noted on CT. Plan for nephrectomy in November by Dr. Alinda Money. Will discontinue Plavix Nov. 1.   I will follow up in 6 months.

## 2017-07-24 ENCOUNTER — Encounter: Payer: Self-pay | Admitting: Cardiology

## 2017-07-24 ENCOUNTER — Ambulatory Visit: Payer: Medicare Other | Admitting: Cardiology

## 2017-07-24 VITALS — BP 126/68 | HR 65 | Ht 67.0 in | Wt 209.0 lb

## 2017-07-24 DIAGNOSIS — Z952 Presence of prosthetic heart valve: Secondary | ICD-10-CM | POA: Diagnosis not present

## 2017-07-24 DIAGNOSIS — I739 Peripheral vascular disease, unspecified: Secondary | ICD-10-CM

## 2017-07-24 DIAGNOSIS — I350 Nonrheumatic aortic (valve) stenosis: Secondary | ICD-10-CM

## 2017-07-24 DIAGNOSIS — I25810 Atherosclerosis of coronary artery bypass graft(s) without angina pectoris: Secondary | ICD-10-CM | POA: Diagnosis not present

## 2017-07-24 DIAGNOSIS — I10 Essential (primary) hypertension: Secondary | ICD-10-CM

## 2017-07-24 NOTE — Patient Instructions (Signed)
Continue your current therapy  Good luck with your surgery in November  I will see you in 6 months.

## 2017-08-03 ENCOUNTER — Other Ambulatory Visit: Payer: Self-pay | Admitting: Cardiology

## 2017-08-03 DIAGNOSIS — I714 Abdominal aortic aneurysm, without rupture, unspecified: Secondary | ICD-10-CM

## 2017-08-08 ENCOUNTER — Other Ambulatory Visit: Payer: Self-pay

## 2017-08-08 ENCOUNTER — Telehealth: Payer: Self-pay | Admitting: Cardiology

## 2017-08-08 DIAGNOSIS — I739 Peripheral vascular disease, unspecified: Secondary | ICD-10-CM

## 2017-08-08 DIAGNOSIS — I714 Abdominal aortic aneurysm, without rupture, unspecified: Secondary | ICD-10-CM

## 2017-08-08 NOTE — Telephone Encounter (Signed)
Pt called because she received a letter that she was due for an appt and was sent a recall letter.. Advised her that she is due in August for her yearly Vascular US ABI And AAA Duplex. Pt agrees and advised her will order her tests and she will be called to schedule.

## 2017-08-08 NOTE — Telephone Encounter (Signed)
New Message:     Pt is calling and states she received a letter about having a test done in imaging but pt is questioning this because she just saw dr. Martinique and he didn't say anything about having any test done.

## 2017-08-14 ENCOUNTER — Other Ambulatory Visit: Payer: Self-pay | Admitting: Cardiology

## 2017-08-14 DIAGNOSIS — I739 Peripheral vascular disease, unspecified: Secondary | ICD-10-CM

## 2017-08-16 ENCOUNTER — Ambulatory Visit (HOSPITAL_COMMUNITY)
Admission: RE | Admit: 2017-08-16 | Discharge: 2017-08-16 | Disposition: A | Discharge status: Home / Self Care | Payer: Medicare Other | Source: Ambulatory Visit | Attending: Internal Medicine | Admitting: Internal Medicine

## 2017-08-16 DIAGNOSIS — I714 Abdominal aortic aneurysm, without rupture, unspecified: Secondary | ICD-10-CM

## 2017-08-16 DIAGNOSIS — I6523 Occlusion and stenosis of bilateral carotid arteries: Secondary | ICD-10-CM | POA: Insufficient documentation

## 2017-08-16 DIAGNOSIS — I739 Peripheral vascular disease, unspecified: Secondary | ICD-10-CM | POA: Insufficient documentation

## 2017-09-11 ENCOUNTER — Other Ambulatory Visit: Payer: Self-pay | Admitting: Gastroenterology

## 2017-11-15 ENCOUNTER — Other Ambulatory Visit: Payer: Self-pay | Admitting: *Deleted

## 2017-11-15 MED ORDER — SIMVASTATIN 40 MG PO TABS: 40.0000 mg | ORAL_TABLET | Freq: Every day | ORAL | 0 refills | Status: DC

## 2017-12-06 ENCOUNTER — Other Ambulatory Visit: Payer: Self-pay | Admitting: Urology

## 2017-12-06 ENCOUNTER — Ambulatory Visit (HOSPITAL_COMMUNITY)
Admission: RE | Admit: 2017-12-06 | Discharge: 2017-12-06 | Disposition: A | Discharge status: Home / Self Care | Payer: Medicare Other | Source: Ambulatory Visit | Attending: Urology | Admitting: Urology

## 2017-12-06 DIAGNOSIS — D49511 Neoplasm of unspecified behavior of right kidney: Secondary | ICD-10-CM | POA: Diagnosis present

## 2017-12-18 ENCOUNTER — Other Ambulatory Visit: Payer: Self-pay | Admitting: Urology

## 2017-12-19 ENCOUNTER — Other Ambulatory Visit: Payer: Self-pay | Admitting: Urology

## 2017-12-19 NOTE — Progress Notes (Signed)
12/06/2017- noted in Epic-CXR  08/17/2017- noted in Epic- VAS [place] AAA Duplex complete  07/24/2017- noted in [place]- office note from Dr. Peter Martinique  06/01/2017- noted in [place]  05/23/2017- noted in [facility]  05/09/2017- noted in Epic-Transcatheter Aortic Valve Replacement Surgery  04/04/2017- noted in Epic- Right/Left heart cath and coronary graft angiography

## 2017-12-19 NOTE — Patient Instructions (Addendum)
ELICIA LUI  12/19/2017   Your procedure is scheduled on: Thursday 12/21/2017  Report to [facility] Main  Entrance              Report to admitting at  150 PM    Call this number if you have problems the morning of surgery [phone]    Remember: Do not eat food  :After Midnight. May have clear liquids from midnight up until 0950 am then nothing until after surgery!    CLEAR LIQUID DIET   Foods Allowed                                                                     Foods Excluded  Coffee and tea, regular and decaf                             liquids that you cannot  Plain Jell-O in any flavor                                             see through such as: Fruit ices (not with fruit pulp)                                     milk, soups, orange juice  Iced Popsicles                                    All solid food Carbonated beverages, regular and diet                                    Cranberry, grape and apple juices Sports drinks like Gatorade Lightly seasoned clear broth or consume(fat free) Sugar, honey syrup  Sample Menu Breakfast                                Lunch                                     Supper Cranberry juice                    Beef broth                            Chicken broth Jell-O                                     Grape juice  Apple juice Coffee or tea                        Jell-O                                      Popsicle                                                Coffee or tea                        Coffee or tea  _____________________________________________________________________               BRUSH YOUR TEETH MORNING OF SURGERY AND RINSE YOUR MOUTH OUT, NO CHEWING GUM CANDY OR MINTS.     Take these medicines the morning of surgery with A SIP OF WATER: Amlodipine (Norvasc),  Levothyroxine (Synthroid), Pantoprazole (Protonix), use Pro-air inhaler if needed and Symbicort  inhaler if needed and bring inhalers with you to the hospital                                 You may not have any metal on your body including hair pins and              piercings  Do not wear jewelry, make-up, lotions, powders or perfumes, deodorant             Do not wear nail polish.  Do not shave  48 hours prior to surgery.                Do not bring valuables to the hospital. [place].  Contacts, dentures or bridgework may not be worn into surgery.  Leave suitcase in the car. After surgery it may be brought to your room.     Patients discharged the day of surgery will not be allowed to drive home.  Name and phone number of your driver:                       Please read over the following fact sheets you were given: _____________________________________________________________________             [facility] - Preparing for Surgery Before surgery, you can play an important role.  Because skin is not sterile, your skin needs to be as free of germs as possible.  You can reduce the number of germs on your skin by washing with CHG (chlorahexidine gluconate) soap before surgery.  CHG is an antiseptic cleaner which kills germs and bonds with the skin to continue killing germs even after washing. Please DO NOT use if you have an allergy to CHG or antibacterial soaps.  If your skin becomes reddened/irritated stop using the CHG and inform your nurse when you arrive at Short Stay. Do not shave (including legs and underarms) for at least 48 hours prior to the first CHG shower.  You may shave your face/neck. Please follow these instructions carefully:  1.  Shower with CHG Soap the night before surgery  and the  morning of Surgery.  2.  If you choose to wash your hair, wash your hair first as usual with your  normal  shampoo.  3.  After you shampoo, rinse your hair and body thoroughly to remove the  shampoo.                           4.  Use  CHG as you would any other liquid soap.  You can apply chg directly  to the skin and wash                       Gently with a scrungie or clean washcloth.  5.  Apply the CHG Soap to your body ONLY FROM THE NECK DOWN.   Do not use on face/ open                           Wound or open sores. Avoid contact with eyes, ears mouth and genitals (private parts).                       Wash face,  Genitals (private parts) with your normal soap.             6.  Wash thoroughly, paying special attention to the area where your surgery  will be performed.  7.  Thoroughly rinse your body with warm water from the neck down.  8.  DO NOT shower/wash with your normal soap after using and rinsing off  the CHG Soap.                9.  Pat yourself dry with a clean towel.            10.  Wear clean pajamas.            11.  Place clean sheets on your bed the night of your first shower and do not  sleep with pets. Day of Surgery : Do not apply any lotions/deodorants the morning of surgery.  Please wear clean clothes to the hospital/surgery center.  FAILURE TO FOLLOW THESE INSTRUCTIONS MAY RESULT IN THE CANCELLATION OF YOUR SURGERY PATIENT SIGNATURE_________________________________  NURSE SIGNATURE__________________________________  ________________________________________________________________________

## 2017-12-19 NOTE — Patient Instructions (Signed)
ROSELL KHOURI  12/19/2017   Your procedure is scheduled on: Thursday 12/28/2017  Report to [facility] Main  Entrance              Report to admitting at   1115 AM    Call this number if you have problems the morning of surgery [phone]               Follow Bowel prep from Dr. Lynne Logan office the day before surgery and follow a clear liquid diet all day!             The day before surgery on Wednesday 12/27/2017, drink one bottle of Magnesium Citrate by noon  And then clear liquids all day.    Remember: Do not eat food   :After Midnight.May have clear liquids from midnight up until 0715 am then nothing until after surgery!    CLEAR LIQUID DIET   Foods Allowed                                                                     Foods Excluded  Coffee and tea, regular and decaf                             liquids that you cannot  Plain Jell-O in any flavor                                             see through such as: Fruit ices (not with fruit pulp)                                     milk, soups, orange juice  Iced Popsicles                                    All solid food Carbonated beverages, regular and diet                                    Cranberry, grape and apple juices Sports drinks like Gatorade Lightly seasoned clear broth or consume(fat free) Sugar, honey syrup  Sample Menu Breakfast                                Lunch                                     Supper Cranberry juice                    Beef broth  Chicken broth Jell-O                                     Grape juice                           Apple juice Coffee or tea                        Jell-O                                      Popsicle                                                Coffee or tea                        Coffee or tea  _____________________________________________________________________               BRUSH YOUR TEETH  MORNING OF SURGERY AND RINSE YOUR MOUTH OUT, NO CHEWING GUM CANDY OR MINTS.     Take these medicines the morning of surgery with A SIP OF WATER:Amlodipine (Norvasc), Bystolic, Levothyroxine (Synthroid), Pantoprazole (Protonix), use Pro-air inhaler if needed and use Symbicort inhaler if needed and bring inhalers with you to the hospital.                                   You may not have any metal on your body including hair pins and              piercings  Do not wear jewelry, make-up, lotions, powders or perfumes, deodorant             Do not wear nail polish.  Do not shave  48 hours prior to surgery.                Do not bring valuables to the hospital. [place].  Contacts, dentures or bridgework may not be worn into surgery.  Leave suitcase in the car. After surgery it may be brought to your room.                   Please read over the following fact sheets you were given: _____________________________________________________________________             [facility] - Preparing for Surgery Before surgery, you can play an important role.  Because skin is not sterile, your skin needs to be as free of germs as possible.  You can reduce the number of germs on your skin by washing with CHG (chlorahexidine gluconate) soap before surgery.  CHG is an antiseptic cleaner which kills germs and bonds with the skin to continue killing germs even after washing. Please DO NOT use if you have an allergy to CHG or antibacterial soaps.  If your skin becomes reddened/irritated stop using the CHG and inform your nurse when you arrive at Short Stay.  Do not shave (including legs and underarms) for at least 48 hours prior to the first CHG shower.  You may shave your face/neck. Please follow these instructions carefully:  1.  Shower with CHG Soap the night before surgery and the  morning of Surgery.  2.  If you choose to wash your hair, wash your hair  first as usual with your  normal  shampoo.  3.  After you shampoo, rinse your hair and body thoroughly to remove the  shampoo.                           4.  Use CHG as you would any other liquid soap.  You can apply chg directly  to the skin and wash                       Gently with a scrungie or clean washcloth.  5.  Apply the CHG Soap to your body ONLY FROM THE NECK DOWN.   Do not use on face/ open                           Wound or open sores. Avoid contact with eyes, ears mouth and genitals (private parts).                       Wash face,  Genitals (private parts) with your normal soap.             6.  Wash thoroughly, paying special attention to the area where your surgery  will be performed.  7.  Thoroughly rinse your body with warm water from the neck down.  8.  DO NOT shower/wash with your normal soap after using and rinsing off  the CHG Soap.                9.  Pat yourself dry with a clean towel.            10.  Wear clean pajamas.            11.  Place clean sheets on your bed the night of your first shower and do not  sleep with pets. Day of Surgery : Do not apply any lotions/deodorants the morning of surgery.  Please wear clean clothes to the hospital/surgery center.  FAILURE TO FOLLOW THESE INSTRUCTIONS MAY RESULT IN THE CANCELLATION OF YOUR SURGERY PATIENT SIGNATURE_________________________________  NURSE SIGNATURE__________________________________  ________________________________________________________________________   Adam Phenix  An incentive spirometer is a tool that can help keep your lungs clear and active. This tool measures how well you are filling your lungs with each breath. Taking long deep breaths may help reverse or decrease the chance of developing breathing (pulmonary) problems (especially infection) following:  A long period of time when you are unable to move or be active. BEFORE THE PROCEDURE   If the spirometer includes an indicator to  show your best effort, your nurse or respiratory therapist will set it to a desired goal.  If possible, sit up straight or lean slightly forward. Try not to slouch.  Hold the incentive spirometer in an upright position. INSTRUCTIONS FOR USE  1. Sit on the edge of your bed if possible, or sit up as far as you can in bed or on a chair. 2. Hold the incentive spirometer in an upright position. 3. Breathe out normally. 4. Place the mouthpiece  in your mouth and seal your lips tightly around it. 5. Breathe in slowly and as deeply as possible, raising the piston or the ball toward the top of the column. 6. Hold your breath for 3-5 seconds or for as long as possible. Allow the piston or ball to fall to the bottom of the column. 7. Remove the mouthpiece from your mouth and breathe out normally. 8. Rest for a few seconds and repeat Steps 1 through 7 at least 10 times every 1-2 hours when you are awake. Take your time and take a few normal breaths between deep breaths. 9. The spirometer may include an indicator to show your best effort. Use the indicator as a goal to work toward during each repetition. 10. After each set of 10 deep breaths, practice coughing to be sure your lungs are clear. If you have an incision (the cut made at the time of surgery), support your incision when coughing by placing a pillow or rolled up towels firmly against it. Once you are able to get out of bed, walk around indoors and cough well. You may stop using the incentive spirometer when instructed by your caregiver.  RISKS AND COMPLICATIONS  Take your time so you do not get dizzy or light-headed.  If you are in pain, you may need to take or ask for pain medication before doing incentive spirometry. It is harder to take a deep breath if you are having pain. AFTER USE  Rest and breathe slowly and easily.  It can be helpful to keep track of a log of your progress. Your caregiver can provide you with a simple table to help with  this. If you are using the spirometer at home, follow these instructions: [place] IF:   You are having difficultly using the spirometer.  You have trouble using the spirometer as often as instructed.  Your pain medication is not giving enough relief while using the spirometer.  You develop fever of 100.5 F (38.1 C) or higher. SEEK IMMEDIATE MEDICAL CARE IF:   You cough up bloody sputum that had not been present before.  You develop fever of 102 F (38.9 C) or greater.  You develop worsening pain at or near the incision site. MAKE SURE YOU:   Understand these instructions.  Will watch your condition.  Will get help right away if you are not doing well or get worse. Document Released: 05/09/2006 Document Revised: 03/21/2011 Document Reviewed: 07/10/2006 ExitCare Patient Information 2014 Exit[employer], [place].   ________________________________________________________________________  WHAT IS A BLOOD TRANSFUSION? Blood Transfusion Information  A transfusion is the replacement of blood or some of its parts. Blood is made up of multiple cells which provide different functions.  Red blood cells carry oxygen and are used for blood loss replacement.  White blood cells fight against infection.  Platelets control bleeding.  Plasma helps clot blood.  Other blood products are available for specialized needs, such as hemophilia or other clotting disorders. BEFORE THE TRANSFUSION  Who gives blood for transfusions?   Healthy volunteers who are fully evaluated to make sure their blood is safe. This is blood bank blood. Transfusion therapy is the safest it has ever been in the practice of medicine. Before blood is taken from a donor, a complete history is taken to make sure that person has no history of diseases nor engages in risky social behavior (examples are intravenous drug use or sexual activity with multiple partners). The donor's travel history is screened to minimize  risk of transmitting infections, such as malaria. The donated blood is tested for signs of infectious diseases, such as HIV and hepatitis. The blood is then tested to be sure it is compatible with you in order to minimize the chance of a transfusion reaction. If you or a relative donates blood, this is often done in anticipation of surgery and is not appropriate for emergency situations. It takes many days to process the donated blood. RISKS AND COMPLICATIONS Although transfusion therapy is very safe and saves many lives, the main dangers of transfusion include:   Getting an infectious disease.  Developing a transfusion reaction. This is an allergic reaction to something in the blood you were given. Every precaution is taken to prevent this. The decision to have a blood transfusion has been considered carefully by your caregiver before blood is given. Blood is not given unless the benefits outweigh the risks. AFTER THE TRANSFUSION  Right after receiving a blood transfusion, you will usually feel much better and more energetic. This is especially true if your red blood cells have gotten low (anemic). The transfusion raises the level of the red blood cells which carry oxygen, and this usually causes an energy increase.  The nurse administering the transfusion will monitor you carefully for complications. HOME CARE INSTRUCTIONS  No special instructions are needed after a transfusion. You may find your energy is better. Speak with your caregiver about any limitations on activity for underlying diseases you may have. SEEK MEDICAL CARE IF:   Your condition is not improving after your transfusion.  You develop redness or irritation at the intravenous (IV) site. SEEK IMMEDIATE MEDICAL CARE IF:  Any of the following symptoms occur over the next 12 hours:  Shaking chills.  You have a temperature by mouth above 102 F (38.9 C), not controlled by medicine.  Chest, back, or muscle pain.  People  around you feel you are not acting correctly or are confused.  Shortness of breath or difficulty breathing.  Dizziness and fainting.  You get a rash or develop hives.  You have a decrease in urine output.  Your urine turns a dark color or changes to pink, red, or brown. Any of the following symptoms occur over the next 10 days:  You have a temperature by mouth above 102 F (38.9 C), not controlled by medicine.  Shortness of breath.  Weakness after normal activity.  The white part of the eye turns yellow (jaundice).  You have a decrease in the amount of urine or are urinating less often.  Your urine turns a dark color or changes to pink, red, or brown. Document Released: 12/25/1999 Document Revised: 03/21/2011 Document Reviewed: 08/13/2007 [facility] Patient Information 2014 [place], [place].  _______________________________________________________________________

## 2017-12-20 ENCOUNTER — Encounter (HOSPITAL_COMMUNITY)
Admission: RE | Admit: 2017-12-20 | Discharge: 2017-12-20 | Disposition: A | Discharge status: Home / Self Care | Payer: Medicare Other | Source: Ambulatory Visit | Attending: Urology | Admitting: Urology

## 2017-12-20 ENCOUNTER — Encounter (HOSPITAL_COMMUNITY): Payer: Self-pay

## 2017-12-20 ENCOUNTER — Other Ambulatory Visit: Payer: Self-pay

## 2017-12-20 DIAGNOSIS — D49511 Neoplasm of unspecified behavior of right kidney: Secondary | ICD-10-CM | POA: Diagnosis present

## 2017-12-20 DIAGNOSIS — K219 Gastro-esophageal reflux disease without esophagitis: Secondary | ICD-10-CM | POA: Diagnosis not present

## 2017-12-20 DIAGNOSIS — C641 Malignant neoplasm of right kidney, except renal pelvis: Secondary | ICD-10-CM | POA: Insufficient documentation

## 2017-12-20 DIAGNOSIS — I10 Essential (primary) hypertension: Secondary | ICD-10-CM | POA: Diagnosis not present

## 2017-12-20 DIAGNOSIS — I2510 Atherosclerotic heart disease of native coronary artery without angina pectoris: Secondary | ICD-10-CM | POA: Diagnosis not present

## 2017-12-20 DIAGNOSIS — E785 Hyperlipidemia, unspecified: Secondary | ICD-10-CM | POA: Diagnosis not present

## 2017-12-20 DIAGNOSIS — Z952 Presence of prosthetic heart valve: Secondary | ICD-10-CM | POA: Diagnosis not present

## 2017-12-20 DIAGNOSIS — Z79890 Hormone replacement therapy (postmenopausal): Secondary | ICD-10-CM | POA: Diagnosis not present

## 2017-12-20 DIAGNOSIS — F329 Major depressive disorder, single episode, unspecified: Secondary | ICD-10-CM | POA: Diagnosis not present

## 2017-12-20 DIAGNOSIS — Z01812 Encounter for preprocedural laboratory examination: Secondary | ICD-10-CM | POA: Insufficient documentation

## 2017-12-20 DIAGNOSIS — F419 Anxiety disorder, unspecified: Secondary | ICD-10-CM | POA: Diagnosis not present

## 2017-12-20 DIAGNOSIS — E7800 Pure hypercholesterolemia, unspecified: Secondary | ICD-10-CM | POA: Diagnosis not present

## 2017-12-20 DIAGNOSIS — I739 Peripheral vascular disease, unspecified: Secondary | ICD-10-CM | POA: Diagnosis not present

## 2017-12-20 DIAGNOSIS — Z7902 Long term (current) use of antithrombotics/antiplatelets: Secondary | ICD-10-CM | POA: Diagnosis not present

## 2017-12-20 DIAGNOSIS — E039 Hypothyroidism, unspecified: Secondary | ICD-10-CM | POA: Diagnosis not present

## 2017-12-20 DIAGNOSIS — Z79899 Other long term (current) drug therapy: Secondary | ICD-10-CM | POA: Diagnosis not present

## 2017-12-20 DIAGNOSIS — Z951 Presence of aortocoronary bypass graft: Secondary | ICD-10-CM | POA: Diagnosis not present

## 2017-12-20 DIAGNOSIS — Z7982 Long term (current) use of aspirin: Secondary | ICD-10-CM | POA: Diagnosis not present

## 2017-12-20 HISTORY — DX: Atherosclerotic heart disease of native coronary artery without angina pectoris: I25.10

## 2017-12-20 LAB — BASIC METABOLIC PANEL
Anion gap: 7 (ref 5–15)
BUN: 19 mg/dL (ref 8–23)
CO2: 29 mmol/L (ref 22–32)
Calcium: 9.8 mg/dL (ref 8.9–10.3)
Chloride: 105 mmol/L (ref 98–111)
Creatinine, Ser: 1.05 mg/dL — ABNORMAL HIGH (ref 0.44–1.00)
GFR calc Af Amer: >60 mL/min (ref >60)
GFR calc non Af Amer: 52 mL/min — ABNORMAL LOW (ref >60)
Glucose, Bld: 105 mg/dL — ABNORMAL HIGH (ref 70–99)
Potassium: 4.5 mmol/L (ref 3.5–5.1)
Sodium: 141 mmol/L (ref 135–145)

## 2017-12-20 LAB — CBC
HEMATOCRIT: 40.8 % (ref 36.0–46.0)
Hemoglobin: 12.8 g/dL (ref 12.0–15.0)
MCH: 32.4 pg (ref 26.0–34.0)
MCHC: 31.4 g/dL (ref 30.0–36.0)
MCV: 103.3 fL — ABNORMAL HIGH (ref 80.0–100.0)
Platelets: 141 10*3/uL — ABNORMAL LOW (ref 150–400)
RBC: 3.95 MIL/uL (ref 3.87–5.11)
RDW: 12.4 % (ref 11.5–15.5)
WBC: 6.9 10*3/uL (ref 4.0–10.5)
nRBC: 0 % (ref 0.0–0.2)

## 2017-12-20 NOTE — H&P (Signed)
Office Visit Report     12/06/2017   --------------------------------------------------------------------------------   [name]  MRN: [id number]  PRIMARY CARE:  Chauncey Reading, MD  DOB: [birth year], 77 year old Female  REFERRING:  Valentina Gu. Roxy Manns, MD  SSN:   PROVIDER:  Raynelle Bring, M.D.    LOCATION:  [facility] 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 She was noted to have stable coronary artery disease that appeared similar to her catheterization from 2008. However, she had had progression of her aortic stenosis and was referred to Dr. 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 She has done quite well and has recovered uneventfully. As part of her evaluation, she did undergo a CT angiogram. Incidentally, she was found to have what appears to be a 4.1 cm enhancing centrally located, interpolar right renal neoplasm raising suspicion for possible renal malignancy. No regional lymphadenopathy or evidence of metastatic disease was noted. A chest CT did not demonstrate any pulmonary nodules or concerning findings to suggest metastatic disease. Notably, this mass was not present on her prior CT scan in 2013. Her follow up echocardiogram after her TAVR was satisfactory and she was seen by Dr. Martinique in July who felt she could safely discontinue Plavix on Nov 1 and proceed with renal surgery as needed. Since her last visit with me, she has been doing quite well following her aortic valve replacement. She denies any shortness of breath or chest pain since her last visit with Dr. Martinique in July.    Family history of kidney cancer: None  Family history of ESRD: None   Imaging: (04/18/17) - A CT angiogram of the chest and abdomen demonstrated a hyperdense  lesion of the right kidney located in the interpolar region. This does not distort the normal shape of the kidney.  Side of renal neoplasm: Right  Size of renal neoplasm: 4.1 cm  Location of renal neoplasm: Interpolar, central/perihilar  Exophytic or endophytic: Endophytic (100%)  Renal nephrometry score: 11x   Renal artery anatomy: Two renal arteries  Renal vein anatomy: Single renal vein   Contralateral renal lesions: None.  Regional lymphadenopathy: None.  Adrenal masses: None.  Renal vein/IVC involvement: No.  Metastatic disease to the abdomen: No.   Chest imaging: 04/18/17 - CT chest - negative  LFTs: Normal.   Baseline renal function: Cr 1.05, eGFR 59 ml/min   PMH: Past medical history is significant for coronary artery disease, aortic stenosis status post valve replacement, hyperlipidemia, hypertension, hypothyroidism, depression, arthritis, anxiety, and esophageal reflux disease.  PSH: She has undergone coronary artery bypass grafting in 1985 and 2001. She has had a laparoscopic gastric bypass and cholecystectomy in 2004. Her transcatheter aortic valve replacement was performed in April 2019.     ALLERGIES: Chantix Persantine    MEDICATIONS: Aspirin  Hydrochlorothiazide  Levothyroxine Sodium  Plavix  Simvastatin  Amlodipine Besylate  Bystolic  Irbesartan  Protonix  Prozac     GU PSH: Locm 300-399Mg/Ml Iodine,1Ml - 11/27/2017, 05/24/2017      PSH Notes: TAVR   NON-GU PSH: Cholecystectomy (laparoscopic) Coronary Artery Bypass Grafting Gastric Bypass For Obesity    GU PMH: Right renal neoplasm - 05/16/2017    NON-GU PMH: Anxiety Arthritis Depression GERD Heart disease, unspecified Hypercholesterolemia Hypertension Hypothyroidism    FAMILY HISTORY: 2 daughters - Runs in Family pancreatic cancer - Mother stroke -  Father   SOCIAL HISTORY: Marital Status: Married Preferred Language: English; Ethnicity: Not Hispanic Or Latino; Race: White Current  Smoking Status: Patient does not smoke anymore.   Tobacco Use Assessment Completed: Used Tobacco in last 30 days? Does drink.  Drinks 3 caffeinated drinks per day.    REVIEW OF SYSTEMS:    GU Review Female:   Patient reports frequent urination and get up at night to urinate. Patient denies hard to postpone urination, burning /pain with urination, leakage of urine, stream starts and stops, trouble starting your stream, have to strain to urinate, and currently pregnant.  Gastrointestinal (Upper):   Patient denies nausea and vomiting.  Gastrointestinal (Lower):   Patient denies diarrhea and constipation.  Constitutional:   Patient denies weight loss, fatigue, fever, and night sweats.  Skin:   Patient denies skin rash/ lesion and itching.  Eyes:   Patient denies blurred vision and double vision.  Ears/ Nose/ Throat:   Patient denies sore throat and sinus problems.  Hematologic/Lymphatic:   Patient denies swollen glands and easy bruising.  Cardiovascular:   Patient denies leg swelling and chest pains.  Respiratory:   Patient denies cough and shortness of breath.  Endocrine:   Patient denies excessive thirst.  Musculoskeletal:   Patient denies back pain and joint pain.  Neurological:   Patient denies headaches and dizziness.  Psychologic:   Patient denies depression and anxiety.   VITAL SIGNS:      12/06/2017 01:33 PM  Weight 209 lb / 94.8 kg  Height 67 in / 170.18 cm  BP 114/68 mmHg  Pulse 62 /min  BMI 32.7 kg/m   MULTI-SYSTEM PHYSICAL EXAMINATION:    Constitutional: Well-nourished. No physical deformities. Normally developed. Good grooming.  Neck: Neck symmetrical, not swollen. Normal tracheal position.  Respiratory: No labored breathing, no use of accessory muscles. Clear bilaterally.  Cardiovascular: Normal temperature, normal extremity pulses, no swelling, no varicosities. Regular rate and rhythm.  Lymphatic: No enlargement of neck, axillae, groin.  Skin: No paleness, no  jaundice, no cyanosis. No lesion, no ulcer, no rash.  Neurologic / Psychiatric: Oriented to time, oriented to place, oriented to person. No depression, no anxiety, no agitation.  Gastrointestinal: Soft, nondistended, nontender. Prior healed laparoscopic incisions in the upper abdomen noted. No abdominal masses.  Eyes: Normal conjunctivae. Normal eyelids.  Ears, Nose, Mouth, and Throat: Left ear no scars, no lesions, no masses. Right ear no scars, no lesions, no masses. Nose no scars, no lesions, no masses. Normal hearing. Normal lips.  Musculoskeletal: Normal gait and station of head and neck.     PAST DATA REVIEWED:  Source Of History:  Patient  Records Review:   Previous Patient Records  Urine Test Review:   Urinalysis  X-Ray Review: C.T. Chest/ Abd/Pelvis: Reviewed Films.    Notes:                     I have independently reviewed her recent CT scan of the chest, abdomen, and pelvis. She has no evidence to suggest metastatic disease to the chest. The enhancing right renal tumor remains completely endophytic extending toward the renal pelvis. This now measures 4.9 cm in largest diameter increased from 4.5 cm last spring. There are no pathologically enlarged regional lymph nodes, evidence of renal vein or IVC involvement, new contralateral renal lesions, or evidence of metastatic disease to the abdomen.   PROCEDURES:          Urinalysis w/Scope Dipstick Dipstick Cont'd Micro  Color: Yellow Bilirubin:  Neg mg/dL WBC/hpf: 0 - 5/hpf  Appearance: Clear Ketones: Neg mg/dL RBC/hpf: NS (Not Seen)  Specific Gravity: 1.025 Blood: Neg ery/uL Bacteria: NS (Not Seen)  pH: <=5.0 Protein: Neg mg/dL Cystals: NS (Not Seen)  Glucose: Neg mg/dL Urobilinogen: 1.0 mg/dL Casts: NS (Not Seen)    Nitrites: Neg Trichomonas: Not Present    Leukocyte Esterase: Trace leu/uL Mucous: Present      Epithelial Cells: 0 - 5/hpf      Yeast: NS (Not Seen)      Sperm: Not Present    Notes: microscopic not concentrated     ASSESSMENT:      ICD-10 Details  1 GU:   Right renal neoplasm - D49.511    PLAN:           Orders Labs CBC with Diff, CMP  X-Rays: Chest X-Ray Outside.  X-Ray Notes: ...          Schedule Return Visit/Planned Activity: Other See Visit Notes             Note: Will call to schedule surgery          Document Letter(s):  Created for Patient: Clinical Summary         Notes:   1. Right renal neoplasm concerning for malignancy: She is now off dual anti-platelet therapy in continues aspirin 81 mg. She does wish to proceed with a right laparoscopic radical nephrectomy as previously discussed and may continue aspirin 81 mg perioperatively. Considering the characterization of this tumor, there is a possibility this could represent a urothelial carcinoma and I have recommended that we proceed with cystoscopy and retrograde pyelography with possible ureteroscopy and biopsy if indicated depending on intraoperative findings. This will be scheduled prior to her nephrectomy. 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   We have discussed the risks of treatment in detail including but not limited to bleeding, infection, heart attack, stroke, death, venothromoboembolism, cancer recurrence, injury/damage to surrounding organs and structures, urine leak, the possibility of open surgical conversion for patients undergoing minimally invasive surgery, the risk of developing chronic kidney disease and its associated implications, and the potential risk of end stage renal disease possibly necessitating dialysis.   CC: Dr. Veronia Beets  Dr. Peter Martinique  Dr. Darylene Price     E & M CODE: I spent at least 42 minutes face to face with the patient, more than 50% of that time was spent on counseling and/or coordinating care.     * Signed by Raynelle Bring, M.D. on 12/06/17 at 8:37 PM  (EST)*

## 2017-12-20 NOTE — Progress Notes (Signed)
Patient came in for pre-op appointment today 12/20/2017 for her surgery on 12/21/2017. I also reviewed her instructions with her for her surgery on 12/28/2017.                [name]  12/19/2017   Your procedure is scheduled on: Thursday 12/28/2017  Report to [facility] Main  Entrance              Report to admitting at   1115 AM    Call this number if you have problems the morning of surgery [phone]               Follow Bowel prep from Dr. Lynne Logan office the day before surgery and follow a clear liquid diet all day!             The day before surgery on Wednesday 12/27/2017, drink one bottle of Magnesium Citrate by noon  And then clear liquids all day.    Remember: Do not eat food   :After Midnight.May have clear liquids from midnight up until 0715 am then nothing until after surgery!    CLEAR LIQUID DIET   Foods Allowed                                                                     Foods Excluded  Coffee and tea, regular and decaf                             liquids that you cannot  Plain Jell-O in any flavor                                             see through such as: Fruit ices (not with fruit pulp)                                     milk, soups, orange juice  Iced Popsicles                                    All solid food Carbonated beverages, regular and diet                                    Cranberry, grape and apple juices Sports drinks like Gatorade Lightly seasoned clear broth or consume(fat free) Sugar, honey syrup  Sample Menu Breakfast                                Lunch                                     Supper Cranberry juice  Beef broth                            Chicken broth Jell-O                                     Grape juice                           Apple juice Coffee or tea                        Jell-O                                      Popsicle                                                 Coffee or tea                        Coffee or tea  _____________________________________________________________________               BRUSH YOUR TEETH MORNING OF SURGERY AND RINSE YOUR MOUTH OUT, NO CHEWING GUM CANDY OR MINTS.     Take these medicines the morning of surgery with A SIP OF WATER:Amlodipine (Norvasc), Bystolic, Levothyroxine (Synthroid), Pantoprazole (Protonix), use Pro-air inhaler if needed and use Symbicort inhaler if needed and bring inhalers with you to the hospital.                                   You may not have any metal on your body including hair pins and              piercings  Do not wear jewelry, make-up, lotions, powders or perfumes, deodorant             Do not wear nail polish.  Do not shave  48 hours prior to surgery.                Do not bring valuables to the hospital. [place].  Contacts, dentures or bridgework may not be worn into surgery.  Leave suitcase in the car. After surgery it may be brought to your room.                   Please read over the following fact sheets you were given: _____________________________________________________________________             [facility] - Preparing for Surgery Before surgery, you can play an important role.  Because skin is not sterile, your skin needs to be as free of germs as possible.  You can reduce the number of germs on your skin by washing with CHG (chlorahexidine gluconate) soap before surgery.  CHG is an antiseptic cleaner which kills germs and bonds with the skin to continue killing germs even after washing. Please DO NOT use if  you have an allergy to CHG or antibacterial soaps.  If your skin becomes reddened/irritated stop using the CHG and inform your nurse when you arrive at Short Stay. Do not shave (including legs and underarms) for at least 48 hours prior to the first CHG shower.  You may shave your face/neck. Please follow these  instructions carefully:  1.  Shower with CHG Soap the night before surgery and the  morning of Surgery.  2.  If you choose to wash your hair, wash your hair first as usual with your  normal  shampoo.  3.  After you shampoo, rinse your hair and body thoroughly to remove the  shampoo.                           4.  Use CHG as you would any other liquid soap.  You can apply chg directly  to the skin and wash                       Gently with a scrungie or clean washcloth.  5.  Apply the CHG Soap to your body ONLY FROM THE NECK DOWN.   Do not use on face/ open                           Wound or open sores. Avoid contact with eyes, ears mouth and genitals (private parts).                       Wash face,  Genitals (private parts) with your normal soap.             6.  Wash thoroughly, paying special attention to the area where your surgery  will be performed.  7.  Thoroughly rinse your body with warm water from the neck down.  8.  DO NOT shower/wash with your normal soap after using and rinsing off  the CHG Soap.                9.  Pat yourself dry with a clean towel.            10.  Wear clean pajamas.            11.  Place clean sheets on your bed the night of your first shower and do not  sleep with pets. Day of Surgery : Do not apply any lotions/deodorants the morning of surgery.  Please wear clean clothes to the hospital/surgery center.  FAILURE TO FOLLOW THESE INSTRUCTIONS MAY RESULT IN THE CANCELLATION OF YOUR SURGERY PATIENT SIGNATURE_________________________________  NURSE SIGNATURE__________________________________  ________________________________________________________________________   Adam Phenix  An incentive spirometer is a tool that can help keep your lungs clear and active. This tool measures how well you are filling your lungs with each breath. Taking long deep breaths may help reverse or decrease the chance of developing breathing (pulmonary) problems (especially  infection) following:  A long period of time when you are unable to move or be active. BEFORE THE PROCEDURE   If the spirometer includes an indicator to show your best effort, your nurse or respiratory therapist will set it to a desired goal.  If possible, sit up straight or lean slightly forward. Try not to slouch.  Hold the incentive spirometer in an upright position. INSTRUCTIONS FOR USE  1. Sit on the edge of your bed if possible, or sit  up as far as you can in bed or on a chair. 2. Hold the incentive spirometer in an upright position. 3. Breathe out normally. 4. Place the mouthpiece in your mouth and seal your lips tightly around it. 5. Breathe in slowly and as deeply as possible, raising the piston or the ball toward the top of the column. 6. Hold your breath for 3-5 seconds or for as long as possible. Allow the piston or ball to fall to the bottom of the column. 7. Remove the mouthpiece from your mouth and breathe out normally. 8. Rest for a few seconds and repeat Steps 1 through 7 at least 10 times every 1-2 hours when you are awake. Take your time and take a few normal breaths between deep breaths. 9. The spirometer may include an indicator to show your best effort. Use the indicator as a goal to work toward during each repetition. 10. After each set of 10 deep breaths, practice coughing to be sure your lungs are clear. If you have an incision (the cut made at the time of surgery), support your incision when coughing by placing a pillow or rolled up towels firmly against it. Once you are able to get out of bed, walk around indoors and cough well. You may stop using the incentive spirometer when instructed by your caregiver.  RISKS AND COMPLICATIONS  Take your time so you do not get dizzy or light-headed.  If you are in pain, you may need to take or ask for pain medication before doing incentive spirometry. It is harder to take a deep breath if you are having pain. AFTER  USE  Rest and breathe slowly and easily.  It can be helpful to keep track of a log of your progress. Your caregiver can provide you with a simple table to help with this. If you are using the spirometer at home, follow these instructions: [place] IF:   You are having difficultly using the spirometer.  You have trouble using the spirometer as often as instructed.  Your pain medication is not giving enough relief while using the spirometer.  You develop fever of 100.5 F (38.1 C) or higher. SEEK IMMEDIATE MEDICAL CARE IF:   You cough up bloody sputum that had not been present before.  You develop fever of 102 F (38.9 C) or greater.  You develop worsening pain at or near the incision site. MAKE SURE YOU:   Understand these instructions.  Will watch your condition.  Will get help right away if you are not doing well or get worse. Document Released: 05/09/2006 Document Revised: 03/21/2011 Document Reviewed: 07/10/2006 ExitCare Patient Information 2014 ExitCare, [place].   ________________________________________________________________________  WHAT IS A BLOOD TRANSFUSION? Blood Transfusion Information  A transfusion is the replacement of blood or some of its parts. Blood is made up of multiple cells which provide different functions.  Red blood cells carry oxygen and are used for blood loss replacement.  White blood cells fight against infection.  Platelets control bleeding.  Plasma helps clot blood.  Other blood products are available for specialized needs, such as hemophilia or other clotting disorders. BEFORE THE TRANSFUSION  Who gives blood for transfusions?   Healthy volunteers who are fully evaluated to make sure their blood is safe. This is blood bank blood. Transfusion therapy is the safest it has ever been in the practice of medicine. Before blood is taken from a donor, a complete history is taken to make sure that person has no  history of diseases  nor engages in risky social behavior (examples are intravenous drug use or sexual activity with multiple partners). The donor's travel history is screened to minimize risk of transmitting infections, such as malaria. The donated blood is tested for signs of infectious diseases, such as HIV and hepatitis. The blood is then tested to be sure it is compatible with you in order to minimize the chance of a transfusion reaction. If you or a relative donates blood, this is often done in anticipation of surgery and is not appropriate for emergency situations. It takes many days to process the donated blood. RISKS AND COMPLICATIONS Although transfusion therapy is very safe and saves many lives, the main dangers of transfusion include:   Getting an infectious disease.  Developing a transfusion reaction. This is an allergic reaction to something in the blood you were given. Every precaution is taken to prevent this. The decision to have a blood transfusion has been considered carefully by your caregiver before blood is given. Blood is not given unless the benefits outweigh the risks. AFTER THE TRANSFUSION  Right after receiving a blood transfusion, you will usually feel much better and more energetic. This is especially true if your red blood cells have gotten low (anemic). The transfusion raises the level of the red blood cells which carry oxygen, and this usually causes an energy increase.  The nurse administering the transfusion will monitor you carefully for complications. HOME CARE INSTRUCTIONS  No special instructions are needed after a transfusion. You may find your energy is better. Speak with your caregiver about any limitations on activity for underlying diseases you may have. SEEK MEDICAL CARE IF:   Your condition is not improving after your transfusion.  You develop redness or irritation at the intravenous (IV) site. SEEK IMMEDIATE MEDICAL CARE IF:  Any of the following symptoms occur over the  next 12 hours:  Shaking chills.  You have a temperature by mouth above 102 F (38.9 C), not controlled by medicine.  Chest, back, or muscle pain.  People around you feel you are not acting correctly or are confused.  Shortness of breath or difficulty breathing.  Dizziness and fainting.  You get a rash or develop hives.  You have a decrease in urine output.  Your urine turns a dark color or changes to pink, red, or brown. Any of the following symptoms occur over the next 10 days:  You have a temperature by mouth above 102 F (38.9 C), not controlled by medicine.  Shortness of breath.  Weakness after normal activity.  The white part of the eye turns yellow (jaundice).  You have a decrease in the amount of urine or are urinating less often.  Your urine turns a dark color or changes to pink, red, or brown. Document Released: 12/25/1999 Document Revised: 03/21/2011 Document Reviewed: 08/13/2007 [facility] Patient Information 2014 ExitCare, [place].  ____ Copy given to patient and instructed that she will have labs drawn the morning of surgery.

## 2017-12-21 ENCOUNTER — Other Ambulatory Visit: Payer: Self-pay

## 2017-12-21 ENCOUNTER — Ambulatory Visit (HOSPITAL_COMMUNITY): Payer: Medicare Other

## 2017-12-21 ENCOUNTER — Ambulatory Visit (HOSPITAL_COMMUNITY): Payer: Medicare Other | Admitting: Anesthesiology

## 2017-12-21 ENCOUNTER — Ambulatory Visit (HOSPITAL_COMMUNITY)
Admission: RE | Admit: 2017-12-21 | Discharge: 2017-12-21 | Disposition: A | Discharge status: Home / Self Care | Payer: Medicare Other | Attending: Urology | Admitting: Urology

## 2017-12-21 ENCOUNTER — Encounter (HOSPITAL_COMMUNITY)
Admission: RE | Disposition: A | Discharge status: Home / Self Care | Payer: Self-pay | Source: Home / Self Care | Attending: Urology

## 2017-12-21 DIAGNOSIS — E039 Hypothyroidism, unspecified: Secondary | ICD-10-CM | POA: Insufficient documentation

## 2017-12-21 DIAGNOSIS — Z952 Presence of prosthetic heart valve: Secondary | ICD-10-CM | POA: Insufficient documentation

## 2017-12-21 DIAGNOSIS — E785 Hyperlipidemia, unspecified: Secondary | ICD-10-CM | POA: Insufficient documentation

## 2017-12-21 DIAGNOSIS — I10 Essential (primary) hypertension: Secondary | ICD-10-CM | POA: Insufficient documentation

## 2017-12-21 DIAGNOSIS — I2510 Atherosclerotic heart disease of native coronary artery without angina pectoris: Secondary | ICD-10-CM | POA: Insufficient documentation

## 2017-12-21 DIAGNOSIS — D49511 Neoplasm of unspecified behavior of right kidney: Secondary | ICD-10-CM | POA: Insufficient documentation

## 2017-12-21 DIAGNOSIS — F419 Anxiety disorder, unspecified: Secondary | ICD-10-CM | POA: Insufficient documentation

## 2017-12-21 DIAGNOSIS — Z79890 Hormone replacement therapy (postmenopausal): Secondary | ICD-10-CM | POA: Insufficient documentation

## 2017-12-21 DIAGNOSIS — Z79899 Other long term (current) drug therapy: Secondary | ICD-10-CM | POA: Insufficient documentation

## 2017-12-21 DIAGNOSIS — K219 Gastro-esophageal reflux disease without esophagitis: Secondary | ICD-10-CM | POA: Insufficient documentation

## 2017-12-21 DIAGNOSIS — Z951 Presence of aortocoronary bypass graft: Secondary | ICD-10-CM | POA: Insufficient documentation

## 2017-12-21 DIAGNOSIS — Z7982 Long term (current) use of aspirin: Secondary | ICD-10-CM | POA: Insufficient documentation

## 2017-12-21 DIAGNOSIS — Z7902 Long term (current) use of antithrombotics/antiplatelets: Secondary | ICD-10-CM | POA: Insufficient documentation

## 2017-12-21 DIAGNOSIS — I739 Peripheral vascular disease, unspecified: Secondary | ICD-10-CM | POA: Insufficient documentation

## 2017-12-21 DIAGNOSIS — F329 Major depressive disorder, single episode, unspecified: Secondary | ICD-10-CM | POA: Insufficient documentation

## 2017-12-21 DIAGNOSIS — E7800 Pure hypercholesterolemia, unspecified: Secondary | ICD-10-CM | POA: Insufficient documentation

## 2017-12-21 HISTORY — PX: CYSTOSCOPY/RETROGRADE/URETEROSCOPY: SHX5316

## 2017-12-21 SURGERY — CYSTOSCOPY/RETROGRADE/URETEROSCOPY
Anesthesia: General | Laterality: Right

## 2017-12-21 MED ORDER — PROPOFOL 10 MG/ML IV BOLUS
INTRAVENOUS | Status: AC
  Filled 2017-12-21: qty 20

## 2017-12-21 MED ORDER — SCOPOLAMINE 1 MG/3DAYS TD PT72
MEDICATED_PATCH | TRANSDERMAL | Status: DC | PRN
  Administered 2017-12-21: 1 via TRANSDERMAL

## 2017-12-21 MED ORDER — FENTANYL CITRATE (PF) 100 MCG/2ML IJ SOLN
INTRAMUSCULAR | Status: AC
  Filled 2017-12-21: qty 2

## 2017-12-21 MED ORDER — DEXAMETHASONE SODIUM PHOSPHATE 4 MG/ML IJ SOLN
INTRAMUSCULAR | Status: DC | PRN
  Administered 2017-12-21: 8 mg via INTRAVENOUS

## 2017-12-21 MED ORDER — FENTANYL CITRATE (PF) 100 MCG/2ML IJ SOLN
INTRAMUSCULAR | Status: DC | PRN
  Administered 2017-12-21: 50 ug via INTRAVENOUS

## 2017-12-21 MED ORDER — SCOPOLAMINE 1 MG/3DAYS TD PT72
MEDICATED_PATCH | TRANSDERMAL | Status: AC
  Filled 2017-12-21: qty 1

## 2017-12-21 MED ORDER — CEFAZOLIN SODIUM-DEXTROSE 2-4 GM/100ML-% IV SOLN
2.0000 g | Freq: Once | INTRAVENOUS | Status: AC
  Administered 2017-12-21: 2 g via INTRAVENOUS

## 2017-12-21 MED ORDER — ONDANSETRON HCL 4 MG/2ML IJ SOLN
INTRAMUSCULAR | Status: DC | PRN
  Administered 2017-12-21: 4 mg via INTRAVENOUS

## 2017-12-21 MED ORDER — MIDAZOLAM HCL 2 MG/2ML IJ SOLN
INTRAMUSCULAR | Status: DC | PRN
  Administered 2017-12-21: 1 mg via INTRAVENOUS

## 2017-12-21 MED ORDER — SODIUM CHLORIDE 0.9 % IR SOLN
Status: DC | PRN
  Administered 2017-12-21: 3000 mL via INTRAVESICAL

## 2017-12-21 MED ORDER — LIDOCAINE 2% (20 MG/ML) 5 ML SYRINGE
INTRAMUSCULAR | Status: AC
  Filled 2017-12-21: qty 5

## 2017-12-21 MED ORDER — MIDAZOLAM HCL 2 MG/2ML IJ SOLN
INTRAMUSCULAR | Status: AC
  Filled 2017-12-21: qty 2

## 2017-12-21 MED ORDER — IOHEXOL 300 MG/ML  SOLN
INTRAMUSCULAR | Status: DC | PRN
  Administered 2017-12-21: 6 mL via URETHRAL

## 2017-12-21 MED ORDER — LACTATED RINGERS IV SOLN
INTRAVENOUS | Status: DC
  Administered 2017-12-21: 14:00:00 via INTRAVENOUS

## 2017-12-21 MED ORDER — CEFAZOLIN SODIUM-DEXTROSE 2-4 GM/100ML-% IV SOLN
INTRAVENOUS | Status: AC
  Filled 2017-12-21: qty 100

## 2017-12-21 MED ORDER — LIDOCAINE 2% (20 MG/ML) 5 ML SYRINGE
INTRAMUSCULAR | Status: DC | PRN
  Administered 2017-12-21: 80 mg via INTRAVENOUS

## 2017-12-21 MED ORDER — PROPOFOL 10 MG/ML IV BOLUS
INTRAVENOUS | Status: DC | PRN
  Administered 2017-12-21: 100 mg via INTRAVENOUS

## 2017-12-21 SURGICAL SUPPLY — 19 items
BAG URO CATCHER STRL LF (MISCELLANEOUS) ×3 IMPLANT
BASKET ZERO TIP NITINOL 2.4FR (BASKET) IMPLANT
CATH INTERMIT  6FR 70CM (CATHETERS) ×3 IMPLANT
CLOTH BEACON ORANGE TIMEOUT ST (SAFETY) ×3 IMPLANT
COVER WAND RF STERILE (DRAPES) IMPLANT
FIBER LASER FLEXIVA 365 (UROLOGICAL SUPPLIES) IMPLANT
FIBER LASER TRAC TIP (UROLOGICAL SUPPLIES) IMPLANT
GLOVE BIOGEL M STRL SZ7.5 (GLOVE) ×9 IMPLANT
GOWN STRL REUS W/TWL LRG LVL3 (GOWN DISPOSABLE) ×6 IMPLANT
GUIDEWIRE ANG ZIPWIRE 038X150 (WIRE) IMPLANT
GUIDEWIRE STR DUAL SENSOR (WIRE) ×3 IMPLANT
IV NS 1000ML (IV SOLUTION) ×2
IV NS 1000ML BAXH (IV SOLUTION) ×1 IMPLANT
MANIFOLD NEPTUNE II (INSTRUMENTS) ×3 IMPLANT
PACK CYSTO (CUSTOM PROCEDURE TRAY) ×3 IMPLANT
SHEATH URETERAL 12FRX35CM (MISCELLANEOUS) IMPLANT
TUBING CONNECTING 10 (TUBING) ×2 IMPLANT
TUBING CONNECTING 10' (TUBING) ×1
TUBING UROLOGY SET (TUBING) ×3 IMPLANT

## 2017-12-21 NOTE — Interval H&P Note (Signed)
History and Physical Interval Note:  12/21/2017 4:31 PM  [name]  has presented today for surgery, with the diagnosis of RIGHT RENAL NEOPLASM  The various methods of treatment have been discussed with the patient and family. After consideration of risks, benefits and other options for treatment, the patient has consented to  Procedure(s): CYSTOSCOPY/RETROGRADE/URETEROSCOPY/BIOPSY (Right) as a surgical intervention .  The patient's history has been reviewed, patient examined, no change in status, stable for surgery.  I have reviewed the patient's chart and labs.  Questions were answered to the patient's satisfaction.     Terrilee Dudzik,LES

## 2017-12-21 NOTE — Anesthesia Postprocedure Evaluation (Signed)
Anesthesia Post Note  Patient: [name]  Procedure(s) Performed: CYSTOSCOPY/RETROGRADE (Right )     Patient location during evaluation: PACU Anesthesia Type: General Level of consciousness: awake and alert Pain management: pain level controlled Vital Signs Assessment: post-procedure vital signs reviewed and stable Respiratory status: spontaneous breathing, nonlabored ventilation, respiratory function stable and patient connected to nasal cannula oxygen Cardiovascular status: blood pressure returned to baseline and stable Postop Assessment: no apparent nausea or vomiting Anesthetic complications: no    Last Vitals:  Vitals:   12/21/17 1815 12/21/17 1830  BP: 131/64 133/67  Pulse: 64 64  Resp: 17 17  Temp:    SpO2: 100% 97%    Last Pain:  Vitals:   12/21/17 1359  TempSrc: Oral                 [name]

## 2017-12-21 NOTE — Transfer of Care (Signed)
Immediate Anesthesia Transfer of Care Note  Patient: [name]  Procedure(s) Performed: CYSTOSCOPY/RETROGRADE (Right )  Patient Location: PACU  Anesthesia Type:General  Level of Consciousness: awake, alert , oriented and patient cooperative  Airway & Oxygen Therapy: Patient Spontanous Breathing and Patient connected to face mask oxygen  Post-op Assessment: Report given to RN and Post -op Vital signs reviewed and stable  Post vital signs: Reviewed and stable  Last Vitals:  Vitals Value Taken Time  BP 140/67 12/21/2017  6:07 PM  Temp    Pulse 65 12/21/2017  6:12 PM  Resp 21 12/21/2017  6:12 PM  SpO2 100 % 12/21/2017  6:12 PM  Vitals shown include unvalidated device data.  Last Pain:  Vitals:   12/21/17 1359  TempSrc: Oral         Complications: No apparent anesthesia complications

## 2017-12-21 NOTE — Anesthesia Procedure Notes (Signed)
Procedure Name: LMA Insertion Date/Time: 12/21/2017 5:42 PM Performed by: West Pugh, CRNA Pre-anesthesia Checklist: Patient identified, Emergency Drugs available, Suction available, Patient being monitored and Timeout performed Patient Re-evaluated:Patient Re-evaluated prior to induction Oxygen Delivery Method: Circle system utilized Preoxygenation: Pre-oxygenation with 100% oxygen Induction Type: IV induction LMA: LMA with gastric port inserted LMA Size: 4.0 Number of attempts: 1 Placement Confirmation: positive ETCO2 and breath sounds checked- equal and bilateral Tube secured with: Tape Dental Injury: Teeth and Oropharynx as per pre-operative assessment

## 2017-12-21 NOTE — Discharge Instructions (Addendum)
1. You may see some blood in the urine and may have some burning with urination for 48-72 hours. You also may notice that you have to urinate more frequently or urgently after your procedure which is normal.  °2. You should call should you develop an inability urinate, fever > 101, persistent nausea and vomiting that prevents you from eating or drinking to stay hydrated.  °

## 2017-12-21 NOTE — Anesthesia Preprocedure Evaluation (Addendum)
Anesthesia Evaluation  Patient identified by MRN, date of birth, ID band Patient awake    Reviewed: Allergy & Precautions, NPO status , Patient's Chart, lab work & pertinent test results  History of Anesthesia Complications (+) PONV and history of anesthetic complications  Airway Mallampati: II  TM Distance: >3 FB Neck ROM: Full    Dental no notable dental hx. (+) Teeth Intact, Dental Advisory Given   Pulmonary neg pulmonary ROS, Current Smoker,    Pulmonary exam normal breath sounds clear to auscultation       Cardiovascular hypertension, Pt. 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  LHC 03/2017 prior to TAVR Ost LAD to Prox LAD lesion is 100% stenosed. Prox LAD to Mid LAD lesion is 100% stenosed. Ost Cx to Mid Cx lesion is 100% stenosed. Prox RCA to Mid RCA lesion is 100% stenosed. LIMA graft was visualized by angiography and is normal in caliber. The graft exhibits no disease. Seq SVG- OM 1 and OM 2 graft was visualized by angiography and is large. SVG graft was visualized by angiography and is normal in caliber. The graft exhibits no disease. Dist Graft lesion is 30% stenosed. Origin lesion is 35% stenosed. There is severe aortic valve stenosis. LV end diastolic pressure is mildly elevated. Hemodynamic findings consistent with mild pulmonary hypertension. 1. Severe 3 vessel occlusive CAD 2. Patent LIMA to the mid LAD. This only supplies a septal perforator.  3. Patent SVG to mid to distal LAD 4. Patent sequential SVG to OM 1 and OM 2 5. Severe Aortic stenosis. Mean gradient 32 mm Hg, peak gradient 35 mm Hg. Valve area 1.1 cm  squared. Index 0.56 6. Normal cardiac output 7. Mild pulmonary HTN with mildly elevated LV filling pressures.     Neuro/Psych PSYCHIATRIC DISORDERS Depression negative neurological ROS     GI/Hepatic Neg liver ROS, GERD  Medicated,  Endo/Other  negative endocrine ROS  Renal/GU negative Renal ROS  negative genitourinary   Musculoskeletal negative musculoskeletal ROS (+)   Abdominal   Peds  Hematology negative hematology ROS (+)   Anesthesia Other Findings Right renal neoplasm  Reproductive/Obstetrics                            Anesthesia Physical Anesthesia Plan  ASA: III  Anesthesia Plan: General   Post-op Pain Management:    Induction: Intravenous  PONV Risk Score and Plan: 3 and Treatment may vary due to age or medical condition, Ondansetron and Dexamethasone  Airway Management Planned: LMA and Oral ETT  Additional Equipment:   Intra-op Plan:   Post-operative Plan: Extubation in OR  Informed Consent: I have reviewed the patients History and Physical, chart, labs and discussed the procedure including the risks, benefits and alternatives for the proposed anesthesia with the patient or authorized representative who has indicated his/her understanding and acceptance.   Dental advisory given  Plan Discussed with: CRNA  Anesthesia Plan Comments:         Anesthesia Quick Evaluation

## 2017-12-21 NOTE — Op Note (Signed)
Preoperative diagnosis: Right renal neoplasm  Postoperative diagnosis: Right renal neoplasm  Procedure: Cystoscopy and right retrograde pyelogram  Surgeon: Pryor Curia. MD  Anesthesia: General  Complications: None  EBL: None  Intraoperative findings: Right retrograde pyelography was performed with a 6 French ureteral catheter and Omnipaque contrast.  This demonstrated a normal caliber ureter without filling defects or other abnormalities.  No hydronephrosis or filling defects were noted within the renal collecting system.  There was noted to be some splaying of the renal calyces consistent with her known renal tumor.  Indication: [name] is a 77 year old female who was incidentally found to have an enhancing right renal neoplasm concerning for renal cell carcinoma.  This mass was noted to be central and did raise the possibility of urothelial carcinoma as well.  For this reason, she presents today to undergo cystoscopy and retrograde pyelography with further evaluation if necessary.  We discussed the potential risks, complications, and the expected recovery process associated with this procedure.  Informed consent was obtained.  Description of procedure: The patient was taken to the operating room and a general anesthetic was administered.  She was given preoperative antibiotics, placed in the dorsolithotomy position, and prepped and draped in the usual sterile fashion.  Next, a preoperative timeout was performed.  Cystourethroscopy was performed.  The bladder was examined systematically and there was no evidence of any bladder tumors, stones, or other mucosal pathology.  The ureteral orifices were in their expected anatomic location and were effluxing clear urine.  Attention then turned to the right ureteral orifice.  This was intubated with a 6 French ureteral catheter and Omnipaque contrast was injected.  Findings are as described above.  No significant abnormalities were noted.   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  The patient tolerated the procedure well without complications.  Her bladder was emptied.  She was able to be awakened and transferred to recovery in satisfactory condition.

## 2017-12-22 ENCOUNTER — Encounter (HOSPITAL_COMMUNITY): Payer: Self-pay | Admitting: Urology

## 2017-12-26 ENCOUNTER — Inpatient Hospital Stay (HOSPITAL_COMMUNITY)
Admission: RE | Admit: 2017-12-26 | Discharge: 2017-12-26 | Disposition: A | Discharge status: Home / Self Care | Payer: Medicare Other | Source: Ambulatory Visit

## 2017-12-27 NOTE — H&P (Signed)
Office Visit Report     12/06/2017   --------------------------------------------------------------------------------   [name]  MRN: [id number]  PRIMARY CARE:  Mindy Reading, MD  DOB: October 29, 1940, 76 year old Female  REFERRING:  [name]. Mindy Manns, MD  SSN:   PROVIDER:  Raynelle Bring, M.D.    LOCATION:  [facility] 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 She was noted to have stable coronary artery disease that appeared similar to her catheterization from 2008. However, she had had progression of her aortic stenosis and was referred to Dr. Roxy [name] and underwent transcatheter aortic valve replacement on 05/09/2017. She has done quite well and has recovered uneventfully. As part of her evaluation, she did undergo a CT angiogram. Incidentally, she was found to have what appears to be a 4.1 cm enhancing centrally located, interpolar right renal neoplasm raising suspicion for possible renal malignancy. No regional lymphadenopathy or evidence of metastatic disease was noted. A chest CT did not demonstrate any pulmonary nodules or concerning findings to suggest metastatic disease. Notably, this mass was not present on her prior CT scan in 2013. Her follow up echocardiogram after her TAVR was satisfactory and she was seen by Dr. Martinique in July who felt she could safely discontinue Plavix on Nov 1 and proceed with renal surgery as needed. Since her last visit with me, she has been doing quite well following her aortic valve replacement. She denies any shortness of breath or chest pain since her last visit with Dr. Martinique in July.    Family history of kidney cancer: None  Family history of ESRD: None   Imaging: (04/18/17) - A CT angiogram of the chest and abdomen demonstrated a hyperdense  lesion of the right kidney located in the interpolar region. This does not distort the normal shape of the kidney.  Side of renal neoplasm: Right  Size of renal neoplasm: 4.1 cm  Location of renal neoplasm: Interpolar, central/perihilar  Exophytic or endophytic: Endophytic (100%)  Renal nephrometry score: 11x   Renal artery anatomy: Two renal arteries  Renal vein anatomy: Single renal vein   Contralateral renal lesions: None.  Regional lymphadenopathy: None.  Adrenal masses: None.  Renal vein/IVC involvement: No.  Metastatic disease to the abdomen: No.   Chest imaging: 04/18/17 - CT chest - negative  LFTs: Normal.   Baseline renal function: Cr 1.05, eGFR 59 ml/min   PMH: Past medical history is significant for coronary artery disease, aortic stenosis status post valve replacement, hyperlipidemia, hypertension, hypothyroidism, depression, arthritis, anxiety, and esophageal reflux disease.  PSH: She has undergone coronary artery bypass grafting in 1985 and 2001. She has had a laparoscopic gastric bypass and cholecystectomy in 2004. Her transcatheter aortic valve replacement was performed in April 2019.     ALLERGIES: Chantix Persantine    MEDICATIONS: Aspirin  Hydrochlorothiazide  Levothyroxine Sodium  Plavix  Simvastatin  Amlodipine Besylate  Bystolic  Irbesartan  Protonix  Prozac     [name] PSH: Locm 300-399Mg/Ml Iodine,1Ml - 11/27/2017, 05/24/2017      PSH Notes: TAVR   NON-[name] PSH: Cholecystectomy (laparoscopic) Coronary Artery Bypass Grafting Gastric Bypass For Obesity    [name] PMH: Right renal neoplasm - 05/16/2017    NON-[name] PMH: Anxiety Arthritis Depression GERD Heart disease, unspecified Hypercholesterolemia Hypertension Hypothyroidism    FAMILY HISTORY: 2 daughters - Runs in Family pancreatic cancer - Mother stroke -  Father   SOCIAL HISTORY: Marital Status: Married Preferred Language: English; Ethnicity: Not Hispanic Or Latino; Race: White Current  Smoking Status: Patient does not smoke anymore.   Tobacco Use Assessment Completed: Used Tobacco in last 30 days? Does drink.  Drinks 3 caffeinated drinks per day.    REVIEW OF SYSTEMS:    [name] Review Female:   Patient reports frequent urination and get up at night to urinate. Patient denies hard to postpone urination, burning /pain with urination, leakage of urine, stream starts and stops, trouble starting your stream, have to strain to urinate, and currently pregnant.  Gastrointestinal (Upper):   Patient denies nausea and vomiting.  Gastrointestinal (Lower):   Patient denies diarrhea and constipation.  Constitutional:   Patient denies weight loss, fatigue, fever, and night sweats.  Skin:   Patient denies skin rash/ lesion and itching.  Eyes:   Patient denies blurred vision and double vision.  Ears/ Nose/ Throat:   Patient denies sore throat and sinus problems.  Hematologic/Lymphatic:   Patient denies swollen glands and easy bruising.  Cardiovascular:   Patient denies leg swelling and chest pains.  Respiratory:   Patient denies cough and shortness of breath.  Endocrine:   Patient denies excessive thirst.  Musculoskeletal:   Patient denies back pain and joint pain.  Neurological:   Patient denies headaches and dizziness.  Psychologic:   Patient denies depression and anxiety.   VITAL SIGNS:      12/06/2017 01:33 PM  Weight 209 lb / 94.8 kg  Height 67 in / 170.18 cm  BP 114/68 mmHg  Pulse 62 /min  BMI 32.7 kg/m   MULTI-SYSTEM PHYSICAL EXAMINATION:    Constitutional: Well-nourished. No physical deformities. Normally developed. Good grooming.  Neck: Neck symmetrical, not swollen. Normal tracheal position.  Respiratory: No labored breathing, no use of accessory muscles. Clear bilaterally.  Cardiovascular: Normal temperature, normal extremity pulses, no swelling, no varicosities. Regular rate and rhythm.  Lymphatic: No enlargement of neck, axillae, groin.  Skin: No paleness, no  jaundice, no cyanosis. No lesion, no ulcer, no rash.  Neurologic / Psychiatric: Oriented to time, oriented to place, oriented to person. No depression, no anxiety, no agitation.  Gastrointestinal: Soft, nondistended, nontender. Prior healed laparoscopic incisions in the upper abdomen noted. No abdominal masses.  Eyes: Normal conjunctivae. Normal eyelids.  Ears, Nose, Mouth, and Throat: Left ear no scars, no lesions, no masses. Right ear no scars, no lesions, no masses. Nose no scars, no lesions, no masses. Normal hearing. Normal lips.  Musculoskeletal: Normal gait and station of head and neck.     PAST DATA REVIEWED:  Source Of History:  Patient  Records Review:   Previous Patient Records  Urine Test Review:   Urinalysis  X-Ray Review: C.T. Chest/ Abd/Pelvis: Reviewed Films.    Notes:                     I have independently reviewed her recent CT scan of the chest, abdomen, and pelvis. She has no evidence to suggest metastatic disease to the chest. The enhancing right renal tumor remains completely endophytic extending toward the renal pelvis. This now measures 4.9 cm in largest diameter increased from 4.5 cm last spring. There are no pathologically enlarged regional lymph nodes, evidence of renal vein or IVC involvement, new contralateral renal lesions, or evidence of metastatic disease to the abdomen.   PROCEDURES:          Urinalysis w/Scope Dipstick Dipstick Cont'd Micro  Color: Yellow Bilirubin:  Neg mg/dL WBC/hpf: 0 - 5/hpf  Appearance: Clear Ketones: Neg mg/dL RBC/hpf: NS (Not Seen)  Specific Gravity: 1.025 Blood: Neg ery/uL Bacteria: NS (Not Seen)  pH: <=5.0 Protein: Neg mg/dL Cystals: NS (Not Seen)  Glucose: Neg mg/dL Urobilinogen: 1.0 mg/dL Casts: NS (Not Seen)    Nitrites: Neg Trichomonas: Not Present    Leukocyte Esterase: Trace leu/uL Mucous: Present      Epithelial Cells: 0 - 5/hpf      Yeast: NS (Not Seen)      Sperm: Not Present    Notes: microscopic not concentrated     ASSESSMENT:      ICD-10 Details  1 [name]:   Right renal neoplasm - D49.511    PLAN:           Orders Labs CBC with Diff, CMP  X-Rays: Chest X-Ray Outside.  X-Ray Notes: ...          Schedule Return Visit/Planned Activity: Other See Visit Notes             Note: Will call to schedule surgery          Document Letter(s):  Created for Patient: Clinical Summary         Notes:   1. Right renal neoplasm concerning for malignancy: She is now off dual anti-platelet therapy in continues aspirin 81 mg. She does wish to proceed with a right laparoscopic radical nephrectomy as previously discussed and may continue aspirin 81 mg perioperatively. Considering the characterization of this tumor, there is a possibility this could represent a urothelial carcinoma and I have recommended that we proceed with cystoscopy and retrograde pyelography with possible ureteroscopy and biopsy if indicated depending on intraoperative findings. This will be scheduled prior to her nephrectomy. 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   We have discussed the risks of treatment in detail including but not limited to bleeding, infection, heart attack, stroke, death, venothromoboembolism, cancer recurrence, injury/damage to surrounding organs and structures, urine leak, the possibility of open surgical conversion for patients undergoing minimally invasive surgery, the risk of developing chronic kidney disease and its associated implications, and the potential risk of end stage renal disease possibly necessitating dialysis.   CC: Dr. Veronia Beets  Dr. Peter Martinique  Dr. Darylene Price     E & M CODE: I spent at least 42 minutes face to face with the patient, more than 50% of that time was spent on counseling and/or coordinating care.     * Signed by Raynelle Bring, M.D. on 12/06/17 at 8:37 PM  (EST)*

## 2017-12-27 NOTE — Progress Notes (Signed)
SPOKE WITH PATIENT TO INFORM OF CHANGE IN TIME OF SURGERY. PATIENT AWARE TO CHECK IN AT ADMITTING AT 0915. TOTAL NPO AFTER MIDNIGHT . SIPS OF WATER WITH AM MEDS OK. PATIENT VERBALIZED UNDERSTANDING

## 2017-12-28 ENCOUNTER — Other Ambulatory Visit: Payer: Self-pay

## 2017-12-28 ENCOUNTER — Inpatient Hospital Stay (HOSPITAL_COMMUNITY)
Admission: RE | Admit: 2017-12-28 | Discharge: 2017-12-31 | DRG: 658 | Disposition: A | Discharge status: Home / Self Care | Payer: Medicare Other | Attending: Urology | Admitting: Urology

## 2017-12-28 ENCOUNTER — Inpatient Hospital Stay (HOSPITAL_COMMUNITY): Payer: Medicare Other | Admitting: Registered Nurse

## 2017-12-28 ENCOUNTER — Encounter (HOSPITAL_COMMUNITY): Payer: Self-pay

## 2017-12-28 ENCOUNTER — Encounter (HOSPITAL_COMMUNITY)
Admission: RE | Disposition: A | Discharge status: Home / Self Care | Payer: Self-pay | Source: Home / Self Care | Attending: Urology

## 2017-12-28 DIAGNOSIS — F329 Major depressive disorder, single episode, unspecified: Secondary | ICD-10-CM | POA: Diagnosis present

## 2017-12-28 DIAGNOSIS — K219 Gastro-esophageal reflux disease without esophagitis: Secondary | ICD-10-CM | POA: Diagnosis present

## 2017-12-28 DIAGNOSIS — E039 Hypothyroidism, unspecified: Secondary | ICD-10-CM | POA: Diagnosis present

## 2017-12-28 DIAGNOSIS — Z888 Allergy status to other drugs, medicaments and biological substances status: Secondary | ICD-10-CM | POA: Diagnosis not present

## 2017-12-28 DIAGNOSIS — D49511 Neoplasm of unspecified behavior of right kidney: Secondary | ICD-10-CM | POA: Diagnosis present

## 2017-12-28 DIAGNOSIS — I2510 Atherosclerotic heart disease of native coronary artery without angina pectoris: Secondary | ICD-10-CM | POA: Diagnosis present

## 2017-12-28 DIAGNOSIS — Z87891 Personal history of nicotine dependence: Secondary | ICD-10-CM | POA: Diagnosis not present

## 2017-12-28 DIAGNOSIS — E785 Hyperlipidemia, unspecified: Secondary | ICD-10-CM | POA: Diagnosis present

## 2017-12-28 DIAGNOSIS — Z7982 Long term (current) use of aspirin: Secondary | ICD-10-CM

## 2017-12-28 DIAGNOSIS — C641 Malignant neoplasm of right kidney, except renal pelvis: Principal | ICD-10-CM | POA: Diagnosis present

## 2017-12-28 DIAGNOSIS — Z7951 Long term (current) use of inhaled steroids: Secondary | ICD-10-CM | POA: Diagnosis not present

## 2017-12-28 DIAGNOSIS — Z952 Presence of prosthetic heart valve: Secondary | ICD-10-CM | POA: Diagnosis not present

## 2017-12-28 DIAGNOSIS — Z9884 Bariatric surgery status: Secondary | ICD-10-CM

## 2017-12-28 DIAGNOSIS — Z951 Presence of aortocoronary bypass graft: Secondary | ICD-10-CM

## 2017-12-28 DIAGNOSIS — I739 Peripheral vascular disease, unspecified: Secondary | ICD-10-CM | POA: Diagnosis present

## 2017-12-28 DIAGNOSIS — Z79890 Hormone replacement therapy (postmenopausal): Secondary | ICD-10-CM | POA: Diagnosis not present

## 2017-12-28 DIAGNOSIS — I10 Essential (primary) hypertension: Secondary | ICD-10-CM | POA: Diagnosis present

## 2017-12-28 HISTORY — PX: LAPAROSCOPIC NEPHRECTOMY: SHX1930

## 2017-12-28 LAB — HEMOGLOBIN AND HEMATOCRIT, BLOOD
HCT: 38.8 % (ref 36.0–46.0)
Hemoglobin: 12.3 g/dL (ref 12.0–15.0)

## 2017-12-28 LAB — BASIC METABOLIC PANEL
ANION GAP: 11 (ref 5–15)
Anion gap: 10 (ref 5–15)
BUN: 15 mg/dL (ref 8–23)
BUN: 14 mg/dL (ref 8–23)
CO2: 25 mmol/L (ref 22–32)
CO2: 22 mmol/L (ref 22–32)
Calcium: 9.7 mg/dL (ref 8.9–10.3)
Calcium: 9.3 mg/dL (ref 8.9–10.3)
Chloride: 103 mmol/L (ref 98–111)
Chloride: 105 mmol/L (ref 98–111)
Creatinine, Ser: 0.96 mg/dL (ref 0.44–1.00)
Creatinine, Ser: 1.03 mg/dL — ABNORMAL HIGH (ref 0.44–1.00)
GFR calc Af Amer: >60 mL/min (ref >60)
GFR calc Af Amer: >60 mL/min (ref >60)
GFR calc non Af Amer: 58 mL/min — ABNORMAL LOW (ref >60)
GFR calc non Af Amer: 53 mL/min — ABNORMAL LOW (ref >60)
Glucose, Bld: 110 mg/dL — ABNORMAL HIGH (ref 70–99)
Glucose, Bld: 161 mg/dL — ABNORMAL HIGH (ref 70–99)
Potassium: 4.5 mmol/L (ref 3.5–5.1)
Potassium: 4 mmol/L (ref 3.5–5.1)
SODIUM: 137 mmol/L (ref 135–145)
Sodium: 139 mmol/L (ref 135–145)

## 2017-12-28 LAB — ABO/RH: ABO/RH(D): A POS

## 2017-12-28 LAB — TYPE AND SCREEN
ABO/RH(D): A POS
Antibody Screen: NEGATIVE

## 2017-12-28 LAB — CBC
HCT: 37.4 % (ref 36.0–46.0)
HEMOGLOBIN: 11.9 g/dL — AB (ref 12.0–15.0)
MCH: 32.2 pg (ref 26.0–34.0)
MCHC: 31.8 g/dL (ref 30.0–36.0)
MCV: 101.4 fL — ABNORMAL HIGH (ref 80.0–100.0)
Platelets: 129 10*3/uL — ABNORMAL LOW (ref 150–400)
RBC: 3.69 MIL/uL — AB (ref 3.87–5.11)
RDW: 12.5 % (ref 11.5–15.5)
WBC: 6.3 10*3/uL (ref 4.0–10.5)
nRBC: 0 % (ref 0.0–0.2)

## 2017-12-28 SURGERY — NEPHRECTOMY, RADICAL, LAPAROSCOPIC, ADULT
Anesthesia: General | Laterality: Right

## 2017-12-28 MED ORDER — CEFAZOLIN SODIUM-DEXTROSE 2-4 GM/100ML-% IV SOLN
2.0000 g | Freq: Once | INTRAVENOUS | Status: AC
  Administered 2017-12-28: 2 g via INTRAVENOUS
  Filled 2017-12-28: qty 100

## 2017-12-28 MED ORDER — DEXTROSE-NACL 5-0.45 % IV SOLN
INTRAVENOUS | Status: DC
  Administered 2017-12-28 – 2017-12-29 (×3): via INTRAVENOUS

## 2017-12-28 MED ORDER — FUROSEMIDE 20 MG PO TABS: 20.0000 mg | ORAL_TABLET | Freq: Every day | ORAL | Status: DC | PRN

## 2017-12-28 MED ORDER — ONDANSETRON HCL 4 MG/2ML IJ SOLN
INTRAMUSCULAR | Status: DC | PRN
  Administered 2017-12-28: 4 mg via INTRAVENOUS

## 2017-12-28 MED ORDER — NITROGLYCERIN 0.4 MG SL SUBL: 0.4000 mg | SUBLINGUAL_TABLET | SUBLINGUAL | Status: DC | PRN

## 2017-12-28 MED ORDER — LACTATED RINGERS IV SOLN
INTRAVENOUS | Status: DC
  Administered 2017-12-28: 1000 mL via INTRAVENOUS

## 2017-12-28 MED ORDER — TRAMADOL HCL 50 MG PO TABS: 50.0000 mg | ORAL_TABLET | Freq: Four times a day (QID) | ORAL | 0 refills | Status: DC | PRN

## 2017-12-28 MED ORDER — PROPOFOL 10 MG/ML IV BOLUS
INTRAVENOUS | Status: AC
  Filled 2017-12-28: qty 20

## 2017-12-28 MED ORDER — LEVOTHYROXINE SODIUM 50 MCG PO TABS
50.0000 ug | ORAL_TABLET | Freq: Every day | ORAL | Status: DC
  Administered 2017-12-29 – 2017-12-31 (×3): 50 ug via ORAL
  Filled 2017-12-28 (×3): qty 1

## 2017-12-28 MED ORDER — FENTANYL CITRATE (PF) 250 MCG/5ML IJ SOLN
INTRAMUSCULAR | Status: AC
  Filled 2017-12-28: qty 5

## 2017-12-28 MED ORDER — NEBIVOLOL HCL 10 MG PO TABS
20.0000 mg | ORAL_TABLET | Freq: Every day | ORAL | Status: DC
  Administered 2017-12-28 – 2017-12-31 (×4): 20 mg via ORAL
  Filled 2017-12-28 (×5): qty 2

## 2017-12-28 MED ORDER — ALBUTEROL SULFATE (2.5 MG/3ML) 0.083% IN NEBU: 3.0000 mL | INHALATION_SOLUTION | RESPIRATORY_TRACT | Status: DC | PRN

## 2017-12-28 MED ORDER — HEPARIN SODIUM (PORCINE) 1000 UNIT/ML IJ SOLN
INTRAMUSCULAR | Status: AC
  Filled 2017-12-28: qty 1

## 2017-12-28 MED ORDER — ONDANSETRON HCL 4 MG/2ML IJ SOLN
INTRAMUSCULAR | Status: AC
  Administered 2017-12-28: 4 mg
  Filled 2017-12-28: qty 2

## 2017-12-28 MED ORDER — CEFAZOLIN SODIUM-DEXTROSE 1-4 GM/50ML-% IV SOLN
1.0000 g | Freq: Three times a day (TID) | INTRAVENOUS | Status: AC
  Administered 2017-12-28 – 2017-12-29 (×2): 1 g via INTRAVENOUS
  Filled 2017-12-28 (×2): qty 50

## 2017-12-28 MED ORDER — ROCURONIUM BROMIDE 10 MG/ML (PF) SYRINGE
PREFILLED_SYRINGE | INTRAVENOUS | Status: AC
  Filled 2017-12-28: qty 10

## 2017-12-28 MED ORDER — FLUOXETINE HCL 20 MG PO CAPS
40.0000 mg | ORAL_CAPSULE | Freq: Every evening | ORAL | Status: DC
  Administered 2017-12-28 – 2017-12-30 (×3): 40 mg via ORAL
  Filled 2017-12-28 (×3): qty 2

## 2017-12-28 MED ORDER — MAGNESIUM CITRATE PO SOLN: 1.0000 | Freq: Once | ORAL | Status: DC

## 2017-12-28 MED ORDER — ASPIRIN EC 81 MG PO TBEC
81.0000 mg | DELAYED_RELEASE_TABLET | Freq: Every day | ORAL | Status: DC
  Administered 2017-12-28 – 2017-12-30 (×3): 81 mg via ORAL
  Filled 2017-12-28 (×3): qty 1

## 2017-12-28 MED ORDER — ONDANSETRON HCL 4 MG/2ML IJ SOLN
INTRAMUSCULAR | Status: AC
  Filled 2017-12-28: qty 2

## 2017-12-28 MED ORDER — MIDAZOLAM HCL 2 MG/2ML IJ SOLN
INTRAMUSCULAR | Status: AC
  Filled 2017-12-28: qty 2

## 2017-12-28 MED ORDER — BUPIVACAINE-EPINEPHRINE (PF) 0.25% -1:200000 IJ SOLN
INTRAMUSCULAR | Status: DC | PRN
  Administered 2017-12-28: 4 mL

## 2017-12-28 MED ORDER — FENTANYL CITRATE (PF) 100 MCG/2ML IJ SOLN
INTRAMUSCULAR | Status: DC | PRN
  Administered 2017-12-28 (×3): 50 ug via INTRAVENOUS

## 2017-12-28 MED ORDER — HYDROMORPHONE HCL 1 MG/ML IJ SOLN
INTRAMUSCULAR | Status: AC
  Filled 2017-12-28: qty 1

## 2017-12-28 MED ORDER — ALPRAZOLAM 0.25 MG PO TABS
0.2500 mg | ORAL_TABLET | Freq: Three times a day (TID) | ORAL | Status: DC | PRN
  Administered 2017-12-28 – 2017-12-30 (×4): 0.25 mg via ORAL
  Filled 2017-12-28 (×4): qty 1

## 2017-12-28 MED ORDER — SIMVASTATIN 40 MG PO TABS
40.0000 mg | ORAL_TABLET | Freq: Every day | ORAL | Status: DC
  Administered 2017-12-28 – 2017-12-30 (×3): 40 mg via ORAL
  Filled 2017-12-28 (×3): qty 1

## 2017-12-28 MED ORDER — DOCUSATE SODIUM 100 MG PO CAPS
100.0000 mg | ORAL_CAPSULE | Freq: Two times a day (BID) | ORAL | Status: DC
  Administered 2017-12-28 – 2017-12-31 (×6): 100 mg via ORAL
  Filled 2017-12-28 (×6): qty 1

## 2017-12-28 MED ORDER — ROCURONIUM BROMIDE 10 MG/ML (PF) SYRINGE
PREFILLED_SYRINGE | INTRAVENOUS | Status: DC | PRN
  Administered 2017-12-28: 60 mg via INTRAVENOUS

## 2017-12-28 MED ORDER — ONDANSETRON HCL 4 MG/2ML IJ SOLN
4.0000 mg | INTRAMUSCULAR | Status: DC | PRN
  Administered 2017-12-29 – 2017-12-30 (×5): 4 mg via INTRAVENOUS
  Filled 2017-12-28 (×5): qty 2

## 2017-12-28 MED ORDER — SODIUM CHLORIDE 0.9 % IV SOLN
INTRAVENOUS | Status: DC | PRN
  Administered 2017-12-28: 15 ug/min via INTRAVENOUS

## 2017-12-28 MED ORDER — SUGAMMADEX SODIUM 200 MG/2ML IV SOLN
INTRAVENOUS | Status: AC
  Filled 2017-12-28: qty 2

## 2017-12-28 MED ORDER — LIDOCAINE 2% (20 MG/ML) 5 ML SYRINGE
INTRAMUSCULAR | Status: DC | PRN
  Administered 2017-12-28: 80 mg via INTRAVENOUS

## 2017-12-28 MED ORDER — DEXAMETHASONE SODIUM PHOSPHATE 10 MG/ML IJ SOLN
INTRAMUSCULAR | Status: AC
  Filled 2017-12-28: qty 1

## 2017-12-28 MED ORDER — MOMETASONE FURO-FORMOTEROL FUM 200-5 MCG/ACT IN AERO
2.0000 | INHALATION_SPRAY | Freq: Two times a day (BID) | RESPIRATORY_TRACT | Status: DC
  Administered 2017-12-28 – 2017-12-31 (×6): 2 via RESPIRATORY_TRACT
  Filled 2017-12-28: qty 8.8

## 2017-12-28 MED ORDER — SUGAMMADEX SODIUM 200 MG/2ML IV SOLN
INTRAVENOUS | Status: DC | PRN
  Administered 2017-12-28: 200 mg via INTRAVENOUS

## 2017-12-28 MED ORDER — SODIUM CHLORIDE (PF) 0.9 % IJ SOLN
INTRAMUSCULAR | Status: DC | PRN
  Administered 2017-12-28: 20 mL

## 2017-12-28 MED ORDER — EPHEDRINE SULFATE-NACL 50-0.9 MG/10ML-% IV SOSY
PREFILLED_SYRINGE | INTRAVENOUS | Status: DC | PRN
  Administered 2017-12-28 (×2): 5 mg via INTRAVENOUS

## 2017-12-28 MED ORDER — HYDROMORPHONE HCL 1 MG/ML IJ SOLN
0.5000 mg | INTRAMUSCULAR | Status: DC | PRN
  Administered 2017-12-28: 0.5 mg via INTRAVENOUS
  Administered 2017-12-29: 1 mg via INTRAVENOUS
  Filled 2017-12-28: qty 1

## 2017-12-28 MED ORDER — PHENYLEPHRINE HCL 10 MG/ML IJ SOLN
INTRAMUSCULAR | Status: AC
  Filled 2017-12-28: qty 2

## 2017-12-28 MED ORDER — HYDROMORPHONE HCL 1 MG/ML IJ SOLN
0.2500 mg | INTRAMUSCULAR | Status: DC | PRN
  Administered 2017-12-28 (×2): 0.25 mg via INTRAVENOUS

## 2017-12-28 MED ORDER — DEXAMETHASONE SODIUM PHOSPHATE 10 MG/ML IJ SOLN
INTRAMUSCULAR | Status: DC | PRN
  Administered 2017-12-28: 8 mg via INTRAVENOUS

## 2017-12-28 MED ORDER — IRBESARTAN 300 MG PO TABS
300.0000 mg | ORAL_TABLET | Freq: Every day | ORAL | Status: DC
  Administered 2017-12-28 – 2017-12-31 (×4): 300 mg via ORAL
  Filled 2017-12-28 (×4): qty 1

## 2017-12-28 MED ORDER — BUPIVACAINE-EPINEPHRINE (PF) 0.25% -1:200000 IJ SOLN
INTRAMUSCULAR | Status: AC
  Filled 2017-12-28: qty 30

## 2017-12-28 MED ORDER — LIDOCAINE 2% (20 MG/ML) 5 ML SYRINGE
INTRAMUSCULAR | Status: AC
  Filled 2017-12-28: qty 5

## 2017-12-28 MED ORDER — CHLORTHALIDONE 25 MG PO TABS
12.5000 mg | ORAL_TABLET | Freq: Every day | ORAL | Status: DC
  Administered 2017-12-29 – 2017-12-31 (×3): 12.5 mg via ORAL
  Filled 2017-12-28 (×4): qty 0.5

## 2017-12-28 MED ORDER — PANTOPRAZOLE SODIUM 40 MG PO TBEC
40.0000 mg | DELAYED_RELEASE_TABLET | Freq: Every day | ORAL | Status: DC
  Administered 2017-12-29 – 2017-12-31 (×3): 40 mg via ORAL
  Filled 2017-12-28 (×3): qty 1

## 2017-12-28 MED ORDER — MIDAZOLAM HCL 5 MG/5ML IJ SOLN
INTRAMUSCULAR | Status: DC | PRN
  Administered 2017-12-28: 1 mg via INTRAVENOUS

## 2017-12-28 MED ORDER — AMLODIPINE BESYLATE 10 MG PO TABS
10.0000 mg | ORAL_TABLET | Freq: Every day | ORAL | Status: DC
  Administered 2017-12-29 – 2017-12-31 (×3): 10 mg via ORAL
  Filled 2017-12-28 (×3): qty 1

## 2017-12-28 MED ORDER — PROPOFOL 10 MG/ML IV BOLUS
INTRAVENOUS | Status: DC | PRN
  Administered 2017-12-28: 130 mg via INTRAVENOUS

## 2017-12-28 MED ORDER — DIPHENHYDRAMINE HCL 12.5 MG/5ML PO ELIX: 12.5000 mg | ORAL_SOLUTION | Freq: Four times a day (QID) | ORAL | Status: DC | PRN

## 2017-12-28 MED ORDER — BUPIVACAINE LIPOSOME 1.3 % IJ SUSP
20.0000 mL | Freq: Once | INTRAMUSCULAR | Status: AC
  Administered 2017-12-28: 20 mL
  Filled 2017-12-28: qty 20

## 2017-12-28 MED ORDER — DIPHENHYDRAMINE HCL 50 MG/ML IJ SOLN: 12.5000 mg | Freq: Four times a day (QID) | INTRAMUSCULAR | Status: DC | PRN

## 2017-12-28 MED ORDER — SODIUM CHLORIDE (PF) 0.9 % IJ SOLN
INTRAMUSCULAR | Status: AC
  Filled 2017-12-28: qty 20

## 2017-12-28 MED ORDER — ACETAMINOPHEN 10 MG/ML IV SOLN
1000.0000 mg | Freq: Four times a day (QID) | INTRAVENOUS | Status: DC
  Administered 2017-12-28 – 2017-12-29 (×3): 1000 mg via INTRAVENOUS
  Filled 2017-12-28 (×3): qty 100

## 2017-12-28 SURGICAL SUPPLY — 49 items
BAG LAPAROSCOPIC 12 15 PORT 16 (BASKET) ×1 IMPLANT
BAG RETRIEVAL 12/15 (BASKET) ×2
BAG RETRIEVAL 12/15MM (BASKET) ×1
BAG ZIPLOCK 12X15 (MISCELLANEOUS) ×3 IMPLANT
BLADE EXTENDED COATED 6.5IN (ELECTRODE) IMPLANT
BLADE SURG SZ10 CARB STEEL (BLADE) ×3 IMPLANT
CHLORAPREP W/TINT 26ML (MISCELLANEOUS) ×3 IMPLANT
CLIP VESOLOCK LG 6/CT PURPLE (CLIP) ×3 IMPLANT
CLIP VESOLOCK MED LG 6/CT (CLIP) ×3 IMPLANT
CLIP VESOLOCK XL 6/CT (CLIP) ×3 IMPLANT
COVER SURGICAL LIGHT HANDLE (MISCELLANEOUS) ×3 IMPLANT
COVER WAND RF STERILE (DRAPES) ×3 IMPLANT
CUTTER FLEX LINEAR 45M (STAPLE) ×3 IMPLANT
DERMABOND ADVANCED (GAUZE/BANDAGES/DRESSINGS) ×2
DERMABOND ADVANCED .7 DNX12 (GAUZE/BANDAGES/DRESSINGS) ×1 IMPLANT
DRAPE INCISE IOBAN 66X45 STRL (DRAPES) ×3 IMPLANT
DRAPE LAPAROSCOPIC ABDOMINAL (DRAPES) IMPLANT
DRAPE WARM FLUID 44X44 (DRAPE) IMPLANT
ELECT PENCIL ROCKER SW 15FT (MISCELLANEOUS) ×3 IMPLANT
ELECT REM PT RETURN 15FT ADLT (MISCELLANEOUS) ×3 IMPLANT
GLOVE BIO SURGEON STRL SZ 6.5 (GLOVE) ×2 IMPLANT
GLOVE BIO SURGEONS STRL SZ 6.5 (GLOVE) ×1
GLOVE BIOGEL M STRL SZ7.5 (GLOVE) ×3 IMPLANT
GOWN STRL REUS W/TWL LRG LVL3 (GOWN DISPOSABLE) ×6 IMPLANT
HEMOSTAT SURGICEL 2X3 (HEMOSTASIS) ×3 IMPLANT
HEMOSTAT SURGICEL 4X8 (HEMOSTASIS) IMPLANT
HOLDER FOLEY CATH W/STRAP (MISCELLANEOUS) ×3 IMPLANT
IRRIG SUCT STRYKERFLOW 2 WTIP (MISCELLANEOUS) ×3
IRRIGATION SUCT STRKRFLW 2 WTP (MISCELLANEOUS) ×1 IMPLANT
KIT BASIN OR (CUSTOM PROCEDURE TRAY) ×3 IMPLANT
NS IRRIG 1000ML POUR BTL (IV SOLUTION) ×3 IMPLANT
RELOAD 45 VASCULAR/THIN (ENDOMECHANICALS) ×9 IMPLANT
SCISSORS LAP 5X35 DISP (ENDOMECHANICALS) ×3 IMPLANT
SHEARS HARMONIC ACE PLUS 36CM (ENDOMECHANICALS) ×3 IMPLANT
SURGIFLO W/THROMBIN 8M KIT (HEMOSTASIS) IMPLANT
SUT MNCRL AB 4-0 PS2 18 (SUTURE) ×6 IMPLANT
SUT PDS AB 1 CTX 36 (SUTURE) ×6 IMPLANT
SUT VIC AB 2-0 SH 27 (SUTURE) ×2
SUT VIC AB 2-0 SH 27X BRD (SUTURE) ×1 IMPLANT
SUT VICRYL 0 UR6 27IN ABS (SUTURE) ×3 IMPLANT
TOWEL OR 17X26 10 PK STRL BLUE (TOWEL DISPOSABLE) ×3 IMPLANT
TRAY FOLEY CATH 14FR (SET/KITS/TRAYS/PACK) ×3 IMPLANT
TRAY FOLEY MTR SLVR 16FR STAT (SET/KITS/TRAYS/PACK) IMPLANT
TRAY LAPAROSCOPIC (CUSTOM PROCEDURE TRAY) ×3 IMPLANT
TROCAR BLADELESS OPT 5 100 (ENDOMECHANICALS) ×3 IMPLANT
TROCAR XCEL 12X100 BLDLESS (ENDOMECHANICALS) ×3 IMPLANT
TROCAR XCEL BLUNT TIP 100MML (ENDOMECHANICALS) ×3 IMPLANT
TUBING INSUF HEATED (TUBING) IMPLANT
YANKAUER SUCT BULB TIP 10FT TU (MISCELLANEOUS) IMPLANT

## 2017-12-28 NOTE — Anesthesia Postprocedure Evaluation (Signed)
Anesthesia Post Note  Patient: [name]  Procedure(s) Performed: LAPAROSCOPIC RADICAL NEPHRECTOMY (Right )     Patient location during evaluation: PACU Anesthesia Type: General Level of consciousness: awake and alert Pain management: pain level controlled Vital Signs Assessment: post-procedure vital signs reviewed and stable Respiratory status: spontaneous breathing, nonlabored ventilation, respiratory function stable and patient connected to nasal cannula oxygen Cardiovascular status: blood pressure returned to baseline and stable Postop Assessment: no apparent nausea or vomiting Anesthetic complications: no    Last Vitals:  Vitals:   12/28/17 1600 12/28/17 1615  BP: 128/64   Pulse: 65 62  Resp: 20   Temp:    SpO2: 97%     Last Pain:  Vitals:   12/28/17 1600  TempSrc:   PainSc: 4                  Griffen Frayne,W. EDMOND

## 2017-12-28 NOTE — Anesthesia Procedure Notes (Signed)
Procedure Name: Intubation Date/Time: 12/28/2017 12:58 PM Performed by: Victoriano Lain, CRNA Pre-anesthesia Checklist: Patient identified, Emergency Drugs available, Suction available, Patient being monitored and Timeout performed Patient Re-evaluated:Patient Re-evaluated prior to induction Oxygen Delivery Method: Circle system utilized Preoxygenation: Pre-oxygenation with 100% oxygen Induction Type: IV induction Ventilation: Mask ventilation without difficulty Laryngoscope Size: Mac and 4 Grade View: Grade I Tube type: Oral Tube size: 7.5 mm Number of attempts: 1 Airway Equipment and Method: Stylet Placement Confirmation: ETT inserted through vocal cords under direct vision,  positive ETCO2 and breath sounds checked- equal and bilateral Secured at: 22 cm Tube secured with: Tape Dental Injury: Teeth and Oropharynx as per pre-operative assessment

## 2017-12-28 NOTE — Transfer of Care (Signed)
Immediate Anesthesia Transfer of Care Note  Patient: [name]  Procedure(s) Performed: LAPAROSCOPIC RADICAL NEPHRECTOMY (Right )  Patient Location: PACU  Anesthesia Type:General  Level of Consciousness: awake, alert , oriented and patient cooperative  Airway & Oxygen Therapy: Patient Spontanous Breathing and Patient connected to face mask oxygen  Post-op Assessment: Report given to RN, Post -op Vital signs reviewed and stable and Patient moving all extremities  Post vital signs: Reviewed and stable  Last Vitals:  Vitals Value Taken Time  BP 140/56 12/28/2017  3:17 PM  Temp    Pulse 63 12/28/2017  3:20 PM  Resp 22 12/28/2017  3:20 PM  SpO2 100 % 12/28/2017  3:20 PM  Vitals shown include unvalidated device data.  Last Pain:  Vitals:   12/28/17 1026  TempSrc:   PainSc: 0-No pain      Patients Stated Pain Goal: 4 (87/56/43 3295)  Complications: No apparent anesthesia complications

## 2017-12-28 NOTE — Op Note (Signed)
Preoperative diagnosis: Right renal neoplasm  Postoperative diagnosis: Right renal neoplasm  Procedure: 1.  Right laparoscopic radical nephrectomy  Surgeon: Pryor Curia. M.D.  Assistant(s): Debbrah Alar, PA-C  Anesthesia: General  Complications: None  EBL: 50 mL  IVF:  1500 mL crystalloid  Specimens: 1. Right kidney  Disposition of specimens: Pathology  Indication: [name] is a 77 y.o. patient with a right renal tumor suspicious for malignancy.  After a thorough review of the management options for their renal mass, they elected to proceed with surgical treatment and the above procedure.  We have discussed the potential benefits and risks of the procedure, side effects of the proposed treatment, the likelihood of the patient achieving the goals of the procedure, and any potential problems that might occur during the procedure or recuperation. Informed consent has been obtained.  Description of procedure:  The patient was taken to the operating room and a general anesthetic was administered. The patient was given preoperative antibiotics, placed in the right modified flank position, and prepped and draped in the usual sterile fashion. Next a preoperative timeout was performed.  A site was selected near the umbilicus for placement of the camera port. This was placed using a standard open Hassan technique which allowed entry into the peritoneal cavity under direct vision and without difficulty. A 12 mm Hassan cannula was placed and a pneumoperitoneum established. The camera was then used to inspect the abdomen and there was no evidence of any intra-abdominal injuries or other abnormalities. The remaining abdominal ports were then placed. A 12 mm port was placed in the right lower quadrant and a 5 mm port was placed in the right upper quadrant.  All ports were placed under direct vision without difficulty.  Utilizing the harmonic scalpel, the white line of Toldt was incised  allowing the colon to be mobilized medially and the plane between the mesocolon and the anterior layer of Gerota's fascia to be developed and the kidney exposed.  The ureter and gonadal vein were identified inferiorly and the ureter was lifted anteriorly off the psoas muscle. Dissection proceeded superiorly along the gonadal vein until the renal vein was identified. During this dissection, there was noted to be bleeding from the renal vein.  This was controlled with pressure.    Gerota's fascia was intentionally entered superiorly and the space between the adrenal gland and the kidney was developed allowing the adrenal gland to be spared.  The hepatorenal ligaments were divided with the harmonic scalpel. The renal vein was still noted to have bleeding when placed on stretch.  It was decided to take the renal hilum en bloc and so it was ligated and divided with a 45 mm Flex ETS stapler.   The lateral and posterior attachements to the kidney were then divided.  The ureter was ligated with Weck clips and divided allowing the specimen to be freed from all surrounding structures.  The kidney specimen was then placed into a retrieval bag.  The renal hilum, liver, adrenal bed and gonadal vein areas were each inspected and hemostasis was ensured with the pneomperitoneal pressures lowered. A piece of 2 x 3 cm surgicel was placed over the renal hilar stump and IVC. The 12 mm lower quadrant port was then closed with a 0-vicryl suture placed laparoscopically to close the fascia of this incision. All remaining ports were removed under direct vision.  The kidney specimen was removed intact within the retrieval bag via the camera port site after this incision  was extended slightly. This fascial opening was then closed with two #1 PDS sutures.    All incisions were injected with local anesthetic and reapproximated at the skin with 4-0 monocryl sutures.  Dermabond was applied to the skin. The patient tolerated the procedure  well and without complications and was transferred to the recovery unit in satisfactory condition.   Pryor Curia MD

## 2017-12-28 NOTE — Interval H&P Note (Signed)
History and Physical Interval Note:  12/28/2017 11:31 AM  [name]  has presented today for surgery, with the diagnosis of RIGHT RENAL NEOPLASM  The various methods of treatment have been discussed with the patient and family. After consideration of risks, benefits and other options for treatment, the patient has consented to  Procedure(s): LAPAROSCOPIC RADICAL NEPHRECTOMY (Right) as a surgical intervention .  The patient's history has been reviewed, patient examined, no change in status, stable for surgery.  I have reviewed the patient's chart and labs.  Questions were answered to the patient's satisfaction.     Antanasia Kaczynski,LES

## 2017-12-28 NOTE — Progress Notes (Signed)
PACU NURSING NOTE: Order rec from Anesthesiologist to remove left radial arterial line. ALine removed from Lt radial, pressure applied to site for 10 min, no excess bleeding noted. Pt has normal CMS post aline removal, good capillary refill noted, no complaints of numbness or tingling noted, skin warm and dry. Grip strong. MDA at bedside to eval pt prior to DC to tele unit.

## 2017-12-28 NOTE — Anesthesia Preprocedure Evaluation (Addendum)
Anesthesia Evaluation  Patient identified by MRN, date of birth, ID band Patient awake    Reviewed: Allergy & Precautions, H&P , NPO status , Patient's Chart, lab work & pertinent test results, reviewed documented beta blocker date and time   History of Anesthesia Complications (+) PONV  Airway Mallampati: II  TM Distance: >3 FB Neck ROM: Full    Dental no notable dental hx. (+) Teeth Intact, Dental Advisory Given   Pulmonary Current Smoker,    Pulmonary exam normal breath sounds clear to auscultation       Cardiovascular hypertension, Pt. on medications and Pt. on home beta blockers + CAD, + CABG and + Peripheral Vascular Disease   Rhythm:Regular Rate:Normal     Neuro/Psych Depression negative neurological ROS     GI/Hepatic Neg liver ROS, GERD  Medicated and Controlled,  Endo/Other  Hypothyroidism   Renal/GU negative Renal ROS  negative genitourinary   Musculoskeletal   Abdominal   Peds  Hematology negative hematology ROS (+)   Anesthesia Other Findings   Reproductive/Obstetrics negative OB ROS                            Anesthesia Physical Anesthesia Plan  ASA: III  Anesthesia Plan: General   Post-op Pain Management:    Induction: Intravenous  PONV Risk Score and Plan: 4 or greater and Ondansetron, Dexamethasone and Midazolam  Airway Management Planned: Oral ETT  Additional Equipment: Arterial line  Intra-op Plan:   Post-operative Plan: Extubation in OR  Informed Consent: I have reviewed the patients History and Physical, chart, labs and discussed the procedure including the risks, benefits and alternatives for the proposed anesthesia with the patient or authorized representative who has indicated his/her understanding and acceptance.   Dental advisory given  Plan Discussed with: CRNA  Anesthesia Plan Comments:        Anesthesia Quick Evaluation

## 2017-12-28 NOTE — Discharge Instructions (Signed)
1.  Activity:  You are encouraged to ambulate frequently (about every hour during waking hours) to help prevent blood clots from forming in your legs or lungs.  However, you should not engage in any heavy lifting (> 10-15 lbs), strenuous activity, or straining. 2. Diet: You should advance your diet as instructed by your physician.  It will be normal to have some bloating, nausea, and abdominal discomfort intermittently. 3. Prescriptions:  You will be provided a prescription for pain medication to take as needed.  If your pain is not severe enough to require the prescription pain medication, you may take extra strength Tylenol instead which will have less side effects.  You should also take a prescribed stool softener to avoid straining with bowel movements as the prescription pain medication may constipate you. 4. Incisions: You may remove your dressing bandages 48 hours after surgery if not removed in the hospital.  You will either have some small staples or special tissue glue at each of the incision sites. Once the bandages are removed (if present), the incisions may stay open to air.  You may start showering (but not soaking or bathing in water) the 2nd day after surgery and the incisions simply need to be patted dry after the shower.  No additional care is needed. 5. What to call us about: You should call the office ([phone]) if you develop fever > 101 or develop persistent vomiting.   You may resume advil, aleve, vitamins, and supplements 7 days after surgery. 

## 2017-12-29 ENCOUNTER — Encounter (HOSPITAL_COMMUNITY): Payer: Self-pay | Admitting: Urology

## 2017-12-29 LAB — HEMOGLOBIN AND HEMATOCRIT, BLOOD
HCT: 35.9 % — ABNORMAL LOW (ref 36.0–46.0)
Hemoglobin: 11.4 g/dL — ABNORMAL LOW (ref 12.0–15.0)

## 2017-12-29 LAB — BASIC METABOLIC PANEL
Anion gap: 9 (ref 5–15)
Anion gap: 10 (ref 5–15)
BUN: 17 mg/dL (ref 8–23)
BUN: 18 mg/dL (ref 8–23)
CO2: 25 mmol/L (ref 22–32)
CO2: 23 mmol/L (ref 22–32)
Calcium: 9.3 mg/dL (ref 8.9–10.3)
Calcium: 9.2 mg/dL (ref 8.9–10.3)
Chloride: 97 mmol/L — ABNORMAL LOW (ref 98–111)
Chloride: 97 mmol/L — ABNORMAL LOW (ref 98–111)
Creatinine, Ser: 1.62 mg/dL — ABNORMAL HIGH (ref 0.44–1.00)
Creatinine, Ser: 1.79 mg/dL — ABNORMAL HIGH (ref 0.44–1.00)
GFR calc Af Amer: 36 mL/min — ABNORMAL LOW (ref >60)
GFR calc Af Amer: 32 mL/min — ABNORMAL LOW (ref >60)
GFR calc non Af Amer: 31 mL/min — ABNORMAL LOW (ref >60)
GFR calc non Af Amer: 27 mL/min — ABNORMAL LOW (ref >60)
GLUCOSE: 136 mg/dL — AB (ref 70–99)
Glucose, Bld: 191 mg/dL — ABNORMAL HIGH (ref 70–99)
POTASSIUM: 5.4 mmol/L — AB (ref 3.5–5.1)
Potassium: 4.6 mmol/L (ref 3.5–5.1)
Sodium: 131 mmol/L — ABNORMAL LOW (ref 135–145)
Sodium: 130 mmol/L — ABNORMAL LOW (ref 135–145)

## 2017-12-29 MED ORDER — ACETAMINOPHEN 325 MG PO TABS
650.0000 mg | ORAL_TABLET | Freq: Four times a day (QID) | ORAL | Status: DC | PRN
  Administered 2017-12-29 – 2017-12-31 (×3): 650 mg via ORAL
  Filled 2017-12-29 (×3): qty 2

## 2017-12-29 MED ORDER — BISACODYL 10 MG RE SUPP
10.0000 mg | Freq: Once | RECTAL | Status: AC
  Administered 2017-12-29: 10 mg via RECTAL
  Filled 2017-12-29: qty 1

## 2017-12-29 MED ORDER — TRAMADOL HCL 50 MG PO TABS
50.0000 mg | ORAL_TABLET | Freq: Four times a day (QID) | ORAL | Status: DC | PRN
  Administered 2017-12-29 (×2): 50 mg via ORAL
  Filled 2017-12-29 (×2): qty 1

## 2017-12-29 NOTE — Progress Notes (Signed)
Patient ID: NETRA POSTLETHWAIT, female   DOB: 1940-12-06, 77 y.o.   MRN: [id number]  1 Day Post-Op Subjective: Doing well.  Pain controlled.  Denies nausea and vomiting.  Did not ambulate last night.  Objective: Vital signs in last 24 hours: Temp:  [97.4 F (36.3 C)-98.7 F (37.1 C)] 98.1 F (36.7 C) (12/20 0403) Pulse Rate:  [62-76] 67 (12/20 0403) Resp:  [15-20] 20 (12/20 0403) BP: (113-133)/(52-66) 133/59 (12/20 0403) SpO2:  [90 %-100 %] 94 % (12/20 0403) Arterial Line BP: (133-144)/(54-59) 133/54 (12/19 1600) Weight:  [94.8 kg] 94.8 kg (12/19 1026)  Intake/Output from previous day: 12/19 0701 - 12/20 0700 In: 3872.4 [P.O.:120; I.V.:2802.4; IV Piggyback:950] Out: 740 [Urine:690; Blood:50] Intake/Output this shift: No intake/output data recorded.  Physical Exam:  General: Alert and oriented CV: RRR Lungs: Clear Abdomen: Soft, ND, positive BS Incisions: C/D/I Ext: NT, No erythema  Lab Results: Recent Labs    12/28/17 1120 12/28/17 1541 12/29/17 0538  HGB 11.9* 12.3 11.4*  HCT 37.4 38.8 35.9*   BMET Recent Labs    12/28/17 1541 12/29/17 0538  NA 137 131*  K 4.0 5.4*  CL 105 97*  CO2 22 25  GLUCOSE 161* 191*  BUN 14 17  CREATININE 1.03* 1.62*  CALCIUM 9.3 9.3     Studies/Results: Path pending.  Assessment/Plan: POD # 1 s/p right laparoscopic nephrectomy - Ambulate, IS - D/C Foley - Dulcolax suppository and advance diet - Decrease IVF - Monitor renal function - Oral pain meds - Continue ASA 81 mg and telemetry s/p TAVR and h/o CAD   LOS: 1 day   Liylah Najarro,LES 12/29/2017, 7:28 AM

## 2017-12-29 NOTE — Discharge Summary (Signed)
Date of admission: 12/28/2017  Date of discharge: 12/31/2017  Admission diagnosis: Right renal neoplasm  Discharge diagnosis: Right renal neoplasm  Secondary diagnoses: CAD, aortic stenosis, hypertension  History and Physical: For full details, please see admission history and physical. Briefly, [name] is a 77 y.o. 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  Hospital Course: She underwent a right laparoscopic radical nephrectomy on 12/28/17.  She tolerated this well.  She remained hemodynamically stable and was able to begin ambulating on POD #1.  Her diet was advanced and her pain was controlled.  She was able to be discharged home on POD #3.  Laboratory values:  Recent Labs    12/28/17 1541 12/29/17 0538  HGB 12.3 11.4*  HCT 38.8 35.9*   Recent Labs    12/29/17 1341 12/30/17 0543  CREATININE 1.79* 1.66*    Disposition: Home  Discharge instruction: The patient was instructed to be ambulatory but told to refrain from heavy lifting, strenuous activity, or driving.   Discharge medications:  Allergies as of 12/31/2017      Reactions   Chantix [varenicline Tartrate] Hives   Dipyridamole Hives   Varenicline Hives      Medication List    STOP taking these medications   Vitamin D 50 MCG (2000 UT) tablet     TAKE these medications   acetaminophen 500 MG tablet Commonly known as:  TYLENOL Take 1,000 mg by mouth every 6 (six) hours as needed for moderate pain or headache.   ALPRAZolam 0.25 MG tablet Commonly known as:  XANAX Take 0.25 mg by mouth 3 (three) times daily as needed for anxiety.   amLODipine 10 MG tablet Commonly known as:  NORVASC TAKE 1 TABLET BY MOUTH EVERY DAY   amoxicillin 500 MG tablet Commonly known as:  AMOXIL Take 4 tablets (2,000 mg total) by mouth as directed. Take 1 hour prior to dental visits.   aspirin EC 81 MG tablet Take 81 mg by mouth at bedtime.   BYSTOLIC 20 MG  Tabs Generic drug:  Nebivolol HCl TAKE 1 TABLET BY MOUTH EVERY DAY What changed:    how much to take  how to take this  when to take this  additional instructions   calcium elemental as carbonate 400 MG chewable tablet Commonly known as:  BARIATRIC TUMS ULTRA Chew 1,000 mg by mouth 3 (three) times daily as needed for heartburn.   chlorthalidone 25 MG tablet Commonly known as:  HYGROTON Take 12.5 mg by mouth daily.   FLUoxetine 40 MG capsule Commonly known as:  PROZAC Take 40 mg by mouth every evening.   furosemide 20 MG tablet Commonly known as:  LASIX Take 20 mg by mouth daily as needed for edema.   irbesartan 300 MG tablet Commonly known as:  AVAPRO TAKE 1 TABLET BY MOUTH EVERY DAY   levothyroxine 50 MCG tablet Commonly known as:  SYNTHROID, LEVOTHROID Take 50 mcg by mouth daily before breakfast.   loperamide 2 MG capsule Commonly known as:  IMODIUM Take 2-4 mg by mouth 4 (four) times daily as needed for diarrhea or loose stools.   nitroGLYCERIN 0.4 MG SL tablet Commonly known as:  NITROSTAT Place 0.4 mg under the tongue every 5 (five) minutes as needed for chest pain (MAX 3 TABLETS).   ondansetron 4 MG tablet Commonly known as:  ZOFRAN Take 1 tablet (4 mg total) by mouth every 8 (eight) hours as needed for nausea or vomiting.  pantoprazole 40 MG tablet Commonly known as:  PROTONIX Take 1 tablet (40 mg total) by mouth daily.   PROAIR HFA 108 (90 Base) MCG/ACT inhaler Generic drug:  albuterol Inhale 2 puffs into the lungs every 4 (four) hours as needed for shortness of breath or wheezing.   simethicone 125 MG chewable tablet Commonly known as:  MYLICON Chew 270 mg by mouth every 6 (six) hours as needed for flatulence.   simvastatin 40 MG tablet Commonly known as:  ZOCOR Take 1 tablet (40 mg total) by mouth at bedtime.   SYMBICORT 160-4.5 MCG/ACT inhaler Generic drug:  budesonide-formoterol Inhale 1 puff into the lungs 2 (two) times daily as needed  (for respiratory issues.).   traMADol 50 MG tablet Commonly known as:  ULTRAM Take 1-2 tablets (50-100 mg total) by mouth every 6 (six) hours as needed for moderate pain or severe pain.       Followup:  Follow-up Information    Raynelle Bring, MD On 01/23/2018.   Specialty:  Urology Why:  at 1:30 Contact information: [place] [address] [phone]

## 2017-12-29 NOTE — Progress Notes (Signed)
Patient ID: [name], female   DOB: May 29, 1940, 77 y.o.   MRN: [id number]  Pt has only ambulated once today.  She did have nausea earlier which is now improving.  No vomiting.  Passing flatus.  Abd: ND, positive BS  Cr 1.79, K 4.6 (improved)  Plan: - Ambulate twice tonight - SL IVF - D/C home in the morning if ambulating well and tolerating diet

## 2017-12-30 LAB — BASIC METABOLIC PANEL
Anion gap: 10 (ref 5–15)
BUN: 16 mg/dL (ref 8–23)
CO2: 23 mmol/L (ref 22–32)
Calcium: 9.5 mg/dL (ref 8.9–10.3)
Chloride: 100 mmol/L (ref 98–111)
Creatinine, Ser: 1.66 mg/dL — ABNORMAL HIGH (ref 0.44–1.00)
GFR calc non Af Amer: 30 mL/min — ABNORMAL LOW (ref >60)
GFR, EST AFRICAN AMERICAN: 35 mL/min — AB (ref >60)
Glucose, Bld: 119 mg/dL — ABNORMAL HIGH (ref 70–99)
Potassium: 4.2 mmol/L (ref 3.5–5.1)
Sodium: 133 mmol/L — ABNORMAL LOW (ref 135–145)

## 2017-12-30 MED ORDER — PROMETHAZINE HCL 25 MG/ML IJ SOLN
12.5000 mg | Freq: Once | INTRAMUSCULAR | Status: AC
  Administered 2017-12-30: 12.5 mg via INTRAVENOUS
  Filled 2017-12-30: qty 1

## 2017-12-30 NOTE — Plan of Care (Signed)
  Problem: Education: Goal: Knowledge of General Education information will improve Description Including pain rating scale, medication(s)/side effects and non-pharmacologic comfort measures Outcome: Progressing   Problem: Clinical Measurements: Goal: Ability to maintain clinical measurements within normal limits will improve Outcome: Progressing Goal: Will remain free from infection Outcome: Progressing Goal: Diagnostic test results will improve Outcome: Progressing Goal: Respiratory complications will improve Outcome: Progressing Goal: Cardiovascular complication will be avoided Outcome: Progressing   Problem: Activity: Goal: Risk for activity intolerance will decrease Outcome: Progressing   Problem: Nutrition: Goal: Adequate nutrition will be maintained Outcome: Progressing   Problem: Elimination: Goal: Will not experience complications related to urinary retention Outcome: Progressing   Problem: Pain Managment: Goal: General experience of comfort will improve Outcome: Progressing   Problem: Bowel/Gastric: Goal: Gastrointestinal status for postoperative course will improve Outcome: Progressing   Problem: Skin Integrity: Goal: Demonstration of wound healing without infection will improve Outcome: Progressing   Problem: Urinary Elimination: Goal: Ability to avoid or minimize complications of infection will improve Outcome: Progressing Goal: Ability to achieve and maintain urine output will improve Outcome: Progressing

## 2017-12-30 NOTE — Progress Notes (Signed)
2 Days Post-Op Subjective: The patient had issues with nausea without vomiting overnight.  Her nausea symptoms improved after IV Phenergan.  She denies abdominal pain this and has been able to drink a small amount of water this morning.  No solid food yet.  She is passing flatus, but has not had a bowel movement yet.  She ambulated a couple of times yesterday and is currently sitting up in a chair.  Urine output and lab work appropriate this morning.  Objective: Vital signs in last 24 hours: Temp:  [98.2 F (36.8 C)-98.6 F (37 C)] 98.2 F (36.8 C) (12/21 0431) Pulse Rate:  [64-73] 73 (12/21 0431) Resp:  [16-20] 18 (12/21 0431) BP: (126-134)/(60-74) 134/74 (12/21 0431) SpO2:  [93 %-98 %] 95 % (12/21 0800)  Intake/Output from previous day: 12/20 0701 - 12/21 0700 In: 360 [P.O.:360] Out: 1400 [Urine:1400]  Intake/Output this shift: No intake/output data recorded.  Physical Exam:  General: Alert and oriented CV: RRR, palpable distal pulses Lungs: CTAB, equal chest rise Abdomen: Soft, Mildly tender around her incisions, no rebound or guarding Incisions: Clean, dry and intact Ext: NT, No erythema  Lab Results: Recent Labs    12/28/17 1120 12/28/17 1541 12/29/17 0538  HGB 11.9* 12.3 11.4*  HCT 37.4 38.8 35.9*   BMET Recent Labs    12/29/17 1341 12/30/17 0543  NA 130* 133*  K 4.6 4.2  CL 97* 100  CO2 23 23  GLUCOSE 136* 119*  BUN 18 16  CREATININE 1.79* 1.66*  CALCIUM 9.2 9.5     Studies/Results: No results found.  Assessment/Plan: Postop day 2 status post right lap nephrectomy  -Continue PRN Phenergan for nausea.  Advance diet as tolerated -Out of bed to chair and ambulate -Will continue to monitor until her nausea has subsided and she is tolerating a regular diet  LOS: 2 days   Ellison Hughs, MD [facility] Pager: [phone]  12/30/2017, 9:28 AM

## 2017-12-31 MED ORDER — ONDANSETRON HCL 4 MG PO TABS: 4.0000 mg | ORAL_TABLET | Freq: Three times a day (TID) | ORAL | 0 refills | Status: DC | PRN

## 2017-12-31 NOTE — Progress Notes (Signed)
Urology Inpatient Progress Report  RIGHT RENAL NEOPLASM  Procedure(s): LAPAROSCOPIC RADICAL NEPHRECTOMY  3 Days Post-Op   Intv/Subj: No acute events overnight. Patient is without complaint. No nausea, tolerating small amt of regular food Ambulating BM yesterday Pain reasonably well controlled.  Active Problems:   Neoplasm of right kidney  Current Facility-Administered Medications  Medication Dose Route Frequency Provider Last Rate Last Dose  . acetaminophen (TYLENOL) tablet 650 mg  650 mg Oral Q6H PRN Raynelle Bring, MD   650 mg at 12/31/17 0222  . albuterol (PROVENTIL) (2.5 MG/3ML) 0.083% nebulizer solution 3 mL  3 mL Inhalation Q4H PRN Raynelle Bring, MD      . ALPRAZolam Duanne Moron) tablet 0.25 mg  0.25 mg Oral TID PRN Raynelle Bring, MD   0.25 mg at 12/30/17 2111  . amLODipine (NORVASC) tablet 10 mg  10 mg Oral Daily Raynelle Bring, MD   10 mg at 12/31/17 1027  . aspirin EC tablet 81 mg  81 mg Oral QHS Raynelle Bring, MD   81 mg at 12/30/17 2111  . chlorthalidone (HYGROTON) tablet 12.5 mg  12.5 mg Oral Daily Raynelle Bring, MD   12.5 mg at 12/31/17 1026  . dextrose 5 %-0.45 % sodium chloride infusion   Intravenous Continuous Raynelle Bring, MD   Stopped at 12/29/17 1732  . diphenhydrAMINE (BENADRYL) injection 12.5 mg  12.5 mg Intravenous Q6H PRN Raynelle Bring, MD       Or  . diphenhydrAMINE (BENADRYL) 12.5 MG/5ML elixir 12.5 mg  12.5 mg Oral Q6H PRN Raynelle Bring, MD      . docusate sodium (COLACE) capsule 100 mg  100 mg Oral BID Raynelle Bring, MD   100 mg at 12/31/17 1027  . FLUoxetine (PROZAC) capsule 40 mg  40 mg Oral QPM Raynelle Bring, MD   40 mg at 12/30/17 1757  . furosemide (LASIX) tablet 20 mg  20 mg Oral Daily PRN Raynelle Bring, MD      . irbesartan Levy Sjogren) tablet 300 mg  300 mg Oral Daily Raynelle Bring, MD   300 mg at 12/31/17 1027  . levothyroxine (SYNTHROID, LEVOTHROID) tablet 50 mcg  50 mcg Oral Q0600 Raynelle Bring, MD   50 mcg at 12/31/17 0506  .  mometasone-formoterol (DULERA) 200-5 MCG/ACT inhaler 2 puff  2 puff Inhalation BID Raynelle Bring, MD   2 puff at 12/31/17 0757  . nebivolol (BYSTOLIC) tablet 20 mg  20 mg Oral Daily Raynelle Bring, MD   20 mg at 12/31/17 1026  . nitroGLYCERIN (NITROSTAT) SL tablet 0.4 mg  0.4 mg Sublingual Q5 min PRN Raynelle Bring, MD      . ondansetron [facility]) injection 4 mg  4 mg Intravenous Q4H PRN Raynelle Bring, MD   4 mg at 12/30/17 1445  . pantoprazole (PROTONIX) EC tablet 40 mg  40 mg Oral Daily Raynelle Bring, MD   40 mg at 12/31/17 1027  . simvastatin (ZOCOR) tablet 40 mg  40 mg Oral QHS Raynelle Bring, MD   40 mg at 12/30/17 2111  . traMADol (ULTRAM) tablet 50-100 mg  50-100 mg Oral Q6H PRN Raynelle Bring, MD   50 mg at 12/29/17 1525     Objective: Vital: Vitals:   12/30/17 1302 12/30/17 2011 12/31/17 0506 12/31/17 0758  BP: 130/61 136/72 137/64   Pulse: 67 67 62   Resp:  18 16   Temp: 97.9 F (36.6 C) 98.2 F (36.8 C) 98.3 F (36.8 C)   TempSrc: Oral Oral Oral   SpO2: 99% 98% 98%  96%  Weight:      Height:       I/Os: I/O last 3 completed shifts: In: 100 [P.O.:100] Out: 850 [Urine:850]  Physical Exam:  General: Patient is in no apparent distress Lungs: Normal respiratory effort, chest expands symmetrically. GI: Incisions are c/d/i. The abdomen is soft and nontender without mass. Ext: lower extremities symmetric  Lab Results: Recent Labs    12/28/17 1541 12/29/17 0538  HGB 12.3 11.4*  HCT 38.8 35.9*   Recent Labs    12/29/17 0538 12/29/17 1341 12/30/17 0543  NA 131* 130* 133*  K 5.4* 4.6 4.2  CL 97* 97* 100  CO2 25 23 23   GLUCOSE 191* 136* 119*  BUN 17 18 16   CREATININE 1.62* 1.79* 1.66*  CALCIUM 9.3 9.2 9.5   No results for input(s): LABPT, INR in the last 72 hours. No results for input(s): LABURIN in the last 72 hours. Results for orders placed or performed during the hospital encounter of 05/05/17  Surgical pcr screen     Status: None   Collection Time:  05/05/17  9:09 AM  Result Value Ref Range Status   MRSA, PCR NEGATIVE NEGATIVE Final   Staphylococcus aureus NEGATIVE NEGATIVE Final    Comment: (NOTE) The Xpert SA Assay (FDA approved for NASAL specimens in patients 67 years of age and older), is one component of a comprehensive surveillance program. It is not intended to diagnose infection nor to guide or monitor treatment. Performed at [facility], [place] [address], [place],  [id number]     Studies/Results: No results found.  Assessment: Procedure(s): LAPAROSCOPIC RADICAL NEPHRECTOMY, 3 Days Post-Op  doing well.  Plan: D/c home today.   Louis Meckel, MD Urology 12/31/2017, 11:44 AM

## 2018-01-10 ENCOUNTER — Emergency Department (HOSPITAL_COMMUNITY): Payer: Medicare Other

## 2018-01-10 ENCOUNTER — Encounter (HOSPITAL_COMMUNITY): Payer: Self-pay

## 2018-01-10 ENCOUNTER — Observation Stay (HOSPITAL_COMMUNITY)
Admission: EM | Admit: 2018-01-10 | Discharge: 2018-01-14 | Disposition: A | Discharge status: Home Health | Payer: Medicare Other | Attending: Internal Medicine | Admitting: Internal Medicine

## 2018-01-10 DIAGNOSIS — T8141XA Infection following a procedure, superficial incisional surgical site, initial encounter: Secondary | ICD-10-CM | POA: Diagnosis not present

## 2018-01-10 DIAGNOSIS — Z86010 Personal history of colonic polyps: Secondary | ICD-10-CM | POA: Insufficient documentation

## 2018-01-10 DIAGNOSIS — N183 Chronic kidney disease, stage 3 (moderate): Secondary | ICD-10-CM | POA: Insufficient documentation

## 2018-01-10 DIAGNOSIS — Z9884 Bariatric surgery status: Secondary | ICD-10-CM | POA: Insufficient documentation

## 2018-01-10 DIAGNOSIS — Z9049 Acquired absence of other specified parts of digestive tract: Secondary | ICD-10-CM | POA: Insufficient documentation

## 2018-01-10 DIAGNOSIS — Z85528 Personal history of other malignant neoplasm of kidney: Secondary | ICD-10-CM | POA: Insufficient documentation

## 2018-01-10 DIAGNOSIS — I5032 Chronic diastolic (congestive) heart failure: Secondary | ICD-10-CM | POA: Diagnosis not present

## 2018-01-10 DIAGNOSIS — N179 Acute kidney failure, unspecified: Secondary | ICD-10-CM | POA: Diagnosis present

## 2018-01-10 DIAGNOSIS — J449 Chronic obstructive pulmonary disease, unspecified: Secondary | ICD-10-CM | POA: Diagnosis present

## 2018-01-10 DIAGNOSIS — Z952 Presence of prosthetic heart valve: Secondary | ICD-10-CM | POA: Insufficient documentation

## 2018-01-10 DIAGNOSIS — D72829 Elevated white blood cell count, unspecified: Secondary | ICD-10-CM | POA: Insufficient documentation

## 2018-01-10 DIAGNOSIS — F419 Anxiety disorder, unspecified: Secondary | ICD-10-CM | POA: Insufficient documentation

## 2018-01-10 DIAGNOSIS — A419 Sepsis, unspecified organism: Secondary | ICD-10-CM

## 2018-01-10 DIAGNOSIS — D631 Anemia in chronic kidney disease: Secondary | ICD-10-CM | POA: Insufficient documentation

## 2018-01-10 DIAGNOSIS — K529 Noninfective gastroenteritis and colitis, unspecified: Secondary | ICD-10-CM | POA: Insufficient documentation

## 2018-01-10 DIAGNOSIS — Z800 Family history of malignant neoplasm of digestive organs: Secondary | ICD-10-CM | POA: Insufficient documentation

## 2018-01-10 DIAGNOSIS — I2510 Atherosclerotic heart disease of native coronary artery without angina pectoris: Secondary | ICD-10-CM | POA: Diagnosis not present

## 2018-01-10 DIAGNOSIS — F17210 Nicotine dependence, cigarettes, uncomplicated: Secondary | ICD-10-CM | POA: Insufficient documentation

## 2018-01-10 DIAGNOSIS — T8149XA Infection following a procedure, other surgical site, initial encounter: Secondary | ICD-10-CM

## 2018-01-10 DIAGNOSIS — L03311 Cellulitis of abdominal wall: Secondary | ICD-10-CM

## 2018-01-10 DIAGNOSIS — E039 Hypothyroidism, unspecified: Secondary | ICD-10-CM | POA: Diagnosis not present

## 2018-01-10 DIAGNOSIS — I350 Nonrheumatic aortic (valve) stenosis: Secondary | ICD-10-CM | POA: Diagnosis not present

## 2018-01-10 DIAGNOSIS — L02211 Cutaneous abscess of abdominal wall: Secondary | ICD-10-CM

## 2018-01-10 DIAGNOSIS — Z79899 Other long term (current) drug therapy: Secondary | ICD-10-CM | POA: Insufficient documentation

## 2018-01-10 DIAGNOSIS — I130 Hypertensive heart and chronic kidney disease with heart failure and stage 1 through stage 4 chronic kidney disease, or unspecified chronic kidney disease: Secondary | ICD-10-CM | POA: Diagnosis not present

## 2018-01-10 DIAGNOSIS — C649 Malignant neoplasm of unspecified kidney, except renal pelvis: Secondary | ICD-10-CM | POA: Diagnosis present

## 2018-01-10 DIAGNOSIS — X58XXXA Exposure to other specified factors, initial encounter: Secondary | ICD-10-CM | POA: Diagnosis not present

## 2018-01-10 DIAGNOSIS — F411 Generalized anxiety disorder: Secondary | ICD-10-CM | POA: Diagnosis present

## 2018-01-10 DIAGNOSIS — Z6833 Body mass index (BMI) 33.0-33.9, adult: Secondary | ICD-10-CM | POA: Insufficient documentation

## 2018-01-10 DIAGNOSIS — Z823 Family history of stroke: Secondary | ICD-10-CM | POA: Insufficient documentation

## 2018-01-10 DIAGNOSIS — Z7982 Long term (current) use of aspirin: Secondary | ICD-10-CM | POA: Insufficient documentation

## 2018-01-10 DIAGNOSIS — I10 Essential (primary) hypertension: Secondary | ICD-10-CM | POA: Diagnosis present

## 2018-01-10 DIAGNOSIS — R6520 Severe sepsis without septic shock: Secondary | ICD-10-CM

## 2018-01-10 DIAGNOSIS — K219 Gastro-esophageal reflux disease without esophagitis: Secondary | ICD-10-CM | POA: Diagnosis not present

## 2018-01-10 DIAGNOSIS — Z833 Family history of diabetes mellitus: Secondary | ICD-10-CM | POA: Insufficient documentation

## 2018-01-10 DIAGNOSIS — F329 Major depressive disorder, single episode, unspecified: Secondary | ICD-10-CM | POA: Diagnosis not present

## 2018-01-10 DIAGNOSIS — D539 Nutritional anemia, unspecified: Secondary | ICD-10-CM

## 2018-01-10 DIAGNOSIS — I739 Peripheral vascular disease, unspecified: Secondary | ICD-10-CM | POA: Diagnosis not present

## 2018-01-10 DIAGNOSIS — Z951 Presence of aortocoronary bypass graft: Secondary | ICD-10-CM | POA: Insufficient documentation

## 2018-01-10 DIAGNOSIS — Z888 Allergy status to other drugs, medicaments and biological substances status: Secondary | ICD-10-CM | POA: Insufficient documentation

## 2018-01-10 DIAGNOSIS — K5730 Diverticulosis of large intestine without perforation or abscess without bleeding: Secondary | ICD-10-CM | POA: Insufficient documentation

## 2018-01-10 DIAGNOSIS — K760 Fatty (change of) liver, not elsewhere classified: Secondary | ICD-10-CM | POA: Insufficient documentation

## 2018-01-10 DIAGNOSIS — I714 Abdominal aortic aneurysm, without rupture: Secondary | ICD-10-CM | POA: Insufficient documentation

## 2018-01-10 DIAGNOSIS — Z905 Acquired absence of kidney: Secondary | ICD-10-CM | POA: Insufficient documentation

## 2018-01-10 DIAGNOSIS — E785 Hyperlipidemia, unspecified: Secondary | ICD-10-CM | POA: Insufficient documentation

## 2018-01-10 LAB — CBC WITH DIFFERENTIAL/PLATELET
Abs Immature Granulocytes: 0.21 10*3/uL — ABNORMAL HIGH (ref 0.00–0.07)
BASOS PCT: 0 %
Basophils Absolute: 0.1 10*3/uL (ref 0.0–0.1)
EOS ABS: 0 10*3/uL (ref 0.0–0.5)
Eosinophils Relative: 0 %
HCT: 35.1 % — ABNORMAL LOW (ref 36.0–46.0)
Hemoglobin: 11.1 g/dL — ABNORMAL LOW (ref 12.0–15.0)
Immature Granulocytes: 1 %
Lymphocytes Relative: 5 %
Lymphs Abs: 0.8 10*3/uL (ref 0.7–4.0)
MCH: 31.9 pg (ref 26.0–34.0)
MCHC: 31.6 g/dL (ref 30.0–36.0)
MCV: 100.9 fL — ABNORMAL HIGH (ref 80.0–100.0)
Monocytes Absolute: 1.3 10*3/uL — ABNORMAL HIGH (ref 0.1–1.0)
Monocytes Relative: 7 %
Neutro Abs: 15.7 10*3/uL — ABNORMAL HIGH (ref 1.7–7.7)
Neutrophils Relative %: 87 %
Platelets: 200 10*3/uL (ref 150–400)
RBC: 3.48 MIL/uL — ABNORMAL LOW (ref 3.87–5.11)
RDW: 12.8 % (ref 11.5–15.5)
WBC: 18.1 10*3/uL — AB (ref 4.0–10.5)
nRBC: 0 % (ref 0.0–0.2)

## 2018-01-10 LAB — COMPREHENSIVE METABOLIC PANEL
ALT: 8 U/L (ref 0–44)
AST: 14 U/L — ABNORMAL LOW (ref 15–41)
Albumin: 3 g/dL — ABNORMAL LOW (ref 3.5–5.0)
Alkaline Phosphatase: 64 U/L (ref 38–126)
Anion gap: 9 (ref 5–15)
BUN: 29 mg/dL — ABNORMAL HIGH (ref 8–23)
CO2: 22 mmol/L (ref 22–32)
Calcium: 9.4 mg/dL (ref 8.9–10.3)
Chloride: 102 mmol/L (ref 98–111)
Creatinine, Ser: 2.01 mg/dL — ABNORMAL HIGH (ref 0.44–1.00)
GFR calc Af Amer: 27 mL/min — ABNORMAL LOW (ref >60)
GFR calc non Af Amer: 24 mL/min — ABNORMAL LOW (ref >60)
Glucose, Bld: 139 mg/dL — ABNORMAL HIGH (ref 70–99)
Potassium: 4.3 mmol/L (ref 3.5–5.1)
Sodium: 133 mmol/L — ABNORMAL LOW (ref 135–145)
Total Bilirubin: 1.2 mg/dL (ref 0.3–1.2)
Total Protein: 6.9 g/dL (ref 6.5–8.1)

## 2018-01-10 LAB — URINALYSIS, ROUTINE W REFLEX MICROSCOPIC
BACTERIA UA: NONE SEEN
Bilirubin Urine: NEGATIVE
Glucose, UA: NEGATIVE mg/dL
KETONES UR: NEGATIVE mg/dL
Leukocytes, UA: NEGATIVE
Nitrite: NEGATIVE
Protein, ur: NEGATIVE mg/dL
Specific Gravity, Urine: 1.016 (ref 1.005–1.030)
pH: 5 (ref 5.0–8.0)

## 2018-01-10 LAB — PROTIME-INR
INR: 1.01
Prothrombin Time: 13.2 seconds (ref 11.4–15.2)

## 2018-01-10 LAB — I-STAT CG4 LACTIC ACID, ED: LACTIC ACID, VENOUS: 1.17 mmol/L (ref 0.5–1.9)

## 2018-01-10 MED ORDER — SODIUM CHLORIDE 0.9 % IV BOLUS
1000.0000 mL | Freq: Once | INTRAVENOUS | Status: AC
  Administered 2018-01-10: 1000 mL via INTRAVENOUS

## 2018-01-10 MED ORDER — IOHEXOL 300 MG/ML  SOLN
30.0000 mL | Freq: Once | INTRAMUSCULAR | Status: AC | PRN
  Administered 2018-01-10: 30 mL via ORAL

## 2018-01-10 MED ORDER — ONDANSETRON HCL 4 MG/2ML IJ SOLN
4.0000 mg | Freq: Once | INTRAMUSCULAR | Status: AC
  Administered 2018-01-10: 4 mg via INTRAVENOUS
  Filled 2018-01-10: qty 2

## 2018-01-10 MED ORDER — ALBUTEROL SULFATE (2.5 MG/3ML) 0.083% IN NEBU
5.0000 mg | INHALATION_SOLUTION | Freq: Once | RESPIRATORY_TRACT | Status: AC
  Administered 2018-01-10: 5 mg via RESPIRATORY_TRACT
  Filled 2018-01-10: qty 6

## 2018-01-10 MED ORDER — VANCOMYCIN HCL 10 G IV SOLR
1750.0000 mg | Freq: Once | INTRAVENOUS | Status: AC
  Administered 2018-01-10: 1750 mg via INTRAVENOUS
  Filled 2018-01-10: qty 1750

## 2018-01-10 MED ORDER — VANCOMYCIN HCL IN DEXTROSE 1-5 GM/200ML-% IV SOLN: 1000.0000 mg | Freq: Once | INTRAVENOUS | Status: DC

## 2018-01-10 NOTE — ED Notes (Signed)
Attempt x 1 to obtain 2nd set of BC's unsuccessful

## 2018-01-10 NOTE — ED Notes (Signed)
Unable to get second set of blood cultures. Second set was attempted to be obtained by this Probation officer and Nilsa Nutting, RN.

## 2018-01-10 NOTE — Progress Notes (Signed)
A consult was received from an ED physician for vancomycin per pharmacy dosing.  The patient's profile has been reviewed for ht/wt/allergies/indication/available labs.   A one time order has been placed for Vancomycin 1750mg  iv x1.  Further antibiotics/pharmacy consults should be ordered by admitting physician if indicated.                       Thank you, [name] 01/10/2018  11:04 PM

## 2018-01-10 NOTE — ED Notes (Signed)
Bed: OO17 Expected date:  Expected time:  Means of arrival:  Comments: EMS 78 yo female N/V abdominal pain-hx nephrectomy
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
## 2018-01-10 NOTE — ED Provider Notes (Signed)
[facility] DEPT Provider Note   CSN: [id number] Arrival date & time: 01/10/18  2117     History   Chief Complaint Chief Complaint  Patient presents with  . Nausea  . Emesis  . Fall    HPI [name] is a 78 y.o. female.  Patient with history of HTN, HLD, valvular heart disease, CAD, PAD, GERD, AAA (infrarenal), renal neoplasm s/p right laparoscopic nephrectomy on 12/28/17 presents with 2 days of chills, today with nausea, unable to tolerate any PO intake despite use of Zofran, progressive generalized weakness. No complaint of pain. She has active wheezing which she treats with Albuterol at home and reports is normal for her. She does not feel SOB and denies cough. No diarrhea. Family is concerned regarding one of her abdominal incisions appearing red, with report by patient of malodorous drainage. No significant pain.  The history is provided by the patient, the spouse and a relative. No language interpreter was used.  Emesis   Associated symptoms include chills and a fever. Pertinent negatives include no cough and no myalgias.  Fall  Pertinent negatives include no chest pain and no shortness of breath.    Past Medical History:  Diagnosis Date  . Abdominal aortic aneurysm ([place])    infrarenal diagnosed with maximum measurement at 3.3 cm  . Coronary artery disease   . Depression   . Diverticulosis   . GERD (gastroesophageal reflux disease)   . History of tobacco abuse    Recurrent cigarette smoking  . Hyperlipidemia   . Hyperplastic colon polyp   . Hypertension   . Hypothyroidism   . Ischemic heart disease    had redo bypass surgery in 2001, at that time she had a free right internal mammary to the LAD, LAD endarterectomy, sequential vein graft to the OM1 and OM2 -- last catheterization was in 2008  . Leg fracture    remote right leg fracture  . Microscopic colitis   . Obesity    with gastric bypass surgery in 2004  . PAD (peripheral  artery disease) (H[facility])    right iliac occlusion  . PONV (postoperative nausea and vomiting)   . Right kidney mass   . S/P CABG x 4 1985   LIMA to LAD, Sequential SVG to OM1-OM2, SVG to RCA - Dr Redmond Pulling  . S/P redo CABG x 3 2001   Free RIMA to LAD with endarterectomy, sequential SVG to OM1-OM2 - Dr Servando Snare  . 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 Neoplasm of right kidney 12/28/2017  . Severe aortic stenosis 05/09/2017  . 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 GERD (gastroesophageal reflux disease) 10/19/2016  . 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 Carotid arterial disease ([place]) 05/28/2015  . Bilateral carotid bruits 01/16/2015  . Reflux esophagitis   . Hiatal hernia   . Status post bariatric surgery   . Chronic diarrhea   . Diverticulosis of colon without hemorrhage   . PAD (peripheral artery disease) ([place]) 04/13/2011  . Aortic stenosis, severe 09/10/2010  . CAD (coronary artery disease) 08/11/2010  . HTN (hypertension) 08/11/2010  . Obesities, morbid ([place]) 08/11/2010  . Tobacco abuse 08/11/2010  . Abdominal aortic aneurysm ([place]) 12/28/2007  . DIVERTICULOSIS, COLON 12/28/2007  . FATTY LIVER DISEASE 12/28/2007  . ANEMIA, HX OF 12/28/2007  .  NAUSEA 12/12/2007  . ABDOMINAL PAIN, EPIGASTRIC 12/12/2007  . S/P redo CABG x 3 03/09/1999  . S/P CABG x 4 04/19/1983    Past Surgical History:  Procedure Laterality Date  . ARTERIAL LINE INSERTION Right 05/09/2017   Procedure: ARTERIAL LINE  INSERTION  RIGHT RADIAL ARTERY;  Surgeon: Sherren Mocha, MD;  Location: [facility];  Service: Open Heart Surgery;  Laterality: Right;  . CARDIAC CATHETERIZATION  02/16/1999   Left heart catheterization with selective coronary  angiography, left ventricular angiography (RAO and LAO views), angiography of  left internal mammary artery, and angiography of saphenous vein grafts  . CARDIAC CATHETERIZATION  10/20/2006   Est. EF of 55% -- Totally occuladed native coronary circulation -- Persistent patency of the right mammary graft and the distal LAD artery with peristent patency of the left mammary graft to the proximal LAD artery with persistant patency of the saphenous vein graft to the sequentail branches of the OM        . CARPAL TUNNEL RELEASE    . CHOLECYSTECTOMY    . COLONOSCOPY  07/12/2007   Dr.Brodie- normal cecum and rectum, diverticulosis in the sigmoid colon  . COLONOSCOPY  1991   per Dr.Brodie office visit note= hyperplastic polyp  . COLONOSCOPY N/A 09/30/2014   Dr.Rourk- noraml appearing rectal mucosa, scattered L sided diverticula, colonic mucosa has a somewhat pale friable appearence diffusely, no ulcers or erosions seen, the distal 10cm of terminal ileum appeared entirely normal. bx=benign colorectal mucosa with lymphocytic colitis  . CORONARY ARTERY BYPASS GRAFT  1985  . CORONARY ARTERY BYPASS GRAFT  03/09/1999   Redo CABG x3 --  with the free right internal mammary artey to the LAD with an LAD endarterectomy -- Sequential saphenous vein graft to OM1 and OM2                   . CYSTOSCOPY/RETROGRADE/URETEROSCOPY Right 12/21/2017   Procedure: ZOXWRUEAVW/[id number];  Surgeon: Raynelle Bring, MD;  Location: [facility];  Service: Urology;  Laterality: Right;  . ESOPHAGOGASTRODUODENOSCOPY N/A 09/30/2014   Dr.Rourk-mild erosive reflux esophagitis s/p bariatric surgery  . EYE SURGERY     catracts  . GASTRIC BYPASS  2004  . LAPAROSCOPIC NEPHRECTOMY Right 12/28/2017   Procedure: LAPAROSCOPIC RADICAL NEPHRECTOMY;  Surgeon: Raynelle Bring, MD;  Location: [facility];  Service: Urology;  Laterality: Right;  . RIGHT/LEFT HEART CATH AND CORONARY/GRAFT ANGIOGRAPHY N/A 04/04/2017   Procedure: RIGHT/LEFT HEART CATH AND CORONARY/GRAFT ANGIOGRAPHY;  Surgeon: Martinique, Peter M, MD;  Location: [facility];   Service: Cardiovascular;  Laterality: N/A;  . TEE WITHOUT CARDIOVERSION N/A 05/09/2017   Procedure: TRANSESOPHAGEAL ECHOCARDIOGRAM (TEE);  Surgeon: Sherren Mocha, MD;  Location: [facility];  Service: Open Heart Surgery;  Laterality: N/A;  . TRANSCATHETER AORTIC VALVE REPLACEMENT, TRANSFEMORAL N/A 05/09/2017   Procedure: TRANSCATHETER AORTIC VALVE REPLACEMENT, TRANSFEMORAL using a 77mm Edwards Sapien 3 Aortic Valve;  Surgeon: Sherren Mocha, MD;  Location: [facility];  Service: Open Heart Surgery;  Laterality: N/A;     OB History   No obstetric history on file.      Home Medications    Prior to Admission medications   Medication Sig Start Date End Date Taking? Authorizing Provider  acetaminophen (TYLENOL) 500 MG tablet Take 1,000 mg by mouth every 6 (six) hours as needed for moderate pain or headache.     [provider]  ALPRAZolam Duanne Moron) 0.25 MG tablet Take 0.25 mg by mouth 3 (three) times daily as needed for anxiety. 03/20/17   [provider]  amLODipine (NORVASC) 10 MG tablet TAKE 1 TABLET BY MOUTH EVERY DAY 05/17/17   Martinique, Peter M, MD  amoxicillin (AMOXIL) 500 MG tablet Take 4 tablets (2,000 mg total) by mouth as directed. Take 1 hour prior to dental visits. 06/01/17   Eileen Stanford, PA-C  aspirin EC 81 MG tablet Take 81 mg by mouth at bedtime.     [provider]  BYSTOLIC 20 MG TABS TAKE 1 TABLET BY MOUTH EVERY DAY Patient taking differently: At bedtime 08/14/17   Martinique, Peter M, MD  calcium elemental as carbonate (BARIATRIC TUMS ULTRA) 400 MG chewable tablet Chew 1,000 mg by mouth 3 (three) times daily as needed for heartburn.    [provider]  chlorthalidone (HYGROTON) 25 MG tablet Take 12.5 mg by mouth daily.    [provider]  FLUoxetine (PROZAC) 40 MG capsule Take 40 mg by mouth every evening.    [provider]  furosemide (LASIX) 20 MG tablet Take 20 mg by mouth daily as needed for edema.    [provider]   irbesartan (AVAPRO) 300 MG tablet TAKE 1 TABLET BY MOUTH EVERY DAY 08/14/17   Martinique, Peter M, MD  levothyroxine (SYNTHROID, LEVOTHROID) 50 MCG tablet Take 50 mcg by mouth daily before breakfast.    [provider]  loperamide (IMODIUM) 2 MG capsule Take 2-4 mg by mouth 4 (four) times daily as needed for diarrhea or loose stools.     [provider]  nitroGLYCERIN (NITROSTAT) 0.4 MG SL tablet Place 0.4 mg under the tongue every 5 (five) minutes as needed for chest pain (MAX 3 TABLETS).    [provider]  ondansetron (ZOFRAN) 4 MG tablet Take 1 tablet (4 mg total) by mouth every 8 (eight) hours as needed for nausea or vomiting. 12/31/17   Ardis Hughs, MD  pantoprazole (PROTONIX) 40 MG tablet Take 1 tablet (40 mg total) by mouth daily. 07/15/16   Mahala Menghini, PA-C  PROAIR HFA 108 [phone] Base) MCG/ACT inhaler Inhale 2 puffs into the lungs every 4 (four) hours as needed for shortness of breath or wheezing. 03/14/17   [provider]  simethicone (MYLICON) 355 MG chewable tablet Chew 125 mg by mouth every 6 (six) hours as needed for flatulence.    [provider]  simvastatin (ZOCOR) 40 MG tablet Take 1 tablet (40 mg total) by mouth at bedtime. 11/15/17   Martinique, Peter M, MD  SYMBICORT 160-4.5 MCG/ACT inhaler Inhale 1 puff into the lungs 2 (two) times daily as needed (for respiratory issues.).  03/14/17   [provider]  traMADol (ULTRAM) 50 MG tablet Take 1-2 tablets (50-100 mg total) by mouth every 6 (six) hours as needed for moderate pain or severe pain. 12/28/17   Debbrah Alar, PA-C    Family History Family History  Problem Relation Age of Onset  . Stroke Father   . Diabetes Mother   . Pancreatic cancer Mother     Social History Social History   Tobacco Use  . Smoking status: Current Some Day Smoker    Packs/day: 0.50    Types: Cigarettes    Start date: 01/10/1985    Last attempt to quit: 02/11/2016    Years since quitting: 1.9  .  Smokeless tobacco: Never Used  . Tobacco comment: smokes 1-2 cigarettes a day  Substance Use Topics  . Alcohol use: Yes    Alcohol/week: 0.0 standard drinks    Comment: rarely wine  .  Drug use: No     Allergies   Chantix [varenicline tartrate]; Dipyridamole; and Varenicline   Review of Systems Review of Systems  Constitutional: Positive for chills and fever.  HENT: Negative.   Respiratory: Positive for wheezing. Negative for cough and shortness of breath.   Cardiovascular: Negative for chest pain.  Gastrointestinal: Positive for nausea and vomiting.       See HPI.  Genitourinary: Negative for dysuria.  Musculoskeletal: Negative for myalgias.  Skin: Positive for color change (At umbilical incision).  Neurological: Positive for weakness.     Physical Exam Updated Vital Signs BP (!) 101/51   Pulse 78   Temp (!) 100.9 F (38.3 C) (Rectal)   Resp (!) 40   Ht 5\' 7"  (1.702 m)   Wt 94.8 kg   SpO2 98%   BMI 32.73 kg/m   Physical Exam Vitals signs and nursing note reviewed.  Constitutional:      General: She is not in acute distress.    Appearance: She is well-developed.  HENT:     Head: Normocephalic.     Mouth/Throat:     Mouth: Mucous membranes are dry.  Eyes:     Conjunctiva/sclera: Conjunctivae normal.  Neck:     Musculoskeletal: Normal range of motion and neck supple.  Cardiovascular:     Rate and Rhythm: Normal rate and regular rhythm.     Heart sounds: No murmur.  Pulmonary:     Effort: Pulmonary effort is normal.     Breath sounds: Wheezing present.     Comments: Prolonged expirations Chest:     Chest wall: No tenderness.  Abdominal:     General: Bowel sounds are normal.     Palpations: Abdomen is soft.     Tenderness: There is no abdominal tenderness. There is no guarding or rebound.     Comments: Large erythematous periumbilical hematoma vs abscess, with residual bruising. No active drainage at incision. No visualized wound dehiscence. There is  erythema that extends below the umbilicus without induration.   Musculoskeletal: Normal range of motion.  Skin:    General: Skin is warm and dry.     Findings: Erythema present. No rash.  Neurological:     Mental Status: She is alert and oriented to person, place, and time.      ED Treatments / Results  Labs (all labs ordered are listed, but only abnormal results are displayed) Labs Reviewed  CBC WITH DIFFERENTIAL/PLATELET - Abnormal; Notable for the following components:      Result Value   WBC 18.1 (*)    RBC 3.48 (*)    Hemoglobin 11.1 (*)    HCT 35.1 (*)    MCV 100.9 (*)    Neutro Abs 15.7 (*)    Monocytes Absolute 1.3 (*)    Abs Immature Granulocytes 0.21 (*)    All other components within normal limits  URINALYSIS, ROUTINE W REFLEX MICROSCOPIC - Abnormal; Notable for the following components:   Hgb urine dipstick MODERATE (*)    All other components within normal limits  CULTURE, BLOOD (ROUTINE X 2)  CULTURE, BLOOD (ROUTINE X 2)  URINE CULTURE  PROTIME-INR  COMPREHENSIVE METABOLIC PANEL  I-STAT CG4 LACTIC ACID, ED   Results for orders placed or performed during the hospital encounter of 01/10/18  Comprehensive metabolic panel  Result Value Ref Range   Sodium 133 (L) 135 - 145 mmol/L   Potassium 4.3 3.5 - 5.1 mmol/L   Chloride 102 98 - 111 mmol/L   CO2  22 22 - 32 mmol/L   Glucose, Bld 139 (H) 70 - 99 mg/dL   BUN 29 (H) 8 - 23 mg/dL   Creatinine, Ser 2.01 (H) 0.44 - 1.00 mg/dL   Calcium 9.4 8.9 - 10.3 mg/dL   Total Protein 6.9 6.5 - 8.1 g/dL   Albumin 3.0 (L) 3.5 - 5.0 g/dL   AST 14 (L) 15 - 41 U/L   ALT 8 0 - 44 U/L   Alkaline Phosphatase 64 38 - 126 U/L   Total Bilirubin 1.2 0.3 - 1.2 mg/dL   GFR calc non Af Amer 24 (L) >60 mL/min   GFR calc Af Amer 27 (L) >60 mL/min   Anion gap 9 5 - 15  CBC with Differential  Result Value Ref Range   WBC 18.1 (H) 4.0 - 10.5 K/uL   RBC 3.48 (L) 3.87 - 5.11 MIL/uL   Hemoglobin 11.1 (L) 12.0 - 15.0 g/dL   HCT 35.1 (L)  36.0 - 46.0 %   MCV 100.9 (H) 80.0 - 100.0 fL   MCH 31.9 26.0 - 34.0 pg   MCHC 31.6 30.0 - 36.0 g/dL   RDW 12.8 11.5 - 15.5 %   Platelets 200 150 - 400 K/uL   nRBC 0.0 0.0 - 0.2 %   Neutrophils Relative % 87 %   Neutro Abs 15.7 (H) 1.7 - 7.7 K/uL   Lymphocytes Relative 5 %   Lymphs Abs 0.8 0.7 - 4.0 K/uL   Monocytes Relative 7 %   Monocytes Absolute 1.3 (H) 0.1 - 1.0 K/uL   Eosinophils Relative 0 %   Eosinophils Absolute 0.0 0.0 - 0.5 K/uL   Basophils Relative 0 %   Basophils Absolute 0.1 0.0 - 0.1 K/uL   Immature Granulocytes 1 %   Abs Immature Granulocytes 0.21 (H) 0.00 - 0.07 K/uL  Protime-INR  Result Value Ref Range   Prothrombin Time 13.2 11.4 - 15.2 seconds   INR 1.01   Urinalysis, Routine w reflex microscopic  Result Value Ref Range   Color, Urine YELLOW YELLOW   APPearance CLEAR CLEAR   Specific Gravity, Urine 1.016 1.005 - 1.030   pH 5.0 5.0 - 8.0   Glucose, UA NEGATIVE NEGATIVE mg/dL   Hgb urine dipstick MODERATE (A) NEGATIVE   Bilirubin Urine NEGATIVE NEGATIVE   Ketones, ur NEGATIVE NEGATIVE mg/dL   Protein, ur NEGATIVE NEGATIVE mg/dL   Nitrite NEGATIVE NEGATIVE   Leukocytes, UA NEGATIVE NEGATIVE   RBC / HPF 6-10 0 - 5 RBC/hpf   WBC, UA 0-5 0 - 5 WBC/hpf   Bacteria, UA NONE SEEN NONE SEEN   Squamous Epithelial / LPF 0-5 0 - 5   Mucus PRESENT    Hyaline Casts, UA PRESENT    Amorphous Crystal PRESENT   I-Stat CG4 Lactic Acid, ED  Result Value Ref Range   Lactic Acid, Venous 1.17 0.5 - 1.9 mmol/L  I-Stat CG4 Lactic Acid, ED  Result Value Ref Range   Lactic Acid, Venous 0.78 0.5 - 1.9 mmol/L    EKG None  Radiology No results found.  Procedures Procedures (including critical care time)  Medications Ordered in ED Medications  sodium chloride 0.9 % bolus 1,000 mL (has no administration in time range)  albuterol (PROVENTIL) (2.5 MG/3ML) 0.083% nebulizer solution 5 mg (has no administration in time range)  ondansetron (ZOFRAN) injection 4 mg (has no  administration in time range)     Initial Impression / Assessment and Plan / ED Course  I have reviewed the  triage vital signs and the nursing notes.  Pertinent labs & imaging results that were available during my care of the patient were reviewed by me and considered in my medical decision making (see chart for details).     Discussed with Dr. Alinda Money (urology) who recommends CT scan, preferably with CM if Cr improved from previous, which is pending now. He will review the CT scan and see the patient in the ED.  Renal function won't allow for IV CM. Per Dr. Alinda Money, PO CM will be given.   Re-evaluation, the patient is comfortable. No complaint of nausea or pain. CT pending.   2:00 CT shows large 11 cm abscess at periumbilical incision with gas described in body of report. Presumed gas is from incision however, additional antibiotic coverage ordered (Zosyn and Clinda). Dr. Alinda Money paged, and findings discussed with him. He will be in to see the patient and manage the abscess. Requests admission to the hospitalist service to manage antibiotic therapy and multiple medical problems.   3:30 - hospitalist paged. Discussed with Dr. Myna Hidalgo who accepts the patient for admission.   Final Clinical Impressions(s) / ED Diagnoses   Final diagnoses:  None   1. Cellulitis, abdominal wall 2. Incisional abscess, abdominal wall  ED Discharge Orders    None       Charlann Lange, PA-C 01/11/18 [place]    Pattricia Boss, MD 01/11/18 [phone]

## 2018-01-11 ENCOUNTER — Other Ambulatory Visit: Payer: Self-pay

## 2018-01-11 ENCOUNTER — Telehealth: Payer: Self-pay | Admitting: Cardiology

## 2018-01-11 ENCOUNTER — Observation Stay (HOSPITAL_COMMUNITY): Payer: Medicare Other | Admitting: Anesthesiology

## 2018-01-11 ENCOUNTER — Encounter (HOSPITAL_COMMUNITY): Payer: Self-pay | Admitting: Family Medicine

## 2018-01-11 ENCOUNTER — Emergency Department (HOSPITAL_COMMUNITY): Payer: Medicare Other

## 2018-01-11 ENCOUNTER — Encounter (HOSPITAL_COMMUNITY)
Admission: EM | Disposition: A | Discharge status: Home Health | Payer: Self-pay | Source: Home / Self Care | Attending: Emergency Medicine

## 2018-01-11 DIAGNOSIS — N179 Acute kidney failure, unspecified: Secondary | ICD-10-CM | POA: Diagnosis present

## 2018-01-11 DIAGNOSIS — J449 Chronic obstructive pulmonary disease, unspecified: Secondary | ICD-10-CM | POA: Diagnosis present

## 2018-01-11 DIAGNOSIS — F419 Anxiety disorder, unspecified: Secondary | ICD-10-CM | POA: Diagnosis present

## 2018-01-11 DIAGNOSIS — L02211 Cutaneous abscess of abdominal wall: Secondary | ICD-10-CM | POA: Diagnosis not present

## 2018-01-11 DIAGNOSIS — T8141XA Infection following a procedure, superficial incisional surgical site, initial encounter: Secondary | ICD-10-CM | POA: Diagnosis not present

## 2018-01-11 DIAGNOSIS — I10 Essential (primary) hypertension: Secondary | ICD-10-CM

## 2018-01-11 DIAGNOSIS — I2510 Atherosclerotic heart disease of native coronary artery without angina pectoris: Secondary | ICD-10-CM | POA: Diagnosis not present

## 2018-01-11 DIAGNOSIS — N183 Chronic kidney disease, stage 3 (moderate): Secondary | ICD-10-CM | POA: Diagnosis not present

## 2018-01-11 DIAGNOSIS — C649 Malignant neoplasm of unspecified kidney, except renal pelvis: Secondary | ICD-10-CM | POA: Diagnosis present

## 2018-01-11 DIAGNOSIS — E039 Hypothyroidism, unspecified: Secondary | ICD-10-CM

## 2018-01-11 DIAGNOSIS — I5032 Chronic diastolic (congestive) heart failure: Secondary | ICD-10-CM

## 2018-01-11 DIAGNOSIS — T8149XA Infection following a procedure, other surgical site, initial encounter: Secondary | ICD-10-CM | POA: Diagnosis present

## 2018-01-11 DIAGNOSIS — C641 Malignant neoplasm of right kidney, except renal pelvis: Secondary | ICD-10-CM

## 2018-01-11 DIAGNOSIS — I130 Hypertensive heart and chronic kidney disease with heart failure and stage 1 through stage 4 chronic kidney disease, or unspecified chronic kidney disease: Secondary | ICD-10-CM | POA: Diagnosis not present

## 2018-01-11 DIAGNOSIS — F411 Generalized anxiety disorder: Secondary | ICD-10-CM | POA: Diagnosis present

## 2018-01-11 HISTORY — PX: INCISION AND DRAINAGE ABSCESS: SHX5864

## 2018-01-11 LAB — CBC WITH DIFFERENTIAL/PLATELET
Abs Immature Granulocytes: 0.12 10*3/uL — ABNORMAL HIGH (ref 0.00–0.07)
Basophils Absolute: 0 10*3/uL (ref 0.0–0.1)
Basophils Relative: 0 %
Eosinophils Absolute: 0 10*3/uL (ref 0.0–0.5)
Eosinophils Relative: 0 %
HCT: 30.8 % — ABNORMAL LOW (ref 36.0–46.0)
Hemoglobin: 9.7 g/dL — ABNORMAL LOW (ref 12.0–15.0)
Immature Granulocytes: 1 %
Lymphocytes Relative: 8 %
Lymphs Abs: 1.2 10*3/uL (ref 0.7–4.0)
MCH: 31.7 pg (ref 26.0–34.0)
MCHC: 31.5 g/dL (ref 30.0–36.0)
MCV: 100.7 fL — ABNORMAL HIGH (ref 80.0–100.0)
MONO ABS: 1.1 10*3/uL — AB (ref 0.1–1.0)
MONOS PCT: 7 %
Neutro Abs: 12.4 10*3/uL — ABNORMAL HIGH (ref 1.7–7.7)
Neutrophils Relative %: 84 %
Platelets: 174 10*3/uL (ref 150–400)
RBC: 3.06 MIL/uL — ABNORMAL LOW (ref 3.87–5.11)
RDW: 12.8 % (ref 11.5–15.5)
WBC: 14.9 10*3/uL — ABNORMAL HIGH (ref 4.0–10.5)
nRBC: 0 % (ref 0.0–0.2)

## 2018-01-11 LAB — BASIC METABOLIC PANEL
Anion gap: 8 (ref 5–15)
BUN: 28 mg/dL — ABNORMAL HIGH (ref 8–23)
CO2: 23 mmol/L (ref 22–32)
CREATININE: 1.74 mg/dL — AB (ref 0.44–1.00)
Calcium: 8.9 mg/dL (ref 8.9–10.3)
Chloride: 101 mmol/L (ref 98–111)
GFR calc Af Amer: 33 mL/min — ABNORMAL LOW (ref >60)
GFR calc non Af Amer: 28 mL/min — ABNORMAL LOW (ref >60)
Glucose, Bld: 120 mg/dL — ABNORMAL HIGH (ref 70–99)
Potassium: 4.4 mmol/L (ref 3.5–5.1)
Sodium: 132 mmol/L — ABNORMAL LOW (ref 135–145)

## 2018-01-11 LAB — SURGICAL PCR SCREEN
MRSA, PCR: POSITIVE — AB
Staphylococcus aureus: POSITIVE — AB

## 2018-01-11 LAB — CREATININE, URINE, RANDOM: Creatinine, Urine: 96.73 mg/dL

## 2018-01-11 LAB — I-STAT CG4 LACTIC ACID, ED: Lactic Acid, Venous: 0.78 mmol/L (ref 0.5–1.9)

## 2018-01-11 LAB — SODIUM, URINE, RANDOM: Sodium, Ur: 18 mmol/L

## 2018-01-11 SURGERY — INCISION AND DRAINAGE, ABSCESS
Anesthesia: General | Site: Abdomen

## 2018-01-11 MED ORDER — VANCOMYCIN HCL IN DEXTROSE 1-5 GM/200ML-% IV SOLN
1000.0000 mg | INTRAVENOUS | Status: DC
  Administered 2018-01-12 – 2018-01-14 (×2): 1000 mg via INTRAVENOUS
  Filled 2018-01-11 (×2): qty 200

## 2018-01-11 MED ORDER — OXYCODONE HCL 5 MG/5ML PO SOLN
5.0000 mg | Freq: Once | ORAL | Status: DC | PRN
  Filled 2018-01-11: qty 5

## 2018-01-11 MED ORDER — ONDANSETRON HCL 4 MG/2ML IJ SOLN
INTRAMUSCULAR | Status: DC | PRN
  Administered 2018-01-11: 4 mg via INTRAVENOUS

## 2018-01-11 MED ORDER — PANTOPRAZOLE SODIUM 40 MG PO TBEC
40.0000 mg | DELAYED_RELEASE_TABLET | Freq: Every day | ORAL | Status: DC
  Administered 2018-01-11 – 2018-01-14 (×4): 40 mg via ORAL
  Filled 2018-01-11 (×4): qty 1

## 2018-01-11 MED ORDER — VANCOMYCIN HCL IN DEXTROSE 1-5 GM/200ML-% IV SOLN: 1000.0000 mg | INTRAVENOUS | Status: DC

## 2018-01-11 MED ORDER — OXYCODONE HCL 5 MG PO TABS: 5.0000 mg | ORAL_TABLET | Freq: Once | ORAL | Status: DC | PRN

## 2018-01-11 MED ORDER — ONDANSETRON HCL 4 MG/2ML IJ SOLN
INTRAMUSCULAR | Status: AC
  Filled 2018-01-11: qty 2

## 2018-01-11 MED ORDER — ONDANSETRON HCL 4 MG PO TABS
4.0000 mg | ORAL_TABLET | Freq: Four times a day (QID) | ORAL | Status: DC | PRN
  Administered 2018-01-12: 4 mg via ORAL
  Filled 2018-01-11: qty 1

## 2018-01-11 MED ORDER — ONDANSETRON HCL 4 MG/2ML IJ SOLN
4.0000 mg | Freq: Four times a day (QID) | INTRAMUSCULAR | Status: DC | PRN
  Administered 2018-01-11: 4 mg via INTRAVENOUS
  Filled 2018-01-11: qty 2

## 2018-01-11 MED ORDER — ACETAMINOPHEN 325 MG PO TABS
650.0000 mg | ORAL_TABLET | Freq: Four times a day (QID) | ORAL | Status: DC | PRN
  Administered 2018-01-11 – 2018-01-14 (×3): 650 mg via ORAL
  Filled 2018-01-11 (×3): qty 2

## 2018-01-11 MED ORDER — ONDANSETRON HCL 4 MG/2ML IJ SOLN
4.0000 mg | Freq: Four times a day (QID) | INTRAMUSCULAR | Status: AC | PRN
  Administered 2018-01-11: 4 mg via INTRAVENOUS

## 2018-01-11 MED ORDER — CLINDAMYCIN PHOSPHATE 900 MG/50ML IV SOLN: 900.0000 mg | Freq: Once | INTRAVENOUS | Status: DC

## 2018-01-11 MED ORDER — HYDROMORPHONE HCL 1 MG/ML IJ SOLN
0.5000 mg | INTRAMUSCULAR | Status: DC | PRN
  Administered 2018-01-11 – 2018-01-13 (×2): 0.5 mg via INTRAVENOUS
  Filled 2018-01-11 (×3): qty 0.5

## 2018-01-11 MED ORDER — 0.9 % SODIUM CHLORIDE (POUR BTL) OPTIME
TOPICAL | Status: DC | PRN
  Administered 2018-01-11: 2000 mL

## 2018-01-11 MED ORDER — SODIUM CHLORIDE 0.9% FLUSH
3.0000 mL | Freq: Two times a day (BID) | INTRAVENOUS | Status: DC
  Administered 2018-01-11 – 2018-01-13 (×5): 3 mL via INTRAVENOUS

## 2018-01-11 MED ORDER — MUPIROCIN 2 % EX OINT
1.0000 "application " | TOPICAL_OINTMENT | Freq: Two times a day (BID) | CUTANEOUS | Status: DC
  Administered 2018-01-11 – 2018-01-14 (×7): 1 via NASAL
  Filled 2018-01-11 (×2): qty 22

## 2018-01-11 MED ORDER — FENTANYL CITRATE (PF) 100 MCG/2ML IJ SOLN
INTRAMUSCULAR | Status: DC | PRN
  Administered 2018-01-11: 100 ug via INTRAVENOUS

## 2018-01-11 MED ORDER — HYDROCODONE-ACETAMINOPHEN 5-325 MG PO TABS
1.0000 | ORAL_TABLET | Freq: Four times a day (QID) | ORAL | Status: DC | PRN
  Administered 2018-01-12 – 2018-01-13 (×4): 1 via ORAL
  Filled 2018-01-11 (×2): qty 1
  Filled 2018-01-11: qty 2
  Filled 2018-01-11 (×2): qty 1

## 2018-01-11 MED ORDER — SIMVASTATIN 40 MG PO TABS
40.0000 mg | ORAL_TABLET | Freq: Every day | ORAL | Status: DC
  Administered 2018-01-11 – 2018-01-13 (×3): 40 mg via ORAL
  Filled 2018-01-11 (×3): qty 1

## 2018-01-11 MED ORDER — ALPRAZOLAM 0.25 MG PO TABS
0.2500 mg | ORAL_TABLET | Freq: Three times a day (TID) | ORAL | Status: DC | PRN
  Administered 2018-01-11 – 2018-01-13 (×3): 0.25 mg via ORAL
  Filled 2018-01-11 (×3): qty 1

## 2018-01-11 MED ORDER — FLUOXETINE HCL 20 MG PO CAPS
40.0000 mg | ORAL_CAPSULE | Freq: Every evening | ORAL | Status: DC
  Administered 2018-01-11 – 2018-01-13 (×3): 40 mg via ORAL
  Filled 2018-01-11 (×4): qty 2

## 2018-01-11 MED ORDER — FENTANYL CITRATE (PF) 100 MCG/2ML IJ SOLN
INTRAMUSCULAR | Status: AC
  Filled 2018-01-11: qty 2

## 2018-01-11 MED ORDER — SODIUM CHLORIDE 0.9 % IV SOLN
1.0000 g | Freq: Two times a day (BID) | INTRAVENOUS | Status: DC
  Administered 2018-01-11 – 2018-01-14 (×7): 1 g via INTRAVENOUS
  Filled 2018-01-11 (×8): qty 1

## 2018-01-11 MED ORDER — SODIUM CHLORIDE 0.9 % IV SOLN: 2.0000 g | INTRAVENOUS | Status: DC

## 2018-01-11 MED ORDER — FENTANYL CITRATE (PF) 100 MCG/2ML IJ SOLN: 25.0000 ug | INTRAMUSCULAR | Status: DC | PRN

## 2018-01-11 MED ORDER — PROPOFOL 10 MG/ML IV BOLUS
INTRAVENOUS | Status: DC | PRN
  Administered 2018-01-11: 120 mg via INTRAVENOUS

## 2018-01-11 MED ORDER — ACETAMINOPHEN 650 MG RE SUPP: 650.0000 mg | Freq: Four times a day (QID) | RECTAL | Status: DC | PRN

## 2018-01-11 MED ORDER — METRONIDAZOLE IN NACL 5-0.79 MG/ML-% IV SOLN
500.0000 mg | Freq: Three times a day (TID) | INTRAVENOUS | Status: DC
  Administered 2018-01-11 – 2018-01-14 (×10): 500 mg via INTRAVENOUS
  Filled 2018-01-11 (×10): qty 100

## 2018-01-11 MED ORDER — SODIUM CHLORIDE 0.9 % IV SOLN
INTRAVENOUS | Status: AC
  Administered 2018-01-11 (×2): via INTRAVENOUS

## 2018-01-11 MED ORDER — DEXAMETHASONE SODIUM PHOSPHATE 10 MG/ML IJ SOLN
INTRAMUSCULAR | Status: DC | PRN
  Administered 2018-01-11: 10 mg via INTRAVENOUS

## 2018-01-11 MED ORDER — LIDOCAINE 2% (20 MG/ML) 5 ML SYRINGE
INTRAMUSCULAR | Status: DC | PRN
  Administered 2018-01-11: 50 mg via INTRAVENOUS

## 2018-01-11 MED ORDER — MOMETASONE FURO-FORMOTEROL FUM 200-5 MCG/ACT IN AERO
2.0000 | INHALATION_SPRAY | Freq: Two times a day (BID) | RESPIRATORY_TRACT | Status: DC
  Administered 2018-01-11 – 2018-01-14 (×7): 2 via RESPIRATORY_TRACT
  Filled 2018-01-11: qty 8.8

## 2018-01-11 MED ORDER — PIPERACILLIN-TAZOBACTAM 3.375 G IVPB 30 MIN: 3.3750 g | Freq: Once | INTRAVENOUS | Status: DC

## 2018-01-11 MED ORDER — ALBUTEROL SULFATE (2.5 MG/3ML) 0.083% IN NEBU
2.5000 mg | INHALATION_SOLUTION | RESPIRATORY_TRACT | Status: DC | PRN
  Administered 2018-01-11 (×2): 2.5 mg via RESPIRATORY_TRACT
  Filled 2018-01-11 (×2): qty 3

## 2018-01-11 MED ORDER — SUGAMMADEX SODIUM 200 MG/2ML IV SOLN
INTRAVENOUS | Status: DC | PRN
  Administered 2018-01-11 (×2): 200 mg via INTRAVENOUS

## 2018-01-11 MED ORDER — PROPOFOL 10 MG/ML IV BOLUS
INTRAVENOUS | Status: AC
  Filled 2018-01-11: qty 20

## 2018-01-11 MED ORDER — PHENYLEPHRINE 40 MCG/ML (10ML) SYRINGE FOR IV PUSH (FOR BLOOD PRESSURE SUPPORT)
PREFILLED_SYRINGE | INTRAVENOUS | Status: DC | PRN
  Administered 2018-01-11: 120 ug via INTRAVENOUS
  Administered 2018-01-11: 200 ug via INTRAVENOUS
  Administered 2018-01-11: 80 ug via INTRAVENOUS

## 2018-01-11 MED ORDER — LEVOTHYROXINE SODIUM 50 MCG PO TABS
50.0000 ug | ORAL_TABLET | Freq: Every day | ORAL | Status: DC
  Administered 2018-01-11 – 2018-01-14 (×4): 50 ug via ORAL
  Filled 2018-01-11 (×4): qty 1

## 2018-01-11 MED ORDER — SODIUM CHLORIDE 0.9 % IV SOLN
2.0000 g | Freq: Once | INTRAVENOUS | Status: AC
  Administered 2018-01-11: 2 g via INTRAVENOUS
  Filled 2018-01-11: qty 2

## 2018-01-11 SURGICAL SUPPLY — 43 items
BENZOIN TINCTURE PRP APPL 2/3 (GAUZE/BANDAGES/DRESSINGS) IMPLANT
BLADE HEX COATED 2.75 (ELECTRODE) ×4 IMPLANT
BLADE SURG 15 STRL LF DISP TIS (BLADE) ×2 IMPLANT
BLADE SURG 15 STRL SS (BLADE)
BLADE SURG SZ10 CARB STEEL (BLADE) ×2 IMPLANT
BNDG GAUZE ELAST 4 BULKY (GAUZE/BANDAGES/DRESSINGS) ×2 IMPLANT
CLOSURE WOUND 1/2 X4 (GAUZE/BANDAGES/DRESSINGS)
COVER SURGICAL LIGHT HANDLE (MISCELLANEOUS) ×3 IMPLANT
COVER WAND RF STERILE (DRAPES) ×2 IMPLANT
DECANTER SPIKE VIAL GLASS SM (MISCELLANEOUS) IMPLANT
DRAPE LAPAROSCOPIC ABDOMINAL (DRAPES) ×2 IMPLANT
DRAPE LAPAROTOMY T 102X78X121 (DRAPES) IMPLANT
DRAPE LAPAROTOMY TRNSV 102X78 (DRAPE) IMPLANT
DRAPE POUCH INSTRU U-SHP 10X18 (DRAPES) ×1 IMPLANT
DRAPE SHEET LG 3/4 BI-LAMINATE (DRAPES) IMPLANT
ELECT PENCIL ROCKER SW 15FT (MISCELLANEOUS) ×2 IMPLANT
ELECT REM PT RETURN 15FT ADLT (MISCELLANEOUS) ×4 IMPLANT
EVACUATOR SILICONE 100CC (DRAIN) IMPLANT
GAUZE SPONGE 4X4 12PLY STRL (GAUZE/BANDAGES/DRESSINGS) ×4 IMPLANT
GLOVE BIOGEL PI IND STRL 7.0 (GLOVE) ×1 IMPLANT
GLOVE BIOGEL PI INDICATOR 7.0 (GLOVE) ×2
GLOVE EUDERMIC 7 POWDERFREE (GLOVE) ×6 IMPLANT
GOWN STRL REUS W/TWL LRG LVL3 (GOWN DISPOSABLE) ×3 IMPLANT
GOWN STRL REUS W/TWL XL LVL3 (GOWN DISPOSABLE) ×6 IMPLANT
KIT BASIN OR (CUSTOM PROCEDURE TRAY) ×6 IMPLANT
NDL HYPO 25X1 1.5 SAFETY (NEEDLE) ×2 IMPLANT
NEEDLE HYPO 25X1 1.5 SAFETY (NEEDLE) IMPLANT
NS IRRIG 1000ML POUR BTL (IV SOLUTION) ×6 IMPLANT
PACK BASIC VI WITH GOWN DISP (CUSTOM PROCEDURE TRAY) ×2 IMPLANT
PACK GENERAL/GYN (CUSTOM PROCEDURE TRAY) ×2 IMPLANT
PAD ABD 7.5X8 STRL (GAUZE/BANDAGES/DRESSINGS) ×2 IMPLANT
SOL PREP POV-IOD 4OZ 10% (MISCELLANEOUS) ×1 IMPLANT
SPONGE LAP 18X18 RF (DISPOSABLE) ×1 IMPLANT
SPONGE LAP 4X18 RFD (DISPOSABLE) ×2 IMPLANT
STAPLER VISISTAT 35W (STAPLE) IMPLANT
STRIP CLOSURE SKIN 1/2X4 (GAUZE/BANDAGES/DRESSINGS) IMPLANT
SUT MNCRL AB 4-0 PS2 18 (SUTURE) IMPLANT
SUT VIC AB 3-0 SH 18 (SUTURE) IMPLANT
SYR CONTROL 10ML LL (SYRINGE) ×2 IMPLANT
TAPE CLOTH SURG 6X10 WHT LF (GAUZE/BANDAGES/DRESSINGS) ×2 IMPLANT
TOWEL OR 17X26 10 PK STRL BLUE (TOWEL DISPOSABLE) ×3 IMPLANT
WATER STERILE IRR 1000ML POUR (IV SOLUTION) ×1 IMPLANT
YANKAUER SUCT BULB TIP 10FT TU (MISCELLANEOUS) IMPLANT

## 2018-01-11 NOTE — Anesthesia Preprocedure Evaluation (Signed)
Anesthesia Evaluation  Patient identified by MRN, date of birth, ID band Patient awake    Reviewed: Allergy & Precautions, H&P , NPO status , Patient's Chart, lab work & pertinent test results  History of Anesthesia Complications (+) PONV and history of anesthetic complications  Airway Mallampati: II   Neck ROM: full    Dental   Pulmonary COPD, Current Smoker,    breath sounds clear to auscultation       Cardiovascular hypertension, + CAD, + CABG, + Peripheral Vascular Disease and +CHF  + Valvular Problems/Murmurs  Rhythm:regular Rate:Normal  S/p TAVR   Neuro/Psych PSYCHIATRIC DISORDERS Anxiety Depression    GI/Hepatic hiatal hernia, GERD  ,  Endo/Other  Hypothyroidism   Renal/GU Renal diseaseS/p nephrectomy 2 weeks ago.     Musculoskeletal   Abdominal   Peds  Hematology   Anesthesia Other Findings   Reproductive/Obstetrics                             Anesthesia Physical Anesthesia Plan  ASA: III and emergent  Anesthesia Plan: General   Post-op Pain Management:    Induction: Intravenous  PONV Risk Score and Plan: 3 and Ondansetron, Dexamethasone and Treatment may vary due to age or medical condition  Airway Management Planned: Oral ETT  Additional Equipment:   Intra-op Plan:   Post-operative Plan: Extubation in OR and Possible Post-op intubation/ventilation  Informed Consent: I have reviewed the patients History and Physical, chart, labs and discussed the procedure including the risks, benefits and alternatives for the proposed anesthesia with the patient or authorized representative who has indicated his/her understanding and acceptance.     Plan Discussed with: CRNA, Anesthesiologist and Surgeon  Anesthesia Plan Comments:         Anesthesia Quick Evaluation

## 2018-01-11 NOTE — Progress Notes (Signed)
Pharmacy Antibiotic Note  [name] is a 78 y.o. female admitted on 01/10/2018 with abscess at surgical incision site abdominal wall.  Pharmacy has been consulted for Vancomycin, cefepime dosing.  Metronidazole per MD.  - Tm 101.3 - WBC 14.9 - SCr improved to 1.74.  Baseline SCr 0.86 (05/2017), but then had R nephrectomy 12/28/17 with post nephrectomy SCr ~ 1.6-1.7  Plan:  Metronidazole 500 mg IV q8h  Cefepime 2g X1 then 1g IV q12h.  Vancomycin 1750 mg IV x1 then 1g IV q36h.  (SCr 1.74, Est AUC 510)  Check vancomycin levels if remains on vancomycin > 3-4 days.  Goal AUC 400-550.  Follow up renal fxn, culture results, and clinical course.  F/u ability to de-escalate antibiotics.   Height: 5\' 7"  (170.2 cm) Weight: 209 lb (94.8 kg) IBW/kg (Calculated) : 61.6  Temp (24hrs), Avg:99.6 F (37.6 C), Min:98.5 F (36.9 C), Max:101.3 F (38.5 C)  Recent Labs  Lab 01/10/18 2203 01/10/18 2216 01/11/18 0230 01/11/18 0524  WBC  --  18.1*  --  14.9*  CREATININE  --  2.01*  --  1.74*  LATICACIDVEN 1.17  --  0.78  --     Estimated Creatinine Clearance: 33 mL/min (A) (by C-G formula based on SCr of 1.74 mg/dL (H)).    Allergies  Allergen Reactions  . Chantix [Varenicline Tartrate] Hives  . Dipyridamole Hives  . Varenicline Hives    Antimicrobials this admission:  1/2 Vancomycin >> 1/2 Cefepime >> 1/2 Flagyl >>   Dose adjustments this admission:  1/2 antibiotics empirically adjusted for renal function.  Microbiology results:  1/2 BCx: sent 1/1 UCx: sent  1/2 abscess:  mod GNCB, few GPC pairs  Thank you for allowing pharmacy to be a part of this patient's care.  Gretta Arab PharmD, BCPS Pager [phone] 01/11/2018 9:08 AM

## 2018-01-11 NOTE — H&P (Signed)
History and Physical    [name] OJJ:[id number] DOB: 1940-04-19 DOA: 01/10/2018  PCP: [name], MD   Patient coming from: Home   Chief Complaint: Malaise, N/V, loss of appetite    HPI: [name] is a 78 y.o. female, former Therapist, sports at [employer], with medical history significant for hypothyroidism, CAD status post CABG, status post TAVR, chronic diastolic CHF, COPD, anxiety, and right renal mass status post right nephrectomy on 12/28/2017, now presenting to the emergency department with loss of appetite, generalized weakness, and nausea with nonbloody vomiting.  Patient reports that she had seem to be recovering from her surgery well at home until 1 to 2 days ago when she began to develop generalized weakness with loss of appetite.  Her condition worsened significantly last night with recurrent nausea and nonbloody vomiting, and increased weakness in general.  She had not appreciated a fever and denies any drainage from her surgical site.  She reports only mild abdominal pain.  Denies cough, chest pain, or dysuria.  ED Course: Upon arrival to the ED, patient is found to be febrile to 38.3 C, tachypneic, and with blood pressure one 1/51.  Chemistry panel is notable for sodium of 133 and creatinine of 2.01, up from 1.66 two weeks earlier.  CBC is notable for leukocytosis to 18,100.  Chest x-ray features stable mild cardiomegaly without edema or acute pulmonary disease.  CT of the abdomen and pelvis is concerning for an anterior wall abscess measuring up to 11 cm at the recent surgical incision site, without any intraperitoneal abscess or collection.  Blood and urine culture was collected, a liter of normal saline was administered, and the patient was treated with clindamycin, Zosyn, and vancomycin in the ED.  Urology was consulted by the ED physician, plans to drain the abscess and recommended medical admission.   Review of Systems:  All other systems reviewed and apart from HPI, are  negative.  Past Medical History:  Diagnosis Date  . Abdominal aortic aneurysm ([place])    infrarenal diagnosed with maximum measurement at 3.3 cm  . Coronary artery disease   . Depression   . Diverticulosis   . GERD (gastroesophageal reflux disease)   . History of tobacco abuse    Recurrent cigarette smoking  . Hyperlipidemia   . Hyperplastic colon polyp   . Hypertension   . Hypothyroidism   . Ischemic heart disease    had redo bypass surgery in 2001, at that time she had a free right internal mammary to the LAD, LAD endarterectomy, sequential vein graft to the OM1 and OM2 -- last catheterization was in 2008  . Leg fracture    remote right leg fracture  . Microscopic colitis   . Obesity    with gastric bypass surgery in 2004  . PAD (peripheral artery disease) (H[facility])    right iliac occlusion  . PONV (postoperative nausea and vomiting)   . Right kidney mass   . S/P CABG x 4 1985   LIMA to LAD, Sequential SVG to OM1-OM2, SVG to RCA - Dr Redmond Pulling  . S/P redo CABG x 3 2001   Free RIMA to LAD with endarterectomy, sequential SVG to OM1-OM2 - Dr Servando Snare  . 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 ARTERIAL LINE INSERTION Right 05/09/2017   Procedure: ARTERIAL LINE  INSERTION  RIGHT RADIAL ARTERY;  Surgeon: Sherren Mocha, MD;  Location: [place];  Service: Open Heart Surgery;  Laterality: Right;  . CARDIAC CATHETERIZATION  02/16/1999   Left heart catheterization with selective coronary  angiography, left ventricular angiography (RAO and LAO views), angiography of left internal mammary artery, and angiography of saphenous vein grafts  . CARDIAC CATHETERIZATION  10/20/2006   Est. EF of 55% -- Totally occuladed native coronary circulation -- Persistent patency of the right mammary graft and the distal LAD artery with  peristent patency of the left mammary graft to the proximal LAD artery with persistant patency of the saphenous vein graft to the sequentail branches of the OM        . CARPAL TUNNEL RELEASE    . CHOLECYSTECTOMY    . COLONOSCOPY  07/12/2007   Dr.Brodie- normal cecum and rectum, diverticulosis in the sigmoid colon  . COLONOSCOPY  1991   per Dr.Brodie office visit note= hyperplastic polyp  . COLONOSCOPY N/A 09/30/2014   Dr.Rourk- noraml appearing rectal mucosa, scattered L sided diverticula, colonic mucosa has a somewhat pale friable appearence diffusely, no ulcers or erosions seen, the distal 10cm of terminal ileum appeared entirely normal. bx=benign colorectal mucosa with lymphocytic colitis  . CORONARY ARTERY BYPASS GRAFT  1985  . CORONARY ARTERY BYPASS GRAFT  03/09/1999   Redo CABG x3 --  with the free right internal mammary artey to the LAD with an LAD endarterectomy -- Sequential saphenous vein graft to OM1 and OM2                   . CYSTOSCOPY/RETROGRADE/URETEROSCOPY Right 12/21/2017   Procedure: [id number];  Surgeon: Raynelle Bring, MD;  Location: [facility];  Service: Urology;  Laterality: Right;  . ESOPHAGOGASTRODUODENOSCOPY N/A 09/30/2014   Dr.Rourk-mild erosive reflux esophagitis s/p bariatric surgery  . EYE SURGERY     catracts  . GASTRIC BYPASS  2004  . LAPAROSCOPIC NEPHRECTOMY Right 12/28/2017   Procedure: LAPAROSCOPIC RADICAL NEPHRECTOMY;  Surgeon: Raynelle Bring, MD;  Location: [facility];  Service: Urology;  Laterality: Right;  . RIGHT/LEFT HEART CATH AND CORONARY/GRAFT ANGIOGRAPHY N/A 04/04/2017   Procedure: RIGHT/LEFT HEART CATH AND CORONARY/GRAFT ANGIOGRAPHY;  Surgeon: Martinique, Peter M, MD;  Location: [facility];  Service: Cardiovascular;  Laterality: N/A;  . TEE WITHOUT CARDIOVERSION N/A 05/09/2017   Procedure: TRANSESOPHAGEAL ECHOCARDIOGRAM (TEE);  Surgeon: Sherren Mocha, MD;  Location: [place];  Service: Open Heart Surgery;  Laterality: N/A;  . TRANSCATHETER AORTIC  VALVE REPLACEMENT, TRANSFEMORAL N/A 05/09/2017   Procedure: TRANSCATHETER AORTIC VALVE REPLACEMENT, TRANSFEMORAL using a 64mm Edwards Sapien 3 Aortic Valve;  Surgeon: Sherren Mocha, MD;  Location: [place];  Service: Open Heart Surgery;  Laterality: N/A;     reports that she has been smoking cigarettes. She started smoking about 33 years ago. She has been smoking about 0.50 packs per day. She has never used smokeless tobacco. She reports current alcohol use. She reports that she does not use drugs.  Allergies  Allergen Reactions  . Chantix [Varenicline Tartrate] Hives  . Dipyridamole Hives  . Varenicline Hives    Family History  Problem Relation Age of Onset  . Stroke Father   . Diabetes Mother   . Pancreatic cancer Mother      Prior to Admission medications   Medication Sig Start Date End Date Taking? Authorizing Provider  acetaminophen (TYLENOL) 500 MG tablet Take 1,000 mg by mouth every 6 (six) hours as needed for moderate pain or headache.    Yes [provider]  ALPRAZolam (XANAX) 0.25 MG tablet Take 0.25 mg by mouth 3 (three) times daily as needed for anxiety. 03/20/17  Yes [provider]  amLODipine (NORVASC) 10 MG tablet TAKE 1 TABLET BY MOUTH EVERY DAY 05/17/17  Yes Martinique, Peter M, MD  amoxicillin (AMOXIL) 500 MG tablet Take 4 tablets (2,000 mg total) by mouth as directed. Take 1 hour prior to dental visits. 06/01/17  Yes Eileen Stanford, PA-C  aspirin EC 81 MG tablet Take 81 mg by mouth at bedtime.    Yes [provider]  BYSTOLIC 20 MG TABS TAKE 1 TABLET BY MOUTH EVERY DAY Patient taking differently: Take 20 mg by mouth every evening. At bedtime 08/14/17  Yes Martinique, Peter M, MD  calcium elemental as carbonate (BARIATRIC TUMS ULTRA) 400 MG chewable tablet Chew 1,000 mg by mouth 3 (three) times daily as needed for heartburn.   Yes [provider]  chlorthalidone (HYGROTON) 25 MG tablet Take 12.5 mg by mouth daily.   Yes [provider]  FLUoxetine (PROZAC) 40 MG capsule Take 40 mg by mouth every evening.   Yes [provider]  furosemide (LASIX) 20 MG tablet Take 20 mg by mouth daily as needed for edema.   Yes [provider]  irbesartan (AVAPRO) 300 MG tablet TAKE 1 TABLET BY MOUTH EVERY DAY 08/14/17  Yes Martinique, Peter M, MD  levothyroxine (SYNTHROID, LEVOTHROID) 50 MCG tablet Take 50 mcg by mouth daily before breakfast.   Yes [provider]  loperamide (IMODIUM) 2 MG capsule Take 2-4 mg by mouth 4 (four) times daily as needed for diarrhea or loose stools.    Yes [provider]  nitroGLYCERIN (NITROSTAT) 0.4 MG SL tablet Place 0.4 mg under the tongue every 5 (five) minutes as needed for chest pain (MAX 3 TABLETS).   Yes [provider]  ondansetron (ZOFRAN) 4 MG tablet Take 1 tablet (4 mg total) by mouth every 8 (eight) hours as needed for nausea or vomiting. 12/31/17  Yes Ardis Hughs, MD  pantoprazole (PROTONIX) 40 MG tablet Take 1 tablet (40 mg total) by mouth daily. 07/15/16  Yes Mahala Menghini, PA-C  PROAIR HFA 108 [phone] Base) MCG/ACT inhaler Inhale 2 puffs into the lungs every 4 (four) hours as needed for shortness of breath or wheezing. 03/14/17  Yes [provider]  simethicone (MYLICON) 408 MG chewable tablet Chew 125 mg by mouth every 6 (six) hours as needed for flatulence.   Yes [provider]  simvastatin (ZOCOR) 40 MG tablet Take 1 tablet (40 mg total) by mouth at bedtime. 11/15/17  Yes Martinique, Peter M, MD  SYMBICORT 160-4.5 MCG/ACT inhaler Inhale 1 puff into the lungs 2 (two) times daily as needed (for respiratory issues.).  03/14/17  Yes [provider]  traMADol (ULTRAM) 50 MG tablet Take 1-2 tablets (50-100 mg total) by mouth every 6 (six) hours as needed for moderate pain or severe pain. 12/28/17  Yes Debbrah Alar, PA-C    Physical Exam: Vitals:   01/11/18 0300 01/11/18 0330 01/11/18 0400 01/11/18 0430  BP: 114/68 (!) 110/55 103/75  122/60  Pulse: 77 79 77 79  Resp: (!) 36 (!) 35 (!) 39 (!) 27  Temp:      TempSrc:      SpO2: 98% 98% 98% 100%  Weight:      Height:        Constitutional: NAD, calm  Eyes: PERTLA, lids and conjunctivae normal ENMT: Mucous membranes are  moist. Posterior pharynx clear of any exudate or lesions.   Neck: normal, supple, no masses, no thyromegaly Respiratory: Wheezing, no rhonchi, no crackles. Mild dyspnea with speech. No accessory muscle recruitment.    Cardiovascular: S1 & S2 heard, regular rate and rhythm. No extremity edema.   JVD. Abdomen: No distension, soft, focal area of periumbilical swelling and fluctuance without drainage. Bowel sounds active.  Musculoskeletal: no clubbing / cyanosis. No joint deformity upper and lower extremities.    Skin: no significant rashes, lesions, ulcers. Warm, dry, well-perfused. Neurologic: No facial asymmetry. Sensation intact. Moving all extremities.  Psychiatric: Alert and oriented to person, place, and situation. Pleasant and cooperative.    Labs on Admission: I have personally reviewed following labs and imaging studies  CBC: Recent Labs  Lab 01/10/18 2216  WBC 18.1*  NEUTROABS 15.7*  HGB 11.1*  HCT 35.1*  MCV 100.9*  PLT 875   Basic Metabolic Panel: Recent Labs  Lab 01/10/18 2216  NA 133*  K 4.3  CL 102  CO2 22  GLUCOSE 139*  BUN 29*  CREATININE 2.01*  CALCIUM 9.4   GFR: Estimated Creatinine Clearance: 28.6 mL/min (A) (by C-G formula based on SCr of 2.01 mg/dL (H)). Liver Function Tests: Recent Labs  Lab 01/10/18 2216  AST 14*  ALT 8  ALKPHOS 64  BILITOT 1.2  PROT 6.9  ALBUMIN 3.0*   No results for input(s): LIPASE, AMYLASE in the last 168 hours. No results for input(s): AMMONIA in the last 168 hours. Coagulation Profile: Recent Labs  Lab 01/10/18 2216  INR 1.01   Cardiac Enzymes: No results for input(s): CKTOTAL, CKMB, CKMBINDEX, TROPONINI in the last 168 hours. BNP (last 3 results) No results for  input(s): PROBNP in the last 8760 hours. HbA1C: No results for input(s): HGBA1C in the last 72 hours. CBG: No results for input(s): GLUCAP in the last 168 hours. Lipid Profile: No results for input(s): CHOL, HDL, LDLCALC, TRIG, CHOLHDL, LDLDIRECT in the last 72 hours. Thyroid Function Tests: No results for input(s): TSH, T4TOTAL, FREET4, T3FREE, THYROIDAB in the last 72 hours. Anemia Panel: No results for input(s): VITAMINB12, FOLATE, FERRITIN, TIBC, IRON, RETICCTPCT in the last 72 hours. Urine analysis:    Component Value Date/Time   COLORURINE YELLOW 01/10/2018 2216   APPEARANCEUR CLEAR 01/10/2018 2216   LABSPEC 1.016 01/10/2018 2216   PHURINE 5.0 01/10/2018 2216   GLUCOSEU NEGATIVE 01/10/2018 2216   HGBUR MODERATE (A) 01/10/2018 2216   [place]ise 01/10/2018 2216   KETONESUR NEGATIVE 01/10/2018 2216   PROTEINUR NEGATIVE 01/10/2018 2216   UROBILINOGEN 0.2 10/24/2007 1052   NITRITE NEGATIVE 01/10/2018 2216   LEUKOCYTESUR NEGATIVE 01/10/2018 2216   Sepsis Labs: @LABRCNTIP (procalcitonin:4,lacticidven:4) )No results found for this or any previous visit (from the past 240 hour(s)).   Radiological Exams on Admission: Ct Abdomen Pelvis Wo Contrast  Result Date: 01/11/2018 CLINICAL DATA:  Nausea and vomiting. Recent fall. History of renal cell carcinoma. EXAM: CT ABDOMEN AND PELVIS WITHOUT CONTRAST TECHNIQUE: Multidetector CT imaging of the abdomen and pelvis was performed following the standard protocol without IV contrast. COMPARISON:  CT abdomen pelvis 05/24/2017 FINDINGS: LOWER CHEST: There is no basilar pleural or apical pericardial effusion. Coronary artery and aortic calcifications. HEPATOBILIARY: The hepatic contours and density are normal. There is no intra- or extrahepatic biliary dilatation. Status post cholecystectomy. PANCREAS: The pancreatic parenchymal contours are normal and there is no ductal dilatation. There is no peripancreatic fluid collection. SPLEEN: Normal.  ADRENALS/URINARY TRACT: --Adrenal glands: Normal. --Right kidney/ureter: Surgically absent. --  Left kidney/ureter: No hydronephrosis, nephroureterolithiasis, perinephric stranding or solid renal mass. --Urinary bladder: Normal for degree of distention STOMACH/BOWEL: --Stomach/Duodenum: There is no hiatal hernia or other gastric abnormality. The duodenal course and caliber are normal. --Small bowel: No dilatation or inflammation. --Colon: No focal abnormality. --Appendix: Not visualized. No right lower quadrant inflammation or free fluid. VASCULAR/LYMPHATIC: There is aortic atherosclerosis without hemodynamically significant stenosis. No abdominal or pelvic lymphadenopathy. REPRODUCTIVE: Normal uterus. No adnexal mass. MUSCULOSKELETAL. Multilevel degenerative disc disease and facet arthrosis. No bony spinal canal stenosis. OTHER: There is an abscess containing gas and fluid within the anterior abdominal wall at the incision site. Greatest dimensions measure 6.9 x 11.2 x 2.2 cm. IMPRESSION: 1. Anterior abdominal wall abscess at recent surgical incision site, measuring up to 11 cm. No intraperitoneal abscess or collection. 2. Status post right nephrectomy. 3. Coronary artery and aortic Atherosclerosis (ICD10-I70.0). Electronically Signed   By: Ulyses Jarred M.D.   On: 01/11/2018 02:02   Dg Chest 2 View  Result Date: 01/10/2018 CLINICAL DATA:  Sepsis EXAM: CHEST - 2 VIEW COMPARISON:  12/06/2017 chest radiograph. FINDINGS: Stable sternotomy wires with discontinuity in third sternotomy wire from the top. Status post TAVR. Stable cardiomediastinal silhouette with mild cardiomegaly. No pneumothorax. No pleural effusion. No overt pulmonary edema. No acute consolidative airspace disease. IMPRESSION: Stable mild cardiomegaly without overt pulmonary edema. No active pulmonary disease. Electronically Signed   By: Ilona Sorrel M.D.   On: 01/10/2018 22:48    EKG: Not performed.   Assessment/Plan   1. Abdominal wall  abscess  - Presents with nausea, vomiting, loss of appetite, and general malaise  - Found to be febrile with leukocytosis and abdominal wall abscess at site of surgical incision from nephrectomy on 12/28/17  - Urology is consulting and much appreciated  - Blood culture collected in ED, IVF and empiric antibiotics given  - Keep NPO, continue IVF hydration, continue empiric antibiotics     2. AKI   - SCr is 2.01 on admission, up from 1.66 on 12/21, and had been <1 prior to recent surgery  - Likely prerenal azotemia in setting of poor appetite and N/V with low BP; nephrectomy and continued Lasix and ARB use likely contributing  - Given a liter of NS in ED  - Check urine chemistries, continue IVF hydration, hold ARB and diuretic, repeat chem panel in am   3. 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 COPD  - Denies SOB or cough but is wheezing in ED and has mild dyspnea with speech  - Continue ICS/LABA, albuterol neb now and then q4h as needed    5. Chronic diastolic CHF  - Appears compensated, likely hypovolemic in setting of poor appetite, N/V  - Given a liter of NS in ED  - Hold Lasix, ARB, and beta-blocker until renal fxn and BP improve  - Continue IVF hydration while NPO, follow daily wt and I/O's   6. Hypertension  - Was hypotensive in ED, improved with IVF, and antihypertensives will be held initially   7. Hypothyroidism  - Continue Synthroid    8. CAD - No anginal complaints  - Continue statin, hold ARB and beta-blocker until BP and renal function improves     DVT prophylaxis: SCD's  Code Status: Full  Family Communication: Discussed with patient  Consults called: Urology  Admission status: Observation  Vianne Bulls, MD [facility] Pager [phone]  If 7PM-7AM, please contact night-coverage [email or web address] Password [email or web address]  01/11/2018, 4:53  AM

## 2018-01-11 NOTE — Progress Notes (Signed)
Surgical PCR was not obtained prior to patient going to surgery. Writer received patient post op and found that the PCR was not obtained. RN performed surgical PCR and awaiting results. Will continue to monitor and initiate standing orders if positive.

## 2018-01-11 NOTE — Progress Notes (Signed)
Pt admitted from ED. Skin assessment completed with Sharyn Lull, RN. Pt has laceration to left thumb (r/t EMS spO2 cord). Pt has three small laproscopic sites to right abdomen with dermabond and one umbilical that has a scant amount of drainage - Dr. Alinda Money aware. No other skin breakdown noted. Pt AAOx4 and oriented to room/unit. Callbell within reach and side rails upx3. Bed low and bed alarm on.

## 2018-01-11 NOTE — Progress Notes (Signed)
CRITICAL VALUE ALERT  Critical Value: + MRSA PCR, + STAPH PCR  Date & Time Notied: 01/11/2018 1602  Orders Received/Actions taken: Standing orders initiated.

## 2018-01-11 NOTE — ED Notes (Signed)
ED TO INPATIENT HANDOFF REPORT  Name/Age/Gender [name] 78 y.o. female  Code Status Code Status History    Date Active Date Inactive Code Status Order ID Comments User Context   12/28/2017 1711 12/31/2017 1549 Full Code [id number]  Raynelle Bring, MD Inpatient   05/09/2017 1246 05/11/2017 1902 Full Code [id number]  Sherren Mocha, MD Inpatient   04/04/2017 1041 04/04/2017 1635 Full Code [id number]  Martinique, Peter M, MD Inpatient      Home/SNF/Other Home  Chief Complaint Abdominal Pain; Nausea  Level of Care/Admitting Diagnosis ED Disposition    ED Disposition Condition [place]: [facility] [[id number]]  Level of Care: Telemetry [5]  Admit to tele based on following criteria: Other see comments  Comments: sepsis  Diagnosis: Abdominal wall abscess at site of surgical wound [[id number]]  Admitting Physician: Vianne Bulls [[id number]]  Attending Physician: Vianne Bulls [[id number]]  PT Class (Do Not Modify): Observation [104]  PT Acc Code (Do Not Modify): Observation [10022]       Medical History Past Medical History:  Diagnosis Date  . Abdominal aortic aneurysm ([place])    infrarenal diagnosed with maximum measurement at 3.3 cm  . Coronary artery disease   . Depression   . Diverticulosis   . GERD (gastroesophageal reflux disease)   . History of tobacco abuse    Recurrent cigarette smoking  . Hyperlipidemia   . Hyperplastic colon polyp   . Hypertension   . Hypothyroidism   . Ischemic heart disease    had redo bypass surgery in 2001, at that time she had a free right internal mammary to the LAD, LAD endarterectomy, sequential vein graft to the OM1 and OM2 -- last catheterization was in 2008  . Leg fracture    remote right leg fracture  . Microscopic colitis   . Obesity    with gastric bypass surgery in 2004  . PAD (peripheral artery disease) (HCC)    right iliac occlusion  . PONV (postoperative nausea and vomiting)   . Right  kidney mass   . S/P CABG x 4 1985   LIMA to LAD, Sequential SVG to OM1-OM2, SVG to RCA - Dr Redmond Pulling  . S/P redo CABG x 3 2001   Free RIMA to LAD with endarterectomy, sequential SVG to OM1-OM2 - Dr Servando Snare  . 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 Chantix [Varenicline Tartrate] Hives  . Dipyridamole Hives  . Varenicline Hives    IV Location/Drains/Wounds Patient Lines/Drains/Airways Status   Active Line/Drains/Airways    Name:   Placement date:   Placement time:   Site:   Days:   Peripheral IV 01/10/18 Right;Lateral Wrist   01/10/18    2210    Wrist   1   Peripheral IV 01/10/18 Left Antecubital   01/10/18    2205    Antecubital   1   Incision (Closed) 05/09/17 Groin Left   05/09/17    1217     247   Incision (Closed) 05/09/17 Groin Right   05/09/17    1217     247   Incision (Closed) 05/09/17 Wrist Right   05/09/17    1217     247   Incision (Closed) 12/28/17 Abdomen   12/28/17    1343     14   Incision -  2 Ports Abdomen Right;Lateral 2: Right;Upper   12/28/17    1330     14          Labs/Imaging Results for orders placed or performed during the hospital encounter of 01/10/18 (from the past 48 hour(s))  I-Stat CG4 Lactic Acid, ED     Status: None   Collection Time: 01/10/18 10:03 PM  Result Value Ref Range   Lactic Acid, Venous 1.17 0.5 - 1.9 mmol/L  Comprehensive metabolic panel     Status: Abnormal   Collection Time: 01/10/18 10:16 PM  Result Value Ref Range   Sodium 133 (L) 135 - 145 mmol/L   Potassium 4.3 3.5 - 5.1 mmol/L   Chloride 102 98 - 111 mmol/L   CO2 22 22 - 32 mmol/L   Glucose, Bld 139 (H) 70 - 99 mg/dL   BUN 29 (H) 8 - 23 mg/dL   Creatinine, Ser 2.01 (H) 0.44 - 1.00 mg/dL   Calcium 9.4 8.9 - 10.3 mg/dL   Total Protein 6.9 6.5 - 8.1 g/dL   Albumin 3.0 (L) 3.5 - 5.0 g/dL   AST 14 (L) 15 -  41 U/L   ALT 8 0 - 44 U/L   Alkaline Phosphatase 64 38 - 126 U/L   Total Bilirubin 1.2 0.3 - 1.2 mg/dL   GFR calc non Af Amer 24 (L) >60 mL/min   GFR calc Af Amer 27 (L) >60 mL/min   Anion gap 9 5 - 15    Comment: Performed at [facility], [place] [address], [place], [place] [address]  CBC with Differential     Status: Abnormal   Collection Time: 01/10/18 10:16 PM  Result Value Ref Range   WBC 18.1 (H) 4.0 - 10.5 K/uL   RBC 3.48 (L) 3.87 - 5.11 MIL/uL   Hemoglobin 11.1 (L) 12.0 - 15.0 g/dL   HCT 35.1 (L) 36.0 - 46.0 %   MCV 100.9 (H) 80.0 - 100.0 fL   MCH 31.9 26.0 - 34.0 pg   MCHC 31.6 30.0 - 36.0 g/dL   RDW 12.8 11.5 - 15.5 %   Platelets 200 150 - 400 K/uL   nRBC 0.0 0.0 - 0.2 %   Neutrophils Relative % 87 %   Neutro Abs 15.7 (H) 1.7 - 7.7 K/uL   Lymphocytes Relative 5 %   Lymphs Abs 0.8 0.7 - 4.0 K/uL   Monocytes Relative 7 %   Monocytes Absolute 1.3 (H) 0.1 - 1.0 K/uL   Eosinophils Relative 0 %   Eosinophils Absolute 0.0 0.0 - 0.5 K/uL   Basophils Relative 0 %   Basophils Absolute 0.1 0.0 - 0.1 K/uL   Immature Granulocytes 1 %   Abs Immature Granulocytes 0.21 (H) 0.00 - 0.07 K/uL    Comment: Performed at [facility], [place] [address], [place], [place] [address]  Protime-INR     Status: None   Collection Time: 01/10/18 10:16 PM  Result Value Ref Range   Prothrombin Time 13.2 11.4 - 15.2 seconds   INR 1.01     Comment: Performed at [facility], [place] [address], [place], [place] [address]  Urinalysis, Routine w reflex microscopic     Status: Abnormal   Collection Time: 01/10/18 10:16 PM  Result Value Ref Range   Color, Urine YELLOW YELLOW   APPearance CLEAR CLEAR   Specific Gravity, Urine 1.016 1.005 - 1.030   pH 5.0 5.0 - 8.0   Glucose, UA NEGATIVE NEGATIVE mg/dL  Hgb urine dipstick MODERATE (A) NEGATIVE   Bilirubin Urine NEGATIVE NEGATIVE   Ketones, ur NEGATIVE NEGATIVE mg/dL   Protein, ur NEGATIVE NEGATIVE  mg/dL   Nitrite NEGATIVE NEGATIVE   Leukocytes, UA NEGATIVE NEGATIVE   RBC / HPF 6-10 0 - 5 RBC/hpf   WBC, UA 0-5 0 - 5 WBC/hpf   Bacteria, UA NONE SEEN NONE SEEN   Squamous Epithelial / LPF 0-5 0 - 5   Mucus PRESENT    Hyaline Casts, UA PRESENT    Amorphous Crystal PRESENT     Comment: Performed at [facility], [place] [address], [place], [place] [address]  I-Stat CG4 Lactic Acid, ED     Status: None   Collection Time: 01/11/18  2:30 AM  Result Value Ref Range   Lactic Acid, Venous 0.78 0.5 - 1.9 mmol/L   Ct Abdomen Pelvis Wo Contrast  Result Date: 01/11/2018 CLINICAL DATA:  Nausea and vomiting. Recent fall. History of renal cell carcinoma. EXAM: CT ABDOMEN AND PELVIS WITHOUT CONTRAST TECHNIQUE: Multidetector CT imaging of the abdomen and pelvis was performed following the standard protocol without IV contrast. COMPARISON:  CT abdomen pelvis 05/24/2017 FINDINGS: LOWER CHEST: There is no basilar pleural or apical pericardial effusion. Coronary artery and aortic calcifications. HEPATOBILIARY: The hepatic contours and density are normal. There is no intra- or extrahepatic biliary dilatation. Status post cholecystectomy. PANCREAS: The pancreatic parenchymal contours are normal and there is no ductal dilatation. There is no peripancreatic fluid collection. SPLEEN: Normal. ADRENALS/URINARY TRACT: --Adrenal glands: Normal. --Right kidney/ureter: Surgically absent. --Left kidney/ureter: No hydronephrosis, nephroureterolithiasis, perinephric stranding or solid renal mass. --Urinary bladder: Normal for degree of distention STOMACH/BOWEL: --Stomach/Duodenum: There is no hiatal hernia or other gastric abnormality. The duodenal course and caliber are normal. --Small bowel: No dilatation or inflammation. --Colon: No focal abnormality. --Appendix: Not visualized. No right lower quadrant inflammation or free fluid. VASCULAR/LYMPHATIC: There is aortic atherosclerosis without hemodynamically  significant stenosis. No abdominal or pelvic lymphadenopathy. REPRODUCTIVE: Normal uterus. No adnexal mass. MUSCULOSKELETAL. Multilevel degenerative disc disease and facet arthrosis. No bony spinal canal stenosis. OTHER: There is an abscess containing gas and fluid within the anterior abdominal wall at the incision site. Greatest dimensions measure 6.9 x 11.2 x 2.2 cm. IMPRESSION: 1. Anterior abdominal wall abscess at recent surgical incision site, measuring up to 11 cm. No intraperitoneal abscess or collection. 2. Status post right nephrectomy. 3. Coronary artery and aortic Atherosclerosis (ICD10-I70.0). Electronically Signed   By: Ulyses Jarred M.D.   On: 01/11/2018 02:02   Dg Chest 2 View  Result Date: 01/10/2018 CLINICAL DATA:  Sepsis EXAM: CHEST - 2 VIEW COMPARISON:  12/06/2017 chest radiograph. FINDINGS: Stable sternotomy wires with discontinuity in third sternotomy wire from the top. Status post TAVR. Stable cardiomediastinal silhouette with mild cardiomegaly. No pneumothorax. No pleural effusion. No overt pulmonary edema. No acute consolidative airspace disease. IMPRESSION: Stable mild cardiomegaly without overt pulmonary edema. No active pulmonary disease. Electronically Signed   By: Ilona Sorrel M.D.   On: 01/10/2018 22:48    Pending Labs Unresulted Labs (From admission, onward)    Start     Ordered   01/10/18 2156  Culture, blood (Routine x 2)  BLOOD CULTURE X 2,   STAT    Question:  Patient immune status  Answer:  Immunocompromised   01/10/18 2156   01/10/18 2156  Urine culture  ONCE - STAT,   STAT    Question:  Patient immune status  Answer:  Immunocompromised   01/10/18 2156  Vitals/Pain Today's Vitals   01/11/18 0000 01/11/18 0030 01/11/18 0100 01/11/18 0200  BP: 128/64 (!) 118/59 (!) 103/45 (!) 115/55  Pulse: 85 84 81 80  Resp:  (!) 29 (!) 36 (!) 29  Temp:      TempSrc:      SpO2: 100% 100% 98% 99%  Weight:      Height:      PainSc:        Isolation  Precautions No active isolations  Medications Medications  piperacillin-tazobactam (ZOSYN) IVPB 3.375 g (has no administration in time range)  clindamycin (CLEOCIN) IVPB 900 mg (has no administration in time range)  sodium chloride 0.9 % bolus 1,000 mL (0 mLs Intravenous Stopped 01/11/18 0105)  albuterol (PROVENTIL) (2.5 MG/3ML) 0.083% nebulizer solution 5 mg (5 mg Nebulization Given 01/10/18 2317)  ondansetron (ZOFRAN) injection 4 mg (4 mg Intravenous Given 01/10/18 2308)  vancomycin (VANCOCIN) 1,750 mg in sodium chloride 0.9 % 500 mL IVPB (0 mg Intravenous Stopped 01/11/18 0206)  iohexol (OMNIPAQUE) 300 MG/ML solution 30 mL (30 mLs Oral Contrast Given 01/10/18 2328)    Mobility walks with person assist

## 2018-01-11 NOTE — Progress Notes (Signed)
Patient is a 78 year old female with past medical history significant for hypothyroidism, coronary disease status post CABG, status post TAVR for aortic stenosis, chronic diastolic CHF, COPD, anxiety, recently diagnosed right renal mass status post right nephrectomy on 12/28/2017 who presented to the emergency department with complaints of loss of appetite, fever, generalized weakness along with nausea and vomiting.  CT of the abdomen and pelvis was concerning for anterior wall abscess measuring up to 11 cm at the recent surgical incision site without any intraperitoneal abscess or collection.  Urology consulted.  She underwent I and D by urology today. Patient seen and examined the bedside after the I&D procedure.  She looks comfortable.  Hemodynamically stable.  Still mildly drowsy from anesthesia during the procedure. Currently she is afebrile.  We will continue current broad-spectrum antibiotics.  We will follow-up cultures. Patient seen by Dr. Myna Hidalgo this morning

## 2018-01-11 NOTE — Transfer of Care (Signed)
Immediate Anesthesia Transfer of Care Note  Patient: [name]  Procedure(s) Performed: INCISION AND DRAINAGE ABSCESS (N/A Abdomen)  Patient Location: PACU  Anesthesia Type:General  Level of Consciousness: awake, oriented, drowsy and patient cooperative  Airway & Oxygen Therapy: Patient Spontanous Breathing and Patient connected to face mask oxygen  Post-op Assessment: Report given to RN, Post -op Vital signs reviewed and stable and Patient moving all extremities  Post vital signs: Reviewed and stable  Last Vitals:  Vitals Value Taken Time  BP    Temp    Pulse    Resp    SpO2      Last Pain:  Vitals:   01/11/18 0518  TempSrc: Oral  PainSc: 0-No pain         Complications: No apparent anesthesia complications

## 2018-01-11 NOTE — Telephone Encounter (Signed)
Office visit received from [facility] on 01/11/2018, Appt 02/12/18 @ 3:00PM.NV

## 2018-01-11 NOTE — Anesthesia Procedure Notes (Signed)
Procedure Name: Intubation Date/Time: 01/11/2018 6:13 AM Performed by: Anne Fu, CRNA Pre-anesthesia Checklist: Patient identified, Emergency Drugs available, Suction available, Patient being monitored and Timeout performed Patient Re-evaluated:Patient Re-evaluated prior to induction Oxygen Delivery Method: Circle system utilized Preoxygenation: Pre-oxygenation with 100% oxygen Induction Type: IV induction Ventilation: Mask ventilation without difficulty Laryngoscope Size: Mac and 4 Grade View: Grade I Tube type: Oral Tube size: 7.5 mm Number of attempts: 1 Airway Equipment and Method: Stylet Placement Confirmation: ETT inserted through vocal cords under direct vision,  positive ETCO2 and breath sounds checked- equal and bilateral Secured at: 21 cm Tube secured with: Tape Dental Injury: Teeth and Oropharynx as per pre-operative assessment

## 2018-01-11 NOTE — Progress Notes (Signed)
Pharmacy Antibiotic Note  [name] is a 78 y.o. female admitted on 01/10/2018 with abscess at surgical incision site abdominal wall.  Pharmacy has been consulted for Vancomycin, cefepime dosing.  Plan: Vancomycin 1750mg  iv x1, then 1gm iv q48hr  Goal AUC = 400 - 500 for all indications, except meningitis (goal AUC > 500 and Cmin 15-20 mcg/mL)  Cefepime 2gm iv q24hr  Height: 5\' 7"  (170.2 cm) Weight: 209 lb (94.8 kg) IBW/kg (Calculated) : 61.6  Temp (24hrs), Avg:100.6 F (38.1 C), Min:100.2 F (37.9 C), Max:100.9 F (38.3 C)  Recent Labs  Lab 01/10/18 2203 01/10/18 2216 01/11/18 0230  WBC  --  18.1*  --   CREATININE  --  2.01*  --   LATICACIDVEN 1.17  --  0.78    Estimated Creatinine Clearance: 28.6 mL/min (A) (by C-G formula based on SCr of 2.01 mg/dL (H)).    Allergies  Allergen Reactions  . Chantix [Varenicline Tartrate] Hives  . Dipyridamole Hives  . Varenicline Hives    Antimicrobials this admission: Vancomycin 01/10/2018 >> Cefepime 01/11/2018 >> Flagyl 01/11/2018 >>   Dose adjustments this admission: -  Microbiology results: -  Thank you for allowing pharmacy to be a part of this patient's care.  [name] 01/11/2018 5:07 AM

## 2018-01-11 NOTE — Consult Note (Signed)
Urology Consult   Physician requesting consult: Dr. Myna Hidalgo  Reason for consult: Wound abscess  History of Present Illness: [name] is a 78 y.o. female with multiple medical comorbidites including CAD s/p CABG and aortic stenosis s/p TAVR last spring.  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  Her postoperative course had been uncomplicated up until two days ago (when I had talked to her by phone).  Yesterday, she developed increasing abdominal pain and nausea and developed low grade fever.  She presented to the ED with fever to 100.9 and pain at her camera port incision site.  She was noted to be tachypneic and with mildly low blood pressure.  CT imaging revealed a large 11 x 6 cm superficial wound abscess with a large amount of gas noted.  No intra-abdominal processes were noted or other source for her fever, symptoms.  She denies a history of voiding or storage urinary symptoms, hematuria, UTIs, STDs, urolithiasis, GU malignancy/trauma/surgery.  Past Medical History:  Diagnosis Date  . Abdominal aortic aneurysm ([place])    infrarenal diagnosed with maximum measurement at 3.3 cm  . Coronary artery disease   . Depression   . Diverticulosis   . GERD (gastroesophageal reflux disease)   . History of tobacco abuse    Recurrent cigarette smoking  . Hyperlipidemia   . Hyperplastic colon polyp   . Hypertension   . Hypothyroidism   . Ischemic heart disease    had redo bypass surgery in 2001, at that time she had a free right internal mammary to the LAD, LAD endarterectomy, sequential vein graft to the OM1 and OM2 -- last catheterization was in 2008  . Leg fracture    remote right leg fracture  . Microscopic colitis   . Obesity    with gastric bypass surgery in 2004  . PAD (peripheral artery disease) ([facility])    right iliac occlusion  . PONV (postoperative  nausea and vomiting)   . Right kidney mass   . S/P CABG x 4 1985   LIMA to LAD, Sequential SVG to OM1-OM2, SVG to RCA - Dr Redmond Pulling  . S/P redo CABG x 3 2001   Free RIMA to LAD with endarterectomy, sequential SVG to OM1-OM2 - Dr Servando Snare  . 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 ARTERIAL LINE INSERTION Right 05/09/2017   Procedure: ARTERIAL LINE  INSERTION  RIGHT RADIAL ARTERY;  Surgeon: Sherren Mocha, MD;  Location: [facility];  Service: Open Heart Surgery;  Laterality: Right;  . CARDIAC CATHETERIZATION  02/16/1999   Left heart catheterization with selective coronary  angiography, left ventricular angiography (RAO and LAO views), angiography of left internal mammary artery, and angiography of saphenous vein grafts  . CARDIAC CATHETERIZATION  10/20/2006   Est. EF of 55% -- Totally occuladed native coronary circulation -- Persistent patency of the right mammary graft and the distal LAD artery with peristent patency of the left mammary graft to the proximal LAD artery with persistant patency of the saphenous vein graft to the sequentail branches of the OM        . CARPAL TUNNEL RELEASE    . CHOLECYSTECTOMY    . COLONOSCOPY  07/12/2007   Dr.Brodie- normal cecum and rectum, diverticulosis  in the sigmoid colon  . COLONOSCOPY  1991   per Dr.Brodie office visit note= hyperplastic polyp  . COLONOSCOPY N/A 09/30/2014   Dr.Rourk- noraml appearing rectal mucosa, scattered L sided diverticula, colonic mucosa has a somewhat pale friable appearence diffusely, no ulcers or erosions seen, the distal 10cm of terminal ileum appeared entirely normal. bx=benign colorectal mucosa with lymphocytic colitis  . CORONARY ARTERY BYPASS GRAFT  1985  . CORONARY ARTERY BYPASS GRAFT  03/09/1999   Redo CABG x3 --  with the free right internal mammary  artey to the LAD with an LAD endarterectomy -- Sequential saphenous vein graft to OM1 and OM2                   . CYSTOSCOPY/RETROGRADE/URETEROSCOPY Right 12/21/2017   Procedure: IRCVELFYBO/FBPZWCHENI;  Surgeon: Raynelle Bring, MD;  Location: [facility];  Service: Urology;  Laterality: Right;  . ESOPHAGOGASTRODUODENOSCOPY N/A 09/30/2014   Dr.Rourk-mild erosive reflux esophagitis s/p bariatric surgery  . EYE SURGERY     catracts  . GASTRIC BYPASS  2004  . LAPAROSCOPIC NEPHRECTOMY Right 12/28/2017   Procedure: LAPAROSCOPIC RADICAL NEPHRECTOMY;  Surgeon: Raynelle Bring, MD;  Location: [facility];  Service: Urology;  Laterality: Right;  . RIGHT/LEFT HEART CATH AND CORONARY/GRAFT ANGIOGRAPHY N/A 04/04/2017   Procedure: RIGHT/LEFT HEART CATH AND CORONARY/GRAFT ANGIOGRAPHY;  Surgeon: Martinique, Peter M, MD;  Location: [facility];  Service: Cardiovascular;  Laterality: N/A;  . TEE WITHOUT CARDIOVERSION N/A 05/09/2017   Procedure: TRANSESOPHAGEAL ECHOCARDIOGRAM (TEE);  Surgeon: Sherren Mocha, MD;  Location: [facility];  Service: Open Heart Surgery;  Laterality: N/A;  . TRANSCATHETER AORTIC VALVE REPLACEMENT, TRANSFEMORAL N/A 05/09/2017   Procedure: TRANSCATHETER AORTIC VALVE REPLACEMENT, TRANSFEMORAL using a 27mm Edwards Sapien 3 Aortic Valve;  Surgeon: Sherren Mocha, MD;  Location: [facility];  Service: Open Heart Surgery;  Laterality: N/A;     Current Hospital Medications:  Home meds:  No current facility-administered medications on file prior to encounter.    Current Outpatient Medications on File Prior to Encounter  Medication Sig Dispense Refill  . acetaminophen (TYLENOL) 500 MG tablet Take 1,000 mg by mouth every 6 (six) hours as needed for moderate pain or headache.     . ALPRAZolam (XANAX) 0.25 MG tablet Take 0.25 mg by mouth 3 (three) times daily as needed for anxiety.  0  . amLODipine (NORVASC) 10 MG tablet TAKE 1 TABLET BY MOUTH EVERY DAY 30 tablet 10  . amoxicillin (AMOXIL) 500 MG tablet Take 4  tablets (2,000 mg total) by mouth as directed. Take 1 hour prior to dental visits. 12 tablet 6  . aspirin EC 81 MG tablet Take 81 mg by mouth at bedtime.     [name] BYSTOLIC 20 MG TABS TAKE 1 TABLET BY MOUTH EVERY DAY (Patient taking differently: Take 20 mg by mouth every evening. At bedtime) 30 tablet 5  . calcium elemental as carbonate (BARIATRIC TUMS ULTRA) 400 MG chewable tablet Chew 1,000 mg by mouth 3 (three) times daily as needed for heartburn.    . chlorthalidone (HYGROTON) 25 MG tablet Take 12.5 mg by mouth daily.    [name] FLUoxetine (PROZAC) 40 MG capsule Take 40 mg by mouth every evening.    . furosemide (LASIX) 20 MG tablet Take 20 mg by mouth daily as needed for edema.    . irbesartan (AVAPRO) 300 MG tablet TAKE 1 TABLET BY MOUTH EVERY DAY 90 tablet 1  . levothyroxine (SYNTHROID, LEVOTHROID) 50 MCG tablet Take 50 mcg by mouth daily before  breakfast.    . loperamide (IMODIUM) 2 MG capsule Take 2-4 mg by mouth 4 (four) times daily as needed for diarrhea or loose stools.     . nitroGLYCERIN (NITROSTAT) 0.4 MG SL tablet Place 0.4 mg under the tongue every 5 (five) minutes as needed for chest pain (MAX 3 TABLETS).    . ondansetron (ZOFRAN) 4 MG tablet Take 1 tablet (4 mg total) by mouth every 8 (eight) hours as needed for nausea or vomiting. 20 tablet 0  . pantoprazole (PROTONIX) 40 MG tablet Take 1 tablet (40 mg total) by mouth daily. 30 tablet 11  . PROAIR HFA 108 (90 Base) MCG/ACT inhaler Inhale 2 puffs into the lungs every 4 (four) hours as needed for shortness of breath or wheezing.  2  . simethicone (MYLICON) 845 MG chewable tablet Chew 125 mg by mouth every 6 (six) hours as needed for flatulence.    . simvastatin (ZOCOR) 40 MG tablet Take 1 tablet (40 mg total) by mouth at bedtime. 90 tablet 0  . SYMBICORT 160-4.5 MCG/ACT inhaler Inhale 1 puff into the lungs 2 (two) times daily as needed (for respiratory issues.).   2  . traMADol (ULTRAM) 50 MG tablet Take 1-2 tablets (50-100 mg total) by  mouth every 6 (six) hours as needed for moderate pain or severe pain. 30 tablet 0     Scheduled Meds: . FLUoxetine  40 mg Oral QPM  . levothyroxine  50 mcg Oral Q0600  . mometasone-formoterol  2 puff Inhalation BID  . pantoprazole  40 mg Oral Daily  . simvastatin  40 mg Oral QHS  . sodium chloride flush  3 mL Intravenous Q12H   Continuous Infusions: . sodium chloride    . ceFEPime (MAXIPIME) IV    . [START ON 01/12/2018] ceFEPime (MAXIPIME) IV    . metronidazole     PRN Meds:.acetaminophen **OR** acetaminophen, albuterol, ALPRAZolam, ondansetron **OR** ondansetron (ZOFRAN) IV  Allergies:  Allergies  Allergen Reactions  . Chantix [Varenicline Tartrate] Hives  . Dipyridamole Hives  . Varenicline Hives    Family History  Problem Relation Age of Onset  . Stroke Father   . Diabetes Mother   . Pancreatic cancer Mother     Social History:  reports that she has been smoking cigarettes. She started smoking about 33 years ago. She has been smoking about 0.50 packs per day. She has never used smokeless tobacco. She reports current alcohol use. She reports that she does not use drugs.  ROS: A complete review of systems was performed.  All systems are negative except for pertinent findings as noted.  Physical Exam:  Vital signs in last 24 hours: Temp:  [100.2 F (37.9 C)-100.9 F (38.3 C)] 100.9 F (38.3 C) (01/01 2221) Pulse Rate:  [77-85] 79 (01/02 0430) Resp:  [27-40] 27 (01/02 0430) BP: (101-128)/(45-84) 122/60 (01/02 0430) SpO2:  [96 %-100 %] 100 % (01/02 0430) Weight:  [94.8 kg] 94.8 kg (01/01 2141) Constitutional:  Alert and oriented, No acute distress Cardiovascular: Regular rate and rhythm, No JVD Respiratory: Normal respiratory effort, mildly tachypneic GI: Abdomen is soft, nondistended.  Her midline wound is indurated and mildly erythematous without significant drainage. GU: No CVA tenderness Lymphatic: No lymphadenopathy Neurologic: Grossly intact, no focal  deficits Psychiatric: Normal mood and affect  Laboratory Data:  Recent Labs    01/10/18 2216  WBC 18.1*  HGB 11.1*  HCT 35.1*  PLT 200    Recent Labs    01/10/18 2216  NA 133*  K 4.3  CL 102  GLUCOSE 139*  BUN 29*  CALCIUM 9.4  CREATININE 2.01*     Results for orders placed or performed during the hospital encounter of 01/10/18 (from the past 24 hour(s))  I-Stat CG4 Lactic Acid, ED     Status: None   Collection Time: 01/10/18 10:03 PM  Result Value Ref Range   Lactic Acid, Venous 1.17 0.5 - 1.9 mmol/L  Comprehensive metabolic panel     Status: Abnormal   Collection Time: 01/10/18 10:16 PM  Result Value Ref Range   Sodium 133 (L) 135 - 145 mmol/L   Potassium 4.3 3.5 - 5.1 mmol/L   Chloride 102 98 - 111 mmol/L   CO2 22 22 - 32 mmol/L   Glucose, Bld 139 (H) 70 - 99 mg/dL   BUN 29 (H) 8 - 23 mg/dL   Creatinine, Ser 2.01 (H) 0.44 - 1.00 mg/dL   Calcium 9.4 8.9 - 10.3 mg/dL   Total Protein 6.9 6.5 - 8.1 g/dL   Albumin 3.0 (L) 3.5 - 5.0 g/dL   AST 14 (L) 15 - 41 U/L   ALT 8 0 - 44 U/L   Alkaline Phosphatase 64 38 - 126 U/L   Total Bilirubin 1.2 0.3 - 1.2 mg/dL   GFR calc non Af Amer 24 (L) >60 mL/min   GFR calc Af Amer 27 (L) >60 mL/min   Anion gap 9 5 - 15  CBC with Differential     Status: Abnormal   Collection Time: 01/10/18 10:16 PM  Result Value Ref Range   WBC 18.1 (H) 4.0 - 10.5 K/uL   RBC 3.48 (L) 3.87 - 5.11 MIL/uL   Hemoglobin 11.1 (L) 12.0 - 15.0 g/dL   HCT 35.1 (L) 36.0 - 46.0 %   MCV 100.9 (H) 80.0 - 100.0 fL   MCH 31.9 26.0 - 34.0 pg   MCHC 31.6 30.0 - 36.0 g/dL   RDW 12.8 11.5 - 15.5 %   Platelets 200 150 - 400 K/uL   nRBC 0.0 0.0 - 0.2 %   Neutrophils Relative % 87 %   Neutro Abs 15.7 (H) 1.7 - 7.7 K/uL   Lymphocytes Relative 5 %   Lymphs Abs 0.8 0.7 - 4.0 K/uL   Monocytes Relative 7 %   Monocytes Absolute 1.3 (H) 0.1 - 1.0 K/uL   Eosinophils Relative 0 %   Eosinophils Absolute 0.0 0.0 - 0.5 K/uL   Basophils Relative 0 %   Basophils  Absolute 0.1 0.0 - 0.1 K/uL   Immature Granulocytes 1 %   Abs Immature Granulocytes 0.21 (H) 0.00 - 0.07 K/uL  Protime-INR     Status: None   Collection Time: 01/10/18 10:16 PM  Result Value Ref Range   Prothrombin Time 13.2 11.4 - 15.2 seconds   INR 1.01   Urinalysis, Routine w reflex microscopic     Status: Abnormal   Collection Time: 01/10/18 10:16 PM  Result Value Ref Range   Color, Urine YELLOW YELLOW   APPearance CLEAR CLEAR   Specific Gravity, Urine 1.016 1.005 - 1.030   pH 5.0 5.0 - 8.0   Glucose, UA NEGATIVE NEGATIVE mg/dL   Hgb urine dipstick MODERATE (A) NEGATIVE   Bilirubin Urine NEGATIVE NEGATIVE   Ketones, ur NEGATIVE NEGATIVE mg/dL   Protein, ur NEGATIVE NEGATIVE mg/dL   Nitrite NEGATIVE NEGATIVE   Leukocytes, UA NEGATIVE NEGATIVE   RBC / HPF 6-10 0 - 5 RBC/hpf   WBC, UA 0-5 0 - 5 WBC/hpf  Bacteria, UA NONE SEEN NONE SEEN   Squamous Epithelial / LPF 0-5 0 - 5   Mucus PRESENT    Hyaline Casts, UA PRESENT    Amorphous Crystal PRESENT   I-Stat CG4 Lactic Acid, ED     Status: None   Collection Time: 01/11/18  2:30 AM  Result Value Ref Range   Lactic Acid, Venous 0.78 0.5 - 1.9 mmol/L   No results found for this or any previous visit (from the past 240 hour(s)).  Renal Function: Recent Labs    01/10/18 2216  CREATININE 2.01*   Estimated Creatinine Clearance: 28.6 mL/min (A) (by C-G formula based on SCr of 2.01 mg/dL (H)).  Radiologic Imaging: Ct Abdomen Pelvis Wo Contrast  Result Date: 01/11/2018 CLINICAL DATA:  Nausea and vomiting. Recent fall. History of renal cell carcinoma. EXAM: CT ABDOMEN AND PELVIS WITHOUT CONTRAST TECHNIQUE: Multidetector CT imaging of the abdomen and pelvis was performed following the standard protocol without IV contrast. COMPARISON:  CT abdomen pelvis 05/24/2017 FINDINGS: LOWER CHEST: There is no basilar pleural or apical pericardial effusion. Coronary artery and aortic calcifications. HEPATOBILIARY: The hepatic contours and density  are normal. There is no intra- or extrahepatic biliary dilatation. Status post cholecystectomy. PANCREAS: The pancreatic parenchymal contours are normal and there is no ductal dilatation. There is no peripancreatic fluid collection. SPLEEN: Normal. ADRENALS/URINARY TRACT: --Adrenal glands: Normal. --Right kidney/ureter: Surgically absent. --Left kidney/ureter: No hydronephrosis, nephroureterolithiasis, perinephric stranding or solid renal mass. --Urinary bladder: Normal for degree of distention STOMACH/BOWEL: --Stomach/Duodenum: There is no hiatal hernia or other gastric abnormality. The duodenal course and caliber are normal. --Small bowel: No dilatation or inflammation. --Colon: No focal abnormality. --Appendix: Not visualized. No right lower quadrant inflammation or free fluid. VASCULAR/LYMPHATIC: There is aortic atherosclerosis without hemodynamically significant stenosis. No abdominal or pelvic lymphadenopathy. REPRODUCTIVE: Normal uterus. No adnexal mass. MUSCULOSKELETAL. Multilevel degenerative disc disease and facet arthrosis. No bony spinal canal stenosis. OTHER: There is an abscess containing gas and fluid within the anterior abdominal wall at the incision site. Greatest dimensions measure 6.9 x 11.2 x 2.2 cm. IMPRESSION: 1. Anterior abdominal wall abscess at recent surgical incision site, measuring up to 11 cm. No intraperitoneal abscess or collection. 2. Status post right nephrectomy. 3. Coronary artery and aortic Atherosclerosis (ICD10-I70.0). Electronically Signed   By: Ulyses Jarred M.D.   On: 01/11/2018 02:02   Dg Chest 2 View  Result Date: 01/10/2018 CLINICAL DATA:  Sepsis EXAM: CHEST - 2 VIEW COMPARISON:  12/06/2017 chest radiograph. FINDINGS: Stable sternotomy wires with discontinuity in third sternotomy wire from the top. Status post TAVR. Stable cardiomediastinal silhouette with mild cardiomegaly. No pneumothorax. No pleural effusion. No overt pulmonary edema. No acute consolidative airspace  disease. IMPRESSION: Stable mild cardiomegaly without overt pulmonary edema. No active pulmonary disease. Electronically Signed   By: Ilona Sorrel M.D.   On: 01/10/2018 22:48    I independently reviewed the above imaging studies.  Impression/Recommendation: 1) Large gas-forming abdominal wound abscess: She needs to proceed with incision and drainage of her large abscess.  This is not something that can be adequately performed at the bedside due the large size and intact skin and I have recommended proceeding to the OR to perform this would irrigation of her wound.  She has been started on Zosyn, Vancomycin, and Clindamycin and I will obtain wound cultures in the OR.  Blood cultures are pending. I discussed the potential benefits and risks of the procedure, side effects of the proposed treatment, the likelihood of the  patient achieving the goals of the procedure, and any potential problems that might occur during the procedure or recuperation. She gives informed consent to proceed.   Macyn Shropshire,LES 01/11/2018, 5:07 AM  Pryor Curia. MD   CC: Dr. Myna Hidalgo

## 2018-01-11 NOTE — Op Note (Signed)
Preoperative diagnosis: Superficial wound abscess  Postoperative diagnosis: Superficial wound abscess  Procedure: Incision and drainage of superficial wound abscess (11 x 6 cm)  Surgeon: Pryor Curia MD  Anesthesia: General  Complications: None  EBL: Minimal  Specimens: Aerobic and anaerobic wound cultures  Disposition of specimens: Microbiology lab  Indication: [name] is a 78 year old female with multiple medical comorbidities who was recently noted to have an enlarging right renal tumor on an evaluation for aortic stenosis.  She subsequently underwent a transaortic valve replacement last spring.  Once she was able to safely stop dual antiplatelet therapy, she underwent a right laparoscopic radical nephrectomy on 12/28/2017.  Her initial postoperative course was uneventful until the last 24 hours when she began developing symptoms including low-grade fever, increasing abdominal pain, and nausea.  She presented to the emergency department and a CT scan revealed a very large, gas-forming 11 x 6 cm superficial wound abscess.  I recommended that she proceed to the operating room for incision and drainage.  We discussed the potential risks, complications, and the expected recovery process associated with this procedure.  Informed consent was obtained.  Description of procedure: The patient was taken to the operating room and a general anesthetic was administered.  She had been administered broad-spectrum IV antibiotics beginning in the emergency department and this was continued in the preoperative area.  Her abdomen was prepped and draped in the usual sterile fashion after she was laid supine.  A preoperative timeout was performed.  Her abdomen was then examined.  Her exam was quite unremarkable considering her CT scan findings.  She did have significant induration of her wound but with a very minimal erythema and no drainage noted.  Her prior incision, which remained perfectly intact,  was opened at the skin level with a 15 blade.  This was then carried down through the subcutaneous tissues with electrocautery.  A large, foul-smelling cavity was then entered with a copious amount of purulent drainage.  This was cultured for both aerobic and anaerobic bacteria.  The wound was slightly extended superiorly to allow appropriate packing of her wound.  The wound was then copiously irrigated with saline and packed with a wet-to-dry Kerlix dressing.  She was able to extubated and transferred to the recovery unit in satisfactory condition.    Plan: Proceed with wet-to-dry dressing changes and broad-spectrum antibiotic therapy pending culture results.

## 2018-01-11 NOTE — ED Notes (Signed)
Pt called to use the restroom, writer placed a purwik on pt due to weakness.  RN notified.

## 2018-01-12 ENCOUNTER — Encounter (HOSPITAL_COMMUNITY): Payer: Self-pay | Admitting: Urology

## 2018-01-12 DIAGNOSIS — T8149XA Infection following a procedure, other surgical site, initial encounter: Secondary | ICD-10-CM | POA: Diagnosis not present

## 2018-01-12 DIAGNOSIS — I5032 Chronic diastolic (congestive) heart failure: Secondary | ICD-10-CM | POA: Diagnosis not present

## 2018-01-12 DIAGNOSIS — R6520 Severe sepsis without septic shock: Secondary | ICD-10-CM

## 2018-01-12 DIAGNOSIS — A419 Sepsis, unspecified organism: Secondary | ICD-10-CM

## 2018-01-12 DIAGNOSIS — N179 Acute kidney failure, unspecified: Secondary | ICD-10-CM | POA: Diagnosis not present

## 2018-01-12 DIAGNOSIS — J438 Other emphysema: Secondary | ICD-10-CM | POA: Diagnosis not present

## 2018-01-12 DIAGNOSIS — D539 Nutritional anemia, unspecified: Secondary | ICD-10-CM

## 2018-01-12 LAB — CBC WITH DIFFERENTIAL/PLATELET
Abs Immature Granulocytes: 0.12 10*3/uL — ABNORMAL HIGH (ref 0.00–0.07)
BASOS ABS: 0 10*3/uL (ref 0.0–0.1)
Basophils Relative: 0 %
Eosinophils Absolute: 0 10*3/uL (ref 0.0–0.5)
Eosinophils Relative: 0 %
HCT: 30 % — ABNORMAL LOW (ref 36.0–46.0)
Hemoglobin: 9.5 g/dL — ABNORMAL LOW (ref 12.0–15.0)
Immature Granulocytes: 1 %
Lymphocytes Relative: 6 %
Lymphs Abs: 0.8 10*3/uL (ref 0.7–4.0)
MCH: 32.2 pg (ref 26.0–34.0)
MCHC: 31.7 g/dL (ref 30.0–36.0)
MCV: 101.7 fL — ABNORMAL HIGH (ref 80.0–100.0)
Monocytes Absolute: 0.8 10*3/uL (ref 0.1–1.0)
Monocytes Relative: 6 %
Neutro Abs: 11.1 10*3/uL — ABNORMAL HIGH (ref 1.7–7.7)
Neutrophils Relative %: 87 %
Platelets: 170 10*3/uL (ref 150–400)
RBC: 2.95 MIL/uL — AB (ref 3.87–5.11)
RDW: 12.7 % (ref 11.5–15.5)
WBC: 12.7 10*3/uL — AB (ref 4.0–10.5)
nRBC: 0 % (ref 0.0–0.2)

## 2018-01-12 LAB — BASIC METABOLIC PANEL
Anion gap: 9 (ref 5–15)
BUN: 33 mg/dL — ABNORMAL HIGH (ref 8–23)
CO2: 19 mmol/L — ABNORMAL LOW (ref 22–32)
Calcium: 9.1 mg/dL (ref 8.9–10.3)
Chloride: 104 mmol/L (ref 98–111)
Creatinine, Ser: 1.8 mg/dL — ABNORMAL HIGH (ref 0.44–1.00)
GFR calc Af Amer: 31 mL/min — ABNORMAL LOW (ref >60)
GFR calc non Af Amer: 27 mL/min — ABNORMAL LOW (ref >60)
Glucose, Bld: 154 mg/dL — ABNORMAL HIGH (ref 70–99)
POTASSIUM: 5 mmol/L (ref 3.5–5.1)
Sodium: 132 mmol/L — ABNORMAL LOW (ref 135–145)

## 2018-01-12 LAB — URINE CULTURE: Culture: NO GROWTH

## 2018-01-12 LAB — UREA NITROGEN, URINE: Urea Nitrogen, Ur: 439 mg/dL

## 2018-01-12 MED ORDER — SENNOSIDES-DOCUSATE SODIUM 8.6-50 MG PO TABS
1.0000 | ORAL_TABLET | Freq: Two times a day (BID) | ORAL | Status: DC
  Administered 2018-01-12 – 2018-01-14 (×4): 1 via ORAL
  Filled 2018-01-12 (×5): qty 1

## 2018-01-12 MED ORDER — POLYETHYLENE GLYCOL 3350 17 G PO PACK
17.0000 g | PACK | Freq: Every day | ORAL | Status: DC
  Administered 2018-01-12 – 2018-01-13 (×2): 17 g via ORAL
  Filled 2018-01-12 (×3): qty 1

## 2018-01-12 NOTE — Progress Notes (Signed)
Patient ID: [name], female   DOB: May 27, 1940, 78 y.o.   MRN: [id number]  1 Day Post-Op Subjective: Nausea improved.  Pain controlled.  Still feels weak.  Tolerating dressing changes with pain medication so far. No fever overnight.  Objective: Vital signs in last 24 hours: Temp:  [97.5 F (36.4 C)-98.4 F (36.9 C)] 97.9 F (36.6 C) (01/03 0447) Pulse Rate:  [60-69] 60 (01/03 0447) Resp:  [16-20] 20 (01/03 0447) BP: (94-111)/(50-64) 102/56 (01/03 0447) SpO2:  [95 %-100 %] 97 % (01/03 0447) Weight:  [97.3 kg] 97.3 kg (01/03 0558)  Intake/Output from previous day: 01/02 0701 - 01/03 0700 In: 2054.9 [P.O.:440; I.V.:1092.9; IV Piggyback:522] Out: 650 [Urine:650] Intake/Output this shift: Total I/O In: 120 [P.O.:120] Out: 100 [Urine:100]  Physical Exam:  General: Alert and oriented Abd: Dressing recently changed.  I examined this morning and skin edges and surrounding ski without erythema, induration, etc.    Lab Results: Recent Labs    01/10/18 2216 01/11/18 0524 01/12/18 0606  HGB 11.1* 9.7* 9.5*  HCT 35.1* 30.8* 30.0*   CBC Latest Ref Rng & Units 01/12/2018 01/11/2018 01/10/2018  WBC 4.0 - 10.5 K/uL 12.7(H) 14.9(H) 18.1(H)  Hemoglobin 12.0 - 15.0 g/dL 9.5(L) 9.7(L) 11.1(L)  Hematocrit 36.0 - 46.0 % 30.0(L) 30.8(L) 35.1(L)  Platelets 150 - 400 K/uL 170 174 200     BMET Recent Labs    01/11/18 0524 01/12/18 0606  NA 132* 132*  K 4.4 5.0  CL 101 104  CO2 23 19*  GLUCOSE 120* 154*  BUN 28* 33*  CREATININE 1.74* 1.80*  CALCIUM 8.9 9.1     Studies/Results: WOUND culture gram stain: RARE WBC PRESENT, PREDOMINANTLY PMN, MODERATE GRAM NEGATIVE COCCOBACILLI,FEW GRAM POSITIVE COCCI IN PAIRS   Assessment/Plan: Large, gas-forming superficial wound infection s/p right laparoscopic radical nephrectomy:  Clinically improving s/p I & D yesterday.  Continue broad spectrum IV antibiotics pending final culture results.  Continue wet to dry dressing saline changes TID and  will need to continue dressing changes BID upon discharge. She has follow up with me on 1/14 and should keep that appointment to check wound. She should be ok for discharge with wound care once culture results are finalized.  Will continue to follow during hospitalization.   LOS: 0 days   Dutch Gray 01/12/2018, 9:29 AM

## 2018-01-12 NOTE — Progress Notes (Signed)
PROGRESS NOTE  Mindy PURYEAR KGU:[id number] DOB: [birth year] DOA: 01/10/2018 PCP: [name], MD  HPI/Recap of past 24 hours:  S/p Incision and drainage of superficial wound abscess (11 x 6 cm)  Denies pain, no fever, last bm was on Tuesday  Daughter at bedside Assessment/Plan: Principal Problem:   Abdominal wall abscess at site of surgical wound Active Problems:   CAD (coronary artery disease)   HTN (hypertension)   Hypothyroidism   Anxiety   COPD (chronic obstructive pulmonary disease) (HCC)   AKI (acute kidney injury) ([place])   Chronic diastolic CHF (congestive heart failure) (HCC)   Renal cell carcinoma (HCC)  Abdominal wall abscess /sepsis on presentation with fever 101.3, leukocytosis wbc 18, tachypnea, rr 40, borderline bp 94/56 without home bp meds Large, gas-forming superficial wound infection per urology Incision and drainage of superficial wound abscess (11 x 6 cm) Blood culture no growth, urine culture no growth, wound culture in process, mrsa screening positive Vanc/cefepime/flagyl, continue hold all home bp meds Leukocytosis improving, cr fluctuating Continue wet to dry dressing saline changes TID and will need to continue dressing changes BID upon discharge. 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  Chronic diastolic CHF  - Appears compensated, likely hypovolemic in setting of poor appetite, N/V  - Given a liter of NS in ED  - Hold Lasix, ARB, and beta-blocker until renal fxn and BP improve   CAD, s/p CABGx4 in 1985, then redo CABG x3 in 2001, s/p TAVR in  04/2017 - No anginal complaints  - Continue statin, hold ARB and beta-blocker until BP and renal function improves    Hypertension  bp low normal, all home bp meds held since admission  Obesity, s/p gastric bypass surgery in 2004 Body mass index is 33.6 kg/m.   COPD  - Denies SOB or cough but is wheezing in ED and has mild dyspnea with speech  - Continue ICS/LABA, albuterol neb now and then q4h as needed     Code Status: full  Family Communication: patient and daughter at bedside  Disposition Plan: home in 1-2 days, once culture result finalized   Consultants:  urology  Procedures:  S/p Incision and drainage of superficial wound abscess (11 x 6 cm) by urology Dr Alinda Money on 1/2  Antibiotics:  Vanc/cefepime/flagyl   Objective: BP (!) 102/56 (BP Location: Right Arm)   Pulse 60   Temp 97.9 F (36.6 C) (Oral)   Resp 20   Ht 5\' 7"  (1.702 m)   Wt 97.3 kg   SpO2 97%   BMI 33.60 kg/m   Intake/Output Summary (Last 24 hours) at 01/12/2018 0919 Last data filed at 01/12/2018 0900 Gross per 24 hour  Intake 1674.88 ml  Output 750 ml  Net 924.88 ml   Filed Weights   01/10/18 2141 01/12/18 0558  Weight: 94.8 kg 97.3 kg    Exam: Patient is examined daily including today on 01/12/2018, exams remain the same as of yesterday except that has changed    General:  NAD, hard of hearing   Cardiovascular:  RRR  Respiratory: CTABL  Abdomen: post op changes, dressing in place, positive BS  Musculoskeletal: No Edema  Neuro: alert, oriented   Data Reviewed: Basic Metabolic Panel: Recent Labs  Lab 01/10/18 2216 01/11/18 0524 01/12/18 0606  NA 133* 132* 132*  K 4.3 4.4 5.0  CL 102 101 104  CO2 22 23 19*  GLUCOSE 139* 120* 154*  BUN 29* 28* 33*  CREATININE 2.01* 1.74* 1.80*  CALCIUM 9.4 8.9 9.1   Liver Function Tests: Recent Labs  Lab 01/10/18 2216  AST 14*  ALT 8  ALKPHOS 64  BILITOT 1.2  PROT 6.9  ALBUMIN 3.0*   No results for input(s): LIPASE, AMYLASE  in the last 168 hours. No results for input(s): AMMONIA in the last 168 hours. CBC: Recent Labs  Lab 01/10/18 2216 01/11/18 0524 01/12/18 0606  WBC 18.1* 14.9* 12.7*  NEUTROABS 15.7* 12.4* 11.1*  HGB 11.1* 9.7* 9.5*  HCT 35.1* 30.8* 30.0*  MCV 100.9* 100.7* 101.7*  PLT 200 174 170   Cardiac Enzymes:   No results for input(s): CKTOTAL, CKMB, CKMBINDEX, TROPONINI in the last 168 hours. BNP (last 3 results) Recent Labs    05/05/17 0908  BNP 492.6*    ProBNP (last 3 results) No results for input(s): PROBNP in the last 8760 hours.  CBG: No results for input(s): GLUCAP in the last 168 hours.  Recent Results (from the past 240 hour(s))  Culture, blood (Routine x 2)     Status: None (Preliminary result)   Collection Time: 01/10/18 10:16 PM  Result Value Ref Range Status   Specimen Description   Final    BLOOD LEFT ANTECUBITAL Performed at [facility] [address] [place], [place] [address]    Special Requests   Final    BOTTLES DRAWN AEROBIC AND ANAEROBIC Blood Culture adequate volume Performed at [facility] [address], [place], [place] [address]    Culture   Final    NO GROWTH 2 DAYS Performed at [facility] [address] [place], [place] [address]    Report Status PENDING  Incomplete  Urine culture     Status: None   Collection Time: 01/10/18 10:16 PM  Result Value Ref Range Status   Specimen Description   Final    URINE, CLEAN CATCH Performed at [facility], [place] [address] [place], [place] [address]    Special Requests   Final    Immunocompromised Performed at [facility], [place] [address], [place], [place] [address]    Culture   Final    NO GROWTH Performed at [facility], [place] [address] [place], [place] [address]    Report Status 01/12/2018 FINAL  Final  Culture, blood (Routine x 2)     Status: None (Preliminary result)   Collection Time: 01/11/18   5:24 AM  Result Value Ref Range Status   Specimen Description   Final    BLOOD RIGHT ANTECUBITAL Performed at [facility] [address] [place], [place] [address]    Special Requests   Final    BOTTLES DRAWN AEROBIC AND ANAEROBIC Blood Culture adequate volume Performed at [facility] [address] [place], [place] [address]    Culture   Final    NO GROWTH < 24 HOURS Performed at [facility] [address], [place], [place] [address]    Report Status PENDING  Incomplete  Aerobic/Anaerobic Culture (surgical/deep wound)  Status: None (Preliminary result)   Collection Time: 01/11/18  6:23 AM  Result Value Ref Range Status   Specimen Description   Final    ABSCESS ABDOMEN Performed at [facility] [address] [place], [place] [address]    Special Requests   Final    PATIENT ON FOLLOWING VANC/FLAGYL Performed at [facility] [address] [place], [place] [address]    Gram Stain   Final    RARE WBC PRESENT, PREDOMINANTLY PMN MODERATE GRAM NEGATIVE COCCOBACILLI FEW GRAM POSITIVE COCCI IN PAIRS Performed at [facility], [place] [address], [place], [place] [address]    Culture PENDING  Incomplete   Report Status PENDING  Incomplete  Surgical PCR screen     Status: Abnormal   Collection Time: 01/11/18 12:30 PM  Result Value Ref Range Status   MRSA, PCR POSITIVE (A) NEGATIVE Final    Comment: RESULT CALLED TO, READ BACK BY AND VERIFIED WITH: K.CARTER AT 1602 ON 01/11/18 BY N.THOMPSON    Staphylococcus aureus POSITIVE (A) NEGATIVE Final    Comment: (NOTE) The Xpert SA Assay (FDA approved for NASAL specimens in patients 65 years of age and older), is one component of a comprehensive surveillance program. It is not intended to diagnose infection nor to guide or monitor treatment. Performed at [facility], [place] [address] [place], [place] [address]       Studies: No results found.  Scheduled Meds: . FLUoxetine  40 mg Oral QPM  . levothyroxine  50 mcg Oral Q0600  . mometasone-formoterol  2 puff Inhalation BID  . mupirocin ointment  1 application Nasal BID  . pantoprazole  40 mg Oral Daily  . simvastatin  40 mg Oral QHS  . sodium chloride flush  3 mL Intravenous Q12H    Continuous Infusions: . ceFEPime (MAXIPIME) IV Stopped (01/11/18 2331)  . metronidazole Stopped (01/12/18 0254)  . vancomycin       Time spent: 65mins I have personally reviewed and interpreted on  01/12/2018 daily labs, tele strips, imagings as discussed above under date review session and assessment and plans.  I reviewed all nursing notes, pharmacy notes, consultant notes,  vitals, pertinent old records  I have discussed plan of care as described above with RN , patient and family on 01/12/2018   Florencia Reasons MD, PhD  [facility] Pager [phone]. If 7PM-7AM, please contact night-coverage at [email or web address], password [facility] 01/12/2018, 9:19 AM  LOS: 0 days

## 2018-01-12 NOTE — Care Management Obs Status (Signed)
[facility] NOTIFICATION   Patient Details  Name: [name] MRN: [id number] Date of Birth: 1940-10-22   Medicare Observation Status Notification Given:  Yes    Dessa Phi, RN 01/12/2018, 12:56 PM

## 2018-01-12 NOTE — Anesthesia Postprocedure Evaluation (Signed)
Anesthesia Post Note  Patient: [name]  Procedure([name]) Performed: INCISION AND DRAINAGE ABSCESS (N/A Abdomen)     Patient location during evaluation: PACU Anesthesia Type: General Level of consciousness: awake and alert Pain management: pain level controlled Vital Signs Assessment: post-procedure vital signs reviewed and stable Respiratory status: spontaneous breathing, nonlabored ventilation, respiratory function stable and patient connected to nasal cannula oxygen Cardiovascular status: blood pressure returned to baseline and stable Postop Assessment: no apparent nausea or vomiting Anesthetic complications: no    Last Vitals:  Vitals:   01/11/18 2221 01/12/18 0447  BP: (!) 101/58 (!) 102/56  Pulse: 65 60  Resp: 18 20  Temp: (!) 36.4 C 36.6 C  SpO2: 95% 97%    Last Pain:  Vitals:   01/12/18 0447  TempSrc: Oral  PainSc:                  [name]

## 2018-01-13 DIAGNOSIS — J438 Other emphysema: Secondary | ICD-10-CM | POA: Diagnosis not present

## 2018-01-13 DIAGNOSIS — N179 Acute kidney failure, unspecified: Secondary | ICD-10-CM | POA: Diagnosis not present

## 2018-01-13 DIAGNOSIS — I5032 Chronic diastolic (congestive) heart failure: Secondary | ICD-10-CM | POA: Diagnosis not present

## 2018-01-13 DIAGNOSIS — T8149XA Infection following a procedure, other surgical site, initial encounter: Secondary | ICD-10-CM | POA: Diagnosis not present

## 2018-01-13 LAB — CBC WITH DIFFERENTIAL/PLATELET
Abs Immature Granulocytes: 0.07 10*3/uL (ref 0.00–0.07)
Basophils Absolute: 0 10*3/uL (ref 0.0–0.1)
Basophils Relative: 0 %
Eosinophils Absolute: 0 10*3/uL (ref 0.0–0.5)
Eosinophils Relative: 0 %
HEMATOCRIT: 31.6 % — AB (ref 36.0–46.0)
Hemoglobin: 10.1 g/dL — ABNORMAL LOW (ref 12.0–15.0)
Immature Granulocytes: 1 %
Lymphocytes Relative: 15 %
Lymphs Abs: 1.4 10*3/uL (ref 0.7–4.0)
MCH: 31.8 pg (ref 26.0–34.0)
MCHC: 32 g/dL (ref 30.0–36.0)
MCV: 99.4 fL (ref 80.0–100.0)
Monocytes Absolute: 0.7 10*3/uL (ref 0.1–1.0)
Monocytes Relative: 8 %
Neutro Abs: 7.1 10*3/uL (ref 1.7–7.7)
Neutrophils Relative %: 76 %
Platelets: 229 10*3/uL (ref 150–400)
RBC: 3.18 MIL/uL — ABNORMAL LOW (ref 3.87–5.11)
RDW: 13 % (ref 11.5–15.5)
WBC: 9.4 10*3/uL (ref 4.0–10.5)
nRBC: 0 % (ref 0.0–0.2)

## 2018-01-13 LAB — BASIC METABOLIC PANEL
Anion gap: 9 (ref 5–15)
BUN: 27 mg/dL — AB (ref 8–23)
CO2: 20 mmol/L — ABNORMAL LOW (ref 22–32)
Calcium: 9.4 mg/dL (ref 8.9–10.3)
Chloride: 107 mmol/L (ref 98–111)
Creatinine, Ser: 1.52 mg/dL — ABNORMAL HIGH (ref 0.44–1.00)
GFR calc Af Amer: 38 mL/min — ABNORMAL LOW (ref >60)
GFR calc non Af Amer: 33 mL/min — ABNORMAL LOW (ref >60)
Glucose, Bld: 106 mg/dL — ABNORMAL HIGH (ref 70–99)
Potassium: 4.5 mmol/L (ref 3.5–5.1)
SODIUM: 136 mmol/L (ref 135–145)

## 2018-01-13 LAB — IRON AND TIBC
Iron: 54 ug/dL (ref 28–170)
Saturation Ratios: 23 % (ref 10.4–31.8)
TIBC: 236 ug/dL — ABNORMAL LOW (ref 250–450)
UIBC: 182 ug/dL

## 2018-01-13 LAB — TSH: TSH: 4.968 u[IU]/mL — ABNORMAL HIGH (ref 0.350–4.500)

## 2018-01-13 LAB — FOLATE: Folate: 7.5 ng/mL (ref >5.9)

## 2018-01-13 LAB — VITAMIN B12: Vitamin B-12: 74 pg/mL — ABNORMAL LOW (ref 180–914)

## 2018-01-13 MED ORDER — VITAMIN B-12 1000 MCG PO TABS
1000.0000 ug | ORAL_TABLET | Freq: Every day | ORAL | Status: DC
  Administered 2018-01-14: 1000 ug via ORAL
  Filled 2018-01-13: qty 1

## 2018-01-13 MED ORDER — NEBIVOLOL HCL 10 MG PO TABS
20.0000 mg | ORAL_TABLET | Freq: Every evening | ORAL | Status: DC
  Administered 2018-01-13: 20 mg via ORAL
  Filled 2018-01-13 (×2): qty 2

## 2018-01-13 MED ORDER — FOLIC ACID 1 MG PO TABS
1.0000 mg | ORAL_TABLET | Freq: Every day | ORAL | Status: DC
  Administered 2018-01-14: 1 mg via ORAL
  Filled 2018-01-13: qty 1

## 2018-01-13 NOTE — Progress Notes (Addendum)
PROGRESS NOTE  [name]:[id number] DOB: Oct 17, 1940 DOA: 01/10/2018 PCP: [name], MD  HPI/Recap of past 24 hours:   She received  Dilaudid for dressing changes ,currently she is drowsy but oriented x3 No fever Denies pain, no fever,  No bm since  Tuesday  Daughter at bedside Assessment/Plan: Principal Problem:   Abdominal wall abscess at site of surgical wound Active Problems:   CAD (coronary artery disease)   HTN (hypertension)   Hypothyroidism   Anxiety   COPD (chronic obstructive pulmonary disease) ([place] [address])   AKI (acute kidney injury) ([place])   Chronic diastolic CHF (congestive heart failure) (HCC)   Renal cell carcinoma (HCC)   Severe sepsis (HCC)   Macrocytic anemia  Abdominal wall abscess /sepsis on presentation with fever 101.3, leukocytosis wbc 18, tachypnea, rr 40, borderline bp 94/56 without home bp meds -Large, gas-forming superficial wound infection per urology -Incision and drainage of superficial wound abscess (11 x 6 cm) -Blood culture no growth, urine culture no growth, wound culture in process, mrsa screening positive -Vanc/cefepime/flagyl, home bp meds held since admission, resume bystolic on 1/4 -Leukocytosis resolved, cr improving -Continue wet to dry dressing saline changes TID and will need to continue dressing changes BID upon discharge. 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  -  urology Dr Alinda Money on board  Macrocytic anemia:  mcv 101.7 , hgb 9.5, no overt bleeding Low b12/folic acid, will start b12/folic acid supplement FOBT ordered,  pending collection  Chronic diastolic CHF  - Appears compensated, likely hypovolemic  in setting of poor appetite, N/V  - Given a liter of NS in ED  - home meds Lasix, ARB, and beta-blocker held on admission -restarted on betablocker on 1/4,    CAD, s/p CABGx4 in 1985, then redo CABG x3 in 2001, s/p TAVR in 04/2017 - No anginal complaints  - Continue statin, -restarted on betablocker on 1/4, continue to hold ARB    -asa held on admission due to I/d, likely able to resume at discharge.  Hypertension  all home bp meds held since admission due to borderline bp initially bp improving, restart betablocker on 1/4 Likely able to resume the rest of home bp meds at discharge.  Obesity, s/p gastric bypass surgery in 2004 Body mass index is 33.6 kg/m.   COPD  - Denies SOB or cough but is wheezing in ED and has mild dyspnea with speech  - Continue ICS/LABA, albuterol neb now and then q4h as needed     Code Status: full  Family Communication: patient and daughter at bedside on 1/3  Disposition Plan: home in 1-2 days, once culture result finalized  I reviewed telemetry telemetry has been unremarkable except a few PVCs, patient clinically is improving, we will DC telemetry  Consultants:  urology  Procedures:  S/p Incision and drainage of superficial wound abscess (11 x 6 cm) by urology Dr Alinda Money on 1/2  Antibiotics:  Vanc/cefepime/flagyl   Objective: BP (!) 148/73 (BP Location: Right Arm)   Pulse 67   Temp 97.8 F (36.6 C) (Oral)   Resp 18   Ht 5\' 7"  (1.702 m)   Wt 97.3 kg   SpO2 99%  BMI 33.60 kg/m   Intake/Output Summary (Last 24 hours) at 01/13/2018 0742 Last data filed at 01/13/2018 2947 Gross per 24 hour  Intake 1392.58 ml  Output 1300 ml  Net 92.58 ml   Filed Weights   01/10/18 2141 01/12/18 0558  Weight: 94.8 kg 97.3 kg    Exam: Patient is examined daily including today on 01/13/2018, exams remain the same as of yesterday except that has changed    General:  NAD, hard of hearing   Cardiovascular: RRR  Respiratory: CTABL  Abdomen: post  op changes, dressing in place, positive BS  Musculoskeletal: No Edema  Neuro: alert, oriented   Data Reviewed: Basic Metabolic Panel: Recent Labs  Lab 01/10/18 2216 01/11/18 0524 01/12/18 0606  NA 133* 132* 132*  K 4.3 4.4 5.0  CL 102 101 104  CO2 22 23 19*  GLUCOSE 139* 120* 154*  BUN 29* 28* 33*  CREATININE 2.01* 1.74* 1.80*  CALCIUM 9.4 8.9 9.1   Liver Function Tests: Recent Labs  Lab 01/10/18 2216  AST 14*  ALT 8  ALKPHOS 64  BILITOT 1.2  PROT 6.9  ALBUMIN 3.0*   No results for input(s): LIPASE, AMYLASE in the last 168 hours. No results for input(s): AMMONIA in the last 168 hours. CBC: Recent Labs  Lab 01/10/18 2216 01/11/18 0524 01/12/18 0606 01/13/18 0623  WBC 18.1* 14.9* 12.7* 9.4  NEUTROABS 15.7* 12.4* 11.1* 7.1  HGB 11.1* 9.7* 9.5* 10.1*  HCT 35.1* 30.8* 30.0* 31.6*  MCV 100.9* 100.7* 101.7* 99.4  PLT 200 174 170 229   Cardiac Enzymes:   No results for input(s): CKTOTAL, CKMB, CKMBINDEX, TROPONINI in the last 168 hours. BNP (last 3 results) Recent Labs    05/05/17 0908  BNP 492.6*    ProBNP (last 3 results) No results for input(s): PROBNP in the last 8760 hours.  CBG: No results for input(s): GLUCAP in the last 168 hours.  Recent Results (from the past 240 hour(s))  Culture, blood (Routine x 2)     Status: None (Preliminary result)   Collection Time: 01/10/18 10:16 PM  Result Value Ref Range Status   Specimen Description BLOOD LEFT ANTECUBITAL  Final   Special Requests   Final    BOTTLES DRAWN AEROBIC AND ANAEROBIC Blood Culture adequate volume Performed at [place] [address] [place], [place] [address]    Culture NO GROWTH 3 DAYS  Final   Report Status PENDING  Incomplete  Urine culture     Status: None   Collection Time: 01/10/18 10:16 PM  Result Value Ref Range Status   Specimen Description   Final    URINE, CLEAN CATCH Performed at [facility], [place] [address]  [place], [place] [address]    Special Requests   Final    Immunocompromised Performed at [facility] [place] [address], [place], [place] [address]    Culture   Final    NO GROWTH Performed at [facility], [place] [address] [place], [place] [address]    Report Status 01/12/2018 FINAL  Final  Culture, blood (Routine x 2)     Status: None (Preliminary result)   Collection Time: 01/11/18  5:24 AM  Result Value Ref Range Status   Specimen Description BLOOD RIGHT ANTECUBITAL  Final   Special Requests   Final    BOTTLES DRAWN AEROBIC AND ANAEROBIC Blood Culture adequate volume Performed at [facility] [place] [address], [place],  [address]  Culture NO GROWTH 2 DAYS  Final   Report Status PENDING  Incomplete  Aerobic/Anaerobic Culture (surgical/deep wound)     Status: None (Preliminary result)   Collection Time: 01/11/18  6:23 AM  Result Value Ref Range Status   Specimen Description   Final    ABSCESS ABDOMEN Performed at [facility] [address], [place], [place] [address]    Special Requests   Final    PATIENT ON FOLLOWING VANC/FLAGYL Performed at [facility] [address] [place], [place] [address]    Gram Stain   Final    RARE WBC PRESENT, PREDOMINANTLY PMN MODERATE GRAM NEGATIVE COCCOBACILLI FEW GRAM POSITIVE COCCI IN PAIRS Performed at [facility], [place] [address] [place], [place] [address]    Culture CULTURE REINCUBATED FOR BETTER GROWTH  Final   Report Status PENDING  Incomplete  Surgical PCR screen     Status: Abnormal   Collection Time: 01/11/18 12:30 PM  Result Value Ref Range Status   MRSA, PCR POSITIVE (A) NEGATIVE Final    Comment: RESULT CALLED TO, READ BACK BY AND VERIFIED WITH: K.CARTER AT 1602 ON 01/11/18 BY N.THOMPSON    Staphylococcus aureus POSITIVE (A) NEGATIVE Final    Comment: (NOTE) The Xpert SA Assay (FDA approved for NASAL specimens in patients 87 years  of age and older), is one component of a comprehensive surveillance program. It is not intended to diagnose infection nor to guide or monitor treatment. Performed at [facility], [place] [address] [place], [place] [address]      Studies: No results found.  Scheduled Meds: . FLUoxetine  40 mg Oral QPM  . levothyroxine  50 mcg Oral Q0600  . mometasone-formoterol  2 puff Inhalation BID  . mupirocin ointment  1 application Nasal BID  . pantoprazole  40 mg Oral Daily  . polyethylene glycol  17 g Oral Daily  . senna-docusate  1 tablet Oral BID  . simvastatin  40 mg Oral QHS  . sodium chloride flush  3 mL Intravenous Q12H    Continuous Infusions: . ceFEPime (MAXIPIME) IV Stopped (01/12/18 2222)  . metronidazole 100 mL/hr at 01/13/18 0605  . vancomycin Stopped (01/12/18 1350)     Time spent: 44mins, case discussed with urology on 1/4 I have personally reviewed and interpreted on  01/13/2018 daily labs, tele strips, imagings as discussed above under date review session and assessment and plans.  I reviewed all nursing notes, pharmacy notes, consultant notes,  vitals, pertinent old records  I have discussed plan of care as described above with RN , patient  on 01/13/2018   Florencia Reasons MD, PhD  [facility] Pager [phone]. If 7PM-7AM, please contact night-coverage at [email or web address], password [facility] 01/13/2018, 7:42 AM  LOS: 0 days

## 2018-01-13 NOTE — Progress Notes (Signed)
2 Days Post-Op   Subjective/Chief Complaint:  1 - Subcutaneous Wound Abscess - s/p operative I+D of large abscess above fascia 01/11/18. [name] 1/2 GPC/GNC / pending, BCX 1/2 - NGTD, UCX 1/2 - Neg, MRSA PCR - Positive. Placed on Vanc + Cefepime pending further CX data. Wet to Dry dressing changes by nursing.  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 Cr <2 this admission.  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   Today " [name] " is stable. No fevers. Remains on broad IV ABX pending further CX data.    Objective: Vital signs in last 24 hours: Temp:  [97.7 F (36.5 C)-97.9 F (36.6 C)] 97.8 F (36.6 C) (01/04 0538) Pulse Rate:  [67-70] 67 (01/04 0538) Resp:  [16-18] 18 (01/04 0538) BP: (103-148)/(57-73) 148/73 (01/04 0538) SpO2:  [97 %-100 %] 99 % (01/04 0538) Last BM Date: 01/10/18  Intake/Output from previous day: 01/03 0701 - 01/04 0700 In: 1380.1 [P.O.:480; IV Piggyback:900.1] Out: 1300 [Urine:1300] Intake/Output this shift: Total I/O In: 840.1 [P.O.:240; IV Piggyback:600.1] Out: 1200 [Urine:1200]  General appearance: alert, cooperative and appears stated age Head: Normocephalic, without obvious abnormality, atraumatic Eyes: negative Nose: Nares normal. Septum midline. Mucosa normal. No drainage or sinus tenderness. Throat: lips, mucosa, and tongue normal; teeth and gums normal Neck: supple, symmetrical, trachea midline Back: symmetric, no curvature. ROM normal. No CVA tenderness. Resp: non-labored on room air Cardio: Nl rate Extremities: extremities normal, atraumatic, no cyanosis or edema Neurologic: Grossly normal Incision/Wound: wet to dry dressing in place that is non-foul, changed few hours ago.   Lab Results:  Recent Labs    01/11/18 0524 01/12/18 0606  WBC 14.9* 12.7*  HGB 9.7* 9.5*  HCT 30.8* 30.0*  PLT 174 170   BMET Recent Labs    01/11/18 0524  01/12/18 0606  NA 132* 132*  K 4.4 5.0  CL 101 104  CO2 23 19*  GLUCOSE 120* 154*  BUN 28* 33*  CREATININE 1.74* 1.80*  CALCIUM 8.9 9.1   PT/INR Recent Labs    01/10/18 2216  LABPROT 13.2  INR 1.01   ABG No results for input(s): PHART, HCO3 in the last 72 hours.  Invalid input(s): PCO2, PO2  Studies/Results: No results found.  Anti-infectives: Anti-infectives (From admission, onward)   Start     Dose/Rate Route Frequency Ordered Stop   01/12/18 2200  vancomycin (VANCOCIN) IVPB 1000 mg/200 mL premix  Status:  Discontinued     1,000 mg 200 mL/hr over 60 Minutes Intravenous Every 48 hours 01/11/18 0513 01/11/18 0950   01/12/18 1200  vancomycin (VANCOCIN) IVPB 1000 mg/200 mL premix     1,000 mg 200 mL/hr over 60 Minutes Intravenous Every 36 hours 01/11/18 0950     01/12/18 0600  ceFEPIme (MAXIPIME) 2 g in sodium chloride 0.9 % 100 mL IVPB  Status:  Discontinued     2 g 200 mL/hr over 30 Minutes Intravenous Every 24 hours 01/11/18 0506 01/11/18 0950   01/11/18 1200  ceFEPIme (MAXIPIME) 1 g in sodium chloride 0.9 % 100 mL IVPB     1 g 200 mL/hr over 30 Minutes Intravenous Every 12 hours 01/11/18 0950     01/11/18 0600  metroNIDAZOLE (FLAGYL) IVPB 500 mg     500 mg 100 mL/hr over 60 Minutes Intravenous Every 8 hours 01/11/18 0453     01/11/18 0515  ceFEPIme (MAXIPIME) 2 g in  sodium chloride 0.9 % 100 mL IVPB     2 g 200 mL/hr over 30 Minutes Intravenous  Once 01/11/18 0503 01/11/18 0600   01/11/18 0245  piperacillin-tazobactam (ZOSYN) IVPB 3.375 g  Status:  Discontinued     3.375 g 100 mL/hr over 30 Minutes Intravenous  Once 01/11/18 0233 01/11/18 0503   01/11/18 0245  clindamycin (CLEOCIN) IVPB 900 mg  Status:  Discontinued     900 mg 100 mL/hr over 30 Minutes Intravenous  Once 01/11/18 0233 01/11/18 0453   01/10/18 2315  vancomycin (VANCOCIN) 1,750 mg in sodium chloride 0.9 % 500 mL IVPB     1,750 mg 250 mL/hr over 120 Minutes Intravenous  Once 01/10/18 2302 01/11/18  0206   01/10/18 2300  vancomycin (VANCOCIN) IVPB 1000 mg/200 mL premix  Status:  Discontinued     1,000 mg 200 mL/hr over 60 Minutes Intravenous  Once 01/10/18 2257 01/10/18 2301      Assessment/Plan:  1 - Subcutaneous Wound Abscess - continue wet to dry dressing changes, this will heal by secondary intent and take weeks, but this is safest way to heal in setting of infection. Will likely require home health dressing changes at discharge daily.   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   3- High Grade Kidney Cancer - no further cancer directed care indicated   [name] 01/13/2018

## 2018-01-13 NOTE — Evaluation (Signed)
Physical Therapy Evaluation Patient Details Name: [name] MRN: [id number] DOB: 28-May-1940 Today's Date: 01/13/2018   History of Present Illness  Pt s/p I&D of abdominal wound abscess.  Pt with hx of nephrectomy s/p kidney CA, CABG, PAD, CAD, COPD AAA, ischemic heart disease, and depression  Clinical Impression  Pt admitted as above and presenting with functional mobility limitations 2* generalized weakness, limited endurance and ambulatory balance deficits.  Pt should progress to dc home with assist of family.    Follow Up Recommendations Home health PT    Equipment Recommendations  None recommended by PT    Recommendations for Other Services OT consult     Precautions / Restrictions Precautions Precautions: Fall Required Braces or Orthoses: Knee Immobilizer - Right Knee Immobilizer - Right: On when out of bed or walking Restrictions Weight Bearing Restrictions: No Other Position/Activity Restrictions: WBAT      Mobility  Bed Mobility Overal bed mobility: Needs Assistance Bed Mobility: Supine to Sit     Supine to sit: Min guard     General bed mobility comments: use of rail  Transfers Overall transfer level: Needs assistance Equipment used: Rolling walker (2 wheeled) Transfers: Sit to/from Stand Sit to Stand: Min guard         General transfer comment: steady assist  Ambulation/Gait Ambulation/Gait assistance: Min assist Gait Distance (Feet): 75 Feet(twice) Assistive device: Rolling walker (2 wheeled) Gait Pattern/deviations: Step-to pattern;Step-through pattern;Decreased step length - right;Decreased step length - left;Shuffle;Trunk flexed Gait velocity: decr   General Gait Details: cues for posture and position from RW; physical assist to steady balance  Stairs            Wheelchair Mobility    Modified Rankin (Stroke Patients Only)       Balance Overall balance assessment: Needs assistance Sitting-balance support: No upper extremity  supported;Feet supported Sitting balance-Leahy Scale: Good     Standing balance support: Bilateral upper extremity supported Standing balance-Leahy Scale: Fair                               Pertinent Vitals/Pain Pain Assessment: No/denies pain    Home Living Family/patient expects to be discharged to:: Private residence Living Arrangements: Spouse/significant other;Children Available Help at Discharge: Family Type of Home: House Home Access: Stairs to enter Entrance Stairs-Rails: Left Entrance Stairs-Number of Steps: 3 Home Layout: One level Home Equipment: Environmental consultant - 2 wheels;Bedside commode      Prior Function Level of Independence: Independent               Hand Dominance        Extremity/Trunk Assessment   Upper Extremity Assessment Upper Extremity Assessment: Generalized weakness    Lower Extremity Assessment Lower Extremity Assessment: Generalized weakness       Communication   Communication: No difficulties  Cognition Arousal/Alertness: Awake/alert Behavior During Therapy: WFL for tasks assessed/performed Overall Cognitive Status: Within Functional Limits for tasks assessed                                        General Comments      Exercises     Assessment/Plan    PT Assessment Patient needs continued PT services  PT Problem List Decreased strength;Decreased activity tolerance;Decreased balance;Decreased mobility;Decreased knowledge of use of DME       PT Treatment Interventions DME instruction;Gait training;Stair  training;Functional mobility training;Therapeutic activities;Therapeutic exercise;Patient/family education    PT Goals (Current goals can be found in the Care Plan section)  Acute Rehab PT Goals Patient Stated Goal: Regain IND PT Goal Formulation: With patient Time For Goal Achievement: 01/27/18 Potential to Achieve Goals: Good    Frequency Min 3X/week   Barriers to discharge         Co-evaluation               AM-PAC PT "6 Clicks" Mobility  Outcome Measure Help needed turning from your back to your side while in a flat bed without using bedrails?: None Help needed moving from lying on your back to sitting on the side of a flat bed without using bedrails?: A Little Help needed moving to and from a bed to a chair (including a wheelchair)?: A Little Help needed standing up from a chair using your arms (e.g., wheelchair or bedside chair)?: A Little Help needed to walk in hospital room?: A Little Help needed climbing 3-5 steps with a railing? : A Lot 6 Click Score: 18    End of Session Equipment Utilized During Treatment: Gait belt Activity Tolerance: Patient tolerated treatment well;Patient limited by fatigue Patient left: in chair;with call bell/phone within reach;with chair alarm set Nurse Communication: Mobility status PT Visit Diagnosis: Difficulty in walking, not elsewhere classified (R26.2);Muscle weakness (generalized) (M62.81)    Time: 1245-8099 PT Time Calculation (min) (ACUTE ONLY): 22 min   Charges:   PT Evaluation $PT Eval Low Complexity: 1 Low          [place] Pager [phone] Office [phone]   [name] 01/13/2018, 1:09 PM

## 2018-01-13 NOTE — Progress Notes (Signed)
Patient had a large Type 6 BM today. It was mixed with urine, unable to obtain FOBT. Will continue to try and collect sample per orders.

## 2018-01-14 DIAGNOSIS — T8149XA Infection following a procedure, other surgical site, initial encounter: Secondary | ICD-10-CM | POA: Diagnosis not present

## 2018-01-14 LAB — BASIC METABOLIC PANEL
Anion gap: 8 (ref 5–15)
BUN: 21 mg/dL (ref 8–23)
CO2: 20 mmol/L — ABNORMAL LOW (ref 22–32)
Calcium: 9.4 mg/dL (ref 8.9–10.3)
Chloride: 110 mmol/L (ref 98–111)
Creatinine, Ser: 1.3 mg/dL — ABNORMAL HIGH (ref 0.44–1.00)
GFR calc Af Amer: 46 mL/min — ABNORMAL LOW (ref >60)
GFR calc non Af Amer: 40 mL/min — ABNORMAL LOW (ref >60)
Glucose, Bld: 97 mg/dL (ref 70–99)
Potassium: 4.2 mmol/L (ref 3.5–5.1)
Sodium: 138 mmol/L (ref 135–145)

## 2018-01-14 MED ORDER — FOLIC ACID 1 MG PO TABS: 1.0000 mg | ORAL_TABLET | Freq: Every day | ORAL | 1 refills | Status: DC

## 2018-01-14 MED ORDER — DOXYCYCLINE HYCLATE 100 MG PO TABS: 100.0000 mg | ORAL_TABLET | Freq: Two times a day (BID) | ORAL | Status: DC

## 2018-01-14 MED ORDER — CYANOCOBALAMIN 1000 MCG PO TABS: 1000.0000 ug | ORAL_TABLET | Freq: Every day | ORAL | 1 refills | Status: DC

## 2018-01-14 MED ORDER — DOXYCYCLINE HYCLATE 100 MG PO TABS: 100.0000 mg | ORAL_TABLET | Freq: Two times a day (BID) | ORAL | 0 refills | Status: AC

## 2018-01-14 MED ORDER — AMOXICILLIN-POT CLAVULANATE 875-125 MG PO TABS: 1.0000 | ORAL_TABLET | Freq: Two times a day (BID) | ORAL | 0 refills | Status: AC

## 2018-01-14 NOTE — Progress Notes (Signed)
Pt to be discharged to home this afternoon. Home Health arranged by Case Manager for wound care. Family Member also shown how to change abd wound wet to dry. NS kerlix packed into wound and wound recovered with abdominal pads. Family member and Pt verbalized understanding of dressing change care instructions. Pt and family also given discharge teaching including all medications and schedules reviewed Hard Copy Prescriptions and discharge packet with Pt at time of discharge.

## 2018-01-14 NOTE — Discharge Instructions (Signed)
Incision and Drainage, Care After  Refer to this sheet in the next few weeks. These instructions provide you with information about caring for yourself after your procedure. Your health care provider may also give you more specific instructions. Your treatment has been planned according to current medical practices, but problems sometimes occur. Call your health care provider if you have any problems or questions after your procedure.  What can I expect after the procedure?  After the procedure, it is common to have:  · Pain or discomfort around your incision site.  · Drainage from your incision.  Follow these instructions at home:  · Take over-the-counter and prescription medicines only as told by your health care provider.  · If you were prescribed an antibiotic medicine, take it as told by your health care provider. Do not stop taking the antibiotic even if you start to feel better.  · Follow instructions from your health care provider about:  ? How to take care of your incision.  ? When and how you should change your packing and bandage (dressing). Wash your hands with soap and water before you change your dressing. If soap and water are not available, use hand sanitizer.  ? When you should remove your dressing.  · Do not take baths, swim, or use a hot tub until your health care provider approves.  · Keep all follow-up visits as told by your health care provider. This is important.  · Check your incision area every day for signs of infection. Check for:  ? More redness, swelling, or pain.  ? More fluid or blood.  ? Warmth.  ? Pus or a bad smell.  Contact a health care provider if:  · Your cyst or abscess returns.  · You have a fever.  · You have more redness, swelling, or pain around your incision.  · You have more fluid or blood coming from your incision.  · Your incision feels warm to the touch.  · You have pus or a bad smell coming from your incision.  Get help right away if:  · You have severe pain or  bleeding.  · You cannot eat or drink without vomiting.  · You have decreased urine output.  · You become short of breath.  · You have chest pain.  · You cough up blood.  · The area where the incision and drainage occurred becomes numb or it tingles.  This information is not intended to replace advice given to you by your health care provider. Make sure you discuss any questions you have with your health care provider.  Document Released: 03/21/2011 Document Revised: 05/29/2015 Document Reviewed: 10/17/2014  Elsevier Interactive Patient Education © 2019 [employer]

## 2018-01-14 NOTE — Care Management Note (Signed)
Case Management Note  Patient Details  Name: [name] MRN: [id number] Date of Birth: Dec 23, 1940  Subjective/Objective:  Abdominal wall abscess, CKD III, renal cell carcinoma                  Action/Plan:  Spoke to pt at bedside. Offered choice for HH/CMS list provided/placed on chart. Pt requesting [facility] for HH. Contacted [facility] with new referral. Pt has neb machine, RW and bedside commode at home. Dtr will assist at home.    Expected Discharge Date:  01/14/18               Expected Discharge Plan:  [place]  In-House Referral:  NA  Discharge planning Services  CM Consult  Post Acute Care Choice:  Home Health Choice offered to:  Patient  DME Arranged:  N/A DME Agency:  NA  HH Arranged:  PT, RN [place] Agency:  [place]  Status of Service:  Completed, signed off  If discussed at [place] of Stay Meetings, dates discussed:    Additional Comments:  Erenest Rasher, RN 01/14/2018, 11:44 AM

## 2018-01-14 NOTE — Discharge Summary (Signed)
Physician Discharge Summary  [name] QMG:[id number] DOB: [birth year] DOA: 01/10/2018  PCP: [name], MD  Admit date: 01/10/2018 Discharge date: 01/14/2018  Admitted From: Home Disposition:  Home  Recommendations for Outpatient Follow-up:  1. Follow up with PCP in 1-2 weeks 2. Please obtain BMP/CBC in one week 3. Please follow up abscess culture results  Home Health: Yes, home health PT Equipment/Devices: None  Discharge Condition: Stable CODE STATUS: Full Diet recommendation: Heart Healthy / Carb Modified  Brief/Interim Summary:  #) Sepsis due to abdominal wall abscess: Patient was admitted with sepsis due to large abdominal wall abscess at site of prior nephrectomy.  CT scan showed abscess was superficial.  Patient was placed on broad-spectrum antibiotics with vancomycin, cefepime, metronidazole and home blood pressure medications were held.  Patient had incision and drainage of 11 x 6 cm abscess on 01/11/2018.  This did not grow anything.  Blood cultures were negative.  Patient was discharged home on oral doxycycline and Augmentin for empiric treatment due to MRSA swab positive.  Patient will continue wet-to-dry saline changes twice daily.  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  Patient's creatinine improved to 1.3 on discharge.  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  #) Macrocytic anemia: Patient was noted to have macrocytic anemia.  T26 and folic acid were started.  She was discharged home with a prescription for these.  #) Chronic diastolic CHF: Patient's home beta-blocker, furosemide was held due to hypotension and sepsis.  She may restart these medications on discharge.  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  Patient's beta-blocker was initially held but then restarted.  Patient's aspirin was resumed.  Patient may restart her ARB.  #) Hypertension: Patient's home blood pressure medications were held due to borderline blood pressure.  This may be restarted on discharge.  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: Per above patient was noted to have microcytic anemia and Z12 and folic acid supplementation were started  #) COPD: Patient was continued on ICS/LABA as well as PRN albuterol.[name]   Discharge Diagnoses:  Principal Problem:   Abdominal wall abscess at site of surgical wound Active Problems:   CAD (coronary artery disease)   HTN (hypertension)   Hypothyroidism   Anxiety   COPD (chronic obstructive pulmonary disease) (HCC)   AKI (acute kidney injury) (HCC)   Chronic diastolic CHF (congestive heart failure) (HCC)   Renal cell carcinoma (HCC)   Severe sepsis (HCC)   Macrocytic anemia    Discharge Instructions  Discharge Instructions    Call MD for:  difficulty breathing, headache or visual disturbances   Complete by:  As directed    Call MD for:  hives   Complete by:  As directed    Call MD for:  persistant dizziness or light-headedness   Complete by:  As directed    Call MD for:  persistant nausea and vomiting   Complete by:  As directed    Call MD for:  redness, tenderness, or signs of infection (pain, swelling, redness, odor or green/yellow discharge around incision site)   Complete by:  As directed    Call MD for:  severe uncontrolled pain   Complete by:  As directed    Call MD  for:  temperature >100.4   Complete by:  As directed    Diet - low sodium heart healthy   Complete by:  As directed    Discharge instructions   Complete by:  As directed    Please follow-up with your primary care doctor in 1 week.  Please take your antibiotics as prescribed.   Increase activity slowly   Complete by:  As directed      Allergies as of 01/14/2018       Reactions   Chantix [varenicline Tartrate] Hives   Dipyridamole Hives   Varenicline Hives      Medication List    TAKE these medications   acetaminophen 500 MG tablet Commonly known as:  TYLENOL Take 1,000 mg by mouth every 6 (six) hours as needed for moderate pain or headache.   ALPRAZolam 0.25 MG tablet Commonly known as:  XANAX Take 0.25 mg by mouth 3 (three) times daily as needed for anxiety.   amLODipine 10 MG tablet Commonly known as:  NORVASC TAKE 1 TABLET BY MOUTH EVERY DAY   amoxicillin 500 MG tablet Commonly known as:  AMOXIL Take 4 tablets (2,000 mg total) by mouth as directed. Take 1 hour prior to dental visits.   amoxicillin-clavulanate 875-125 MG tablet Commonly known as:  AUGMENTIN Take 1 tablet by mouth 2 (two) times daily for 10 days.   aspirin EC 81 MG tablet Take 81 mg by mouth at bedtime.   BYSTOLIC 20 MG Tabs Generic drug:  Nebivolol HCl TAKE 1 TABLET BY MOUTH EVERY DAY What changed:    how much to take  when to take this  additional instructions   calcium elemental as carbonate 400 MG chewable tablet Commonly known as:  BARIATRIC TUMS ULTRA Chew 1,000 mg by mouth 3 (three) times daily as needed for heartburn.   chlorthalidone 25 MG tablet Commonly known as:  HYGROTON Take 12.5 mg by mouth daily.   cyanocobalamin 1000 MCG tablet Take 1 tablet (1,000 mcg total) by mouth daily.   doxycycline 100 MG tablet Commonly known as:  VIBRA-TABS Take 1 tablet (100 mg total) by mouth every 12 (twelve) hours for 10 days.   FLUoxetine 40 MG capsule Commonly known as:  PROZAC Take 40 mg by mouth every evening.   folic acid 1 MG tablet Commonly known as:  FOLVITE Take 1 tablet (1 mg total) by mouth daily.   furosemide 20 MG tablet Commonly known as:  LASIX Take 20 mg by mouth daily as needed for edema.   irbesartan 300 MG tablet Commonly known as:  AVAPRO TAKE 1 TABLET BY MOUTH EVERY DAY   levothyroxine 50 MCG tablet Commonly known as:   SYNTHROID, LEVOTHROID Take 50 mcg by mouth daily before breakfast.   loperamide 2 MG capsule Commonly known as:  IMODIUM Take 2-4 mg by mouth 4 (four) times daily as needed for diarrhea or loose stools.   nitroGLYCERIN 0.4 MG SL tablet Commonly known as:  NITROSTAT Place 0.4 mg under the tongue every 5 (five) minutes as needed for chest pain (MAX 3 TABLETS).   ondansetron 4 MG tablet Commonly known as:  ZOFRAN Take 1 tablet (4 mg total) by mouth every 8 (eight) hours as needed for nausea or vomiting.   pantoprazole 40 MG tablet Commonly known as:  PROTONIX Take 1 tablet (40 mg total) by mouth daily.   PROAIR HFA 108 (90 Base) MCG/ACT inhaler Generic drug:  albuterol Inhale 2 puffs into the lungs every 4 (four) hours as  needed for shortness of breath or wheezing.   simethicone 125 MG chewable tablet Commonly known as:  MYLICON Chew 250 mg by mouth every 6 (six) hours as needed for flatulence.   simvastatin 40 MG tablet Commonly known as:  ZOCOR Take 1 tablet (40 mg total) by mouth at bedtime.   SYMBICORT 160-4.5 MCG/ACT inhaler Generic drug:  budesonide-formoterol Inhale 1 puff into the lungs 2 (two) times daily as needed (for respiratory issues.).   traMADol 50 MG tablet Commonly known as:  ULTRAM Take 1-2 tablets (50-100 mg total) by mouth every 6 (six) hours as needed for moderate pain or severe pain.      Follow-up Information    Raynelle Bring, MD Follow up.   Specialty:  Urology Why:  01/23/18 at 1:30 PM Contact information: [place] [address] [phone]        [name], MD Follow up in 1 week(s).   Specialty:  Family Medicine Why:  hospital discharge follow up, repeat cbc/bmp at follow up. Contact information: [place] [address] [phone]        Martinique, Peter M, MD .   Specialty:  Cardiology Contact information: [address] [place] [address] [phone]           Allergies  Allergen Reactions  . Chantix [Varenicline Tartrate] Hives  . Dipyridamole Hives  . Varenicline Hives    Consultations:  Urology   Procedures/Studies: Ct Abdomen Pelvis Wo Contrast  Result Date: 01/11/2018 CLINICAL DATA:  Nausea and vomiting. Recent fall. History of renal cell carcinoma. EXAM: CT ABDOMEN AND PELVIS WITHOUT CONTRAST TECHNIQUE: Multidetector CT imaging of the abdomen and pelvis was performed following the standard protocol without IV contrast. COMPARISON:  CT abdomen pelvis 05/24/2017 FINDINGS: LOWER CHEST: There is no basilar pleural or apical pericardial effusion. Coronary artery and aortic calcifications. HEPATOBILIARY: The hepatic contours and density are normal. There is no intra- or extrahepatic biliary dilatation. Status post cholecystectomy. PANCREAS: The pancreatic parenchymal contours are normal and there is no ductal dilatation. There is no peripancreatic fluid collection. SPLEEN: Normal. ADRENALS/URINARY TRACT: --Adrenal glands: Normal. --Right kidney/ureter: Surgically absent. --Left kidney/ureter: No hydronephrosis, nephroureterolithiasis, perinephric stranding or solid renal mass. --Urinary bladder: Normal for degree of distention STOMACH/BOWEL: --Stomach/Duodenum: There is no hiatal hernia or other gastric abnormality. The duodenal course and caliber are normal. --Small bowel: No dilatation or inflammation. --Colon: No focal abnormality. --Appendix: Not visualized. No right lower quadrant inflammation or free fluid. VASCULAR/LYMPHATIC: There is aortic atherosclerosis without hemodynamically significant stenosis. No abdominal or pelvic lymphadenopathy. REPRODUCTIVE: Normal uterus. No adnexal mass. MUSCULOSKELETAL. Multilevel degenerative disc disease and facet arthrosis. No bony spinal canal stenosis. OTHER: There is an abscess containing gas and fluid within the anterior abdominal wall at the incision site. Greatest dimensions measure 6.9 x 11.2 x 2.2 cm.  IMPRESSION: 1. Anterior abdominal wall abscess at recent surgical incision site, measuring up to 11 cm. No intraperitoneal abscess or collection. 2. Status post right nephrectomy. 3. Coronary artery and aortic Atherosclerosis (ICD10-I70.0). Electronically Signed   By: Ulyses Jarred M.D.   On: 01/11/2018 02:02   Dg Chest 2 View  Result Date: 01/10/2018 CLINICAL DATA:  Sepsis EXAM: CHEST - 2 VIEW COMPARISON:  12/06/2017 chest radiograph. FINDINGS: Stable sternotomy wires with discontinuity in third sternotomy wire from the top. Status post TAVR. Stable cardiomediastinal silhouette with mild cardiomegaly. No pneumothorax. No pleural effusion. No overt pulmonary edema. No acute consolidative airspace disease. IMPRESSION: Stable mild  cardiomegaly without overt pulmonary edema. No active pulmonary disease. Electronically Signed   By: Ilona Sorrel M.D.   On: 01/10/2018 22:48   Dg C-arm 1-60 Min-no Report  Result Date: 12/21/2017 Fluoroscopy was utilized by the requesting physician.  No radiographic interpretation.   01/11/2017:Description of procedure: The patient was taken to the operating room and a general anesthetic was administered.  She had been administered broad-spectrum IV antibiotics beginning in the emergency department and this was continued in the preoperative area.  Her abdomen was prepped and draped in the usual sterile fashion after she was laid supine.  A preoperative timeout was performed.  Her abdomen was then examined.  Her exam was quite unremarkable considering her CT scan findings.  She did have significant induration of her wound but with a very minimal erythema and no drainage noted.  Her prior incision, which remained perfectly intact, was opened at the skin level with a 15 blade.  This was then carried down through the subcutaneous tissues with electrocautery.  A large, foul-smelling cavity was then entered with a copious amount of purulent drainage.  This was cultured for both aerobic  and anaerobic bacteria.  The wound was slightly extended superiorly to allow appropriate packing of her wound.  The wound was then copiously irrigated with saline and packed with a wet-to-dry Kerlix dressing.  She was able to extubated and transferred to the recovery unit in satisfactory condition.    Plan: Proceed with wet-to-dry dressing changes and broad-spectrum antibiotic therapy pending culture results.   Subjective:   Discharge Exam: Vitals:   01/14/18 0417 01/14/18 0943  BP: (!) 149/88   Pulse: 67   Resp: 18   Temp: 97.7 F (36.5 C)   SpO2: 97% 96%   Vitals:   01/13/18 2026 01/14/18 0417 01/14/18 0626 01/14/18 0943  BP: 114/68 (!) 149/88    Pulse: 71 67    Resp: 18 18    Temp: 97.8 F (36.6 C) 97.7 F (36.5 C)    TempSrc: Oral Oral    SpO2: 96% 97%  96%  Weight:   98.2 kg   Height:        General: Pt is alert, awake, not in acute distress Cardiovascular: RRR, S1/S2 +, no rubs, no gallops Respiratory: CTA bilaterally, no wheezing, no rhonchi Abdominal: Soft, NT, ND, bowel sounds +, incision site is currently dressed, no surrounding erythema or drainage Extremities: 1+ lower extremity edema    The results of significant diagnostics from this hospitalization (including imaging, microbiology, ancillary and laboratory) are listed below for reference.     Microbiology: Recent Results (from the past 240 hour(s))  Culture, blood (Routine x 2)     Status: None (Preliminary result)   Collection Time: 01/10/18 10:16 PM  Result Value Ref Range Status   Specimen Description BLOOD LEFT ANTECUBITAL  Final   Special Requests   Final    BOTTLES DRAWN AEROBIC AND ANAEROBIC Blood Culture adequate volume Performed at [facility] [address] [place], [place] [address]    Culture NO GROWTH 4 DAYS  Final   Report Status PENDING  Incomplete  Urine culture     Status: None   Collection Time: 01/10/18 10:16 PM  Result Value Ref Range Status   Specimen  Description   Final    URINE, CLEAN CATCH Performed at [facility], [place] [address], [place], [place] [address]    Special Requests   Final    Immunocompromised Performed at [facility]  [place] [address] [place], [place] [address]    Culture   Final    NO GROWTH Performed at [facility], [place] [address] [place], [place] [address]    Report Status 01/12/2018 FINAL  Final  Culture, blood (Routine x 2)     Status: None (Preliminary result)   Collection Time: 01/11/18  5:24 AM  Result Value Ref Range Status   Specimen Description BLOOD RIGHT ANTECUBITAL  Final   Special Requests   Final    BOTTLES DRAWN AEROBIC AND ANAEROBIC Blood Culture adequate volume Performed at [facility] [address], [place], [place] [address]    Culture NO GROWTH 3 DAYS  Final   Report Status PENDING  Incomplete  Aerobic/Anaerobic Culture (surgical/deep wound)     Status: None (Preliminary result)   Collection Time: 01/11/18  6:23 AM  Result Value Ref Range Status   Specimen Description   Final    ABSCESS ABDOMEN Performed at [facility] [address] [place], [place] [address]    Special Requests   Final    PATIENT ON FOLLOWING VANC/FLAGYL Performed at [place] [address] [place], [place] [address]    Gram Stain   Final    RARE WBC PRESENT, PREDOMINANTLY PMN MODERATE GRAM NEGATIVE COCCOBACILLI FEW GRAM POSITIVE COCCI IN PAIRS Performed at [facility], [place] [address] [place], [place] [address]    Culture RARE NORMAL SKIN FLORA  Final   Report Status PENDING  Incomplete  Surgical PCR screen     Status: Abnormal   Collection Time: 01/11/18 12:30 PM  Result Value Ref Range Status   MRSA, PCR POSITIVE (A) NEGATIVE Final    Comment: RESULT CALLED TO, READ BACK BY AND VERIFIED WITH: K.CARTER AT 1602 ON 01/11/18 BY N.THOMPSON    Staphylococcus aureus POSITIVE (A) NEGATIVE Final     Comment: (NOTE) The Xpert SA Assay (FDA approved for NASAL specimens in patients 54 years of age and older), is one component of a comprehensive surveillance program. It is not intended to diagnose infection nor to guide or monitor treatment. Performed at [facility], [place] [address] [place], [place] [address]      Labs: BNP (last 3 results) Recent Labs    05/05/17 0908  BNP 967.8*   Basic Metabolic Panel: Recent Labs  Lab 01/10/18 2216 01/11/18 0524 01/12/18 0606 01/13/18 0623 01/14/18 0543  NA 133* 132* 132* 136 138  K 4.3 4.4 5.0 4.5 4.2  CL 102 101 104 107 110  CO2 22 23 19* 20* 20*  GLUCOSE 139* 120* 154* 106* 97  BUN 29* 28* 33* 27* 21  CREATININE 2.01* 1.74* 1.80* 1.52* 1.30*  CALCIUM 9.4 8.9 9.1 9.4 9.4   Liver Function Tests: Recent Labs  Lab 01/10/18 2216  AST 14*  ALT 8  ALKPHOS 64  BILITOT 1.2  PROT 6.9  ALBUMIN 3.0*   No results for input(s): LIPASE, AMYLASE in the last 168 hours. No results for input(s): AMMONIA in the last 168 hours. CBC: Recent Labs  Lab 01/10/18 2216 01/11/18 0524 01/12/18 0606 01/13/18 0623  WBC 18.1* 14.9* 12.7* 9.4  NEUTROABS 15.7* 12.4* 11.1* 7.1  HGB 11.1* 9.7* 9.5* 10.1*  HCT 35.1* 30.8* 30.0* 31.6*  MCV 100.9* 100.7* 101.7* 99.4  PLT 200 174 170 229   Cardiac Enzymes: No results for input(s): CKTOTAL, CKMB, CKMBINDEX, TROPONINI in the last 168 hours. BNP: Invalid input(s): POCBNP  CBG: No results for input(s): GLUCAP in the last 168 hours. D-Dimer No results for input(s): DDIMER in the last 72 hours. Hgb A1c No results for input(s): HGBA1C in the last 72 hours. Lipid Profile No results for input(s): CHOL, HDL, LDLCALC, TRIG, CHOLHDL, LDLDIRECT in the last 72 hours. Thyroid function studies Recent Labs    01/13/18 0623  TSH 4.968*   Anemia work up Recent Labs    01/13/18 0623  VITAMINB12 74*  FOLATE 7.5  TIBC 236*  IRON 54   Urinalysis    Component Value Date/Time    COLORURINE YELLOW 01/10/2018 2216   APPEARANCEUR CLEAR 01/10/2018 2216   LABSPEC 1.016 01/10/2018 2216   [place] 5.0 01/10/2018 2216   GLUCOSEU NEGATIVE 01/10/2018 2216   HGBUR MODERATE (A) 01/10/2018 2216   BILIRUBINUR NEGATIVE 01/10/2018 2216   KETONESUR NEGATIVE 01/10/2018 2216   PROTEINUR NEGATIVE 01/10/2018 2216   UROBILINOGEN 0.2 10/24/2007 1052   NITRITE NEGATIVE 01/10/2018 2216   LEUKOCYTESUR NEGATIVE 01/10/2018 2216   Sepsis Labs Invalid input(s): PROCALCITONIN,  WBC,  LACTICIDVEN Microbiology Recent Results (from the past 240 hour(s))  Culture, blood (Routine x 2)     Status: None (Preliminary result)   Collection Time: 01/10/18 10:16 PM  Result Value Ref Range Status   Specimen Description BLOOD LEFT ANTECUBITAL  Final   Special Requests   Final    BOTTLES DRAWN AEROBIC AND ANAEROBIC Blood Culture adequate volume Performed at [facility], [place] [address] [place], [place] [address]    Culture NO GROWTH 4 DAYS  Final   Report Status PENDING  Incomplete  Urine culture     Status: None   Collection Time: 01/10/18 10:16 PM  Result Value Ref Range Status   Specimen Description   Final    URINE, CLEAN CATCH Performed at [facility], [place] [address] [place], [place] [address]    Special Requests   Final    Immunocompromised Performed at [facility], [place] [address] [place], [place] [address]    Culture   Final    NO GROWTH Performed at [facility], [place] [address] [place], [place] [address]    Report Status 01/12/2018 FINAL  Final  Culture, blood (Routine x 2)     Status: None (Preliminary result)   Collection Time: 01/11/18  5:24 AM  Result Value Ref Range Status   Specimen Description BLOOD RIGHT ANTECUBITAL  Final   Special Requests   Final    BOTTLES DRAWN AEROBIC AND ANAEROBIC Blood Culture adequate volume Performed at [facility] [address], [place], [place]  [address]    Culture NO GROWTH 3 DAYS  Final   Report Status PENDING  Incomplete  Aerobic/Anaerobic Culture (surgical/deep wound)     Status: None (Preliminary result)   Collection Time: 01/11/18  6:23 AM  Result Value Ref Range Status   Specimen Description   Final    ABSCESS ABDOMEN Performed at [facility] [address], [place], [place] [address]    Special Requests   Final    PATIENT ON FOLLOWING VANC/FLAGYL Performed at [facility] [address] [place], [place] [address]    Gram Stain   Final    RARE WBC PRESENT, PREDOMINANTLY PMN MODERATE GRAM NEGATIVE COCCOBACILLI FEW GRAM POSITIVE COCCI IN PAIRS Performed at [facility], [place] [address], [place], [place] [address]    Culture RARE NORMAL SKIN FLORA  Final  Report Status PENDING  Incomplete  Surgical PCR screen     Status: Abnormal   Collection Time: 01/11/18 12:30 PM  Result Value Ref Range Status   MRSA, PCR POSITIVE (A) NEGATIVE Final    Comment: RESULT CALLED TO, READ BACK BY AND VERIFIED WITH: K.CARTER AT 1602 ON 01/11/18 BY N.THOMPSON    Staphylococcus aureus POSITIVE (A) NEGATIVE Final    Comment: (NOTE) The Xpert SA Assay (FDA approved for NASAL specimens in patients 37 years of age and older), is one component of a comprehensive surveillance program. It is not intended to diagnose infection nor to guide or monitor treatment. Performed at [facility], [place] [address], [place], [place] [address]      Time coordinating discharge: 79  SIGNED:   Cristy Folks, MD  [facility] 01/14/2018, 9:48 AM  If 7PM-7AM, please contact night-coverage [email or web address] Password [email or web address]

## 2018-01-14 NOTE — Progress Notes (Signed)
3 Days Post-Op   Subjective/Chief Complaint:  1 - Subcutaneous Wound Abscess - s/p operative I+D of large abscess above fascia 01/11/18. [place] 1/2 GPC/GNC / pending, BCX 1/2 - NGTD, UCX 1/2 - Neg, MRSA PCR - Positive. Placed on Vanc + Cefepime pending further CX data. Wet to Dry dressing changes by nursing.  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 Cr <1.5 this admission.  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   Today " [name] " is stable. No fevers. Remains on broad IV ABX pending further CX data. Dressing changes going well. Still no further CX data to guide therapy as of yet.    Objective: Vital signs in last 24 hours: Temp:  [97.7 F (36.5 C)-97.8 F (36.6 C)] 97.7 F (36.5 C) (01/05 0417) Pulse Rate:  [58-71] 67 (01/05 0417) Resp:  [18-20] 18 (01/05 0417) BP: (114-149)/(64-88) 149/88 (01/05 0417) SpO2:  [93 %-100 %] 97 % (01/05 0417) Weight:  [98.2 kg] 98.2 kg (01/05 0626) Last BM Date: 01/13/18  Intake/Output from previous day: 01/04 0701 - 01/05 0700 In: 8588 [P.O.:690; IV Piggyback:500] Out: 1700 [Urine:1700] Intake/Output this shift: No intake/output data recorded.   General appearance: alert, cooperative and appears stated age Head: Normocephalic, without obvious abnormality, atraumatic Eyes: negative Nose: Nares normal. Septum midline. Mucosa normal. No drainage or sinus tenderness. Throat: lips, mucosa, and tongue normal; teeth and gums normal Neck: supple, symmetrical, trachea midline Back: symmetric, no curvature. ROM normal. No CVA tenderness. Resp: non-labored on room air Cardio: Nl rate Extremities: extremities normal, atraumatic, no cyanosis or edema Neurologic: Grossly normal Incision/Wound: wet to dry dressing in place that is non-foul. Dressing taken down and fascia intact / wound edges clean.   Lab Results:  Recent Labs    01/12/18 0606 01/13/18 0623   WBC 12.7* 9.4  HGB 9.5* 10.1*  HCT 30.0* 31.6*  PLT 170 229   BMET Recent Labs    01/13/18 0623 01/14/18 0543  NA 136 138  K 4.5 4.2  CL 107 110  CO2 20* 20*  GLUCOSE 106* 97  BUN 27* 21  CREATININE 1.52* 1.30*  CALCIUM 9.4 9.4   PT/INR No results for input(s): LABPROT, INR in the last 72 hours. ABG No results for input(s): PHART, HCO3 in the last 72 hours.  Invalid input(s): PCO2, PO2  Studies/Results: No results found.  Anti-infectives: Anti-infectives (From admission, onward)   Start     Dose/Rate Route Frequency Ordered Stop   01/12/18 2200  vancomycin (VANCOCIN) IVPB 1000 mg/200 mL premix  Status:  Discontinued     1,000 mg 200 mL/hr over 60 Minutes Intravenous Every 48 hours 01/11/18 0513 01/11/18 0950   01/12/18 1200  vancomycin (VANCOCIN) IVPB 1000 mg/200 mL premix     1,000 mg 200 mL/hr over 60 Minutes Intravenous Every 36 hours 01/11/18 0950     01/12/18 0600  ceFEPIme (MAXIPIME) 2 g in sodium chloride 0.9 % 100 mL IVPB  Status:  Discontinued     2 g 200 mL/hr over 30 Minutes Intravenous Every 24 hours 01/11/18 0506 01/11/18 0950   01/11/18 1200  ceFEPIme (MAXIPIME) 1 g in sodium chloride 0.9 % 100 mL IVPB     1 g 200 mL/hr over 30 Minutes Intravenous Every 12 hours 01/11/18 0950     01/11/18 0600  metroNIDAZOLE (FLAGYL) IVPB 500 mg     500 mg 100 mL/hr over  60 Minutes Intravenous Every 8 hours 01/11/18 0453     01/11/18 0515  ceFEPIme (MAXIPIME) 2 g in sodium chloride 0.9 % 100 mL IVPB     2 g 200 mL/hr over 30 Minutes Intravenous  Once 01/11/18 0503 01/11/18 0600   01/11/18 0245  piperacillin-tazobactam (ZOSYN) IVPB 3.375 g  Status:  Discontinued     3.375 g 100 mL/hr over 30 Minutes Intravenous  Once 01/11/18 0233 01/11/18 0503   01/11/18 0245  clindamycin (CLEOCIN) IVPB 900 mg  Status:  Discontinued     900 mg 100 mL/hr over 30 Minutes Intravenous  Once 01/11/18 0233 01/11/18 0453   01/10/18 2315  vancomycin (VANCOCIN) 1,750 mg in sodium  chloride 0.9 % 500 mL IVPB     1,750 mg 250 mL/hr over 120 Minutes Intravenous  Once 01/10/18 2302 01/11/18 0206   01/10/18 2300  vancomycin (VANCOCIN) IVPB 1000 mg/200 mL premix  Status:  Discontinued     1,000 mg 200 mL/hr over 60 Minutes Intravenous  Once 01/10/18 2257 01/10/18 2301      Assessment/Plan:  1 - Subcutaneous Wound Abscess - continue wet to dry dressing changes, this will heal by secondary intent and take weeks, but this is safest way to heal in setting of infection. Will likely require home health dressing changes at discharge daily.   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   3- High Grade Kidney Cancer - no further cancer directed care indicated   Appreciate hostpitalist team co-management. Please call our team directly anytime with concerns.   [name] 01/14/2018

## 2018-01-15 LAB — CULTURE, BLOOD (ROUTINE X 2)
Culture: NO GROWTH
Special Requests: ADEQUATE

## 2018-01-16 LAB — AEROBIC/ANAEROBIC CULTURE W GRAM STAIN (SURGICAL/DEEP WOUND): Culture: NORMAL

## 2018-01-16 LAB — CULTURE, BLOOD (ROUTINE X 2)
Culture: NO GROWTH
Special Requests: ADEQUATE

## 2018-01-16 LAB — AEROBIC/ANAEROBIC CULTURE (SURGICAL/DEEP WOUND)

## 2018-02-06 ENCOUNTER — Other Ambulatory Visit: Payer: Self-pay

## 2018-02-06 MED ORDER — IRBESARTAN 300 MG PO TABS: 300.0000 mg | ORAL_TABLET | Freq: Every day | ORAL | 1 refills | Status: DC

## 2018-02-09 NOTE — Progress Notes (Signed)
[name] Date of Birth: 08-04-1940   History of Present Illness: [name] is seen today for follow up CAD, AS, and PAD. She has known CAD with redo CABG in 2001.  She has a known abdominal aortic aneurysm with last ultrasound  showing a  3.1 cm aneurysm in August 2018. She has a history of PAD with right iliac occlusion. Last ABIs in August 2018 showed stable right ABI of 0.75.  She has chronic claudication symptoms with pain in right thigh with walking for long distances. This has not changed.[name] Her last nuclear stress test in January 2016 showed  anterior wall ischemia. This was unchanged from 2012 and 2013. She had a cardiac cath in 2008 which  showed patent grafts.   She has  lymphocytic colitis. Followed by Mindy. Gala [name]. This flared last Spring and she was placed on Entecort. Following this she developed edema and elevated BP. She states she felt dizzy, HA, and "crazy". She was started on Avapro. Symptoms resolved after she came off steroids.   Over the past year or so she has experienced significant progression of symptoms of exertional shortness of breath and fatigue. Follow-up echocardiogram performed March 29, 2017 revealed normal left ventricular systolic function with severe aortic stenosis. Peak velocity across the aortic valve measured 4.1 m/s corresponding to mean transvalvular gradient estimated 39 mmHg. The DVI was reported 0.20 with aortic valve area calculated 0.69 cm. Left ventricular ejection fraction was estimated 55-60%. Cardiac cath performed 04/04/2017 revealed peak to peak gradient of 36 mmHg and a mean gradient of 32 mmHg and severe 3 vessel CAD with continued patency of LIMA to mid LAD, free RIMA to the distal LAD, and sequential SVG to the first and second OM of the Cx.   She underwent successful TAVR with a 26 mm Sapien 3 on 05/09/2017. Post operative echo showed a normally functioning TAVR with no significant PVL; mean gradient 11 mm Hg. She was discharged on ASA and  plavix.   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 She returned in early January with sepsis related to an incisional abdominal wall abscess. This was surgically drained and she was treated with antibiotics. Blood cultures were negative.   On follow up today she is doing better. Her appetite has improved. She has no dyspnea, chest pain or edema. No palpitations. Wound is healing. No fever or chills.   Current Outpatient Medications on File Prior to Visit  Medication Sig Dispense Refill  . acetaminophen (TYLENOL) 500 MG tablet Take 1,000 mg by mouth every 6 (six) hours as needed for moderate pain or headache.     . ALPRAZolam (XANAX) 0.25 MG tablet Take 0.25 mg by mouth 3 (three) times daily as needed for anxiety.  0  . amLODipine (NORVASC) 10 MG tablet TAKE 1 TABLET BY MOUTH EVERY DAY 30 tablet 10  . aspirin EC 81 MG tablet Take 81 mg by mouth at bedtime.     [name] BYSTOLIC 20 MG TABS TAKE 1 TABLET BY MOUTH EVERY DAY (Patient taking differently: Take 20 mg by mouth every evening. At bedtime) 30 tablet 5  . calcium elemental as carbonate (BARIATRIC TUMS ULTRA) 400 MG chewable tablet Chew 1,000 mg by mouth 3 (three) times daily as needed for heartburn.    . chlorthalidone (HYGROTON) 25 MG tablet Take 12.5 mg by mouth daily.    [name] FLUoxetine (PROZAC) 40 MG capsule Take 40 mg by mouth every evening.    [name]  folic acid (FOLVITE) 1 MG tablet Take 1 tablet (1 mg total) by mouth daily. 30 tablet 1  . furosemide (LASIX) 20 MG tablet Take 20 mg by mouth daily as needed for edema.    Derrill Memo ON 02/14/2018] irbesartan (AVAPRO) 300 MG tablet Take 1 tablet (300 mg total) by mouth daily. 90 tablet 1  . levothyroxine (SYNTHROID, LEVOTHROID) 50 MCG tablet Take 50 mcg by mouth daily before breakfast.    . loperamide (IMODIUM) 2 MG capsule Take 2-4 mg by mouth 4 (four) times daily as needed for diarrhea or loose stools.     . nitroGLYCERIN (NITROSTAT) 0.4 MG SL tablet  Place 0.4 mg under the tongue every 5 (five) minutes as needed for chest pain (MAX 3 TABLETS).    . pantoprazole (PROTONIX) 40 MG tablet Take 1 tablet (40 mg total) by mouth daily. 30 tablet 11  . PROAIR HFA 108 (90 Base) MCG/ACT inhaler Inhale 2 puffs into the lungs every 4 (four) hours as needed for shortness of breath or wheezing.  2  . simethicone (MYLICON) 166 MG chewable tablet Chew 125 mg by mouth every 6 (six) hours as needed for flatulence.    . simvastatin (ZOCOR) 40 MG tablet Take 1 tablet (40 mg total) by mouth at bedtime. 90 tablet 0  . SYMBICORT 160-4.5 MCG/ACT inhaler Inhale 1 puff into the lungs 2 (two) times daily as needed (for respiratory issues.).   2  . vitamin B-12 1000 MCG tablet Take 1 tablet (1,000 mcg total) by mouth daily. 30 tablet 1   No current facility-administered medications on file prior to visit.     Allergies  Allergen Reactions  . Chantix [Varenicline Tartrate] Hives  . Dipyridamole Hives  . Varenicline Hives    Past Medical History:  Diagnosis Date  . Abdominal aortic aneurysm ([place])    infrarenal diagnosed with maximum measurement at 3.3 cm  . Coronary artery disease   . Depression   . Diverticulosis   . GERD (gastroesophageal reflux disease)   . History of tobacco abuse    Recurrent cigarette smoking  . Hyperlipidemia   . Hyperplastic colon polyp   . Hypertension   . Hypothyroidism   . Ischemic heart disease    had redo bypass surgery in 2001, at that time she had a free right internal mammary to the LAD, LAD endarterectomy, sequential vein graft to the OM1 and OM2 -- last catheterization was in 2008  . Leg fracture    remote right leg fracture  . Microscopic colitis   . Obesity    with gastric bypass surgery in 2004  . PAD (peripheral artery disease) ([facility])    right iliac occlusion  . PONV (postoperative nausea and vomiting)   . Right kidney mass   . S/P CABG x 4 1985   LIMA to LAD, Sequential SVG to OM1-OM2, SVG to RCA - [name]   . S/P redo CABG x 3 2001   Free RIMA to LAD with endarterectomy, sequential SVG to OM1-OM2 - [name]  . 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 ARTERIAL LINE INSERTION Right 05/09/2017   Procedure: ARTERIAL LINE  INSERTION  RIGHT RADIAL ARTERY;  Surgeon: Sherren Mocha, MD;  Location: [place];  Service: Open Heart Surgery;  Laterality: Right;  . CARDIAC CATHETERIZATION  02/16/1999   Left heart  catheterization with selective coronary  angiography, left ventricular angiography (RAO and LAO views), angiography of left internal mammary artery, and angiography of saphenous vein grafts  . CARDIAC CATHETERIZATION  10/20/2006   Est. EF of 55% -- Totally occuladed native coronary circulation -- Persistent patency of the right mammary graft and the distal LAD artery with peristent patency of the left mammary graft to the proximal LAD artery with persistant patency of the saphenous vein graft to the sequentail branches of the OM        . CARPAL TUNNEL RELEASE    . CHOLECYSTECTOMY    . COLONOSCOPY  07/12/2007   Mindy.Brodie- normal cecum and rectum, diverticulosis in the sigmoid colon  . COLONOSCOPY  1991   per Mindy.Brodie office visit note= hyperplastic polyp  . COLONOSCOPY N/A 09/30/2014   Mindy.Rourk- noraml appearing rectal mucosa, scattered L sided diverticula, colonic mucosa has a somewhat pale friable appearence diffusely, no ulcers or erosions seen, the distal 10cm of terminal ileum appeared entirely normal. bx=benign colorectal mucosa with lymphocytic colitis  . CORONARY ARTERY BYPASS GRAFT  1985  . CORONARY ARTERY BYPASS GRAFT  03/09/1999   Redo CABG x3 --  with the free right internal mammary artey to the LAD with an LAD endarterectomy -- Sequential saphenous vein graft to OM1 and OM2                   .  CYSTOSCOPY/RETROGRADE/URETEROSCOPY Right 12/21/2017   Procedure: ERDEYCXKGY/JEHUDJSHFW;  Surgeon: Raynelle Bring, MD;  Location: [facility];  Service: Urology;  Laterality: Right;  . ESOPHAGOGASTRODUODENOSCOPY N/A 09/30/2014   Mindy.Rourk-mild erosive reflux esophagitis s/p bariatric surgery  . EYE SURGERY     catracts  . GASTRIC BYPASS  2004  . INCISION AND DRAINAGE ABSCESS N/A 01/11/2018   Procedure: INCISION AND DRAINAGE ABSCESS;  Surgeon: Raynelle Bring, MD;  Location: [facility];  Service: Urology;  Laterality: N/A;  . LAPAROSCOPIC NEPHRECTOMY Right 12/28/2017   Procedure: LAPAROSCOPIC RADICAL NEPHRECTOMY;  Surgeon: Raynelle Bring, MD;  Location: [facility];  Service: Urology;  Laterality: Right;  . RIGHT/LEFT HEART CATH AND CORONARY/GRAFT ANGIOGRAPHY N/A 04/04/2017   Procedure: RIGHT/LEFT HEART CATH AND CORONARY/GRAFT ANGIOGRAPHY;  Surgeon: Martinique, Amoreena Neubert M, MD;  Location: [facility];  Service: Cardiovascular;  Laterality: N/A;  . TEE WITHOUT CARDIOVERSION N/A 05/09/2017   Procedure: TRANSESOPHAGEAL ECHOCARDIOGRAM (TEE);  Surgeon: Sherren Mocha, MD;  Location: [place];  Service: Open Heart Surgery;  Laterality: N/A;  . TRANSCATHETER AORTIC VALVE REPLACEMENT, TRANSFEMORAL N/A 05/09/2017   Procedure: TRANSCATHETER AORTIC VALVE REPLACEMENT, TRANSFEMORAL using a 56mm Edwards Sapien 3 Aortic Valve;  Surgeon: Sherren Mocha, MD;  Location: [place];  Service: Open Heart Surgery;  Laterality: N/A;    Social History   Tobacco Use  Smoking Status Current Some Day Smoker  . Packs/day: 0.50  . Types: Cigarettes  . Start date: 01/10/1985  . Last attempt to quit: 02/11/2016  . Years since quitting: 2.0  Smokeless Tobacco Never Used  Tobacco Comment   smokes 1-2 cigarettes a day    Social History   Substance and Sexual Activity  Alcohol Use Yes  . Alcohol/week: 0.0 standard drinks   Comment: rarely wine    Family History  Problem Relation Age of Onset  . Stroke Father   . Diabetes Mother   .  Pancreatic cancer Mother     Review of Systems: The review of systems is as above.  All other systems were reviewed and are negative.  Physical Exam: BP 120/64  Pulse 71   Ht 5\' 7"  (1.702 m)   Wt 203 lb 6.4 oz (92.3 kg)   BMI 31.86 kg/m  GENERAL:  Obese WF in NAD HEENT:  PERRL, EOMI, sclera are clear. Oropharynx is clear. NECK:  No jugular venous distention, carotid upstroke brisk and symmetric, bilateral  bruits, no thyromegaly or adenopathy LUNGS:  Clear to auscultation bilaterally CHEST:  Unremarkable HEART:  RRR,  PMI not displaced or sustained,S1 and S2 within normal limits, no S3, no S4: no clicks, no rubs, slight  systolic murmur RUSB ABD:  Soft, nontender. BS + EXT: pulses in feet not palpable, feet warm and dry. no edema, no cyanosis no clubbing SKIN:  Warm and dry.  No rashes NEURO:  Alert and oriented x 3. Cranial nerves II through XII intact. PSYCH:  Cognitively intact     LABORATORY DATA: Lab Results  Component Value Date   CHOL 149 02/04/2014   HDL 60 02/04/2014   LDLCALC 64 02/04/2014   TRIG 124 02/04/2014   CHOLHDL 2.5 02/04/2014   Lab Results  Component Value Date   WBC 9.4 01/13/2018   HGB 10.1 (L) 01/13/2018   HCT 31.6 (L) 01/13/2018   PLT 229 01/13/2018   GLUCOSE 97 01/14/2018   CHOL 149 02/04/2014   TRIG 124 02/04/2014   HDL 60 02/04/2014   LDLCALC 64 02/04/2014   ALT 8 01/10/2018   AST 14 (L) 01/10/2018   NA 138 01/14/2018   K 4.2 01/14/2018   CL 110 01/14/2018   CREATININE 1.30 (H) 01/14/2018   BUN 21 01/14/2018   CO2 20 (L) 01/14/2018   TSH 4.968 (H) 01/13/2018   INR 1.01 01/10/2018   HGBA1C 5.8 (H) 05/05/2017   Labs dated 10/05/15: glucose 108. Otherwise CMET is normal. Cholesterol 130, triglycerides 114, HDL 63, LDL 44. TSH normal.  Dated 11/02/16: cholesterol 138, triglycerides 78, LDL 49, HDL 73. CMET and CBC normal.  Echo 04/19/16: Study Conclusions  - Left ventricle: The cavity size was normal. Wall thickness was    normal. Systolic function was normal. The estimated ejection   fraction was in the range of 60% to 65%. Wall motion was normal;   there were no regional wall motion abnormalities. Doppler   parameters are consistent with pseudonormal left ventricular   relaxation (grade 2 diastolic dysfunction). The E/e&' ratio is   >15, suggesting elevated LV filling pressure. - Aortic valve: Calcified leaflets. Moderate stenosis. Mean   gradient (S): 35 mm Hg. Peak gradient (S): 61 mm Hg. Valve area   (VTI): 0.98 cm^2. Valve area (Vmax): 1.05 cm^2. Valve area   (Vmean): 0.98 cm^2. - Mitral valve: Mildly thickened leaflets . There was mild   regurgitation. - Left atrium: The atrium was moderately dilated. - Right ventricle: The cavity size was mildly dilated. Systolic   function is mildly reduced. - Tricuspid valve: There was mild regurgitation. - Pulmonary arteries: PA peak pressure: 34 mm Hg (S). - Inferior vena cava: The vessel was normal in size. The   respirophasic diameter changes were in the normal range (>= 50%),   consistent with normal central venous pressure.  Impressions:  - Compared to a prior study in 2012, there is now moderate aortic   stenosis - AVA around 1.05 cm2 with mean gradient of 35 mmHg.  Echo 03/29/17: Study Conclusions  - Left ventricle: The cavity size was normal. Wall thickness was   increased in a pattern of mild LVH. Systolic function was normal.   The estimated ejection  fraction was in the range of 55% to 60%.   Wall motion was normal; there were no regional wall motion   abnormalities. Doppler parameters are consistent with abnormal   left ventricular relaxation (grade 1 diastolic dysfunction). - Aortic valve: Poorly visualized. Probably trileaflet; severely   calcified leaflets. There was severe stenosis. There was mild   regurgitation. Mean gradient (S): 41 mm Hg. Peak gradient (S): 67   mm Hg. Valve area (VTI): 0.69 cm^2. - Mitral valve: Mildly calcified  annulus. Mildly calcified leaflets   . There was trivial regurgitation. - Left atrium: The atrium was moderately dilated. - Right ventricle: The cavity size was normal. Systolic function   was mildly reduced. - Tricuspid valve: Peak RV-RA gradient (S): 32 mm Hg. - Pulmonary arteries: PA peak pressure: 35 mm Hg (S). - Inferior vena cava: The vessel was normal in size. The   respirophasic diameter changes were in the normal range (>= 50%),   consistent with normal central venous pressure.  Impressions:  - Normal LV size with mild LV hypertrophy. EF 55-60%. Severe aortic   stenosis, mild aortic insufficiency. Moderate left atrial   enlargement. Normal RV size with mildly decreased systolic   function.  Cardiac cath 04/04/17:  RIGHT/LEFT HEART CATH AND CORONARY/GRAFT ANGIOGRAPHY  Conclusion     Ost LAD to Prox LAD lesion is 100% stenosed.  Prox LAD to Mid LAD lesion is 100% stenosed.  Ost Cx to Mid Cx lesion is 100% stenosed.  Prox RCA to Mid RCA lesion is 100% stenosed.  LIMA graft was visualized by angiography and is normal in caliber.  The graft exhibits no disease.  Seq SVG- OM 1 and OM 2 graft was visualized by angiography and is large.  SVG graft was visualized by angiography and is normal in caliber.  The graft exhibits no disease.  Dist Graft lesion is 30% stenosed.  Origin lesion is 35% stenosed.  There is severe aortic valve stenosis.  LV end diastolic pressure is mildly elevated.  Hemodynamic findings consistent with mild pulmonary hypertension.   1. Severe 3 vessel occlusive CAD 2. Patent LIMA to the mid LAD. This only supplies a septal perforator.  3. Patent SVG to mid to distal LAD 4. Patent sequential SVG to OM 1 and OM 2 5. Severe Aortic stenosis. Mean gradient 32 mm Hg, peak gradient 35 mm Hg. Valve area 1.1 cm squared. Index 0.56 6. Normal cardiac output 7. Mild pulmonary HTN with mildly elevated LV filling pressures.   Plan: Coronary  circulation is unchanged from 2008. Patient has progressive symptomatic aortic stenosis. Will refer for TAVR evaluation.    Echo 06/01/17: Study Conclusions  - Left ventricle: The cavity size was normal. Wall thickness was   increased in a pattern of mild LVH. Systolic function was normal.   The estimated ejection fraction was in the range of 55% to 60%.   Wall motion was normal; there were no regional wall motion   abnormalities. Doppler parameters are consistent with abnormal   left ventricular relaxation (grade 1 diastolic dysfunction).   Doppler parameters are consistent with high ventricular filling   pressure. - Aortic valve: A bioprosthesis was present. Valve area (VTI): 1.56   cm^2. - Mitral valve: There was mild regurgitation. - Left atrium: The atrium was mildly dilated.  Impressions:  - Normal LV systolic function; mild LVH; mild diastolic   dysfunction; s/p AVR with mean gradient 14 mmHg and no AI; mild   MR; mild LAE; mild TR.  Assessment / Plan: 1. Coronary disease status post CABG. Redo coronary bypass surgery in 2001 with sequential saphenous vein graft to the first and second obtuse marginal vessels and a free RIMA graft to the LAD. Prior LIMA graft to the proximal LAD. Myoview study January 2016 was stable showing anterior ischemia similar to prior studies.  Cardiac cath in March 2019 showed patent grafts. No  angina. Continue aspirin, beta blocker, and statin therapy.   2. Severe Aortic stenosis s/p TAVR in April 2019. . Follow up Echo satisfactory. Off Plavix now. Routine  SBE prophylaxis. No evidence of endocarditis with recent infection.  3. PAD with right iliac occlusion. Claudication more noticeable since she is more active.  Dopplers in August 2018 showed stable ABI of 0.75 on the right and normal on the left. Abd/pelvic CT in April 2019 was stable.   4. Abdominal aortic aneurysm measuring 3.4 x 3.0 cm by CT. Will follow yearly.  4. Hypertension- well  controlled. Will discontinue HCTZ. Continue other meds.   6. Tobacco abuse-encouraged complete cessation. Smoking only occasionally.   7. Obesity. 6 lb weight loss with recent illness.   8. Lymphocytic colitis. Per Mindy. Gala [name].  9. Carotid arterial disease- no significant stenosis.   10. 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 S/p I/D of abdominal wall abscess Jan 2020.    I will follow up in 6 months.

## 2018-02-12 ENCOUNTER — Ambulatory Visit: Payer: Medicare Other | Admitting: Cardiology

## 2018-02-12 ENCOUNTER — Encounter: Payer: Self-pay | Admitting: Cardiology

## 2018-02-12 VITALS — BP 120/64 | HR 71 | Ht 67.0 in | Wt 203.4 lb

## 2018-02-12 DIAGNOSIS — Z952 Presence of prosthetic heart valve: Secondary | ICD-10-CM

## 2018-02-12 DIAGNOSIS — E7800 Pure hypercholesterolemia, unspecified: Secondary | ICD-10-CM

## 2018-02-12 DIAGNOSIS — I25810 Atherosclerosis of coronary artery bypass graft(s) without angina pectoris: Secondary | ICD-10-CM | POA: Diagnosis not present

## 2018-02-12 DIAGNOSIS — I739 Peripheral vascular disease, unspecified: Secondary | ICD-10-CM | POA: Diagnosis not present

## 2018-02-12 DIAGNOSIS — I6523 Occlusion and stenosis of bilateral carotid arteries: Secondary | ICD-10-CM | POA: Diagnosis not present

## 2018-02-12 DIAGNOSIS — I10 Essential (primary) hypertension: Secondary | ICD-10-CM

## 2018-02-12 NOTE — Addendum Note (Signed)
Addended by: Kathyrn Lass on: 02/12/2018 03:37 PM   Modules accepted: Orders

## 2018-02-14 ENCOUNTER — Other Ambulatory Visit: Payer: Self-pay

## 2018-02-14 MED ORDER — SIMVASTATIN 40 MG PO TABS: 40.0000 mg | ORAL_TABLET | Freq: Every day | ORAL | 3 refills | Status: DC

## 2018-02-14 NOTE — Telephone Encounter (Signed)
Rx(s) sent to pharmacy electronically.  

## 2018-02-21 ENCOUNTER — Other Ambulatory Visit: Payer: Self-pay

## 2018-02-21 MED ORDER — NEBIVOLOL HCL 20 MG PO TABS: 1.0000 | ORAL_TABLET | Freq: Every day | ORAL | 11 refills | Status: DC

## 2018-02-21 NOTE — Telephone Encounter (Signed)
Rx(s) sent to pharmacy electronically.  

## 2018-04-11 ENCOUNTER — Telehealth: Payer: Self-pay | Admitting: Cardiology

## 2018-04-11 NOTE — Telephone Encounter (Signed)
Returned call to patient she wanted to ask if ok to use generic Voltaren oint for knee pain.Advised I will send message to pharmacy for advice.

## 2018-04-11 NOTE — Telephone Encounter (Signed)
F/U Message         Patient call back, she states she gave the wrong Phone # so this is the correct one [phone]

## 2018-04-11 NOTE — Telephone Encounter (Signed)
Returned call to patient - she is okay to use the voltaren topical gel.  Does not have the same concerns that oral voltaren would.  Patient voiced understanding

## 2018-04-11 NOTE — Telephone Encounter (Signed)
Patient wants to know if it ok to use typical ointment of her knee, for knee pain.

## 2018-04-14 ENCOUNTER — Other Ambulatory Visit: Payer: Self-pay | Admitting: Cardiology

## 2018-04-16 NOTE — Telephone Encounter (Signed)
Amlodipine refilled.

## 2018-04-20 ENCOUNTER — Telehealth: Payer: Self-pay

## 2018-04-20 ENCOUNTER — Other Ambulatory Visit: Payer: Self-pay

## 2018-04-20 DIAGNOSIS — Z952 Presence of prosthetic heart valve: Secondary | ICD-10-CM

## 2018-04-20 DIAGNOSIS — I350 Nonrheumatic aortic (valve) stenosis: Secondary | ICD-10-CM

## 2018-04-20 NOTE — Telephone Encounter (Signed)
YOUR CARDIOLOGY TEAM HAS ARRANGED FOR AN E-VISIT FOR YOUR APPOINTMENT - PLEASE REVIEW IMPORTANT INFORMATION BELOW SEVERAL DAYS PRIOR TO YOUR APPOINTMENT  Due to the recent COVID-19 pandemic, we are transitioning in-person office visits to tele-medicine visits in an effort to decrease unnecessary exposure to our patients and staff. Medicare and most insurances are covering these visits without a copay needed. We also encourage you to sign up for MyChart if you have not already done so. You will need a smartphone if possible. For patients that do not have this, we can still complete the visit using a regular telephone but do prefer a smartphone to enable video when possible. You may have a close family member that lives with you that can help. If possible, we also ask that you have a blood pressure cuff and scale at home to measure your blood pressure, heart rate and weight prior to your scheduled appointment. Patients with clinical needs that need an in-person evaluation and testing will still be able to come to the office if absolutely necessary. If you have any questions, feel free to call our office.   CONSENT FOR TELE-HEALTH VISIT - PLEASE REVIEW  I hereby voluntarily request, consent and authorize [facility] and its employed or contracted physicians, physician assistants, nurse practitioners or other licensed health care professionals (the Practitioner), to provide me with telemedicine health care services (the "Services") as deemed necessary by the treating Practitioner.I acknowledge and consent to receive the Services by the Practitioner via telemedicine. I understand that the telemedicine visit will involve communicating with the Practitioner through live audiovisual communication technology and the disclosure of certain medical information by electronic transmission. I acknowledge that I have been given the opportunity to request an in-person assessment or other available alternative prior to  the telemedicine visit and am voluntarily participating in the telemedicine visit.  I understand that I have the right to withhold or withdraw my consent to the use of telemedicine in the course of my care at any time, without affecting my right to future care or treatment, and that the Practitioner or I may terminate the telemedicine visit at any time. I understand that I have the right to inspect all information obtained and/or recorded in the course of the telemedicine visit and may receive copies of available information for a reasonable fee.  I understand that some of thepotential risksof receiving the Services via telemedicine include:  Delay or interruption in medical evaluation due to technological equipment failure or disruption;  Information transmitted may not be sufficient (e.g. poor resolution of images) to allow for appropriate medical decision making by the Practitioner; and/or   In rare instances, security protocols could fail, causing a breach of personal health information.  Furthermore, I acknowledge that it is my responsibility to provide information about my medical history, conditions and care that is complete and accurate to the best of my ability. I acknowledge that Practitioner's advice, recommendations, and/or decision may be based on factors not within their control, such as incomplete or inaccurate data provided by me or distortions of diagnostic images or specimens that may result from electronic transmissions. I understand that the practice of medicine is not an exact science and that Practitioner makes no warranties or guarantees regarding treatment outcomes. I acknowledge that I will receive a copy of this consent concurrently upon execution via email to the email address I last provided but may also request a printed copy by calling the office of [place].    I understand  that my insurance will be billed for this visit.   I have read or had this consent read  to me.  I understand the contents of this consent, which adequately explains the benefits and risks of the Services being provided via telemedicine.   I have been provided ample opportunity to ask questions regarding this consent and the Services and have had my questions answered to my satisfaction.  I give my informed consent for the services to be provided through the use of telemedicine in my medical care  By participating in this telemedicine visit I agree to the above.

## 2018-04-23 ENCOUNTER — Other Ambulatory Visit: Payer: Self-pay

## 2018-04-23 ENCOUNTER — Telehealth: Payer: Medicare Other | Admitting: Physician Assistant

## 2018-04-26 NOTE — Progress Notes (Signed)
[facility]   MULTIDISCIPLINARY HEART VALVE TEAM     Virtual Visit via Video Note   This visit type was conducted due to national recommendations for restrictions regarding the COVID-19 Pandemic (e.g. social distancing) in an effort to limit this patient's exposure and mitigate transmission in our community.  Due to her co-morbid illnesses, this patient is at least at moderate risk for complications without adequate follow up.  This format is felt to be most appropriate for this patient at this time.  All issues noted in this document were discussed and addressed.  A limited physical exam was performed with this format.  Please refer to the patient's chart for her consent to telehealth for [facility].   Evaluation Performed:  Follow-up visit  Date:  04/27/2018   ID:  [name], DOB [birth year], MRN [id number]  Patient Location: Home Provider Location: Office  PCP:  [name], MD  Cardiologist:  Peter Martinique, MD / Dr. Burt Knack & Dr. Roxy Manns (TAVR) Electrophysiologist:  None   Chief Complaint:  1 year s/p TAVR  History of Present Illness:    [name] is a 78 y.o. 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   The patient does not have symptoms concerning for COVID-19 infection (fever, chills, cough, or new shortness of breath).   [name] developed heart murmur on physical exam and was noted to have aortic stenosis which has gradually progressed in severity over the last several years. Over the past year or so she has experienced significant progression of symptoms of exertional shortness of breath and fatigue. Follow-up echocardiogram performed March 29, 2017 revealed normal left ventricular systolic function with severe aortic stenosis. Peak velocity across the aortic valve  measured 4.1 m/s corresponding to mean transvalvular gradient estimated 39 mmHg. The DVI was reported 0.20 with aortic valve area calculated 0.69 cm. Left ventricular ejection fraction was estimated 55-60%. Cardiac cath performed 04/04/2017 revealed peak to peak gradient of 36 mmHg and a mean gradient of 32 mmHg and severe 3 vessel CAD with continued patency of LIMA to mid LAD, free RIMA to the distal LAD, and sequential SVG to the first and second OM of the Cx.   She underwent successful TAVR with a 26 mm Sapien 3 on 05/09/2017. Post operative echo showed a normally functioning TAVR with no significant PVL; mean gradient 11 mm Hg. She was discharged on ASA and plavix. 1 month echo showed EF 55%, normally functioning TAVR valve with mean gradient 14 mm HG and no PVL. Of note, she had a renal lesion found on pre TAVR scanning. This was found to be clear cell RCC. She underwent right laparoscopic radical nephrectomy in 12/2017 by Dr. Alinda Money. She was later in admitted in 01/2018 with sepsis due to large abdominal wall abscess at site of prior nephrectomy. This was surgically drained and treated with IV Abx.   Today she presents for follow up. She has been doing okay. Still recovering from her infection. If she hadn't gotten her infection after nephrectomy she said she would have been in great shape as she felt great after her TAVR. [facility] before the covid 19 pandemic, but now on quarantine she has been walking around the house. No CP or SOB. No LE edema, orthopnea  or PND. No dizziness or syncope. No blood in stool or urine. No palpitations.    Past Medical History:  Diagnosis Date   Abdominal aortic aneurysm ([place])    infrarenal diagnosed with maximum measurement at 3.3 cm   Coronary artery disease    Depression    Diverticulosis    GERD (gastroesophageal reflux disease)    History of tobacco abuse    Recurrent cigarette smoking   Hyperlipidemia    Hyperplastic  colon polyp    Hypertension    Hypothyroidism    Ischemic heart disease    had redo bypass surgery in 2001, at that time she had a free right internal mammary to the LAD, LAD endarterectomy, sequential vein graft to the OM1 and OM2 -- last catheterization was in 2008   Leg fracture    remote right leg fracture   Microscopic colitis    Obesity    with gastric bypass surgery in 2004   PAD (peripheral artery disease) ([facility])    right iliac occlusion   PONV (postoperative nausea and vomiting)    Right kidney mass    S/P CABG x 4 1985   LIMA to LAD, Sequential SVG to OM1-OM2, SVG to RCA - Dr Redmond Pulling   S/P redo CABG x 3 2001   Free RIMA to LAD with endarterectomy, sequential SVG to OM1-OM2 - Dr Azell Der TAVR (transcatheter aortic valve replacement) 05/09/2017   26 mm Edwards Sapien 3 transcatheter heart valve placed via percutaneous left transfemoral approach   Past Surgical History:  Procedure Laterality Date   ARTERIAL LINE INSERTION Right 05/09/2017   Procedure: ARTERIAL LINE  INSERTION  RIGHT RADIAL ARTERY;  Surgeon: Sherren Mocha, MD;  Location: [place];  Service: Open Heart Surgery;  Laterality: Right;   CARDIAC CATHETERIZATION  02/16/1999   Left heart catheterization with selective coronary  angiography, left ventricular angiography (RAO and LAO views), angiography of left internal mammary artery, and angiography of saphenous vein grafts   CARDIAC CATHETERIZATION  10/20/2006   Est. EF of 55% -- Totally occuladed native coronary circulation -- Persistent patency of the right mammary graft and the distal LAD artery with peristent patency of the left mammary graft to the proximal LAD artery with persistant patency of the saphenous vein graft to the sequentail branches of the OM         CARPAL TUNNEL RELEASE     CHOLECYSTECTOMY     COLONOSCOPY  07/12/2007   Dr.Brodie- normal cecum and rectum, diverticulosis in the sigmoid colon   COLONOSCOPY  1991   per Dr.Brodie  office visit note= hyperplastic polyp   COLONOSCOPY N/A 09/30/2014   Dr.Rourk- noraml appearing rectal mucosa, scattered L sided diverticula, colonic mucosa has a somewhat pale friable appearence diffusely, no ulcers or erosions seen, the distal 10cm of terminal ileum appeared entirely normal. bx=benign colorectal mucosa with lymphocytic colitis   CORONARY ARTERY BYPASS GRAFT  1985   CORONARY ARTERY BYPASS GRAFT  03/09/1999   Redo CABG x3 --  with the free right internal mammary artey to the LAD with an LAD endarterectomy -- Sequential saphenous vein graft to OM1 and OM2                    CYSTOSCOPY/RETROGRADE/URETEROSCOPY Right 12/21/2017   Procedure: CYSTOSCOPY/RETROGRADE;  Surgeon: Raynelle Bring, MD;  Location: [facility];  Service: Urology;  Laterality: Right;   ESOPHAGOGASTRODUODENOSCOPY N/A 09/30/2014   Dr.Rourk-mild erosive reflux esophagitis s/p bariatric surgery   EYE SURGERY  catracts   GASTRIC BYPASS  2004   INCISION AND DRAINAGE ABSCESS N/A 01/11/2018   Procedure: INCISION AND DRAINAGE ABSCESS;  Surgeon: Raynelle Bring, MD;  Location: [facility];  Service: Urology;  Laterality: N/A;   LAPAROSCOPIC NEPHRECTOMY Right 12/28/2017   Procedure: LAPAROSCOPIC RADICAL NEPHRECTOMY;  Surgeon: Raynelle Bring, MD;  Location: [facility];  Service: Urology;  Laterality: Right;   RIGHT/LEFT HEART CATH AND CORONARY/GRAFT ANGIOGRAPHY N/A 04/04/2017   Procedure: RIGHT/LEFT HEART CATH AND CORONARY/GRAFT ANGIOGRAPHY;  Surgeon: Martinique, Peter M, MD;  Location: [place] [facility];  Service: Cardiovascular;  Laterality: N/A;   TEE WITHOUT CARDIOVERSION N/A 05/09/2017   Procedure: TRANSESOPHAGEAL ECHOCARDIOGRAM (TEE);  Surgeon: Sherren Mocha, MD;  Location: [place];  Service: Open Heart Surgery;  Laterality: N/A;   TRANSCATHETER AORTIC VALVE REPLACEMENT, TRANSFEMORAL N/A 05/09/2017   Procedure: TRANSCATHETER AORTIC VALVE REPLACEMENT, TRANSFEMORAL using a 49mm Edwards Sapien 3 Aortic Valve;  Surgeon: Sherren Mocha, MD;  Location: [place];  Service: Open Heart Surgery;  Laterality: N/A;     No outpatient medications have been marked as taking for the 04/27/18 encounter (Telemedicine) with Eileen Stanford, PA-C.     Allergies:   Chantix [varenicline tartrate]; Dipyridamole; and Varenicline   Social History   Tobacco Use   Smoking status: Current Some Day Smoker    Packs/day: 0.50    Types: Cigarettes    Start date: 01/10/1985    Last attempt to quit: 02/11/2016    Years since quitting: 2.2   Smokeless tobacco: Never Used   Tobacco comment: smokes 1-2 cigarettes a day  Substance Use Topics   Alcohol use: Yes    Alcohol/week: 0.0 standard drinks    Comment: rarely wine   Drug use: No     Family Hx: The patient's family history includes Diabetes in her mother; Pancreatic cancer in her mother; Stroke in her father.  ROS:   Please see the history of present illness.    All other systems reviewed and are negative.   Prior CV studies:   The following studies were reviewed today:  05/09/2017 Transcatheter Aortic Valve Replacement - PercutaneousLeftTransfemoral Approach Edwards Sapien 3 THV (size 58mm, model # 9600TFX, serial # [id number])  Pre-operative Echo Findings: ? Severe aortic stenosis ? Moderate aortic insufficiency ? Normalleft ventricular systolic function  Post-operative Echo Findings: ? Noparavalvular leak ? Normalleft ventricular systolic function   __________________   5/1/192D echocardiogram (postop day #1 study): Left ventricle: Wall thickness was increased in a pattern of mild LVH. Systolic function was normal. The estimated ejection fraction was in the range of 60% to 65%. The transmitral flow pattern was normal. The deceleration time of the early transmitral flow velocity was normal. The pulmonary vein flow pattern was normal. The tissue Doppler parameters were normal. Left ventricular diastolic function parameters were  normal. Doppler parameters are consistent with both elevated ventricular end-diastolic filling pressure and elevated left atrial filling pressure.   __________________  06/01/17 echo (30 days post op) Study Conclusions - Left ventricle: The cavity size was normal. Wall thickness was increased in a pattern of mild LVH. Systolic function was normal. The estimated ejection fraction was in the range of 55% to 60%. Wall motion was normal; there were no regional wall motion abnormalities. Doppler parameters are consistent with abnormal left ventricular relaxation (grade 1 diastolic dysfunction). Doppler parameters are consistent with high ventricular filling pressure. - Aortic valve: A bioprosthesis was present. Valve area (VTI): 1.56 cm^2. - Mitral valve: There was mild regurgitation. - Left  atrium: The atrium was mildly dilated. Impressions: - Normal LV systolic function; mild LVH; mild diastolic dysfunction; s/p AVR with mean gradient 14 mmHg and no AI; mild MR; mild LAE; mild TR.  Labs/Other Tests and Data Reviewed:    EKG:  No ECG reviewed.  Recent Labs: 05/05/2017: B Natriuretic Peptide 492.6 05/10/2017: Magnesium 1.6 01/10/2018: ALT 8 01/13/2018: Hemoglobin 10.1; Platelets 229; TSH 4.968 01/14/2018: BUN 21; Creatinine, Ser 1.30; Potassium 4.2; Sodium 138   Recent Lipid Panel Lab Results  Component Value Date/Time   CHOL 149 02/04/2014 08:31 AM   TRIG 124 02/04/2014 08:31 AM   HDL 60 02/04/2014 08:31 AM   CHOLHDL 2.5 02/04/2014 08:31 AM   LDLCALC 64 02/04/2014 08:31 AM    Wt Readings from Last 3 Encounters:  02/12/18 203 lb 6.4 oz (92.3 kg)  01/14/18 216 lb 7.9 oz (98.2 kg)  12/28/17 209 lb (94.8 kg)     Objective:    Vital Signs:  There were no vitals taken for this visit.   Well nourished, well developed female in no acute distress.    ASSESSMENT & PLAN:    Severe AS s/p TAVR: doing excellent. She is so grateful to Dr. Roxy Manns and Dr. Burt Knack  for the care she received when she got her TAVR. She has NYHA class I symptoms and trying to stay as active as possible during this quarantine. SBE prophylaxis discussed. She needs a refill on her amoxicillin. Her 1 year echo has been moved out until July due to covid 19 pandemic.   AAA: infrarenal abdominal aorta which measures up to 3.4 x 3.0 cmnoted on CT 04/18/2017. CT recommended followup by ultrasound in 3 years. Per Dr. Doug Sou last office note, this will be followed with a yearly CT.   CAD: stable. Continue Asa, statin and bb  HTN: she has not been taking her BP recently. No changes made.   Clear cell RCC: she underwent right laparoscopic radical nephrectomy in 12/2017 by Dr. Alinda Money. THis was c/b sepsis due to large abdominal wall abscess at site of prior nephrectomy in January   COVID-19 Education:  the signs and symptoms of COVID-19 were discussed with the patient and how to seek care for testing (follow up with PCP or arrange E-visit).  The importance of social distancing was discussed today.  Time:   Today, I have spent 25 minutes with the patient with telehealth technology discussing the above problems.     Medication Adjustments/Labs and Tests Ordered: Current medicines are reviewed at length with the patient today.  Concerns regarding medicines are outlined above.   Tests Ordered: No orders of the defined types were placed in this encounter.   Medication Changes: Meds ordered this encounter  Medications   amoxicillin (AMOXIL) 500 MG tablet    Sig: Take 4 tablets (2,000 mg total) by mouth as directed. 1 hour prior to dental work including cleanings    Dispense:  12 tablet    Refill:  12    Order Specific Question:   Supervising Provider    Answer:   Burt Knack, [place]    Disposition:  Follow up prn in structural heart clinic.   Signed, Angelena Form, PA-C  04/27/2018 12:40 PM    [facility]

## 2018-04-27 ENCOUNTER — Other Ambulatory Visit: Payer: Self-pay

## 2018-04-27 ENCOUNTER — Telehealth (INDEPENDENT_AMBULATORY_CARE_PROVIDER_SITE_OTHER): Payer: Medicare Other | Admitting: Physician Assistant

## 2018-04-27 DIAGNOSIS — Z952 Presence of prosthetic heart valve: Secondary | ICD-10-CM | POA: Diagnosis not present

## 2018-04-27 DIAGNOSIS — I714 Abdominal aortic aneurysm, without rupture, unspecified: Secondary | ICD-10-CM

## 2018-04-27 DIAGNOSIS — Z7189 Other specified counseling: Secondary | ICD-10-CM

## 2018-04-27 DIAGNOSIS — C641 Malignant neoplasm of right kidney, except renal pelvis: Secondary | ICD-10-CM

## 2018-04-27 DIAGNOSIS — I10 Essential (primary) hypertension: Secondary | ICD-10-CM

## 2018-04-27 DIAGNOSIS — I25810 Atherosclerosis of coronary artery bypass graft(s) without angina pectoris: Secondary | ICD-10-CM

## 2018-04-27 MED ORDER — AMOXICILLIN 500 MG PO TABS: 2000.0000 mg | ORAL_TABLET | ORAL | 12 refills | Status: DC

## 2018-04-27 NOTE — Patient Instructions (Signed)
Hello [name],   It was so nice to talk to you on the phone today. I am so glad you are doing so well. I just wanted to send you a recap of our discussion.   Please continue taking antibiotics prior to any dental work including cleanings (amoxicillin, which was called [facility] your [facility] today). Otherwise, continue on all same medications.   Your 1 month year has been rescheduled to July.   All appointment details are in my chart upcoming appointments tabs.   Please call us with any questions or concerns you may have and please stay safe during these uncertain times.  Nell Range

## 2018-05-14 ENCOUNTER — Other Ambulatory Visit: Payer: Self-pay | Admitting: Cardiology

## 2018-05-14 DIAGNOSIS — I739 Peripheral vascular disease, unspecified: Secondary | ICD-10-CM

## 2018-06-29 ENCOUNTER — Encounter: Payer: Self-pay | Admitting: Thoracic Surgery (Cardiothoracic Vascular Surgery)

## 2018-07-30 ENCOUNTER — Ambulatory Visit (HOSPITAL_COMMUNITY): Payer: Medicare Other | Attending: Physician Assistant

## 2018-07-30 ENCOUNTER — Other Ambulatory Visit: Payer: Self-pay

## 2018-07-30 DIAGNOSIS — Z952 Presence of prosthetic heart valve: Secondary | ICD-10-CM | POA: Diagnosis present

## 2018-07-30 DIAGNOSIS — I350 Nonrheumatic aortic (valve) stenosis: Secondary | ICD-10-CM | POA: Diagnosis present

## 2018-08-01 ENCOUNTER — Other Ambulatory Visit: Payer: Self-pay | Admitting: Cardiology

## 2018-08-02 ENCOUNTER — Ambulatory Visit (HOSPITAL_BASED_OUTPATIENT_CLINIC_OR_DEPARTMENT_OTHER)
Admission: RE | Admit: 2018-08-02 | Discharge: 2018-08-02 | Disposition: A | Discharge status: Home / Self Care | Payer: Medicare Other | Source: Ambulatory Visit | Attending: Cardiology | Admitting: Cardiology

## 2018-08-02 ENCOUNTER — Other Ambulatory Visit: Payer: Self-pay | Admitting: Cardiology

## 2018-08-02 ENCOUNTER — Other Ambulatory Visit: Payer: Self-pay

## 2018-08-02 ENCOUNTER — Ambulatory Visit (HOSPITAL_COMMUNITY)
Admission: RE | Admit: 2018-08-02 | Discharge: 2018-08-02 | Disposition: A | Discharge status: Home / Self Care | Payer: Medicare Other | Source: Ambulatory Visit | Attending: Cardiology | Admitting: Cardiology

## 2018-08-02 ENCOUNTER — Other Ambulatory Visit (HOSPITAL_COMMUNITY): Payer: Self-pay | Admitting: Cardiology

## 2018-08-02 DIAGNOSIS — I739 Peripheral vascular disease, unspecified: Secondary | ICD-10-CM

## 2018-08-02 DIAGNOSIS — I714 Abdominal aortic aneurysm, without rupture, unspecified: Secondary | ICD-10-CM

## 2018-08-02 DIAGNOSIS — I745 Embolism and thrombosis of iliac artery: Secondary | ICD-10-CM

## 2018-08-07 ENCOUNTER — Other Ambulatory Visit: Payer: Self-pay

## 2018-08-07 DIAGNOSIS — I714 Abdominal aortic aneurysm, without rupture, unspecified: Secondary | ICD-10-CM

## 2018-08-07 DIAGNOSIS — I739 Peripheral vascular disease, unspecified: Secondary | ICD-10-CM

## 2018-08-07 NOTE — Progress Notes (Signed)
VAS 

## 2018-08-08 ENCOUNTER — Telehealth: Payer: Self-pay | Admitting: Cardiology

## 2018-08-08 NOTE — Telephone Encounter (Signed)
LVM for patient to call and schedule appointments for tests in 07-2019.

## 2018-08-14 ENCOUNTER — Ambulatory Visit: Payer: Medicare Other | Admitting: Physician Assistant

## 2018-09-05 ENCOUNTER — Other Ambulatory Visit: Payer: Self-pay | Admitting: Nurse Practitioner

## 2018-09-06 ENCOUNTER — Other Ambulatory Visit: Payer: Self-pay | Admitting: Cardiology

## 2018-09-19 ENCOUNTER — Other Ambulatory Visit: Payer: Self-pay

## 2018-09-19 MED ORDER — LEVOTHYROXINE SODIUM 50 MCG PO TABS: 50.0000 ug | ORAL_TABLET | Freq: Every day | ORAL | 1 refills | Status: DC

## 2018-09-24 ENCOUNTER — Other Ambulatory Visit: Payer: Self-pay

## 2018-09-24 MED ORDER — AMLODIPINE BESYLATE 10 MG PO TABS: 10.0000 mg | ORAL_TABLET | Freq: Every day | ORAL | 1 refills | Status: DC

## 2018-10-19 NOTE — Progress Notes (Signed)
[name] Date of Birth: Sep 19, 1940   History of Present Illness: [name] is seen today for follow up CAD, AS, and PAD. She has known CAD with redo CABG in 2001.  She has a known abdominal aortic aneurysm with last ultrasound  showing a  3.1 cm aneurysm in August 2018. She has a history of PAD with right iliac occlusion. Last ABIs in August 2018 showed stable right ABI of 0.75.  She has chronic claudication symptoms with pain in right thigh with walking for long distances. This has not changed.[name] Her last nuclear stress test in January 2016 showed  anterior wall ischemia. This was unchanged from 2012 and 2013. She had a cardiac cath in 2008 which  showed patent grafts.   She has  lymphocytic colitis. Followed by Dr. Gala Romney. This flared last Spring and she was placed on Entecort. Following this she developed edema and elevated BP. She states she felt dizzy, HA, and "crazy". She was started on Avapro. Symptoms resolved after she came off steroids.   Over the past year or so she has experienced significant progression of symptoms of exertional shortness of breath and fatigue. Follow-up echocardiogram performed March 29, 2017 revealed normal left ventricular systolic function with severe aortic stenosis. Peak velocity across the aortic valve measured 4.1 m/s corresponding to mean transvalvular gradient estimated 39 mmHg. The DVI was reported 0.20 with aortic valve area calculated 0.69 cm. Left ventricular ejection fraction was estimated 55-60%. Cardiac cath performed 04/04/2017 revealed peak to peak gradient of 36 mmHg and a mean gradient of 32 mmHg and severe 3 vessel CAD with continued patency of LIMA to mid LAD, free RIMA to the distal LAD, and sequential SVG to the first and second OM of the Cx.   She underwent successful TAVR with a 26 mm Sapien 3 on 05/09/2017. Post operative echo showed a normally functioning TAVR with no significant PVL; mean gradient 11 mm Hg. She was discharged on ASA and  plavix.   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 She returned in early January with sepsis related to an incisional abdominal wall abscess. This was surgically drained and she was treated with antibiotics. Blood cultures were negative.   On follow up today she is doing well.  She has no dyspnea, chest pain or edema. No palpitations. 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 Hoping to avoid surgery. On Voltaren gel.  She states she got a good report from Dr Alinda Money. No recurrent CA.   Current Outpatient Medications on File Prior to Visit  Medication Sig Dispense Refill  . acetaminophen (TYLENOL) 500 MG tablet Take 1,000 mg by mouth every 6 (six) hours as needed for moderate pain or headache.     . ALPRAZolam (XANAX) 0.25 MG tablet Take 0.25 mg by mouth 3 (three) times daily as needed for anxiety.  0  . amLODipine (NORVASC) 10 MG tablet Take 1 tablet (10 mg total) by mouth daily. 90 tablet 1  . amoxicillin (AMOXIL) 500 MG tablet Take 4 tablets (2,000 mg total) by mouth as directed. 1 hour prior to dental work including cleanings 12 tablet 12  . aspirin EC 81 MG tablet Take 81 mg by mouth at bedtime.     . calcium elemental as carbonate (BARIATRIC TUMS ULTRA) 400 MG chewable tablet Chew 1,000 mg by mouth 3 (three) times daily as needed for heartburn.    [name] FLUoxetine (PROZAC) 40 MG  capsule Take 40 mg by mouth every evening.    . furosemide (LASIX) 20 MG tablet Take 20 mg by mouth daily as needed for edema.    . irbesartan (AVAPRO) 300 MG tablet TAKE 1 TABLET BY MOUTH EVERY DAY. KEEP APPOINTMENT 90 tablet 0  . levothyroxine (SYNTHROID) 50 MCG tablet Take 1 tablet (50 mcg total) by mouth daily before breakfast. 96 tablet 1  . loperamide (IMODIUM) 2 MG capsule Take 2-4 mg by mouth 4 (four) times daily as needed for diarrhea or loose stools.     . Nebivolol HCl (BYSTOLIC) 20 MG TABS Take 1 tablet (20 mg total) by mouth daily. 30 tablet  11  . nitroGLYCERIN (NITROSTAT) 0.4 MG SL tablet Place 0.4 mg under the tongue every 5 (five) minutes as needed for chest pain (MAX 3 TABLETS).    . pantoprazole (PROTONIX) 40 MG tablet TAKE 1 TABLET BY MOUTH EVERY DAY 90 tablet 1  . PROAIR HFA 108 (90 Base) MCG/ACT inhaler Inhale 2 puffs into the lungs every 4 (four) hours as needed for shortness of breath or wheezing.  2  . simethicone (MYLICON) 0000000 MG chewable tablet Chew 125 mg by mouth every 6 (six) hours as needed for flatulence.    . simvastatin (ZOCOR) 40 MG tablet Take 1 tablet (40 mg total) by mouth at bedtime. 90 tablet 3  . SYMBICORT 160-4.5 MCG/ACT inhaler Inhale 1 puff into the lungs 2 (two) times daily as needed (for respiratory issues.).   2   No current facility-administered medications on file prior to visit.     Allergies  Allergen Reactions  . Chantix [Varenicline Tartrate] Hives  . Dipyridamole Hives  . Varenicline Hives    Past Medical History:  Diagnosis Date  . Abdominal aortic aneurysm ([place])    infrarenal diagnosed with maximum measurement at 3.3 cm  . Coronary artery disease   . Depression   . Diverticulosis   . GERD (gastroesophageal reflux disease)   . History of tobacco abuse    Recurrent cigarette smoking  . Hyperlipidemia   . Hyperplastic colon polyp   . Hypertension   . Hypothyroidism   . Ischemic heart disease    had redo bypass surgery in 2001, at that time she had a free right internal mammary to the LAD, LAD endarterectomy, sequential vein graft to the OM1 and OM2 -- last catheterization was in 2008  . Leg fracture    remote right leg fracture  . Microscopic colitis   . Obesity    with gastric bypass surgery in 2004  . PAD (peripheral artery disease) ([facility])    right iliac occlusion  . PONV (postoperative nausea and vomiting)   . Right kidney mass   . S/P CABG x 4 1985   LIMA to LAD, Sequential SVG to OM1-OM2, SVG to RCA - Dr Redmond Pulling  . S/P redo CABG x 3 2001   Free RIMA to LAD with  endarterectomy, sequential SVG to OM1-OM2 - Dr Servando Snare  . 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 ARTERIAL LINE INSERTION Right 05/09/2017   Procedure: ARTERIAL LINE  INSERTION  RIGHT RADIAL ARTERY;  Surgeon: Sherren Mocha, MD;  Location: [place];  Service: Open Heart Surgery;  Laterality: Right;  . CARDIAC CATHETERIZATION  02/16/1999   Left heart catheterization with selective coronary  angiography, left ventricular angiography (  RAO and LAO views), angiography of left internal mammary artery, and angiography of saphenous vein grafts  . CARDIAC CATHETERIZATION  10/20/2006   Est. EF of 55% -- Totally occuladed native coronary circulation -- Persistent patency of the right mammary graft and the distal LAD artery with peristent patency of the left mammary graft to the proximal LAD artery with persistant patency of the saphenous vein graft to the sequentail branches of the OM        . CARPAL TUNNEL RELEASE    . CHOLECYSTECTOMY    . COLONOSCOPY  07/12/2007   Dr.Brodie- normal cecum and rectum, diverticulosis in the sigmoid colon  . COLONOSCOPY  1991   per Dr.Brodie office visit note= hyperplastic polyp  . COLONOSCOPY N/A 09/30/2014   Dr.Rourk- noraml appearing rectal mucosa, scattered L sided diverticula, colonic mucosa has a somewhat pale friable appearence diffusely, no ulcers or erosions seen, the distal 10cm of terminal ileum appeared entirely normal. bx=benign colorectal mucosa with lymphocytic colitis  . CORONARY ARTERY BYPASS GRAFT  1985  . CORONARY ARTERY BYPASS GRAFT  03/09/1999   Redo CABG x3 --  with the free right internal mammary artey to the LAD with an LAD endarterectomy -- Sequential saphenous vein graft to OM1 and OM2                   . CYSTOSCOPY/RETROGRADE/URETEROSCOPY Right 12/21/2017   Procedure:  [id number];  Surgeon: Raynelle Bring, MD;  Location: [facility];  Service: Urology;  Laterality: Right;  . ESOPHAGOGASTRODUODENOSCOPY N/A 09/30/2014   Dr.Rourk-mild erosive reflux esophagitis s/p bariatric surgery  . EYE SURGERY     catracts  . GASTRIC BYPASS  2004  . INCISION AND DRAINAGE ABSCESS N/A 01/11/2018   Procedure: INCISION AND DRAINAGE ABSCESS;  Surgeon: Raynelle Bring, MD;  Location: [facility];  Service: Urology;  Laterality: N/A;  . LAPAROSCOPIC NEPHRECTOMY Right 12/28/2017   Procedure: LAPAROSCOPIC RADICAL NEPHRECTOMY;  Surgeon: Raynelle Bring, MD;  Location: [facility];  Service: Urology;  Laterality: Right;  . RIGHT/LEFT HEART CATH AND CORONARY/GRAFT ANGIOGRAPHY N/A 04/04/2017   Procedure: RIGHT/LEFT HEART CATH AND CORONARY/GRAFT ANGIOGRAPHY;  Surgeon: Martinique, Peter M, MD;  Location: [facility];  Service: Cardiovascular;  Laterality: N/A;  . TEE WITHOUT CARDIOVERSION N/A 05/09/2017   Procedure: TRANSESOPHAGEAL ECHOCARDIOGRAM (TEE);  Surgeon: Sherren Mocha, MD;  Location: [facility];  Service: Open Heart Surgery;  Laterality: N/A;  . TRANSCATHETER AORTIC VALVE REPLACEMENT, TRANSFEMORAL N/A 05/09/2017   Procedure: TRANSCATHETER AORTIC VALVE REPLACEMENT, TRANSFEMORAL using a 29mm Edwards Sapien 3 Aortic Valve;  Surgeon: Sherren Mocha, MD;  Location: [facility];  Service: Open Heart Surgery;  Laterality: N/A;    Social History   Tobacco Use  Smoking Status Current Some Day Smoker  . Packs/day: 0.50  . Types: Cigarettes  . Start date: 01/10/1985  . Last attempt to quit: 02/11/2016  . Years since quitting: 2.6  Smokeless Tobacco Never Used  Tobacco Comment   smokes 1-2 cigarettes a day    Social History   Substance and Sexual Activity  Alcohol Use Yes  . Alcohol/week: 0.0 standard drinks   Comment: rarely wine    Family History  Problem Relation Age of Onset  . Stroke Father   . Diabetes Mother   . Pancreatic cancer Mother     Review of Systems: The review of systems  is as above.  All other systems were reviewed and are negative.  Physical Exam: BP 140/74 (BP Location: Right Arm, Patient Position: Sitting, Cuff Size: Normal)  Pulse 65   Temp (!) 97.2 F (36.2 C)   Ht 5\' 7"  (1.702 m)   Wt 209 lb (94.8 kg)   BMI 32.73 kg/m  GENERAL:  Obese WF in NAD HEENT:  PERRL, EOMI, sclera are clear. Oropharynx is clear. NECK:  No jugular venous distention, carotid upstroke brisk and symmetric, bilateral  bruits, no thyromegaly or adenopathy LUNGS:  Clear to auscultation bilaterally CHEST:  Unremarkable HEART:  RRR,  PMI not displaced or sustained,S1 and S2 within normal limits, no S3, no S4: no clicks, no rubs, slight  systolic murmur RUSB ABD:  Soft, nontender. BS + EXT: pulses in feet not palpable, feet warm and dry. no edema, no cyanosis no clubbing SKIN:  Warm and dry.  No rashes NEURO:  Alert and oriented x 3. Cranial nerves II through XII intact. PSYCH:  Cognitively intact     LABORATORY DATA: Lab Results  Component Value Date   CHOL 149 02/04/2014   HDL 60 02/04/2014   LDLCALC 64 02/04/2014   TRIG 124 02/04/2014   CHOLHDL 2.5 02/04/2014   Lab Results  Component Value Date   WBC 9.4 01/13/2018   HGB 10.1 (L) 01/13/2018   HCT 31.6 (L) 01/13/2018   PLT 229 01/13/2018   GLUCOSE 97 01/14/2018   CHOL 149 02/04/2014   TRIG 124 02/04/2014   HDL 60 02/04/2014   LDLCALC 64 02/04/2014   ALT 8 01/10/2018   AST 14 (L) 01/10/2018   NA 138 01/14/2018   K 4.2 01/14/2018   CL 110 01/14/2018   CREATININE 1.30 (H) 01/14/2018   BUN 21 01/14/2018   CO2 20 (L) 01/14/2018   TSH 4.968 (H) 01/13/2018   INR 1.01 01/10/2018   HGBA1C 5.8 (H) 05/05/2017   Labs dated 10/05/15: glucose 108. Otherwise CMET is normal. Cholesterol 130, triglycerides 114, HDL 63, LDL 44. TSH normal.  Dated 11/02/16: cholesterol 138, triglycerides 78, LDL 49, HDL 73. CMET and CBC normal. Dated 09/14/18: normal CBC. Creatinine 1.49. other chemistries normal. Cholesterol 137,  triglycerides 114, HDL 57, LDL 60.  Dated 09/20/18: creatinine 1.02.   Ecg today shows NSR with PACs. Old septal infarct. I have personally reviewed and interpreted this study.   Echo 04/19/16: Study Conclusions  - Left ventricle: The cavity size was normal. Wall thickness was   normal. Systolic function was normal. The estimated ejection   fraction was in the range of 60% to 65%. Wall motion was normal;   there were no regional wall motion abnormalities. Doppler   parameters are consistent with pseudonormal left ventricular   relaxation (grade 2 diastolic dysfunction). The E/e&' ratio is   >15, suggesting elevated LV filling pressure. - Aortic valve: Calcified leaflets. Moderate stenosis. Mean   gradient (S): 35 mm Hg. Peak gradient (S): 61 mm Hg. Valve area   (VTI): 0.98 cm^2. Valve area (Vmax): 1.05 cm^2. Valve area   (Vmean): 0.98 cm^2. - Mitral valve: Mildly thickened leaflets . There was mild   regurgitation. - Left atrium: The atrium was moderately dilated. - Right ventricle: The cavity size was mildly dilated. Systolic   function is mildly reduced. - Tricuspid valve: There was mild regurgitation. - Pulmonary arteries: PA peak pressure: 34 mm Hg (S). - Inferior vena cava: The vessel was normal in size. The   respirophasic diameter changes were in the normal range (>= 50%),   consistent with normal central venous pressure.  Impressions:  - Compared to a prior study in 2012, there is now moderate aortic  stenosis - AVA around 1.05 cm2 with mean gradient of 35 mmHg.  Echo 03/29/17: Study Conclusions  - Left ventricle: The cavity size was normal. Wall thickness was   increased in a pattern of mild LVH. Systolic function was normal.   The estimated ejection fraction was in the range of 55% to 60%.   Wall motion was normal; there were no regional wall motion   abnormalities. Doppler parameters are consistent with abnormal   left ventricular relaxation (grade 1 diastolic  dysfunction). - Aortic valve: Poorly visualized. Probably trileaflet; severely   calcified leaflets. There was severe stenosis. There was mild   regurgitation. Mean gradient (S): 41 mm Hg. Peak gradient (S): 67   mm Hg. Valve area (VTI): 0.69 cm^2. - Mitral valve: Mildly calcified annulus. Mildly calcified leaflets   . There was trivial regurgitation. - Left atrium: The atrium was moderately dilated. - Right ventricle: The cavity size was normal. Systolic function   was mildly reduced. - Tricuspid valve: Peak RV-RA gradient (S): 32 mm Hg. - Pulmonary arteries: PA peak pressure: 35 mm Hg (S). - Inferior vena cava: The vessel was normal in size. The   respirophasic diameter changes were in the normal range (>= 50%),   consistent with normal central venous pressure.  Impressions:  - Normal LV size with mild LV hypertrophy. EF 55-60%. Severe aortic   stenosis, mild aortic insufficiency. Moderate left atrial   enlargement. Normal RV size with mildly decreased systolic   function.  Cardiac cath 04/04/17:  RIGHT/LEFT HEART CATH AND CORONARY/GRAFT ANGIOGRAPHY  Conclusion     Ost LAD to Prox LAD lesion is 100% stenosed.  Prox LAD to Mid LAD lesion is 100% stenosed.  Ost Cx to Mid Cx lesion is 100% stenosed.  Prox RCA to Mid RCA lesion is 100% stenosed.  LIMA graft was visualized by angiography and is normal in caliber.  The graft exhibits no disease.  Seq SVG- OM 1 and OM 2 graft was visualized by angiography and is large.  SVG graft was visualized by angiography and is normal in caliber.  The graft exhibits no disease.  Dist Graft lesion is 30% stenosed.  Origin lesion is 35% stenosed.  There is severe aortic valve stenosis.  LV end diastolic pressure is mildly elevated.  Hemodynamic findings consistent with mild pulmonary hypertension.   1. Severe 3 vessel occlusive CAD 2. Patent LIMA to the mid LAD. This only supplies a septal perforator.  3. Patent SVG to mid  to distal LAD 4. Patent sequential SVG to OM 1 and OM 2 5. Severe Aortic stenosis. Mean gradient 32 mm Hg, peak gradient 35 mm Hg. Valve area 1.1 cm squared. Index 0.56 6. Normal cardiac output 7. Mild pulmonary HTN with mildly elevated LV filling pressures.   Plan: Coronary circulation is unchanged from 2008. Patient has progressive symptomatic aortic stenosis. Will refer for TAVR evaluation.    Echo 06/01/17: Study Conclusions  - Left ventricle: The cavity size was normal. Wall thickness was   increased in a pattern of mild LVH. Systolic function was normal.   The estimated ejection fraction was in the range of 55% to 60%.   Wall motion was normal; there were no regional wall motion   abnormalities. Doppler parameters are consistent with abnormal   left ventricular relaxation (grade 1 diastolic dysfunction).   Doppler parameters are consistent with high ventricular filling   pressure. - Aortic valve: A bioprosthesis was present. Valve area (VTI): 1.56   cm^2. - Mitral  valve: There was mild regurgitation. - Left atrium: The atrium was mildly dilated.  Impressions:  - Normal LV systolic function; mild LVH; mild diastolic   dysfunction; s/p AVR with mean gradient 14 mmHg and no AI; mild   MR; mild LAE; mild TR.  Assessment / Plan: 1. Coronary disease status post CABG. Redo coronary bypass surgery in 2001 with sequential saphenous vein graft to the first and second obtuse marginal vessels and a free RIMA graft to the LAD. Prior LIMA graft to the proximal LAD. Myoview study January 2016 was stable showing anterior ischemia similar to prior studies.  Cardiac cath in March 2019 showed patent grafts. No  angina. Continue aspirin, beta blocker, and statin therapy.   2. Severe Aortic stenosis s/p TAVR in April 2019. . Follow up Echo satisfactory. Routine  SBE prophylaxis.   3. PAD with right iliac occlusion. Claudication more noticeable since she is more active.  Dopplers in August  2018 showed stable ABI of 0.75 on the right and normal on the left. Abd/pelvic CT in April 2019 was stable. Repeat dopplers in July 2020 showed no change  4. Abdominal aortic aneurysm measuring 3.4 x 3.0 cm by CT. Abd [place] in July 2020 measured 2.3 cm.   4. Hypertension- well controlled.  Continue  meds.   6. Tobacco abuse-encouraged complete cessation. Smoking only occasionally.   7. Obesity.  8. Lymphocytic colitis. No active symptoms  9. Carotid arterial disease- no significant stenosis.   10. 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 S/p I/D of abdominal wall abscess Jan 2020.    I will follow up in 6 months.

## 2018-10-22 ENCOUNTER — Encounter: Payer: Self-pay | Admitting: Cardiology

## 2018-10-22 ENCOUNTER — Ambulatory Visit: Payer: Medicare Other | Admitting: Cardiology

## 2018-10-22 ENCOUNTER — Other Ambulatory Visit: Payer: Self-pay

## 2018-10-22 VITALS — BP 140/74 | HR 65 | Temp 97.2°F | Ht 67.0 in | Wt 209.0 lb

## 2018-10-22 DIAGNOSIS — I10 Essential (primary) hypertension: Secondary | ICD-10-CM | POA: Diagnosis not present

## 2018-10-22 DIAGNOSIS — I739 Peripheral vascular disease, unspecified: Secondary | ICD-10-CM

## 2018-10-22 DIAGNOSIS — I714 Abdominal aortic aneurysm, without rupture, unspecified: Secondary | ICD-10-CM

## 2018-10-22 DIAGNOSIS — I25810 Atherosclerosis of coronary artery bypass graft(s) without angina pectoris: Secondary | ICD-10-CM

## 2018-10-22 DIAGNOSIS — Z952 Presence of prosthetic heart valve: Secondary | ICD-10-CM | POA: Diagnosis not present

## 2018-12-22 ENCOUNTER — Other Ambulatory Visit: Payer: Self-pay | Admitting: Cardiology

## 2018-12-24 ENCOUNTER — Other Ambulatory Visit: Payer: Self-pay | Admitting: Cardiology

## 2019-02-20 ENCOUNTER — Other Ambulatory Visit: Payer: Self-pay | Admitting: Nurse Practitioner

## 2019-02-20 NOTE — Telephone Encounter (Signed)
[name], please arrange office visit for further refills. I have refilled for now.

## 2019-02-21 ENCOUNTER — Encounter: Payer: Self-pay | Admitting: Gastroenterology

## 2019-02-21 NOTE — Telephone Encounter (Signed)
PATIENT SCHEDULED AND LETTER SENT  °

## 2019-02-22 ENCOUNTER — Other Ambulatory Visit: Payer: Self-pay

## 2019-02-22 MED ORDER — BYSTOLIC 20 MG PO TABS: 1.0000 | ORAL_TABLET | Freq: Every day | ORAL | 11 refills | Status: AC

## 2019-03-18 ENCOUNTER — Other Ambulatory Visit: Payer: Self-pay

## 2019-03-18 MED ORDER — SIMVASTATIN 40 MG PO TABS: 40.0000 mg | ORAL_TABLET | Freq: Every day | ORAL | 3 refills | Status: DC

## 2019-03-22 ENCOUNTER — Other Ambulatory Visit: Payer: Self-pay

## 2019-03-22 MED ORDER — AMLODIPINE BESYLATE 10 MG PO TABS: 10.0000 mg | ORAL_TABLET | Freq: Every day | ORAL | 1 refills | Status: DC

## 2019-03-22 NOTE — Telephone Encounter (Signed)
Spoke with pt and let her know her rx for Amlodipine has been sent to [facility].

## 2019-03-28 ENCOUNTER — Ambulatory Visit: Payer: Medicare Other | Admitting: Gastroenterology

## 2019-04-02 ENCOUNTER — Telehealth: Payer: Self-pay | Admitting: Oncology

## 2019-04-02 NOTE — Telephone Encounter (Signed)
Received a new patient referral from Dr. 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 Pt returned my call and has been scheduled to see Dr. Alen Blew on 4/1 at 2pm. Pt aware to arrive 15 minutes early.

## 2019-04-11 ENCOUNTER — Inpatient Hospital Stay: Payer: Medicare Other | Attending: Oncology | Admitting: Oncology

## 2019-04-11 ENCOUNTER — Other Ambulatory Visit: Payer: Self-pay

## 2019-04-11 VITALS — BP 151/60 | HR 65 | Temp 98.7°F | Resp 18 | Ht 67.0 in | Wt 214.1 lb

## 2019-04-11 DIAGNOSIS — I2510 Atherosclerotic heart disease of native coronary artery without angina pectoris: Secondary | ICD-10-CM | POA: Diagnosis not present

## 2019-04-11 DIAGNOSIS — C649 Malignant neoplasm of unspecified kidney, except renal pelvis: Secondary | ICD-10-CM | POA: Insufficient documentation

## 2019-04-11 DIAGNOSIS — Z79899 Other long term (current) drug therapy: Secondary | ICD-10-CM | POA: Insufficient documentation

## 2019-04-11 DIAGNOSIS — C7800 Secondary malignant neoplasm of unspecified lung: Secondary | ICD-10-CM | POA: Insufficient documentation

## 2019-04-11 DIAGNOSIS — Z7189 Other specified counseling: Secondary | ICD-10-CM | POA: Insufficient documentation

## 2019-04-11 DIAGNOSIS — R591 Generalized enlarged lymph nodes: Secondary | ICD-10-CM | POA: Diagnosis not present

## 2019-04-11 DIAGNOSIS — R918 Other nonspecific abnormal finding of lung field: Secondary | ICD-10-CM

## 2019-04-11 DIAGNOSIS — I10 Essential (primary) hypertension: Secondary | ICD-10-CM

## 2019-04-11 DIAGNOSIS — Z515 Encounter for palliative care: Secondary | ICD-10-CM | POA: Insufficient documentation

## 2019-04-11 DIAGNOSIS — Z5112 Encounter for antineoplastic immunotherapy: Secondary | ICD-10-CM | POA: Insufficient documentation

## 2019-04-11 DIAGNOSIS — F17210 Nicotine dependence, cigarettes, uncomplicated: Secondary | ICD-10-CM

## 2019-04-11 DIAGNOSIS — E039 Hypothyroidism, unspecified: Secondary | ICD-10-CM

## 2019-04-11 MED ORDER — AXITINIB 5 MG PO TABS: 5.0000 mg | ORAL_TABLET | Freq: Every day | ORAL | 0 refills | Status: DC

## 2019-04-11 MED ORDER — PROCHLORPERAZINE MALEATE 10 MG PO TABS: 10.0000 mg | ORAL_TABLET | Freq: Four times a day (QID) | ORAL | 0 refills | Status: DC | PRN

## 2019-04-11 NOTE — Progress Notes (Signed)
START ON PATHWAY REGIMEN - Renal Cell     A cycle is every 21 days:     Axitinib      Pembrolizumab   **Always confirm dose/schedule in your pharmacy ordering system**  Patient Characteristics: Stage IV/Metastatic Disease, Clear Cell, First Line, Intermediate or Poor Risk Therapeutic Status: Stage IV/Metastatic Disease Histology: Clear Cell Line of Therapy: First Line Risk Status: Intermediate Risk Intent of Therapy: Non-Curative / Palliative Intent, Discussed with Patient 

## 2019-04-11 NOTE — Progress Notes (Signed)
Reason for the request:    Kidney cancer  HPI: I was asked by Dr. Lilia Pro to evaluate [name] for diagnosis of kidney cancer.  [name] is a 79 year old woman with history of hypertension, coronary disease and hyperlipidemia.  [name] was found to have right renal mass on routine evaluation with CT angiogram.  [name] was found to have 4.1 cm mass enhancing centrally.  [name] subsequently underwent right laparoscopic radical nephrectomy completed by Dr. Alinda Money on December 28, 2017.  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  The final pathological stage was T1b.  [name] remained on active surveillance at this time with CT scan obtained on September 2020 showed the prior nephrectomy without any evidence of relapsed disease.  CT scan obtained in March 2021 showed an increase in her chest adenopathy including an left paratracheal lymph node measuring 2.2 cm, left anterior mediastinal lymph node measuring 2.2 cm.  A left hilar lymph node measuring 2.5 cm.  A subpleural nodule in the left lower lobe measuring 3.2 x 2.3 cm previously was 0.5.  Subpleural nodule within the lingula measuring 1.1 cm previously was around the same.  Clinically, [name] reports no complaints or symptoms related to these findings.  [name] denies any shortness of breath or difficulty breathing or hemoptysis.  [name] does ambulate with the help of walker without any falls or syncope.  Her performance status is reasonable and continues to attend activities of daily living.  [name] does not report any headaches, blurry vision, syncope or seizures. Does not report any fevers, chills or sweats.  Does not report any cough, wheezing or hemoptysis.  Does not report any chest pain, palpitation, orthopnea or leg edema.  Does not report any nausea, vomiting or abdominal pain.  Does not report any constipation or diarrhea.  Does not report any skeletal complaints.    Does not report frequency, urgency or  hematuria.  Does not report any skin rashes or lesions. Does not report any heat or cold intolerance.  Does not report any lymphadenopathy or petechiae.  Does not report any anxiety or depression.  Remaining review of systems is negative.    Past Medical History:  Diagnosis Date  . Abdominal aortic aneurysm ([place])    infrarenal diagnosed with maximum measurement at 3.3 cm  . Coronary artery disease   . Depression   . Diverticulosis   . GERD (gastroesophageal reflux disease)   . History of tobacco abuse    Recurrent cigarette smoking  . Hyperlipidemia   . Hyperplastic colon polyp   . Hypertension   . Hypothyroidism   . Ischemic heart disease    had redo bypass surgery in 2001, at that time [name] had a free right internal mammary to the LAD, LAD endarterectomy, sequential vein graft to the OM1 and OM2 -- last catheterization was in 2008  . Leg fracture    remote right leg fracture  . Microscopic colitis   . Obesity    with gastric bypass surgery in 2004  . PAD (peripheral artery disease) ([facility])    right iliac occlusion  . PONV (postoperative nausea and vomiting)   . Right kidney mass   . S/P CABG x 4 1985   LIMA to LAD, Sequential SVG to OM1-OM2, SVG to RCA - Dr Redmond Pulling  . S/P redo CABG x 3 2001   Free RIMA to LAD with endarterectomy, sequential SVG to OM1-OM2 - Dr Servando Snare  . 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 ARTERIAL LINE INSERTION Right 05/09/2017   Procedure: ARTERIAL LINE  INSERTION  RIGHT RADIAL ARTERY;  Surgeon: Sherren Mocha, MD;  Location: [facility];  Service: Open Heart Surgery;  Laterality: Right;  . CARDIAC CATHETERIZATION  02/16/1999   Left heart catheterization with selective coronary  angiography, left ventricular angiography (RAO and LAO views), angiography of left internal mammary  artery, and angiography of saphenous vein grafts  . CARDIAC CATHETERIZATION  10/20/2006   Est. EF of 55% -- Totally occuladed native coronary circulation -- Persistent patency of the right mammary graft and the distal LAD artery with peristent patency of the left mammary graft to the proximal LAD artery with persistant patency of the saphenous vein graft to the sequentail branches of the OM        . CARPAL TUNNEL RELEASE    . CHOLECYSTECTOMY    . COLONOSCOPY  07/12/2007   Dr.Brodie- normal cecum and rectum, diverticulosis in the sigmoid colon  . COLONOSCOPY  1991   per Dr.Brodie office visit note= hyperplastic polyp  . COLONOSCOPY N/A 09/30/2014   Dr.Rourk- noraml appearing rectal mucosa, scattered L sided diverticula, colonic mucosa has a somewhat pale friable appearence diffusely, no ulcers or erosions seen, the distal 10cm of terminal ileum appeared entirely normal. bx=benign colorectal mucosa with lymphocytic colitis  . CORONARY ARTERY BYPASS GRAFT  1985  . CORONARY ARTERY BYPASS GRAFT  03/09/1999   Redo CABG x3 --  with the free right internal mammary artey to the LAD with an LAD endarterectomy -- Sequential saphenous vein graft to OM1 and OM2                   . CYSTOSCOPY/RETROGRADE/URETEROSCOPY Right 12/21/2017   Procedure: [id number];  Surgeon: Raynelle Bring, MD;  Location: [facility];  Service: Urology;  Laterality: Right;  . ESOPHAGOGASTRODUODENOSCOPY N/A 09/30/2014   Dr.Rourk-mild erosive reflux esophagitis s/p bariatric surgery  . EYE SURGERY     catracts  . GASTRIC BYPASS  2004  . INCISION AND DRAINAGE ABSCESS N/A 01/11/2018   Procedure: INCISION AND DRAINAGE ABSCESS;  Surgeon: Raynelle Bring, MD;  Location: [facility];  Service: Urology;  Laterality: N/A;  . LAPAROSCOPIC NEPHRECTOMY Right 12/28/2017   Procedure: LAPAROSCOPIC RADICAL NEPHRECTOMY;  Surgeon: Raynelle Bring, MD;  Location: [facility];  Service: Urology;  Laterality: Right;  . RIGHT/LEFT HEART CATH AND CORONARY/GRAFT  ANGIOGRAPHY N/A 04/04/2017   Procedure: RIGHT/LEFT HEART CATH AND CORONARY/GRAFT ANGIOGRAPHY;  Surgeon: Martinique, Peter M, MD;  Location: [facility];  Service: Cardiovascular;  Laterality: N/A;  . TEE WITHOUT CARDIOVERSION N/A 05/09/2017   Procedure: TRANSESOPHAGEAL ECHOCARDIOGRAM (TEE);  Surgeon: Sherren Mocha, MD;  Location: [facility];  Service: Open Heart Surgery;  Laterality: N/A;  . TRANSCATHETER AORTIC VALVE REPLACEMENT, TRANSFEMORAL N/A 05/09/2017   Procedure: TRANSCATHETER AORTIC VALVE REPLACEMENT, TRANSFEMORAL using a 4m Edwards Sapien 3 Aortic Valve;  Surgeon: CSherren Mocha MD;  Location: [facility]  Service: Open Heart Surgery;  Laterality: N/A;  :   Current Outpatient Medications:  .  acetaminophen (TYLENOL) 500 MG tablet, Take 1,000 mg by mouth every 6 (six) hours as needed for moderate pain or headache. , Disp: , Rfl:  .  ALPRAZolam (XANAX) 0.25 MG tablet, Take 0.25 mg by mouth 3 (three) times daily as needed for anxiety., Disp: , Rfl: 0 .  amLODipine (NORVASC)  10 MG tablet, Take 1 tablet (10 mg total) by mouth daily., Disp: 90 tablet, Rfl: 1 .  amoxicillin (AMOXIL) 500 MG tablet, Take 4 tablets (2,000 mg total) by mouth as directed. 1 hour prior to dental work including cleanings, Disp: 12 tablet, Rfl: 12 .  aspirin EC 81 MG tablet, Take 81 mg by mouth at bedtime. , Disp: , Rfl:  .  calcium elemental as carbonate (BARIATRIC TUMS ULTRA) 400 MG chewable tablet, Chew 1,000 mg by mouth 3 (three) times daily as needed for heartburn., Disp: , Rfl:  .  FLUoxetine (PROZAC) 40 MG capsule, Take 40 mg by mouth every evening., Disp: , Rfl:  .  furosemide (LASIX) 20 MG tablet, Take 20 mg by mouth daily as needed for edema., Disp: , Rfl:  .  irbesartan (AVAPRO) 300 MG tablet, Take 1 tablet (300 mg total) by mouth daily., Disp: 90 tablet, Rfl: 2 .  levothyroxine (SYNTHROID) 50 MCG tablet, Take 1 tablet (50 mcg total) by mouth daily before breakfast., Disp: 96 tablet, Rfl: 1 .  loperamide  (IMODIUM) 2 MG capsule, Take 2-4 mg by mouth 4 (four) times daily as needed for diarrhea or loose stools. , Disp: , Rfl:  .  Nebivolol HCl (BYSTOLIC) 20 MG TABS, Take 1 tablet (20 mg total) by mouth daily., Disp: 30 tablet, Rfl: 11 .  nitroGLYCERIN (NITROSTAT) 0.4 MG SL tablet, Place 0.4 mg under the tongue every 5 (five) minutes as needed for chest pain (MAX 3 TABLETS)., Disp: , Rfl:  .  pantoprazole (PROTONIX) 40 MG tablet, TAKE 1 TABLET BY MOUTH EVERY DAY, Disp: 90 tablet, Rfl: 1 .  PROAIR HFA 108 (90 Base) MCG/ACT inhaler, Inhale 2 puffs into the lungs every 4 (four) hours as needed for shortness of breath or wheezing., Disp: , Rfl: 2 .  simethicone (MYLICON) 010 MG chewable tablet, Chew 125 mg by mouth every 6 (six) hours as needed for flatulence., Disp: , Rfl:  .  simvastatin (ZOCOR) 40 MG tablet, Take 1 tablet (40 mg total) by mouth at bedtime., Disp: 90 tablet, Rfl: 3 .  SYMBICORT 160-4.5 MCG/ACT inhaler, Inhale 1 puff into the lungs 2 (two) times daily as needed (for respiratory issues.). , Disp: , Rfl: 2:  Allergies  Allergen Reactions  . Chantix [Varenicline Tartrate] Hives  . Dipyridamole Hives  . Varenicline Hives  :  Family History  Problem Relation Age of Onset  . Stroke Father   . Diabetes Mother   . Pancreatic cancer Mother   :  Social History   Socioeconomic History  . Marital status: Married    Spouse name: Not on file  . Number of children: Not on file  . Years of education: Not on file  . Highest education level: Not on file  Occupational History  . Occupation: reitred    Fish farm manager: [employer]  Tobacco Use  . Smoking status: Current Some Day Smoker    Packs/day: 0.50    Types: Cigarettes    Start date: 01/10/1985    Last attempt to quit: 02/11/2016    Years since quitting: 3.1  . Smokeless tobacco: Never Used  . Tobacco comment: smokes 1-2 cigarettes a day  Substance and Sexual Activity  . Alcohol use: Yes    Alcohol/week: 0.0 standard drinks     Comment: rarely wine  . Drug use: No  . Sexual activity: Never  Other Topics Concern  . Not on file  Social History Narrative  . Not on file  Social Determinants of Health   Financial Resource Strain:   . Difficulty of Paying Living Expenses:   Food Insecurity:   . Worried About Charity fundraiser in the Last Year:   . Arboriculturist in the Last Year:   Transportation Needs:   . Film/video editor (Medical):   [name] Lack of Transportation (Non-Medical):   Physical Activity:   . Days of Exercise per Week:   . Minutes of Exercise per Session:   Stress:   . Feeling of Stress :   Social Connections:   . Frequency of Communication with Friends and Family:   . Frequency of Social Gatherings with Friends and Family:   . Attends Religious Services:   . Active Member of Clubs or Organizations:   . Attends Archivist Meetings:   [name] Marital Status:   Intimate Partner Violence:   . Fear of Current or Ex-Partner:   . Emotionally Abused:   [name] Physically Abused:   . Sexually Abused:   :  Pertinent items are noted in HPI.  Exam:  Blood pressure (!) 151/60, pulse 65, temperature 98.7 F (37.1 C), temperature source Temporal, resp. rate 18, height 5' 7"  (1.702 m), weight 214 lb 1.6 oz (97.1 kg), SpO2 97 %.   ECOG 1  General appearance: alert and cooperative appeared without distress. Head: atraumatic without any abnormalities. Eyes: conjunctivae/corneas clear. PERRL.  Sclera anicteric. Throat: lips, mucosa, and tongue normal; without oral thrush or ulcers. Resp: clear to auscultation bilaterally without rhonchi, wheezes or dullness to percussion. Cardio: regular rate and rhythm, S1, S2 normal, no murmur, click, rub or gallop GI: soft, non-tender; bowel sounds normal; no masses,  no organomegaly Skin: Skin color, texture, turgor normal. No rashes or lesions Lymph nodes: Cervical, supraclavicular, and axillary nodes normal. Neurologic: Grossly normal without any  motor, sensory or deep tendon reflexes. Musculoskeletal: No joint deformity or effusion.  CBC    Component Value Date/Time   WBC 9.4 01/13/2018 0623   RBC 3.18 (L) 01/13/2018 0623   HGB 10.1 (L) 01/13/2018 0623   HGB 13.4 03/31/2017 1139   HCT 31.6 (L) 01/13/2018 0623   HCT 40.5 03/31/2017 1139   PLT 229 01/13/2018 0623   PLT 212 03/31/2017 1139   MCV 99.4 01/13/2018 0623   MCV 97 03/31/2017 1139   MCH 31.8 01/13/2018 0623   MCHC 32.0 01/13/2018 0623   RDW 13.0 01/13/2018 0623   RDW 15.1 03/31/2017 1139   LYMPHSABS 1.4 01/13/2018 0623   LYMPHSABS 2.0 03/31/2017 1139   MONOABS 0.7 01/13/2018 0623   EOSABS 0.0 01/13/2018 0623   EOSABS 0.1 03/31/2017 1139   BASOSABS 0.0 01/13/2018 0623   BASOSABS 0.0 03/31/2017 1139     Chemistry      Component Value Date/Time   NA 138 01/14/2018 0543   NA 139 03/31/2017 1133   K 4.2 01/14/2018 0543   CL 110 01/14/2018 0543   CO2 20 (L) 01/14/2018 0543   BUN 21 01/14/2018 0543   BUN 17 03/31/2017 1133   CREATININE 1.30 (H) 01/14/2018 0543   CREATININE 0.62 09/26/2014 1043      Component Value Date/Time   CALCIUM 9.4 01/14/2018 0543   ALKPHOS 64 01/10/2018 2216   AST 14 (L) 01/10/2018 2216   ALT 8 01/10/2018 2216   BILITOT 1.2 01/10/2018 2216   BILITOT 0.9 07/07/2016 1519       Assessment and Plan:   79 year old woman with:  1.  Clear-cell renal cell carcinoma  diagnosed in December 2019.  Now has stage IV disease documented on a CT scan in March 2021.  [name] presents with pulmonary nodules and pulmonary lymphadenopathy indicating intermediate risk disease.  The natural course of this disease was reviewed and treatment options were discussed.  The presentation is highly suspicious for stage IV disease although tissue biopsy would be helpful it would be extremely unlikely for this to be anything else but renal cell carcinoma metastasis.  Given her age and other comorbid conditions [name] prefers not to pursue biopsy at this  time.  Treatment options were reviewed including oral targeted therapy, immunotherapy including PD-L1 inhibitors, as well as combination of both.  Risks and benefits of these treatments were reviewed as well as complications were outlined.  Complication associated with oral targeted therapy specifically axitinib was reviewed.  These include hypertension, anorexia and oral pain.  Immune mediated complications including arthritis, thyroid disease, pneumonitis and colitis were reviewed in detail.  After discussion today we opted to proceed with axitinib and Pembrolizumab as an initial treatment.  We will start with a lower dose of axitinib and escalate given her history of hypertension.  We will also proceed very closely given her history of colitis in the past and the possibility of discontinuation of this therapy and prednisone use was discussed today in detail.  [name] is agreeable to proceed and we will arrange for education class in the near future.  2.  IV access: Risks and benefits of a Port-A-Cath was reviewed.  For the time being [name] opted to proceed with peripheral veins.  3.  Antiemetics: Prescription for Compazine was made available to her.  4.  Goals of care and prognosis: Therapy is palliative at this time and her disease is incurable.  Her performance status remains reasonable and aggressive therapy is warranted at this time.  We anticipate improvement in her disease status and disease control for hopefully double-digit months to years.  5.  Follow-up: Will be in the near future to start therapy.  60  minutes were dedicated to this visit. The time was spent on reviewing laboratory data, imaging studies, discussing treatment options, and answering questions regarding future plan.     A copy of this consult has been forwarded to the requesting physician.

## 2019-04-12 ENCOUNTER — Telehealth: Payer: Self-pay | Admitting: Pharmacist

## 2019-04-12 ENCOUNTER — Telehealth: Payer: Self-pay

## 2019-04-12 ENCOUNTER — Telehealth: Payer: Self-pay | Admitting: Oncology

## 2019-04-12 NOTE — Telephone Encounter (Signed)
Oral Oncology Pharmacist Encounter  Received new prescription for Inlyta (axitinib) for the treatment of RCC in conjunction with pembrolizumab, planned duration until disease progression or unacceptable drug toxicity.  TSH and CMP lab ordered. Recommend ordering a UA. Prescription dose and frequency assessed.   Current medication list in Epic reviewed, no DDIs with axitinib identified.  Prescription has been e-scribed to the [facility] for benefits analysis and approval.  Oral Oncology Clinic will continue to follow for insurance authorization, copayment issues, initial counseling and start date.  Darl Pikes, PharmD, BCPS, BCOP, CPP Hematology/Oncology Clinical Pharmacist [facility]/HP/AP [facility] [phone]  04/12/2019 8:32 AM

## 2019-04-12 NOTE — Telephone Encounter (Signed)
Oral Oncology Patient Advocate Encounter  Prior Authorization for [employer] has been approved.    PA# [id number] Effective dates: 04/12/19 through 01/10/20  Patients co-pay is $2326.06  Oral Oncology Clinic will continue to follow.   [place] Patient [place] Phone [phone] Fax [phone] 04/12/2019 8:40 AM

## 2019-04-12 NOTE — Telephone Encounter (Signed)
Scheduled per los. Called patient and husbands phone, not able to leave msg. Called daughter and left msg. Mailed printout

## 2019-04-12 NOTE — Telephone Encounter (Signed)
Oral Oncology Patient Advocate Encounter   Was successful in securing patient a $7000 grant from [place] to provide copayment coverage for Inlyta.  This will keep the out of pocket expense at $0.     I have spoken with patient.    The billing information is as follows and has been shared with [place].   Member ID: [id number] Group ID: [id number] RxBin: [id number] PCN: [id number] Dates of Eligibility: 04/12/19 through 04/11/20  Fund name:  Renal Cell.  [place] Patient [place] Phone [phone] Fax [phone] 04/12/2019 9:31 AM

## 2019-04-12 NOTE — Telephone Encounter (Signed)
Oral Oncology Patient Advocate Encounter  Received notification from [employer] that prior authorization for Inlyta is required.  PA submitted on CoverMyMeds Key [id number] Status is pending  Oral Oncology Clinic will continue to follow.  [place] Patient [place] Phone [phone] Fax [phone] 04/12/2019 8:23 AM

## 2019-04-17 ENCOUNTER — Telehealth: Payer: Self-pay

## 2019-04-17 MED FILL — INLYTA 5 MG TABLET: 5 | 30 days supply | Qty: 30 | Fill #0

## 2019-04-17 NOTE — Telephone Encounter (Signed)
Oral Oncology Patient Advocate Encounter  Was successful in securing patient a $10000 grant from [employer] to provide copayment coverage for Inlyta.  This will keep the out of pocket expense at $0.     [facility] ID: [id number]  I have spoken with the patient.   The billing information is as follows and has been shared with [place].    RxBin: [id number] PCN: [id number] Member ID: [id number] Group ID: [id number] Dates of Eligibility: 03/18/19 through 03/16/20  Fund:  Renal Cell  [place] Patient [place] Phone [phone] Fax [phone] 04/17/2019 10:19 AM

## 2019-04-18 ENCOUNTER — Other Ambulatory Visit: Payer: Self-pay

## 2019-04-18 ENCOUNTER — Inpatient Hospital Stay: Payer: Medicare Other

## 2019-04-19 NOTE — [facility] Notes (Signed)
Pharmacist Chemotherapy Monitoring - Initial Assessment    Anticipated start date: 04/25/19   Regimen:  . Are orders appropriate based on the patient's diagnosis, regimen, and cycle? Yes . Does the plan date match the patient's scheduled date? Yes . Is the sequencing of drugs appropriate? Yes . Are the premedications appropriate for the patient's regimen? Yes . Prior Authorization for treatment is: Pending o If applicable, is the correct biosimilar selected based on the patient's insurance? not applicable  Organ Function and Labs: [name] Are dose adjustments needed based on the patient's renal function, hepatic function, or hematologic function? No . Are appropriate labs ordered prior to the start of patient's treatment? Yes . Other organ system assessment, if indicated: N/A . The following baseline labs, if indicated, have been ordered: pembrolizumab: baseline TSH +/- T4  Dose Assessment: . Are the drug doses appropriate? Yes . Are the following correct: o Drug concentrations Yes o IV fluid compatible with drug Yes o Administration routes Yes o Timing of therapy Yes . If applicable, does the patient have documented access for treatment and/or plans for port-a-cath placement?  . If applicable, have lifetime cumulative doses been properly documented and assessed?  Lifetime Dose Tracking  No doses have been documented on this patient for the following tracked chemicals: Doxorubicin, Epirubicin, Idarubicin, Daunorubicin, Mitoxantrone, Bleomycin, Oxaliplatin, Carboplatin, Liposomal Doxorubicin  o   Toxicity Monitoring/Prevention: . The patient has the following take home antiemetics prescribed: Prochlorperazine . The patient has the following take home medications prescribed: N/A . Medication allergies and previous infusion related reactions, if applicable, have been reviewed and addressed. Yes . The patient's current medication list has been assessed for drug-drug interactions with their  chemotherapy regimen. no or  significant drug-drug interactions were identified on review.  Order Review: . Are the treatment plan orders signed? Yes . Is the patient scheduled to see a provider prior to their treatment? Yes  I verify that I have reviewed each item in the above checklist and answered each question accordingly.  Adelina Mings 04/19/2019 10:28 AM

## 2019-04-19 NOTE — Progress Notes (Signed)
[name] Date of Birth: [birth year]   History of Present Illness: [name] is seen today for follow up CAD, AS, and PAD. She has known CAD with redo CABG in 2001.  She has a known abdominal aortic aneurysm with last ultrasound  showing a  3.1 cm aneurysm in August 2018. She has a history of PAD with right iliac occlusion. Last ABIs in August 2018 showed stable right ABI of 0.75.  She has chronic claudication symptoms with pain in right thigh with walking for long distances. This has not changed.[name] Her last nuclear stress test in January 2016 showed  anterior wall ischemia. This was unchanged from 2012 and 2013. She had a cardiac cath in 2008 which  showed patent grafts.   She has  lymphocytic colitis. Followed by Dr. Gala Romney. This flared last Spring and she was placed on Entecort. Following this she developed edema and elevated BP. She states she felt dizzy, HA, and "crazy". She was started on Avapro. Symptoms resolved after she came off steroids.   Over the past year or so she has experienced significant progression of symptoms of exertional shortness of breath and fatigue. Follow-up echocardiogram performed March 29, 2017 revealed normal left ventricular systolic function with severe aortic stenosis. Peak velocity across the aortic valve measured 4.1 m/s corresponding to mean transvalvular gradient estimated 39 mmHg. The DVI was reported 0.20 with aortic valve area calculated 0.69 cm. Left ventricular ejection fraction was estimated 55-60%. Cardiac cath performed 04/04/2017 revealed peak to peak gradient of 36 mmHg and a mean gradient of 32 mmHg and severe 3 vessel CAD with continued patency of LIMA to mid LAD, free RIMA to the distal LAD, and sequential SVG to the first and second OM of the Cx.   She underwent successful TAVR with a 26 mm Sapien 3 on 05/09/2017. Post operative echo showed a normally functioning TAVR with no significant PVL; mean gradient 11 mm Hg. She was discharged on ASA and  plavix.   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 She returned in early January with sepsis related to an incisional abdominal wall abscess. This was surgically drained and she was treated with antibiotics. Blood cultures were negative.   In March she had CT done showing significant adenopathy in her chest. 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 Getting ready to start therapy with axitnib and pembrolizumab. She notes SOB. No chest pain. Occasional swelling in left ankle. Uses lasix prn. No claudication. Still smokes some. Notes husband died in 04/03/2022 from complications of Covid 19 infection.   Current Outpatient Medications on File Prior to Visit  Medication Sig Dispense Refill  . acetaminophen (TYLENOL) 500 MG tablet Take 1,000 mg by mouth every 6 (six) hours as needed for moderate pain or headache.     . ALPRAZolam (XANAX) 0.25 MG tablet Take 0.25 mg by mouth 3 (three) times daily as needed for anxiety.  0  . amLODipine (NORVASC) 10 MG tablet Take 1 tablet (10 mg total) by mouth daily. 90 tablet 1  . amoxicillin (AMOXIL) 500 MG tablet Take 4 tablets (2,000 mg total) by mouth as directed. 1 hour prior to dental work including cleanings 12 tablet 12  . aspirin EC 81 MG tablet Take 81 mg by mouth at bedtime.     [name] axitinib (INLYTA) 5 MG tablet Take 1 tablet (5 mg total) by mouth daily. 30 tablet 0  . calcium elemental as  carbonate (BARIATRIC TUMS ULTRA) 400 MG chewable tablet Chew 1,000 mg by mouth 3 (three) times daily as needed for heartburn.    . clobetasol ointment (TEMOVATE) 0.05 % Apply topically.    . diclofenac Sodium (VOLTAREN) 1 % GEL APPLY 4 GRAMS TO AFFECTED AREA THREE TIMES A DAY AS NEEDED FOR RIGHT KNEE PAIN    . FLUoxetine (PROZAC) 40 MG capsule Take 40 mg by mouth every evening.    . furosemide (LASIX) 20 MG tablet Take 20 mg by mouth daily as needed for edema.    . irbesartan (AVAPRO) 300 MG tablet Take 1 tablet  (300 mg total) by mouth daily. 90 tablet 2  . levothyroxine (SYNTHROID) 50 MCG tablet Take 1 tablet (50 mcg total) by mouth daily before breakfast. 96 tablet 1  . loperamide (IMODIUM) 2 MG capsule Take 2-4 mg by mouth 4 (four) times daily as needed for diarrhea or loose stools.     . Nebivolol HCl (BYSTOLIC) 20 MG TABS Take 1 tablet (20 mg total) by mouth daily. 30 tablet 11  . nitroGLYCERIN (NITROSTAT) 0.4 MG SL tablet Place 0.4 mg under the tongue every 5 (five) minutes as needed for chest pain (MAX 3 TABLETS).    [name] nystatin (MYCOSTATIN/NYSTOP) powder APPLY SMALL AMOUNT TO AFFECTED AREA THREE TIMES A DAY    . pantoprazole (PROTONIX) 40 MG tablet TAKE 1 TABLET BY MOUTH EVERY DAY 90 tablet 1  . PROAIR HFA 108 (90 Base) MCG/ACT inhaler Inhale 2 puffs into the lungs every 4 (four) hours as needed for shortness of breath or wheezing.  2  . prochlorperazine (COMPAZINE) 10 MG tablet Take 1 tablet (10 mg total) by mouth every 6 (six) hours as needed for nausea or vomiting. 30 tablet 0  . simethicone (MYLICON) 0000000 MG chewable tablet Chew 125 mg by mouth every 6 (six) hours as needed for flatulence.    . simvastatin (ZOCOR) 40 MG tablet Take 1 tablet (40 mg total) by mouth at bedtime. 90 tablet 3  . SYMBICORT 160-4.5 MCG/ACT inhaler Inhale 1 puff into the lungs 2 (two) times daily as needed (for respiratory issues.).   2   No current facility-administered medications on file prior to visit.    Allergies  Allergen Reactions  . Chantix [Varenicline Tartrate] Hives  . Dipyridamole Hives and Rash  . Varenicline Hives    Past Medical History:  Diagnosis Date  . Abdominal aortic aneurysm ([place])    infrarenal diagnosed with maximum measurement at 3.3 cm  . Coronary artery disease   . Depression   . Diverticulosis   . GERD (gastroesophageal reflux disease)   . History of tobacco abuse    Recurrent cigarette smoking  . Hyperlipidemia   . Hyperplastic colon polyp   . Hypertension   . Hypothyroidism    . Ischemic heart disease    had redo bypass surgery in 2001, at that time she had a free right internal mammary to the LAD, LAD endarterectomy, sequential vein graft to the OM1 and OM2 -- last catheterization was in 2008  . Leg fracture    remote right leg fracture  . Microscopic colitis   . Obesity    with gastric bypass surgery in 2004  . PAD (peripheral artery disease) ([facility])    right iliac occlusion  . PONV (postoperative nausea and vomiting)   . Right kidney mass   . S/P CABG x 4 1985   LIMA to LAD, Sequential SVG to OM1-OM2, SVG to RCA - Dr Redmond Pulling  .  S/P redo CABG x 3 2001   Free RIMA to LAD with endarterectomy, sequential SVG to OM1-OM2 - Dr Servando Snare  . 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 ARTERIAL LINE INSERTION Right 05/09/2017   Procedure: ARTERIAL LINE  INSERTION  RIGHT RADIAL ARTERY;  Surgeon: Sherren Mocha, MD;  Location: [facility];  Service: Open Heart Surgery;  Laterality: Right;  . CARDIAC CATHETERIZATION  02/16/1999   Left heart catheterization with selective coronary  angiography, left ventricular angiography (RAO and LAO views), angiography of left internal mammary artery, and angiography of saphenous vein grafts  . CARDIAC CATHETERIZATION  10/20/2006   Est. EF of 55% -- Totally occuladed native coronary circulation -- Persistent patency of the right mammary graft and the distal LAD artery with peristent patency of the left mammary graft to the proximal LAD artery with persistant patency of the saphenous vein graft to the sequentail branches of the OM        . CARPAL TUNNEL RELEASE    . CHOLECYSTECTOMY    . COLONOSCOPY  07/12/2007   Dr.Brodie- normal cecum and rectum, diverticulosis in the sigmoid colon  . COLONOSCOPY  1991   per Dr.Brodie office visit note= hyperplastic polyp  . COLONOSCOPY  N/A 09/30/2014   Dr.Rourk- noraml appearing rectal mucosa, scattered L sided diverticula, colonic mucosa has a somewhat pale friable appearence diffusely, no ulcers or erosions seen, the distal 10cm of terminal ileum appeared entirely normal. bx=benign colorectal mucosa with lymphocytic colitis  . CORONARY ARTERY BYPASS GRAFT  1985  . CORONARY ARTERY BYPASS GRAFT  03/09/1999   Redo CABG x3 --  with the free right internal mammary artey to the LAD with an LAD endarterectomy -- Sequential saphenous vein graft to OM1 and OM2                   . CYSTOSCOPY/RETROGRADE/URETEROSCOPY Right 12/21/2017   Procedure: [id number];  Surgeon: Raynelle Bring, MD;  Location: [facility];  Service: Urology;  Laterality: Right;  . ESOPHAGOGASTRODUODENOSCOPY N/A 09/30/2014   Dr.Rourk-mild erosive reflux esophagitis s/p bariatric surgery  . EYE SURGERY     catracts  . GASTRIC BYPASS  2004  . INCISION AND DRAINAGE ABSCESS N/A 01/11/2018   Procedure: INCISION AND DRAINAGE ABSCESS;  Surgeon: Raynelle Bring, MD;  Location: [facility];  Service: Urology;  Laterality: N/A;  . LAPAROSCOPIC NEPHRECTOMY Right 12/28/2017   Procedure: LAPAROSCOPIC RADICAL NEPHRECTOMY;  Surgeon: Raynelle Bring, MD;  Location: [facility];  Service: Urology;  Laterality: Right;  . RIGHT/LEFT HEART CATH AND CORONARY/GRAFT ANGIOGRAPHY N/A 04/04/2017   Procedure: RIGHT/LEFT HEART CATH AND CORONARY/GRAFT ANGIOGRAPHY;  Surgeon: Martinique, Arilla Hice M, MD;  Location: [facility];  Service: Cardiovascular;  Laterality: N/A;  . TEE WITHOUT CARDIOVERSION N/A 05/09/2017   Procedure: TRANSESOPHAGEAL ECHOCARDIOGRAM (TEE);  Surgeon: Sherren Mocha, MD;  Location: [facility];  Service: Open Heart Surgery;  Laterality: N/A;  . TRANSCATHETER AORTIC VALVE REPLACEMENT, TRANSFEMORAL N/A 05/09/2017   Procedure: TRANSCATHETER AORTIC VALVE REPLACEMENT, TRANSFEMORAL using a 24mm Edwards Sapien 3 Aortic Valve;  Surgeon: Sherren Mocha, MD;  Location: [facility];  Service: Open Heart  Surgery;  Laterality: N/A;    Social History   Tobacco Use  Smoking Status Current Some Day Smoker  . Packs/day: 0.50  . Types: Cigarettes  . Start date: 01/10/1985  . Last attempt to quit: 02/11/2016  .  Years since quitting: 3.2  Smokeless Tobacco Never Used  Tobacco Comment   smokes 1-2 cigarettes a day    Social History   Substance and Sexual Activity  Alcohol Use Yes  . Alcohol/week: 0.0 standard drinks   Comment: rarely wine    Family History  Problem Relation Age of Onset  . Stroke Father   . Diabetes Mother   . Pancreatic cancer Mother     Review of Systems: The review of systems is as above.  All other systems were reviewed and are negative.  Physical Exam: BP 124/64 (BP Location: Left Arm, Cuff Size: Large)   Pulse 65   Temp 97.8 F (36.6 C)   Resp 20   Ht 5\' 7"  (1.702 m)   Wt 215 lb (97.5 kg)   SpO2 97%   BMI 33.67 kg/m  GENERAL:  Obese WF in NAD HEENT:  PERRL, EOMI, sclera are clear. Oropharynx is clear. NECK:  No jugular venous distention, carotid upstroke brisk and symmetric, bilateral  bruits, no thyromegaly or adenopathy LUNGS:  Bibasilar crackles L>R CHEST:  Unremarkable HEART:  RRR,  PMI not displaced or sustained,S1 and S2 within normal limits, no S3, no S4: no clicks, no rubs, slight  systolic murmur RUSB ABD:  Soft, nontender. BS + EXT: pulses in feet not palpable, feet warm and dry. no edema, no cyanosis no clubbing SKIN:  Warm and dry.  No rashes NEURO:  Alert and oriented x 3. Cranial nerves II through XII intact. PSYCH:  Cognitively intact     LABORATORY DATA: Lab Results  Component Value Date   CHOL 149 02/04/2014   HDL 60 02/04/2014   LDLCALC 64 02/04/2014   TRIG 124 02/04/2014   CHOLHDL 2.5 02/04/2014   Lab Results  Component Value Date   WBC 9.4 01/13/2018   HGB 10.1 (L) 01/13/2018   HCT 31.6 (L) 01/13/2018   PLT 229 01/13/2018   GLUCOSE 97 01/14/2018   CHOL 149 02/04/2014   TRIG 124 02/04/2014   HDL 60 02/04/2014    LDLCALC 64 02/04/2014   ALT 8 01/10/2018   AST 14 (L) 01/10/2018   NA 138 01/14/2018   K 4.2 01/14/2018   CL 110 01/14/2018   CREATININE 1.30 (H) 01/14/2018   BUN 21 01/14/2018   CO2 20 (L) 01/14/2018   TSH 4.968 (H) 01/13/2018   INR 1.01 01/10/2018   HGBA1C 5.8 (H) 05/05/2017   Labs dated 10/05/15: glucose 108. Otherwise CMET is normal. Cholesterol 130, triglycerides 114, HDL 63, LDL 44. TSH normal.  Dated 11/02/16: cholesterol 138, triglycerides 78, LDL 49, HDL 73. CMET and CBC normal. Dated 09/14/18: normal CBC. Creatinine 1.49. other chemistries normal. Cholesterol 137, triglycerides 114, HDL 57, LDL 60.  Dated 09/20/18: creatinine 1.02.   Echo 04/19/16: Study Conclusions  - Left ventricle: The cavity size was normal. Wall thickness was   normal. Systolic function was normal. The estimated ejection   fraction was in the range of 60% to 65%. Wall motion was normal;   there were no regional wall motion abnormalities. Doppler   parameters are consistent with pseudonormal left ventricular   relaxation (grade 2 diastolic dysfunction). The E/e&' ratio is   >15, suggesting elevated LV filling pressure. - Aortic valve: Calcified leaflets. Moderate stenosis. Mean   gradient (S): 35 mm Hg. Peak gradient (S): 61 mm Hg. Valve area   (VTI): 0.98 cm^2. Valve area (Vmax): 1.05 cm^2. Valve area   (Vmean): 0.98 cm^2. - Mitral valve: Mildly thickened leaflets .  There was mild   regurgitation. - Left atrium: The atrium was moderately dilated. - Right ventricle: The cavity size was mildly dilated. Systolic   function is mildly reduced. - Tricuspid valve: There was mild regurgitation. - Pulmonary arteries: PA peak pressure: 34 mm Hg (S). - Inferior vena cava: The vessel was normal in size. The   respirophasic diameter changes were in the normal range (>= 50%),   consistent with normal central venous pressure.  Impressions:  - Compared to a prior study in 2012, there is now moderate  aortic   stenosis - AVA around 1.05 cm2 with mean gradient of 35 mmHg.  Echo 03/29/17: Study Conclusions  - Left ventricle: The cavity size was normal. Wall thickness was   increased in a pattern of mild LVH. Systolic function was normal.   The estimated ejection fraction was in the range of 55% to 60%.   Wall motion was normal; there were no regional wall motion   abnormalities. Doppler parameters are consistent with abnormal   left ventricular relaxation (grade 1 diastolic dysfunction). - Aortic valve: Poorly visualized. Probably trileaflet; severely   calcified leaflets. There was severe stenosis. There was mild   regurgitation. Mean gradient (S): 41 mm Hg. Peak gradient (S): 67   mm Hg. Valve area (VTI): 0.69 cm^2. - Mitral valve: Mildly calcified annulus. Mildly calcified leaflets   . There was trivial regurgitation. - Left atrium: The atrium was moderately dilated. - Right ventricle: The cavity size was normal. Systolic function   was mildly reduced. - Tricuspid valve: Peak RV-RA gradient (S): 32 mm Hg. - Pulmonary arteries: PA peak pressure: 35 mm Hg (S). - Inferior vena cava: The vessel was normal in size. The   respirophasic diameter changes were in the normal range (>= 50%),   consistent with normal central venous pressure.  Impressions:  - Normal LV size with mild LV hypertrophy. EF 55-60%. Severe aortic   stenosis, mild aortic insufficiency. Moderate left atrial   enlargement. Normal RV size with mildly decreased systolic   function.  Cardiac cath 04/04/17:  RIGHT/LEFT HEART CATH AND CORONARY/GRAFT ANGIOGRAPHY  Conclusion     Ost LAD to Prox LAD lesion is 100% stenosed.  Prox LAD to Mid LAD lesion is 100% stenosed.  Ost Cx to Mid Cx lesion is 100% stenosed.  Prox RCA to Mid RCA lesion is 100% stenosed.  LIMA graft was visualized by angiography and is normal in caliber.  The graft exhibits no disease.  Seq SVG- OM 1 and OM 2 graft was visualized by  angiography and is large.  SVG graft was visualized by angiography and is normal in caliber.  The graft exhibits no disease.  Dist Graft lesion is 30% stenosed.  Origin lesion is 35% stenosed.  There is severe aortic valve stenosis.  LV end diastolic pressure is mildly elevated.  Hemodynamic findings consistent with mild pulmonary hypertension.   1. Severe 3 vessel occlusive CAD 2. Patent LIMA to the mid LAD. This only supplies a septal perforator.  3. Patent SVG to mid to distal LAD 4. Patent sequential SVG to OM 1 and OM 2 5. Severe Aortic stenosis. Mean gradient 32 mm Hg, peak gradient 35 mm Hg. Valve area 1.1 cm squared. Index 0.56 6. Normal cardiac output 7. Mild pulmonary HTN with mildly elevated LV filling pressures.   Plan: Coronary circulation is unchanged from 2008. Patient has progressive symptomatic aortic stenosis. Will refer for TAVR evaluation.    Echo 06/01/17: Study Conclusions  -  Left ventricle: The cavity size was normal. Wall thickness was   increased in a pattern of mild LVH. Systolic function was normal.   The estimated ejection fraction was in the range of 55% to 60%.   Wall motion was normal; there were no regional wall motion   abnormalities. Doppler parameters are consistent with abnormal   left ventricular relaxation (grade 1 diastolic dysfunction).   Doppler parameters are consistent with high ventricular filling   pressure. - Aortic valve: A bioprosthesis was present. Valve area (VTI): 1.56   cm^2. - Mitral valve: There was mild regurgitation. - Left atrium: The atrium was mildly dilated.  Impressions:  - Normal LV systolic function; mild LVH; mild diastolic   dysfunction; s/p AVR with mean gradient 14 mmHg and no AI; mild   MR; mild LAE; mild TR.  Assessment / Plan: 1. Coronary disease status post CABG. Redo coronary bypass surgery in 2001 with sequential saphenous vein graft to the first and second obtuse marginal vessels and a free  RIMA graft to the LAD. Prior LIMA graft to the proximal LAD. Myoview study January 2016 was stable showing anterior ischemia similar to prior studies.  Cardiac cath in March 2019 showed patent grafts. No  angina. Continue aspirin, beta blocker, and statin therapy.   2. Severe Aortic stenosis s/p TAVR in April 2019. . Follow up Echo satisfactory. Routine  SBE prophylaxis.   3. PAD with right iliac occlusion- chornic. Claudication more noticeable since she is more active.  Dopplers in August 2018 showed stable ABI of 0.75 on the right and normal on the left. Abd/pelvic CT in April 2019 was stable. Repeat dopplers in July 2020 showed no change  4. Abdominal aortic aneurysm measuring 3.4 x 3.0 cm by CT. Abd [place] in July 2020 measured 2.3 cm.   4. Hypertension- well controlled.  Continue  meds. Will need to monitor with new chemotherapy agents.   6. Tobacco abuse-encouraged complete cessation. Smoking only occasionally.   7. Obesity.  8. Lymphocytic colitis. No active symptoms  9. Carotid arterial disease- no significant stenosis.   10. 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 Now with extensive adenopathy in hilar and mediastinal areas c/w stage 4 metastatic disease. As per oncology.    I will follow up in 6 months.

## 2019-04-19 NOTE — Telephone Encounter (Signed)
Oral Chemotherapy Pharmacist Encounter  [place] delivered on 04/18/19. Patient knows the plan is to start the [place] along with her IV infusion on 04/25/19.  Patient Education I spoke with patient for overview of new oral chemotherapy medication: Inlyta (axitinib) for the treatment of RCC in conjunction with pembrolizumab, planned duration until disease progression or unacceptable drug toxicity.   Pt is doing well. Counseled patient on administration, dosing, side effects, monitoring, drug-food interactions, safe handling, storage, and disposal. Patient will take 1 tablet (5 mg total) by mouth daily.  Side effects include but not limited to: HTN, N/V, diarrhea, fatigue, decreased wbc.    Reviewed with patient importance of keeping a medication schedule and plan for any missed doses.  Ms. Yaklin voiced understanding and appreciation. All questions answered. Medication handout placed in the mail.  Provided patient with [facility] phone number. Patient knows to call the office with questions or concerns. [facility] will continue to follow.  Darl Pikes, PharmD, BCPS, BCOP, CPP Hematology/Oncology Clinical Pharmacist [facility] [phone]  04/19/2019 1:50 PM

## 2019-04-22 ENCOUNTER — Encounter: Payer: Self-pay | Admitting: Oncology

## 2019-04-24 ENCOUNTER — Other Ambulatory Visit: Payer: Self-pay

## 2019-04-24 ENCOUNTER — Ambulatory Visit: Payer: Medicare Other | Admitting: Cardiology

## 2019-04-24 ENCOUNTER — Encounter: Payer: Self-pay | Admitting: Cardiology

## 2019-04-24 VITALS — BP 124/64 | HR 65 | Temp 97.8°F | Resp 20 | Ht 67.0 in | Wt 215.0 lb

## 2019-04-24 DIAGNOSIS — I25810 Atherosclerosis of coronary artery bypass graft(s) without angina pectoris: Secondary | ICD-10-CM | POA: Diagnosis not present

## 2019-04-24 DIAGNOSIS — Z952 Presence of prosthetic heart valve: Secondary | ICD-10-CM

## 2019-04-24 DIAGNOSIS — C641 Malignant neoplasm of right kidney, except renal pelvis: Secondary | ICD-10-CM

## 2019-04-24 DIAGNOSIS — I739 Peripheral vascular disease, unspecified: Secondary | ICD-10-CM | POA: Diagnosis not present

## 2019-04-24 DIAGNOSIS — I10 Essential (primary) hypertension: Secondary | ICD-10-CM | POA: Diagnosis not present

## 2019-04-25 ENCOUNTER — Other Ambulatory Visit: Payer: Self-pay

## 2019-04-25 ENCOUNTER — Other Ambulatory Visit: Payer: Self-pay | Admitting: Oncology

## 2019-04-25 ENCOUNTER — Inpatient Hospital Stay: Payer: Medicare Other

## 2019-04-25 ENCOUNTER — Telehealth: Payer: Self-pay

## 2019-04-25 VITALS — BP 152/69 | HR 61 | Temp 98.2°F | Resp 18

## 2019-04-25 DIAGNOSIS — C649 Malignant neoplasm of unspecified kidney, except renal pelvis: Secondary | ICD-10-CM

## 2019-04-25 DIAGNOSIS — C641 Malignant neoplasm of right kidney, except renal pelvis: Secondary | ICD-10-CM

## 2019-04-25 DIAGNOSIS — C7800 Secondary malignant neoplasm of unspecified lung: Secondary | ICD-10-CM | POA: Diagnosis not present

## 2019-04-25 DIAGNOSIS — Z79899 Other long term (current) drug therapy: Secondary | ICD-10-CM | POA: Diagnosis not present

## 2019-04-25 DIAGNOSIS — Z5112 Encounter for antineoplastic immunotherapy: Secondary | ICD-10-CM | POA: Diagnosis present

## 2019-04-25 LAB — CMP (CANCER CENTER ONLY)
ALT: 9 U/L (ref 0–44)
AST: 15 U/L (ref 15–41)
Albumin: 3.3 g/dL — ABNORMAL LOW (ref 3.5–5.0)
Alkaline Phosphatase: 79 U/L (ref 38–126)
Anion gap: 7 (ref 5–15)
BUN: 21 mg/dL (ref 8–23)
CO2: 25 mmol/L (ref 22–32)
Calcium: 9.6 mg/dL (ref 8.9–10.3)
Chloride: 105 mmol/L (ref 98–111)
Creatinine: 1.36 mg/dL — ABNORMAL HIGH (ref 0.44–1.00)
GFR, Est AFR Am: 44 mL/min — ABNORMAL LOW (ref >60)
GFR, Estimated: 38 mL/min — ABNORMAL LOW (ref >60)
Glucose, Bld: 113 mg/dL — ABNORMAL HIGH (ref 70–99)
Potassium: 4.7 mmol/L (ref 3.5–5.1)
Sodium: 137 mmol/L (ref 135–145)
Total Bilirubin: 0.5 mg/dL (ref 0.3–1.2)
Total Protein: 6.7 g/dL (ref 6.5–8.1)

## 2019-04-25 LAB — CBC WITH DIFFERENTIAL (CANCER CENTER ONLY)
Abs Immature Granulocytes: 0.02 10*3/uL (ref 0.00–0.07)
Basophils Absolute: 0.1 10*3/uL (ref 0.0–0.1)
Basophils Relative: 1 %
Eosinophils Absolute: 0.2 10*3/uL (ref 0.0–0.5)
Eosinophils Relative: 3 %
HCT: 34.2 % — ABNORMAL LOW (ref 36.0–46.0)
Hemoglobin: 10.6 g/dL — ABNORMAL LOW (ref 12.0–15.0)
Immature Granulocytes: 0 %
Lymphocytes Relative: 25 %
Lymphs Abs: 1.8 10*3/uL (ref 0.7–4.0)
MCH: 27.5 pg (ref 26.0–34.0)
MCHC: 31 g/dL (ref 30.0–36.0)
MCV: 88.8 fL (ref 80.0–100.0)
Monocytes Absolute: 0.6 10*3/uL (ref 0.1–1.0)
Monocytes Relative: 9 %
Neutro Abs: 4.3 10*3/uL (ref 1.7–7.7)
Neutrophils Relative %: 62 %
Platelet Count: 176 10*3/uL (ref 150–400)
RBC: 3.85 MIL/uL — ABNORMAL LOW (ref 3.87–5.11)
RDW: 14.6 % (ref 11.5–15.5)
WBC Count: 6.9 10*3/uL (ref 4.0–10.5)
nRBC: 0 % (ref 0.0–0.2)

## 2019-04-25 LAB — TSH: TSH: 6.469 u[IU]/mL — ABNORMAL HIGH (ref 0.308–3.960)

## 2019-04-25 MED ORDER — SODIUM CHLORIDE 0.9 % IV SOLN
200.0000 mg | Freq: Once | INTRAVENOUS | Status: AC
  Administered 2019-04-25: 200 mg via INTRAVENOUS
  Filled 2019-04-25: qty 8

## 2019-04-25 MED ORDER — SODIUM CHLORIDE 0.9 % IV SOLN
Freq: Once | INTRAVENOUS | Status: AC
  Filled 2019-04-25: qty 250

## 2019-04-25 NOTE — Patient Instructions (Signed)
[facility] Discharge Instructions for Patients Receiving Chemotherapy  Today you received the following chemotherapy agents Pembrolizumab (KEYTRUDA).  To help prevent nausea and vomiting after your treatment, we encourage you to take your nausea medication as prescribed.   If you develop nausea and vomiting that is not controlled by your nausea medication, call the clinic.   BELOW ARE SYMPTOMS THAT SHOULD BE REPORTED IMMEDIATELY:  *FEVER GREATER THAN 100.5 F  *CHILLS WITH OR WITHOUT FEVER  NAUSEA AND VOMITING THAT IS NOT CONTROLLED WITH YOUR NAUSEA MEDICATION  *UNUSUAL SHORTNESS OF BREATH  *UNUSUAL BRUISING OR BLEEDING  TENDERNESS IN MOUTH AND THROAT WITH OR WITHOUT PRESENCE OF ULCERS  *URINARY PROBLEMS  *BOWEL PROBLEMS  UNUSUAL RASH Items with * indicate a potential emergency and should be followed up as soon as possible.  Feel free to call the clinic should you have any questions or concerns. The clinic phone number is [phone].  Please show the CHEMO ALERT CARD at check-in to the Emergency Department and triage nurse.  Pembrolizumab injection What is this medicine? PEMBROLIZUMAB (pem broe liz ue mab) is a monoclonal antibody. It is used to treat certain types of cancer. This medicine may be used for other purposes; ask your health care provider or pharmacist if you have questions. COMMON BRAND NAME(S): Keytruda What should I tell my health care provider before I take this medicine? They need to know if you have any of these conditions:  diabetes  immune system problems  inflammatory bowel disease  liver disease  lung or breathing disease  lupus  received or scheduled to receive an organ transplant or a stem-cell transplant that uses donor stem cells  an unusual or allergic reaction to pembrolizumab, other medicines, foods, dyes, or preservatives  pregnant or trying to get pregnant  breast-feeding How should I use this  medicine? This medicine is for infusion into a vein. It is given by a health care professional in a hospital or clinic setting. A special MedGuide will be given to you before each treatment. Be sure to read this information carefully each time. Talk to your pediatrician regarding the use of this medicine in children. While this drug may be prescribed for children as young as 6 months for selected conditions, precautions do apply. Overdosage: If you think you have taken too much of this medicine contact a poison control center or emergency room at once. NOTE: This medicine is only for you. Do not share this medicine with others. What if I miss a dose? It is important not to miss your dose. Call your doctor or health care professional if you are unable to keep an appointment. What may interact with this medicine? Interactions have not been studied. Give your health care provider a list of all the medicines, herbs, non-prescription drugs, or dietary supplements you use. Also tell them if you smoke, drink alcohol, or use illegal drugs. Some items may interact with your medicine. This list may not describe all possible interactions. Give your health care provider a list of all the medicines, herbs, non-prescription drugs, or dietary supplements you use. Also tell them if you smoke, drink alcohol, or use illegal drugs. Some items may interact with your medicine. What should I watch for while using this medicine? Your condition will be monitored carefully while you are receiving this medicine. You may need blood work done while you are taking this medicine. Do not become pregnant while taking this medicine or for 4 months after stopping it. Women   should inform their doctor if they wish to become pregnant or think they might be pregnant. There is a potential for serious side effects to an unborn child. Talk to your health care professional or pharmacist for more information. Do not breast-feed an infant while  taking this medicine or for 4 months after the last dose. What side effects may I notice from receiving this medicine? Side effects that you should report to your doctor or health care professional as soon as possible:  allergic reactions like skin rash, itching or hives, swelling of the face, lips, or tongue  bloody or black, tarry  breathing problems  changes in vision  chest pain  chills  confusion  constipation  cough  diarrhea  dizziness or feeling faint or lightheaded  fast or irregular heartbeat  fever  flushing  joint pain  low blood counts - this medicine may decrease the number of white blood cells, red blood cells and platelets. You may be at increased risk for infections and bleeding.  muscle pain  muscle weakness  pain, tingling, numbness in the hands or feet  persistent headache  redness, blistering, peeling or loosening of the skin, including inside the mouth  signs and symptoms of high blood sugar such as dizziness; dry mouth; dry skin; fruity breath; nausea; stomach pain; increased hunger or thirst; increased urination  signs and symptoms of kidney injury like trouble passing urine or change in the amount of urine  signs and symptoms of liver injury like dark urine, light-colored stools, loss of appetite, nausea, right upper belly pain, yellowing of the eyes or skin  sweating  swollen lymph nodes  weight loss Side effects that usually do not require medical attention (report to your doctor or health care professional if they continue or are bothersome):  decreased appetite  hair loss  muscle pain  tiredness This list may not describe all possible side effects. Call your doctor for medical advice about side effects. You may report side effects to FDA at 1-800-FDA-1088. Where should I keep my medicine? This drug is given in a hospital or clinic and will not be stored at home. NOTE: This sheet is a summary. It may not cover all  possible information. If you have questions about this medicine, talk to your doctor, pharmacist, or health care provider.  2020 Elsevier/Gold Standard (2018-11-02 18:07:58)  Coronavirus (COVID-19) Are you at risk?  Are you at risk for the Coronavirus (COVID-19)?  To be considered HIGH RISK for Coronavirus (COVID-19), you have to meet the following criteria:  . Traveled to [place], [place], [place], [place] or [place]; or in the [place] to [place], [place], [place], or [place]; and have fever, cough, and shortness of breath within the last 2 weeks of travel OR . Been in close contact with a person diagnosed with COVID-19 within the last 2 weeks and have fever, cough, and shortness of breath . IF YOU DO NOT MEET THESE CRITERIA, YOU ARE CONSIDERED LOW RISK FOR COVID-19.  What to do if you are HIGH RISK for COVID-19?  [name] If you are having a medical emergency, call 911. . Seek medical care right away. Before you go to a doctor's office, urgent care or emergency department, call ahead and tell them about your recent travel, contact with someone diagnosed with COVID-19, and your symptoms. You should receive instructions from your physician's office regarding next steps of care.  . When you arrive at healthcare provider, tell the healthcare staff immediately you  have returned from visiting [place], [place], [place], [place] or [place]; or traveled in the [place] to [place], [place], [place], or [place]; in the last two weeks or you have been in close contact with a person diagnosed with COVID-19 in the last 2 weeks.   . Tell the health care staff about your symptoms: fever, cough and shortness of breath. . After you have been seen by a medical provider, you will be either: o Tested for (COVID-19) and discharged home on quarantine except to seek medical care if symptoms worsen, and asked to  - Stay home and avoid contact with others until you get your results (4-5 days)  - Avoid  travel on public transportation if possible (such as bus, train, or airplane) or o Sent to the Emergency Department by EMS for evaluation, COVID-19 testing, and possible admission depending on your condition and test results.  What to do if you are LOW RISK for COVID-19?  Reduce your risk of any infection by using the same precautions used for avoiding the common cold or flu:  [name] Wash your hands often with soap and warm water for at least 20 seconds.  If soap and water are not readily available, use an alcohol-based hand sanitizer with at least 60% alcohol.  . If coughing or sneezing, cover your mouth and nose by coughing or sneezing into the elbow areas of your shirt or coat, into a tissue or into your sleeve (not your hands). . Avoid shaking hands with others and consider head nods or verbal greetings only. . Avoid touching your eyes, nose, or mouth with unwashed hands.  . Avoid close contact with people who are sick. . Avoid places or events with large numbers of people in one location, like concerts or sporting events. . Carefully consider travel plans you have or are making. . If you are planning any travel outside or inside the [place], visit the CDC's Travelers' Health webpage for the latest health notices. . If you have some symptoms but not all symptoms, continue to monitor at home and seek medical attention if your symptoms worsen. . If you are having a medical emergency, call 911.   [place] / e-Visit: [email or web address]         [facility]: [facility]: [id number]                   [facility]: [phone]

## 2019-04-25 NOTE — Telephone Encounter (Signed)
Patient requested for records to be sent to Dr. Harl Bowie at [facility] in [place], [place]. Patient filled out a Medical Release of information.  Last office visit notes and most recent labs were faxed to [phone].  Confirmation of fax receipt received.

## 2019-04-26 ENCOUNTER — Telehealth: Payer: Self-pay | Admitting: Emergency Medicine

## 2019-04-26 NOTE — Telephone Encounter (Signed)
-----   Message from Georgianne Fick, RN sent at 04/25/2019 10:59 AM EDT ----- Regarding: Dr. Hazeline Junker First Time Pembrolizumab Patient received first time Pembrolizumab [facility]) today and tolerated this well.

## 2019-04-26 NOTE — Telephone Encounter (Signed)
Chemo f/u call.  Pt denies any questions/concerns at this time.  Denies any symptoms or side effects from infusion at this time.  Pt aware to f/u as needed.

## 2019-05-07 ENCOUNTER — Other Ambulatory Visit: Payer: Self-pay | Admitting: Oncology

## 2019-05-08 ENCOUNTER — Other Ambulatory Visit: Payer: Self-pay | Admitting: Radiology

## 2019-05-09 ENCOUNTER — Encounter (HOSPITAL_COMMUNITY): Payer: Self-pay

## 2019-05-09 ENCOUNTER — Other Ambulatory Visit: Payer: Self-pay | Admitting: Oncology

## 2019-05-09 ENCOUNTER — Other Ambulatory Visit: Payer: Self-pay

## 2019-05-09 ENCOUNTER — Ambulatory Visit (HOSPITAL_COMMUNITY)
Admission: RE | Admit: 2019-05-09 | Discharge: 2019-05-09 | Disposition: A | Discharge status: Home / Self Care | Payer: Medicare Other | Source: Ambulatory Visit | Attending: Oncology | Admitting: Oncology

## 2019-05-09 DIAGNOSIS — Z951 Presence of aortocoronary bypass graft: Secondary | ICD-10-CM | POA: Diagnosis not present

## 2019-05-09 DIAGNOSIS — I739 Peripheral vascular disease, unspecified: Secondary | ICD-10-CM | POA: Diagnosis not present

## 2019-05-09 DIAGNOSIS — Z9884 Bariatric surgery status: Secondary | ICD-10-CM | POA: Insufficient documentation

## 2019-05-09 DIAGNOSIS — E785 Hyperlipidemia, unspecified: Secondary | ICD-10-CM | POA: Diagnosis not present

## 2019-05-09 DIAGNOSIS — Z79899 Other long term (current) drug therapy: Secondary | ICD-10-CM | POA: Diagnosis not present

## 2019-05-09 DIAGNOSIS — I714 Abdominal aortic aneurysm, without rupture: Secondary | ICD-10-CM | POA: Diagnosis not present

## 2019-05-09 DIAGNOSIS — K219 Gastro-esophageal reflux disease without esophagitis: Secondary | ICD-10-CM | POA: Insufficient documentation

## 2019-05-09 DIAGNOSIS — I2510 Atherosclerotic heart disease of native coronary artery without angina pectoris: Secondary | ICD-10-CM | POA: Insufficient documentation

## 2019-05-09 DIAGNOSIS — C649 Malignant neoplasm of unspecified kidney, except renal pelvis: Secondary | ICD-10-CM | POA: Diagnosis present

## 2019-05-09 DIAGNOSIS — Z7982 Long term (current) use of aspirin: Secondary | ICD-10-CM | POA: Insufficient documentation

## 2019-05-09 DIAGNOSIS — F329 Major depressive disorder, single episode, unspecified: Secondary | ICD-10-CM | POA: Insufficient documentation

## 2019-05-09 DIAGNOSIS — Z952 Presence of prosthetic heart valve: Secondary | ICD-10-CM | POA: Insufficient documentation

## 2019-05-09 DIAGNOSIS — E039 Hypothyroidism, unspecified: Secondary | ICD-10-CM | POA: Insufficient documentation

## 2019-05-09 DIAGNOSIS — E669 Obesity, unspecified: Secondary | ICD-10-CM | POA: Diagnosis not present

## 2019-05-09 DIAGNOSIS — F17210 Nicotine dependence, cigarettes, uncomplicated: Secondary | ICD-10-CM | POA: Insufficient documentation

## 2019-05-09 DIAGNOSIS — Z79890 Hormone replacement therapy (postmenopausal): Secondary | ICD-10-CM | POA: Insufficient documentation

## 2019-05-09 DIAGNOSIS — I10 Essential (primary) hypertension: Secondary | ICD-10-CM | POA: Insufficient documentation

## 2019-05-09 DIAGNOSIS — I259 Chronic ischemic heart disease, unspecified: Secondary | ICD-10-CM | POA: Diagnosis not present

## 2019-05-09 HISTORY — PX: IR IMAGING GUIDED PORT INSERTION: IMG5740

## 2019-05-09 LAB — CBC WITH DIFFERENTIAL/PLATELET
Abs Immature Granulocytes: 0.01 10*3/uL (ref 0.00–0.07)
Basophils Absolute: 0.1 10*3/uL (ref 0.0–0.1)
Basophils Relative: 1 %
Eosinophils Absolute: 0.2 10*3/uL (ref 0.0–0.5)
Eosinophils Relative: 3 %
HCT: 41.4 % (ref 36.0–46.0)
Hemoglobin: 12.8 g/dL (ref 12.0–15.0)
Immature Granulocytes: 0 %
Lymphocytes Relative: 30 %
Lymphs Abs: 1.9 10*3/uL (ref 0.7–4.0)
MCH: 27.3 pg (ref 26.0–34.0)
MCHC: 30.9 g/dL (ref 30.0–36.0)
MCV: 88.3 fL (ref 80.0–100.0)
Monocytes Absolute: 0.8 10*3/uL (ref 0.1–1.0)
Monocytes Relative: 12 %
Neutro Abs: 3.5 10*3/uL (ref 1.7–7.7)
Neutrophils Relative %: 54 %
Platelets: 193 10*3/uL (ref 150–400)
RBC: 4.69 MIL/uL (ref 3.87–5.11)
RDW: 14.1 % (ref 11.5–15.5)
WBC: 6.4 10*3/uL (ref 4.0–10.5)
nRBC: 0 % (ref 0.0–0.2)

## 2019-05-09 LAB — BASIC METABOLIC PANEL
Anion gap: 9 (ref 5–15)
BUN: 22 mg/dL (ref 8–23)
CO2: 25 mmol/L (ref 22–32)
Calcium: 9.5 mg/dL (ref 8.9–10.3)
Chloride: 103 mmol/L (ref 98–111)
Creatinine, Ser: 1.2 mg/dL — ABNORMAL HIGH (ref 0.44–1.00)
GFR calc Af Amer: 51 mL/min — ABNORMAL LOW (ref >60)
GFR calc non Af Amer: 44 mL/min — ABNORMAL LOW (ref >60)
Glucose, Bld: 106 mg/dL — ABNORMAL HIGH (ref 70–99)
Potassium: 4.2 mmol/L (ref 3.5–5.1)
Sodium: 137 mmol/L (ref 135–145)

## 2019-05-09 LAB — PROTIME-INR
INR: 1 (ref 0.8–1.2)
Prothrombin Time: 12.7 seconds (ref 11.4–15.2)

## 2019-05-09 MED ORDER — HEPARIN SOD (PORK) LOCK FLUSH 100 UNIT/ML IV SOLN
INTRAVENOUS | Status: AC | PRN
  Administered 2019-05-09: 500 [IU] via INTRAVENOUS

## 2019-05-09 MED ORDER — FENTANYL CITRATE (PF) 100 MCG/2ML IJ SOLN
INTRAMUSCULAR | Status: AC
  Filled 2019-05-09: qty 2

## 2019-05-09 MED ORDER — FENTANYL CITRATE (PF) 100 MCG/2ML IJ SOLN
INTRAMUSCULAR | Status: AC | PRN
  Administered 2019-05-09 (×4): 25 ug via INTRAVENOUS

## 2019-05-09 MED ORDER — CEFAZOLIN SODIUM-DEXTROSE 2-4 GM/100ML-% IV SOLN
INTRAVENOUS | Status: AC
  Administered 2019-05-09: 2 g via INTRAVENOUS
  Filled 2019-05-09: qty 100

## 2019-05-09 MED ORDER — SODIUM CHLORIDE 0.9 % IV SOLN: INTRAVENOUS | Status: DC

## 2019-05-09 MED ORDER — LIDOCAINE-EPINEPHRINE 1 %-1:100000 IJ SOLN
INTRAMUSCULAR | Status: AC | PRN
  Administered 2019-05-09 (×2): 10 mL via INTRADERMAL

## 2019-05-09 MED ORDER — CEFAZOLIN SODIUM-DEXTROSE 2-4 GM/100ML-% IV SOLN: 2.0000 g | INTRAVENOUS | Status: AC

## 2019-05-09 MED ORDER — MIDAZOLAM HCL 2 MG/2ML IJ SOLN
INTRAMUSCULAR | Status: AC
  Filled 2019-05-09: qty 2

## 2019-05-09 MED ORDER — HEPARIN SOD (PORK) LOCK FLUSH 100 UNIT/ML IV SOLN
INTRAVENOUS | Status: AC
  Filled 2019-05-09: qty 5

## 2019-05-09 MED ORDER — LIDOCAINE-EPINEPHRINE 1 %-1:100000 IJ SOLN
INTRAMUSCULAR | Status: AC
  Filled 2019-05-09: qty 1

## 2019-05-09 MED ORDER — MIDAZOLAM HCL 2 MG/2ML IJ SOLN
INTRAMUSCULAR | Status: AC | PRN
  Administered 2019-05-09 (×4): 0.5 mg via INTRAVENOUS

## 2019-05-09 NOTE — Discharge Instructions (Signed)
Do not use the lidocaine cream (EMLA cream) until after the incision has healed. The petroleum in the EMLA cream will dissolve the skin glue. The skin over your new port will come apart resulting in an infection.Use ice in a zip lock bag over your new port for 2-3 minutes prior to the nurses accessing your new port.    Implanted Port Insertion, Care After This sheet gives you information about how to care for yourself after your procedure. Your health care provider may also give you more specific instructions. If you have problems or questions, contact your health care provider. What can I expect after the procedure? After the procedure, it is common to have:  Discomfort at the port insertion site.  Bruising on the skin over the port. This should improve over 3-4 days. Follow these instructions at home: [facility] care  After your port is placed, you will get a manufacturer's information card. The card has information about your port. Keep this card with you at all times.  Take care of the port as told by your health care provider. Ask your health care provider if you or a family member can get training for taking care of the port at home. A home health care nurse may also take care of the port.  Make sure to remember what type of port you have. Incision care      Follow instructions from your health care provider about how to take care of your port insertion site. Make sure you: ? Wash your hands with soap and water before and after you change your bandage (dressing). If soap and water are not available, use hand sanitizer. ? Change your dressing as told by your health care provider.  Leave  skin glue in place. These skin closures may need to stay in place for 2 weeks or longer.   Check your port insertion site every day for signs of infection. Check for: ? Redness, swelling, or pain. ? Fluid or blood. ? Warmth. ? Pus or a bad smell. Activity  Return to your normal activities as told  by your health care provider. Ask your health care provider what activities are safe for you.  Do not lift anything that is heavier than 10 lb (4.5 kg), or the limit that you are told, until your health care provider says that it is safe. General instructions  Take over-the-counter and prescription medicines only as told by your health care provider.  Do not take baths, swim, or use a hot tub until your health care provider approves. You may shower tomorrow after removing your dressing.  Do not drive for 24 hours if you were given a sedative during your procedure.  Wear a medical alert bracelet in case of an emergency. This will tell any health care providers that you have a port.  Keep all follow-up visits as told by your health care provider. This is important. Contact a health care provider if:  You cannot flush your port with saline as directed, or you cannot draw blood from the port.  You have a fever or chills.  You have redness, swelling, or pain around your port insertion site.  You have fluid or blood coming from your port insertion site.  Your port insertion site feels warm to the touch.  You have pus or a bad smell coming from the port insertion site. Get help right away if:  You have chest pain or shortness of breath.  You have bleeding from your port  that you cannot control. Summary  Take care of the port as told by your health care provider. Keep the manufacturer's information card with you at all times.  Change your dressing as told by your health care provider.  Contact a health care provider if you have a fever or chills or if you have redness, swelling, or pain around your port insertion site.  Keep all follow-up visits as told by your health care provider. This information is not intended to replace advice given to you by your health care provider. Make sure you discuss any questions you have with your health care provider. Document Revised: 07/25/2017  Document Reviewed: 07/25/2017 Elsevier Patient Education  [place].    Moderate Conscious Sedation, Adult, Care After These instructions provide you with information about caring for yourself after your procedure. Your health care provider may also give you more specific instructions. Your treatment has been planned according to current medical practices, but problems sometimes occur. Call your health care provider if you have any problems or questions after your procedure. What can I expect after the procedure? After your procedure, it is common:  To feel sleepy for several hours.  To feel clumsy and have poor balance for several hours.  To have poor judgment for several hours.  To vomit if you eat too soon. Follow these instructions at home: For at least 24 hours after the procedure:   Do not: ? Participate in activities where you could fall or become injured. ? Drive. ? Use heavy machinery. ? Drink alcohol. ? Take sleeping pills or medicines that cause drowsiness. ? Make important decisions or sign legal documents. ? Take care of children on your own.  Rest. Eating and drinking  Follow the diet recommended by your health care provider.  If you vomit: ? Drink water, juice, or soup when you can drink without vomiting. ? Make sure you have little or no nausea before eating solid foods. General instructions  Have a responsible adult stay with you until you are awake and alert.  Take over-the-counter and prescription medicines only as told by your health care provider.  If you smoke, do not smoke without supervision.  Keep all follow-up visits as told by your health care provider. This is important. Contact a health care provider if:  You keep feeling nauseous or you keep vomiting.  You feel light-headed.  You develop a rash.  You have a fever. Get help right away if:  You have trouble breathing. This information is not intended to replace advice  given to you by your health care provider. Make sure you discuss any questions you have with your health care provider. Document Revised: 12/09/2016 Document Reviewed: 04/18/2015 Elsevier Patient Education  2020 [employer].

## 2019-05-09 NOTE — H&P (Signed)
Chief Complaint: Patient was seen in consultation today for RCC/Port-a-cath placement.  Referring Physician(s): Wyatt Portela  Supervising Physician: Sandi Mariscal  Patient Status: [facility] - Out-pt  History of Present Illness: [name] is a 79 y.o. female with a past medical history of hypertension, hyperlipidemia, CAD s/p CABG x4 and x3, PAD, s/p aortic valve replacement, GERD, diverticulosis, microscopic colitis, hypothyroidism, obesity, depression, and prior tobacco use. She was unfortunately diagnosed with RCC in 12/2017. She underwent a laparoscopic right radical nephrectomy in OR 12/28/2017 by Dr. Alinda Money. Unfortunately, she was found to have presumed metastatic disease (based on CT results) in 03/2019. Her cancer is currently managed by Dr. Alen Blew. She has tentative plans to begin immunotherapy/targeted therapy as management.  IR requested by Dr. Alen Blew for possible image-guided Port-a-cath placement. Patient awake and alert sitting in bed. Complains of dyspnea secondary to COPD/metastatic disease, stable at this time. Denies fever, chills, chest pain, abdominal pain, or headache.   Past Medical History:  Diagnosis Date  . Abdominal aortic aneurysm ([place])    infrarenal diagnosed with maximum measurement at 3.3 cm  . Coronary artery disease   . Depression   . Diverticulosis   . GERD (gastroesophageal reflux disease)   . History of tobacco abuse    Recurrent cigarette smoking  . Hyperlipidemia   . Hyperplastic colon polyp   . Hypertension   . Hypothyroidism   . Ischemic heart disease    had redo bypass surgery in 2001, at that time she had a free right internal mammary to the LAD, LAD endarterectomy, sequential vein graft to the OM1 and OM2 -- last catheterization was in 2008  . Leg fracture    remote right leg fracture  . Microscopic colitis   . Obesity    with gastric bypass surgery in 2004  . PAD (peripheral artery disease) (HCC)    right iliac occlusion  . PONV  (postoperative nausea and vomiting)   . Right kidney mass   . S/P CABG x 4 1985   LIMA to LAD, Sequential SVG to OM1-OM2, SVG to RCA - Dr Redmond Pulling  . S/P redo CABG x 3 2001   Free RIMA to LAD with endarterectomy, sequential SVG to OM1-OM2 - Dr Servando Snare  . 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 ARTERIAL LINE INSERTION Right 05/09/2017   Procedure: ARTERIAL LINE  INSERTION  RIGHT RADIAL ARTERY;  Surgeon: Sherren Mocha, MD;  Location: [facility];  Service: Open Heart Surgery;  Laterality: Right;  . CARDIAC CATHETERIZATION  02/16/1999   Left heart catheterization with selective coronary  angiography, left ventricular angiography (RAO and LAO views), angiography of left internal mammary artery, and angiography of saphenous vein grafts  . CARDIAC CATHETERIZATION  10/20/2006   Est. EF of 55% -- Totally occuladed native coronary circulation -- Persistent patency of the right mammary graft and the distal LAD artery with peristent patency of the left mammary graft to the proximal LAD artery with persistant patency of the saphenous vein graft to the sequentail branches of the OM        . CARPAL TUNNEL RELEASE    . CHOLECYSTECTOMY    . COLONOSCOPY  07/12/2007   Dr.Brodie- normal cecum and rectum, diverticulosis in the sigmoid colon  . COLONOSCOPY  1991   per Dr.Brodie office visit note= hyperplastic polyp  . COLONOSCOPY N/A  09/30/2014   Dr.Rourk- noraml appearing rectal mucosa, scattered L sided diverticula, colonic mucosa has a somewhat pale friable appearence diffusely, no ulcers or erosions seen, the distal 10cm of terminal ileum appeared entirely normal. bx=benign colorectal mucosa with lymphocytic colitis  . CORONARY ARTERY BYPASS GRAFT  1985  . CORONARY ARTERY BYPASS GRAFT  03/09/1999   Redo CABG x3 --  with the free right  internal mammary artey to the LAD with an LAD endarterectomy -- Sequential saphenous vein graft to OM1 and OM2                   . CYSTOSCOPY/RETROGRADE/URETEROSCOPY Right 12/21/2017   Procedure: [id number];  Surgeon: Raynelle Bring, MD;  Location: [facility];  Service: Urology;  Laterality: Right;  . ESOPHAGOGASTRODUODENOSCOPY N/A 09/30/2014   Dr.Rourk-mild erosive reflux esophagitis s/p bariatric surgery  . EYE SURGERY     catracts  . GASTRIC BYPASS  2004  . INCISION AND DRAINAGE ABSCESS N/A 01/11/2018   Procedure: INCISION AND DRAINAGE ABSCESS;  Surgeon: Raynelle Bring, MD;  Location: [facility];  Service: Urology;  Laterality: N/A;  . LAPAROSCOPIC NEPHRECTOMY Right 12/28/2017   Procedure: LAPAROSCOPIC RADICAL NEPHRECTOMY;  Surgeon: Raynelle Bring, MD;  Location: [facility];  Service: Urology;  Laterality: Right;  . RIGHT/LEFT HEART CATH AND CORONARY/GRAFT ANGIOGRAPHY N/A 04/04/2017   Procedure: RIGHT/LEFT HEART CATH AND CORONARY/GRAFT ANGIOGRAPHY;  Surgeon: Martinique, Peter M, MD;  Location: [facility];  Service: Cardiovascular;  Laterality: N/A;  . TEE WITHOUT CARDIOVERSION N/A 05/09/2017   Procedure: TRANSESOPHAGEAL ECHOCARDIOGRAM (TEE);  Surgeon: Sherren Mocha, MD;  Location: [facility];  Service: Open Heart Surgery;  Laterality: N/A;  . TRANSCATHETER AORTIC VALVE REPLACEMENT, TRANSFEMORAL N/A 05/09/2017   Procedure: TRANSCATHETER AORTIC VALVE REPLACEMENT, TRANSFEMORAL using a 68mm Edwards Sapien 3 Aortic Valve;  Surgeon: Sherren Mocha, MD;  Location: [facility];  Service: Open Heart Surgery;  Laterality: N/A;    Allergies: Chantix [varenicline tartrate], Dipyridamole, and Varenicline  Medications: Prior to Admission medications   Medication Sig Start Date End Date Taking? Authorizing Provider  acetaminophen (TYLENOL) 500 MG tablet Take 1,000 mg by mouth every 6 (six) hours as needed for moderate pain or headache.     [provider]  ALPRAZolam Duanne Moron) 0.25 MG tablet Take 0.25 mg  by mouth 3 (three) times daily as needed for anxiety. 03/20/17   [provider]  amLODipine (NORVASC) 10 MG tablet Take 1 tablet (10 mg total) by mouth daily. 03/22/19   Martinique, Peter M, MD  amoxicillin (AMOXIL) 500 MG tablet Take 4 tablets (2,000 mg total) by mouth as directed. 1 hour prior to dental work including cleanings 04/27/18   Eileen Stanford, PA-C  aspirin EC 81 MG tablet Take 81 mg by mouth at bedtime.     [provider]  calcium elemental as carbonate (BARIATRIC TUMS ULTRA) 400 MG chewable tablet Chew 1,000 mg by mouth 3 (three) times daily as needed for heartburn.    [provider]  clobetasol ointment (TEMOVATE) 0.05 % Apply topically. 04/03/19 04/02/20  [provider]  diclofenac Sodium (VOLTAREN) 1 % GEL APPLY 4 GRAMS TO AFFECTED AREA THREE TIMES A DAY AS NEEDED FOR RIGHT KNEE PAIN 07/31/18   [provider]  FLUoxetine (PROZAC) 40 MG capsule Take 40 mg by mouth every evening.    [provider]  furosemide (LASIX) 20 MG tablet Take 20 mg by mouth daily as needed for edema.    [provider]  INLYTA 5 MG tablet TAKE  1 TABLET (5 MG TOTAL) BY MOUTH DAILY. 05/07/19   Wyatt Portela, MD  irbesartan (AVAPRO) 300 MG tablet Take 1 tablet (300 mg total) by mouth daily. 12/24/18   Martinique, Peter M, MD  levothyroxine (SYNTHROID) 50 MCG tablet Take 1 tablet (50 mcg total) by mouth daily before breakfast. 09/19/18   Martinique, Peter M, MD  loperamide (IMODIUM) 2 MG capsule Take 2-4 mg by mouth 4 (four) times daily as needed for diarrhea or loose stools.     [provider]  Nebivolol HCl (BYSTOLIC) 20 MG TABS Take 1 tablet (20 mg total) by mouth daily. 02/22/19   Martinique, Peter M, MD  nitroGLYCERIN (NITROSTAT) 0.4 MG SL tablet Place 0.4 mg under the tongue every 5 (five) minutes as needed for chest pain (MAX 3 TABLETS).    [provider]  nystatin (MYCOSTATIN/NYSTOP) powder APPLY SMALL AMOUNT TO AFFECTED AREA THREE TIMES  A DAY 12/12/18   [provider]  pantoprazole (PROTONIX) 40 MG tablet TAKE 1 TABLET BY MOUTH EVERY DAY 02/20/19   Annitta Needs, NP  PROAIR HFA 108 [id number] Base) MCG/ACT inhaler Inhale 2 puffs into the lungs every 4 (four) hours as needed for shortness of breath or wheezing. 03/14/17   [provider]  prochlorperazine (COMPAZINE) 10 MG tablet Take 1 tablet (10 mg total) by mouth every 6 (six) hours as needed for nausea or vomiting. 04/11/19   Wyatt Portela, MD  simethicone (MYLICON) 0000000 MG chewable tablet Chew 125 mg by mouth every 6 (six) hours as needed for flatulence.    [provider]  simvastatin (ZOCOR) 40 MG tablet Take 1 tablet (40 mg total) by mouth at bedtime. 03/18/19   Martinique, Peter M, MD  SYMBICORT 160-4.5 MCG/ACT inhaler Inhale 1 puff into the lungs 2 (two) times daily as needed (for respiratory issues.).  03/14/17   [provider]     Family History  Problem Relation Age of Onset  . Stroke Father   . Diabetes Mother   . Pancreatic cancer Mother     Social History   Socioeconomic History  . Marital status: Married    Spouse name: Not on file  . Number of children: Not on file  . Years of education: Not on file  . Highest education level: Not on file  Occupational History  . Occupation: reitred    Fish farm manager: [employer]  Tobacco Use  . Smoking status: Current Some Day Smoker    Packs/day: 0.50    Types: Cigarettes    Start date: 01/10/1985    Last attempt to quit: 02/11/2016    Years since quitting: 3.2  . Smokeless tobacco: Never Used  . Tobacco comment: smokes 1-2 cigarettes a day  Substance and Sexual Activity  . Alcohol use: Yes    Alcohol/week: 0.0 standard drinks    Comment: rarely wine  . Drug use: No  . Sexual activity: Never  Other Topics Concern  . Not on file  Social History Narrative  . Not on file   Social Determinants of Health   Financial Resource Strain:   . Difficulty of Paying Living Expenses:   Food  Insecurity:   . Worried About Charity fundraiser in the Last Year:   . Arboriculturist in the Last Year:   Transportation Needs:   . Film/video editor (Medical):   [name] Lack of Transportation (Non-Medical):   Physical Activity:   . Days of Exercise per Week:   .  Minutes of Exercise per Session:   Stress:   . Feeling of Stress :   Social Connections:   . Frequency of Communication with Friends and Family:   . Frequency of Social Gatherings with Friends and Family:   . Attends Religious Services:   . Active Member of Clubs or Organizations:   . Attends Archivist Meetings:   [name] Marital Status:      Review of Systems: A 12 point ROS discussed and pertinent positives are indicated in the HPI above.  All other systems are negative.  Review of Systems  Constitutional: Negative for chills and fever.  Respiratory: Positive for shortness of breath. Negative for wheezing.   Cardiovascular: Negative for chest pain and palpitations.  Gastrointestinal: Negative for abdominal pain.  Neurological: Negative for headaches.  Psychiatric/Behavioral: Negative for behavioral problems and confusion.    Vital Signs: BP (!) 145/77   Pulse 68   Temp 97.6 F (36.4 C) (Oral)   Resp 18   SpO2 97%   Physical Exam Vitals and nursing note reviewed.  Constitutional:      General: She is not in acute distress.    Appearance: Normal appearance.  Cardiovascular:     Rate and Rhythm: Normal rate and regular rhythm.     Heart sounds: Normal heart sounds. No murmur.  Pulmonary:     Effort: Pulmonary effort is normal. No respiratory distress.     Breath sounds: Normal breath sounds. No wheezing.  Skin:    General: Skin is warm and dry.  Neurological:     Mental Status: She is alert and oriented to person, place, and time.      MD Evaluation Airway: WNL Heart: WNL Abdomen: WNL Chest/ Lungs: WNL ASA  Classification: 3 Mallampati/Airway Score: One   Imaging: No results  found.  Labs:  CBC: Recent Labs    04/25/19 0818 05/09/19 1036  WBC 6.9 6.4  HGB 10.6* 12.8  HCT 34.2* 41.4  PLT 176 193    COAGS: Recent Labs    05/09/19 1036  INR 1.0    BMP: Recent Labs    04/25/19 0818  NA 137  K 4.7  CL 105  CO2 25  GLUCOSE 113*  BUN 21  CALCIUM 9.6  CREATININE 1.36*  GFRNONAA 38*  GFRAA 44*    LIVER FUNCTION TESTS: Recent Labs    04/25/19 0818  BILITOT 0.5  AST 15  ALT 9  ALKPHOS 79  PROT 6.7  ALBUMIN 3.3*     Assessment and Plan:  RCC, presumed stage IV, with tentative plans to begin immunotherapy/targeted therapy as management. Plan for image-guided Port-a-cath placement today in IR. Patient is NPO. Afebrile.  Risks and benefits of image-guided Port-a-catheter placement were discussed with the patient including, but not limited to bleeding, infection, pneumothorax, or fibrin sheath development and need for additional procedures. All of the patient's questions were answered, patient is agreeable to proceed. Consent signed and in chart.   Thank you for this interesting consult.  I greatly enjoyed meeting NISSI BILL and look forward to participating in their care.  A copy of this report was sent to the requesting provider on this date.  Electronically Signed: Earley Abide, PA-C 05/09/2019, 10:58 AM   I spent a total of 30 Minutes in face to face in clinical consultation, greater than 50% of which was counseling/coordinating care for RCC/Port-a-cath placement.

## 2019-05-09 NOTE — Procedures (Signed)
Pre Procedure Dx: Poor venous access Post Procedural Dx: Same  Successful placement of right IJ approach port-a-cath with tip at the superior caval atrial junction. The catheter is ready for immediate use.  Estimated Blood Loss: Minimal  Complications: None immediate.  Jay Yazid Pop, MD Pager #: [phone]   

## 2019-05-13 MED FILL — INLYTA 5 MG TABLET: 5 | 30 days supply | Qty: 30 | Fill #0

## 2019-05-13 NOTE — Progress Notes (Signed)
Pharmacist Chemotherapy Monitoring - Follow Up Assessment    I verify that I have reviewed each item in the below checklist:  . Regimen for the patient is scheduled for the appropriate day and plan matches scheduled date. [name] Appropriate non-routine labs are ordered dependent on drug ordered. . If applicable, additional medications reviewed and ordered per protocol based on lifetime cumulative doses and/or treatment regimen.   Plan for follow-up and/or issues identified: No . I-vent associated with next due treatment: No . MD and/or nursing notified: No  Philomena Course 05/13/2019 3:14 PM

## 2019-05-17 ENCOUNTER — Other Ambulatory Visit: Payer: Self-pay

## 2019-05-17 ENCOUNTER — Inpatient Hospital Stay: Payer: Medicare Other

## 2019-05-17 ENCOUNTER — Inpatient Hospital Stay: Payer: Medicare Other | Attending: Oncology | Admitting: Oncology

## 2019-05-17 VITALS — BP 161/77 | HR 63 | Temp 98.0°F | Resp 18 | Ht 67.0 in | Wt 207.0 lb

## 2019-05-17 DIAGNOSIS — Z79899 Other long term (current) drug therapy: Secondary | ICD-10-CM | POA: Diagnosis not present

## 2019-05-17 DIAGNOSIS — C649 Malignant neoplasm of unspecified kidney, except renal pelvis: Secondary | ICD-10-CM | POA: Insufficient documentation

## 2019-05-17 DIAGNOSIS — Z5112 Encounter for antineoplastic immunotherapy: Secondary | ICD-10-CM | POA: Diagnosis present

## 2019-05-17 DIAGNOSIS — C641 Malignant neoplasm of right kidney, except renal pelvis: Secondary | ICD-10-CM

## 2019-05-17 DIAGNOSIS — C7800 Secondary malignant neoplasm of unspecified lung: Secondary | ICD-10-CM | POA: Diagnosis not present

## 2019-05-17 LAB — CMP (CANCER CENTER ONLY)
ALT: 10 U/L (ref 0–44)
AST: 16 U/L (ref 15–41)
Albumin: 3.2 g/dL — ABNORMAL LOW (ref 3.5–5.0)
Alkaline Phosphatase: 104 U/L (ref 38–126)
Anion gap: 10 (ref 5–15)
BUN: 16 mg/dL (ref 8–23)
CO2: 25 mmol/L (ref 22–32)
Calcium: 9.8 mg/dL (ref 8.9–10.3)
Chloride: 105 mmol/L (ref 98–111)
Creatinine: 1.26 mg/dL — ABNORMAL HIGH (ref 0.44–1.00)
GFR, Est AFR Am: 48 mL/min — ABNORMAL LOW (ref >60)
GFR, Estimated: 41 mL/min — ABNORMAL LOW (ref >60)
Glucose, Bld: 108 mg/dL — ABNORMAL HIGH (ref 70–99)
Potassium: 5.1 mmol/L (ref 3.5–5.1)
Sodium: 140 mmol/L (ref 135–145)
Total Bilirubin: 0.6 mg/dL (ref 0.3–1.2)
Total Protein: 7.4 g/dL (ref 6.5–8.1)

## 2019-05-17 LAB — CBC WITH DIFFERENTIAL (CANCER CENTER ONLY)
Abs Immature Granulocytes: 0.02 10*3/uL (ref 0.00–0.07)
Basophils Absolute: 0.1 10*3/uL (ref 0.0–0.1)
Basophils Relative: 1 %
Eosinophils Absolute: 0.3 10*3/uL (ref 0.0–0.5)
Eosinophils Relative: 4 %
HCT: 40.5 % (ref 36.0–46.0)
Hemoglobin: 12.4 g/dL (ref 12.0–15.0)
Immature Granulocytes: 0 %
Lymphocytes Relative: 23 %
Lymphs Abs: 1.6 10*3/uL (ref 0.7–4.0)
MCH: 27 pg (ref 26.0–34.0)
MCHC: 30.6 g/dL (ref 30.0–36.0)
MCV: 88 fL (ref 80.0–100.0)
Monocytes Absolute: 0.8 10*3/uL (ref 0.1–1.0)
Monocytes Relative: 11 %
Neutro Abs: 4.4 10*3/uL (ref 1.7–7.7)
Neutrophils Relative %: 61 %
Platelet Count: 181 10*3/uL (ref 150–400)
RBC: 4.6 MIL/uL (ref 3.87–5.11)
RDW: 13.9 % (ref 11.5–15.5)
WBC Count: 7.2 10*3/uL (ref 4.0–10.5)
nRBC: 0 % (ref 0.0–0.2)

## 2019-05-17 LAB — TSH: TSH: 9.67 u[IU]/mL — ABNORMAL HIGH (ref 0.308–3.960)

## 2019-05-17 MED ORDER — SODIUM CHLORIDE 0.9% FLUSH
10.0000 mL | INTRAVENOUS | Status: DC | PRN
  Administered 2019-05-17: 10 mL
  Filled 2019-05-17: qty 10

## 2019-05-17 MED ORDER — SODIUM CHLORIDE 0.9 % IV SOLN
Freq: Once | INTRAVENOUS | Status: AC
  Filled 2019-05-17: qty 250

## 2019-05-17 MED ORDER — HEPARIN SOD (PORK) LOCK FLUSH 100 UNIT/ML IV SOLN
500.0000 [IU] | Freq: Once | INTRAVENOUS | Status: AC | PRN
  Administered 2019-05-17: 500 [IU]
  Filled 2019-05-17: qty 5

## 2019-05-17 MED ORDER — SODIUM CHLORIDE 0.9 % IV SOLN
200.0000 mg | Freq: Once | INTRAVENOUS | Status: AC
  Administered 2019-05-17: 200 mg via INTRAVENOUS
  Filled 2019-05-17: qty 8

## 2019-05-17 NOTE — Progress Notes (Signed)
Hematology and Oncology Follow Up Visit  CACEE FLOWERS [id number] May 17, 1940 79 y.o. 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  She presented with intermediate risk disease and pulmonary involvement.  She presented with localized disease in 2019.   Prior Therapy: She underwent right laparoscopic radical nephrectomy completed by Dr. Alinda Money on December 28, 2017.  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  The final pathological stage was T1b.     Current therapy: Pembrolizumab 200 mg every 3 weeks started on April 25, 2019 with axitinib 5 mg daily.  She is here for cycle 2 of therapy.  Interim History: 79. Amado returns today for a follow-up visit.  Since the last visit, she received a first cycle of treatment without any complications.  She denies any nausea, vomiting or abdominal pain.  He did report some mild constipation.  He denies any shortness of breath difficulty breathing.  Performance status remains marginal although unchanged from her baseline.  She does use a walker for ambulation and did not report any falls or syncope.  Formal status and quality of life unchanged.     Medications: I have reviewed the patient's current medications.  Current Outpatient Medications  Medication Sig Dispense Refill  . acetaminophen (TYLENOL) 500 MG tablet Take 1,000 mg by mouth every 6 (six) hours as needed for moderate pain or headache.     . ALPRAZolam (XANAX) 0.25 MG tablet Take 0.25 mg by mouth 3 (three) times daily as needed for anxiety.  0  . amLODipine (NORVASC) 10 MG tablet Take 1 tablet (10 mg total) by mouth daily. 90 tablet 1  . amoxicillin (AMOXIL) 500 MG tablet Take 4 tablets (2,000 mg total) by mouth as directed. 1 hour prior to dental work including cleanings 12 tablet 12  . aspirin EC  81 MG tablet Take 81 mg by mouth at bedtime.     . calcium elemental as carbonate (BARIATRIC TUMS ULTRA) 400 MG chewable tablet Chew 1,000 mg by mouth 3 (three) times daily as needed for heartburn.    . clobetasol ointment (TEMOVATE) 0.05 % Apply topically.    . diclofenac Sodium (VOLTAREN) 1 % GEL APPLY 4 GRAMS TO AFFECTED AREA THREE TIMES A DAY AS NEEDED FOR RIGHT KNEE PAIN    . FLUoxetine (PROZAC) 40 MG capsule Take 40 mg by mouth every evening.    . furosemide (LASIX) 20 MG tablet Take 20 mg by mouth daily as needed for edema.    . INLYTA 5 MG tablet TAKE 1 TABLET (5 MG TOTAL) BY MOUTH DAILY. 30 tablet 0  . irbesartan (AVAPRO) 300 MG tablet Take 1 tablet (300 mg total) by mouth daily. 90 tablet 2  . levothyroxine (SYNTHROID) 50 MCG tablet Take 1 tablet (50 mcg total) by mouth daily before breakfast. 96 tablet 1  . loperamide (IMODIUM) 2 MG capsule Take 2-4 mg by mouth 4 (four) times daily as needed for diarrhea or loose stools.     . Nebivolol HCl (BYSTOLIC) 20 MG TABS Take 1 tablet (20 mg total) by mouth daily. 30 tablet 11  . nitroGLYCERIN (NITROSTAT) 0.4 MG SL tablet Place 0.4 mg under the tongue every 5 (five) minutes as needed for chest pain (MAX 3 TABLETS).    [name] nystatin (MYCOSTATIN/NYSTOP) powder APPLY SMALL AMOUNT TO AFFECTED AREA THREE TIMES A DAY    .  pantoprazole (PROTONIX) 40 MG tablet TAKE 1 TABLET BY MOUTH EVERY DAY 90 tablet 1  . PROAIR HFA 108 (90 Base) MCG/ACT inhaler Inhale 2 puffs into the lungs every 4 (four) hours as needed for shortness of breath or wheezing.  2  . prochlorperazine (COMPAZINE) 10 MG tablet Take 1 tablet (10 mg total) by mouth every 6 (six) hours as needed for nausea or vomiting. 30 tablet 0  . simethicone (MYLICON) 0000000 MG chewable tablet Chew 125 mg by mouth every 6 (six) hours as needed for flatulence.    . simvastatin (ZOCOR) 40 MG tablet Take 1 tablet (40 mg total) by mouth at bedtime. 90 tablet 3  . SYMBICORT 160-4.5 MCG/ACT inhaler Inhale 1 puff into  the lungs 2 (two) times daily as needed (for respiratory issues.).   2   No current facility-administered medications for this visit.     Allergies:  Allergies  Allergen Reactions  . Chantix [Varenicline Tartrate] Hives  . Dipyridamole Hives and Rash  . Varenicline Hives      Physical Exam: Blood pressure (!) 161/77, pulse 63, temperature 98 F (36.7 C), temperature source Temporal, resp. rate 18, height 5\' 7"  (1.702 m), weight 207 lb (93.9 kg), SpO2 98 %.   ECOG: 1    General appearance: Comfortable appearing without any discomfort Head: Normocephalic without any trauma Oropharynx: Mucous membranes are moist and pink without any thrush or ulcers. Eyes: Pupils are equal and round reactive to light. Lymph nodes: No cervical, supraclavicular, inguinal or axillary lymphadenopathy.   Heart:regular rate and rhythm.  S1 and S2 without leg edema. Lung: Clear without any rhonchi or wheezes.  No dullness to percussion. Abdomin: Soft, nontender, nondistended with good bowel sounds.  No hepatosplenomegaly. Musculoskeletal: No joint deformity or effusion.  Full range of motion noted. Neurological: No deficits noted on motor, sensory and deep tendon reflex exam. Skin: No petechial rash or dryness.  Appeared moist.      Lab Results: Lab Results  Component Value Date   WBC 6.4 05/09/2019   HGB 12.8 05/09/2019   HCT 41.4 05/09/2019   MCV 88.3 05/09/2019   PLT 193 05/09/2019     Chemistry      Component Value Date/Time   NA 137 05/09/2019 1036   NA 139 03/31/2017 1133   K 4.2 05/09/2019 1036   CL 103 05/09/2019 1036   CO2 25 05/09/2019 1036   BUN 22 05/09/2019 1036   BUN 17 03/31/2017 1133   CREATININE 1.20 (H) 05/09/2019 1036   CREATININE 1.36 (H) 04/25/2019 0818   CREATININE 0.62 09/26/2014 1043      Component Value Date/Time   CALCIUM 9.5 05/09/2019 1036   ALKPHOS 79 04/25/2019 0818   AST 15 04/25/2019 0818   ALT 9 04/25/2019 0818   BILITOT 0.5 04/25/2019 0818         Impression and Plan:  79 year old woman with:  1.    Stage IV kidney cancer diagnosed in March 2021.  She presented with localized disease and clear-cell in December 2019.  She has pulmonary metastasis at this time.  The natural course of her disease was reviewed again today.  Potential complication associated with Pembrolizumab and axitinib were reviewed at this time.  She is tolerating therapy reasonably well at this time without any major changes.  She is experiencing mild increase in her blood pressure related to axitinib.  Risks and benefits of continuing these treatments were reviewed and she is agreeable to continue.  We will repeat imaging  studies after cycle 4.  2.  IV access: Port-A-Cath inserted without any complications.  3.  Antiemetics: No nausea or vomiting reported at this time.  Compazine is available to her.  4.  Goals of care and prognosis: Disease is incurable and any therapy remains palliative at this time.  Aggressive measures are warranted given her excellent performance status.  5.  Immune mediated complications: I continue to educate her about potential complications including pneumonitis, colitis and thyroid disease.  6.  Hypertension: We will continue to monitor on axitinib.  We will keep the dose of 5 mg daily for the time being.  6.  Follow-up: In 3 weeks for the next cycle of therapy.  30 minutes were spent on this encounter.  The time was dedicated to updating her disease status, updating treatment complications as well as addressing these complications and cancer related issues.     Zola Button, MD 5/7/20219:10 AM

## 2019-05-17 NOTE — Patient Instructions (Signed)
[facility] Discharge Instructions for Patients Receiving Chemotherapy  Today you received the following chemotherapy agents Pembrolizumab (KEYTRUDA).  To help prevent nausea and vomiting after your treatment, we encourage you to take your nausea medication as prescribed.   If you develop nausea and vomiting that is not controlled by your nausea medication, call the clinic.   BELOW ARE SYMPTOMS THAT SHOULD BE REPORTED IMMEDIATELY:  *FEVER GREATER THAN 100.5 F  *CHILLS WITH OR WITHOUT FEVER  NAUSEA AND VOMITING THAT IS NOT CONTROLLED WITH YOUR NAUSEA MEDICATION  *UNUSUAL SHORTNESS OF BREATH  *UNUSUAL BRUISING OR BLEEDING  TENDERNESS IN MOUTH AND THROAT WITH OR WITHOUT PRESENCE OF ULCERS  *URINARY PROBLEMS  *BOWEL PROBLEMS  UNUSUAL RASH Items with * indicate a potential emergency and should be followed up as soon as possible.  Feel free to call the clinic should you have any questions or concerns. The clinic phone number is [phone].  Please show the CHEMO ALERT CARD at check-in to the Emergency Department and triage nurse.  Pembrolizumab injection What is this medicine? PEMBROLIZUMAB (pem broe liz ue mab) is a monoclonal antibody. It is used to treat certain types of cancer. This medicine may be used for other purposes; ask your health care provider or pharmacist if you have questions. COMMON BRAND NAME(S): Keytruda What should I tell my health care provider before I take this medicine? They need to know if you have any of these conditions:  diabetes  immune system problems  inflammatory bowel disease  liver disease  lung or breathing disease  lupus  received or scheduled to receive an organ transplant or a stem-cell transplant that uses donor stem cells  an unusual or allergic reaction to pembrolizumab, other medicines, foods, dyes, or preservatives  pregnant or trying to get pregnant  breast-feeding How should I use this  medicine? This medicine is for infusion into a vein. It is given by a health care professional in a hospital or clinic setting. A special MedGuide will be given to you before each treatment. Be sure to read this information carefully each time. Talk to your pediatrician regarding the use of this medicine in children. While this drug may be prescribed for children as young as 6 months for selected conditions, precautions do apply. Overdosage: If you think you have taken too much of this medicine contact a poison control center or emergency room at once. NOTE: This medicine is only for you. Do not share this medicine with others. What if I miss a dose? It is important not to miss your dose. Call your doctor or health care professional if you are unable to keep an appointment. What may interact with this medicine? Interactions have not been studied. Give your health care provider a list of all the medicines, herbs, non-prescription drugs, or dietary supplements you use. Also tell them if you smoke, drink alcohol, or use illegal drugs. Some items may interact with your medicine. This list may not describe all possible interactions. Give your health care provider a list of all the medicines, herbs, non-prescription drugs, or dietary supplements you use. Also tell them if you smoke, drink alcohol, or use illegal drugs. Some items may interact with your medicine. What should I watch for while using this medicine? Your condition will be monitored carefully while you are receiving this medicine. You may need blood work done while you are taking this medicine. Do not become pregnant while taking this medicine or for 4 months after stopping it. Women   should inform their doctor if they wish to become pregnant or think they might be pregnant. There is a potential for serious side effects to an unborn child. Talk to your health care professional or pharmacist for more information. Do not breast-feed an infant while  taking this medicine or for 4 months after the last dose. What side effects may I notice from receiving this medicine? Side effects that you should report to your doctor or health care professional as soon as possible:  allergic reactions like skin rash, itching or hives, swelling of the face, lips, or tongue  bloody or black, tarry  breathing problems  changes in vision  chest pain  chills  confusion  constipation  cough  diarrhea  dizziness or feeling faint or lightheaded  fast or irregular heartbeat  fever  flushing  joint pain  low blood counts - this medicine may decrease the number of white blood cells, red blood cells and platelets. You may be at increased risk for infections and bleeding.  muscle pain  muscle weakness  pain, tingling, numbness in the hands or feet  persistent headache  redness, blistering, peeling or loosening of the skin, including inside the mouth  signs and symptoms of high blood sugar such as dizziness; dry mouth; dry skin; fruity breath; nausea; stomach pain; increased hunger or thirst; increased urination  signs and symptoms of kidney injury like trouble passing urine or change in the amount of urine  signs and symptoms of liver injury like dark urine, light-colored stools, loss of appetite, nausea, right upper belly pain, yellowing of the eyes or skin  sweating  swollen lymph nodes  weight loss Side effects that usually do not require medical attention (report to your doctor or health care professional if they continue or are bothersome):  decreased appetite  hair loss  muscle pain  tiredness This list may not describe all possible side effects. Call your doctor for medical advice about side effects. You may report side effects to FDA at 1-800-FDA-1088. Where should I keep my medicine? This drug is given in a hospital or clinic and will not be stored at home. NOTE: This sheet is a summary. It may not cover all  possible information. If you have questions about this medicine, talk to your doctor, pharmacist, or health care provider.  2020 Elsevier/Gold Standard (2018-11-02 18:07:58)  Coronavirus (COVID-19) Are you at risk?  Are you at risk for the Coronavirus (COVID-19)?  To be considered HIGH RISK for Coronavirus (COVID-19), you have to meet the following criteria:  . Traveled to [place], [place], [place], [place] or [place]; or in the [place] to [place], [place], [place], or [place]; and have fever, cough, and shortness of breath within the last 2 weeks of travel OR . Been in close contact with a person diagnosed with COVID-19 within the last 2 weeks and have fever, cough, and shortness of breath . IF YOU DO NOT MEET THESE CRITERIA, YOU ARE CONSIDERED LOW RISK FOR COVID-19.  What to do if you are HIGH RISK for COVID-19?  [name] If you are having a medical emergency, call 911. . Seek medical care right away. Before you go to a doctor's office, urgent care or emergency department, call ahead and tell them about your recent travel, contact with someone diagnosed with COVID-19, and your symptoms. You should receive instructions from your physician's office regarding next steps of care.  . When you arrive at healthcare provider, tell the healthcare staff immediately you  have returned from visiting [place], [place], [place], [place] or [place]; or traveled in the [place] to [place], [place], [place], or [place]; in the last two weeks or you have been in close contact with a person diagnosed with COVID-19 in the last 2 weeks.   . Tell the health care staff about your symptoms: fever, cough and shortness of breath. . After you have been seen by a medical provider, you will be either: o Tested for (COVID-19) and discharged home on quarantine except to seek medical care if symptoms worsen, and asked to  - Stay home and avoid contact with others until you get your results (4-5 days)  - Avoid  travel on public transportation if possible (such as bus, train, or airplane) or o Sent to the Emergency Department by EMS for evaluation, COVID-19 testing, and possible admission depending on your condition and test results.  What to do if you are LOW RISK for COVID-19?  Reduce your risk of any infection by using the same precautions used for avoiding the common cold or flu:  [name] Wash your hands often with soap and warm water for at least 20 seconds.  If soap and water are not readily available, use an alcohol-based hand sanitizer with at least 60% alcohol.  . If coughing or sneezing, cover your mouth and nose by coughing or sneezing into the elbow areas of your shirt or coat, into a tissue or into your sleeve (not your hands). . Avoid shaking hands with others and consider head nods or verbal greetings only. . Avoid touching your eyes, nose, or mouth with unwashed hands.  . Avoid close contact with people who are sick. . Avoid places or events with large numbers of people in one location, like concerts or sporting events. . Carefully consider travel plans you have or are making. . If you are planning any travel outside or inside the [place], visit the CDC's Travelers' Health webpage for the latest health notices. . If you have some symptoms but not all symptoms, continue to monitor at home and seek medical attention if your symptoms worsen. . If you are having a medical emergency, call 911.   [place] / e-Visit: [email or web address]         [facility]: [facility]: [id number]                   [facility]: [phone]

## 2019-05-29 ENCOUNTER — Telehealth: Payer: Self-pay | Admitting: Oncology

## 2019-05-29 NOTE — Telephone Encounter (Signed)
Scheduled per los, called patient and left a voicemail. 

## 2019-06-02 ENCOUNTER — Emergency Department (HOSPITAL_COMMUNITY): Payer: Medicare Other

## 2019-06-02 ENCOUNTER — Other Ambulatory Visit: Payer: Self-pay

## 2019-06-02 ENCOUNTER — Inpatient Hospital Stay (HOSPITAL_COMMUNITY)
Admission: EM | Admit: 2019-06-02 | Discharge: 2019-06-05 | DRG: 689 | Disposition: A | Discharge status: Home Health | Payer: Medicare Other | Attending: Family Medicine | Admitting: Family Medicine

## 2019-06-02 ENCOUNTER — Encounter (HOSPITAL_COMMUNITY): Payer: Self-pay | Admitting: Emergency Medicine

## 2019-06-02 DIAGNOSIS — I130 Hypertensive heart and chronic kidney disease with heart failure and stage 1 through stage 4 chronic kidney disease, or unspecified chronic kidney disease: Secondary | ICD-10-CM | POA: Diagnosis present

## 2019-06-02 DIAGNOSIS — Z7951 Long term (current) use of inhaled steroids: Secondary | ICD-10-CM

## 2019-06-02 DIAGNOSIS — E669 Obesity, unspecified: Secondary | ICD-10-CM | POA: Diagnosis present

## 2019-06-02 DIAGNOSIS — I444 Left anterior fascicular block: Secondary | ICD-10-CM | POA: Diagnosis present

## 2019-06-02 DIAGNOSIS — I5033 Acute on chronic diastolic (congestive) heart failure: Secondary | ICD-10-CM | POA: Diagnosis present

## 2019-06-02 DIAGNOSIS — E039 Hypothyroidism, unspecified: Secondary | ICD-10-CM | POA: Diagnosis present

## 2019-06-02 DIAGNOSIS — Z833 Family history of diabetes mellitus: Secondary | ICD-10-CM

## 2019-06-02 DIAGNOSIS — K219 Gastro-esophageal reflux disease without esophagitis: Secondary | ICD-10-CM | POA: Diagnosis present

## 2019-06-02 DIAGNOSIS — C7802 Secondary malignant neoplasm of left lung: Secondary | ICD-10-CM | POA: Diagnosis present

## 2019-06-02 DIAGNOSIS — C641 Malignant neoplasm of right kidney, except renal pelvis: Secondary | ICD-10-CM | POA: Diagnosis not present

## 2019-06-02 DIAGNOSIS — J9601 Acute respiratory failure with hypoxia: Secondary | ICD-10-CM | POA: Diagnosis not present

## 2019-06-02 DIAGNOSIS — Z823 Family history of stroke: Secondary | ICD-10-CM

## 2019-06-02 DIAGNOSIS — N179 Acute kidney failure, unspecified: Secondary | ICD-10-CM | POA: Diagnosis present

## 2019-06-02 DIAGNOSIS — I2510 Atherosclerotic heart disease of native coronary artery without angina pectoris: Secondary | ICD-10-CM | POA: Diagnosis present

## 2019-06-02 DIAGNOSIS — Z905 Acquired absence of kidney: Secondary | ICD-10-CM | POA: Diagnosis not present

## 2019-06-02 DIAGNOSIS — Z85528 Personal history of other malignant neoplasm of kidney: Secondary | ICD-10-CM

## 2019-06-02 DIAGNOSIS — Z888 Allergy status to other drugs, medicaments and biological substances status: Secondary | ICD-10-CM

## 2019-06-02 DIAGNOSIS — F411 Generalized anxiety disorder: Secondary | ICD-10-CM | POA: Diagnosis present

## 2019-06-02 DIAGNOSIS — I5032 Chronic diastolic (congestive) heart failure: Secondary | ICD-10-CM

## 2019-06-02 DIAGNOSIS — I739 Peripheral vascular disease, unspecified: Secondary | ICD-10-CM | POA: Diagnosis present

## 2019-06-02 DIAGNOSIS — Z79890 Hormone replacement therapy (postmenopausal): Secondary | ICD-10-CM

## 2019-06-02 DIAGNOSIS — N390 Urinary tract infection, site not specified: Secondary | ICD-10-CM | POA: Diagnosis not present

## 2019-06-02 DIAGNOSIS — Z20822 Contact with and (suspected) exposure to covid-19: Secondary | ICD-10-CM | POA: Diagnosis present

## 2019-06-02 DIAGNOSIS — C649 Malignant neoplasm of unspecified kidney, except renal pelvis: Secondary | ICD-10-CM | POA: Diagnosis present

## 2019-06-02 DIAGNOSIS — Z953 Presence of xenogenic heart valve: Secondary | ICD-10-CM | POA: Diagnosis not present

## 2019-06-02 DIAGNOSIS — Z9049 Acquired absence of other specified parts of digestive tract: Secondary | ICD-10-CM

## 2019-06-02 DIAGNOSIS — N1832 Chronic kidney disease, stage 3b: Secondary | ICD-10-CM | POA: Diagnosis present

## 2019-06-02 DIAGNOSIS — Z951 Presence of aortocoronary bypass graft: Secondary | ICD-10-CM | POA: Diagnosis not present

## 2019-06-02 DIAGNOSIS — J449 Chronic obstructive pulmonary disease, unspecified: Secondary | ICD-10-CM | POA: Diagnosis present

## 2019-06-02 DIAGNOSIS — Z720 Tobacco use: Secondary | ICD-10-CM | POA: Diagnosis present

## 2019-06-02 DIAGNOSIS — N3000 Acute cystitis without hematuria: Secondary | ICD-10-CM

## 2019-06-02 DIAGNOSIS — E785 Hyperlipidemia, unspecified: Secondary | ICD-10-CM | POA: Diagnosis present

## 2019-06-02 DIAGNOSIS — Z79899 Other long term (current) drug therapy: Secondary | ICD-10-CM

## 2019-06-02 DIAGNOSIS — Z87440 Personal history of urinary (tract) infections: Secondary | ICD-10-CM

## 2019-06-02 DIAGNOSIS — R0902 Hypoxemia: Secondary | ICD-10-CM | POA: Insufficient documentation

## 2019-06-02 DIAGNOSIS — G9341 Metabolic encephalopathy: Secondary | ICD-10-CM | POA: Diagnosis present

## 2019-06-02 DIAGNOSIS — I350 Nonrheumatic aortic (valve) stenosis: Secondary | ICD-10-CM

## 2019-06-02 DIAGNOSIS — Z8719 Personal history of other diseases of the digestive system: Secondary | ICD-10-CM

## 2019-06-02 DIAGNOSIS — F329 Major depressive disorder, single episode, unspecified: Secondary | ICD-10-CM | POA: Diagnosis present

## 2019-06-02 DIAGNOSIS — Z9221 Personal history of antineoplastic chemotherapy: Secondary | ICD-10-CM

## 2019-06-02 DIAGNOSIS — F17210 Nicotine dependence, cigarettes, uncomplicated: Secondary | ICD-10-CM | POA: Diagnosis present

## 2019-06-02 DIAGNOSIS — G9340 Encephalopathy, unspecified: Secondary | ICD-10-CM | POA: Diagnosis present

## 2019-06-02 DIAGNOSIS — Z6831 Body mass index (BMI) 31.0-31.9, adult: Secondary | ICD-10-CM

## 2019-06-02 DIAGNOSIS — F419 Anxiety disorder, unspecified: Secondary | ICD-10-CM

## 2019-06-02 DIAGNOSIS — J438 Other emphysema: Secondary | ICD-10-CM

## 2019-06-02 DIAGNOSIS — B9620 Unspecified Escherichia coli [E. coli] as the cause of diseases classified elsewhere: Secondary | ICD-10-CM | POA: Diagnosis present

## 2019-06-02 DIAGNOSIS — Z800 Family history of malignant neoplasm of digestive organs: Secondary | ICD-10-CM

## 2019-06-02 DIAGNOSIS — Z9884 Bariatric surgery status: Secondary | ICD-10-CM

## 2019-06-02 DIAGNOSIS — Z7982 Long term (current) use of aspirin: Secondary | ICD-10-CM

## 2019-06-02 HISTORY — DX: Malignant (primary) neoplasm, unspecified: C80.1

## 2019-06-02 LAB — POCT I-STAT EG7
Acid-Base Excess: 0 mmol/L (ref 0.0–2.0)
Bicarbonate: 25.6 mmol/L (ref 20.0–28.0)
Calcium, Ion: 1.26 mmol/L (ref 1.15–1.40)
HCT: 38 % (ref 36.0–46.0)
Hemoglobin: 12.9 g/dL (ref 12.0–15.0)
O2 Saturation: 99 %
Potassium: 4.2 mmol/L (ref 3.5–5.1)
Sodium: 135 mmol/L (ref 135–145)
TCO2: 27 mmol/L (ref 22–32)
pCO2, Ven: 45 mmHg (ref 44.0–60.0)
pH, Ven: 7.363 (ref 7.250–7.430)
pO2, Ven: 133 mmHg — ABNORMAL HIGH (ref 32.0–45.0)

## 2019-06-02 LAB — CBC WITH DIFFERENTIAL/PLATELET
Abs Immature Granulocytes: 0.02 10*3/uL (ref 0.00–0.07)
Basophils Absolute: 0.1 10*3/uL (ref 0.0–0.1)
Basophils Relative: 1 %
Eosinophils Absolute: 0.2 10*3/uL (ref 0.0–0.5)
Eosinophils Relative: 3 %
HCT: 40.3 % (ref 36.0–46.0)
Hemoglobin: 12.4 g/dL (ref 12.0–15.0)
Immature Granulocytes: 0 %
Lymphocytes Relative: 20 %
Lymphs Abs: 1.6 10*3/uL (ref 0.7–4.0)
MCH: 26.6 pg (ref 26.0–34.0)
MCHC: 30.8 g/dL (ref 30.0–36.0)
MCV: 86.3 fL (ref 80.0–100.0)
Monocytes Absolute: 0.6 10*3/uL (ref 0.1–1.0)
Monocytes Relative: 8 %
Neutro Abs: 5.4 10*3/uL (ref 1.7–7.7)
Neutrophils Relative %: 68 %
Platelets: 173 10*3/uL (ref 150–400)
RBC: 4.67 MIL/uL (ref 3.87–5.11)
RDW: 14.2 % (ref 11.5–15.5)
WBC: 7.8 10*3/uL (ref 4.0–10.5)
nRBC: 0 % (ref 0.0–0.2)

## 2019-06-02 LAB — AMMONIA: Ammonia: 22 umol/L (ref 9–35)

## 2019-06-02 LAB — COMPREHENSIVE METABOLIC PANEL
ALT: 14 U/L (ref 0–44)
AST: 19 U/L (ref 15–41)
Albumin: 3.1 g/dL — ABNORMAL LOW (ref 3.5–5.0)
Alkaline Phosphatase: 71 U/L (ref 38–126)
Anion gap: 10 (ref 5–15)
BUN: 21 mg/dL (ref 8–23)
CO2: 22 mmol/L (ref 22–32)
Calcium: 9.6 mg/dL (ref 8.9–10.3)
Chloride: 103 mmol/L (ref 98–111)
Creatinine, Ser: 1.39 mg/dL — ABNORMAL HIGH (ref 0.44–1.00)
GFR calc Af Amer: 43 mL/min — ABNORMAL LOW (ref >60)
GFR calc non Af Amer: 37 mL/min — ABNORMAL LOW (ref >60)
Glucose, Bld: 111 mg/dL — ABNORMAL HIGH (ref 70–99)
Potassium: 4.4 mmol/L (ref 3.5–5.1)
Sodium: 135 mmol/L (ref 135–145)
Total Bilirubin: 1.4 mg/dL — ABNORMAL HIGH (ref 0.3–1.2)
Total Protein: 6.5 g/dL (ref 6.5–8.1)

## 2019-06-02 LAB — URINALYSIS, ROUTINE W REFLEX MICROSCOPIC
Bilirubin Urine: NEGATIVE
Glucose, UA: NEGATIVE mg/dL
Ketones, ur: NEGATIVE mg/dL
Nitrite: NEGATIVE
Protein, ur: NEGATIVE mg/dL
Specific Gravity, Urine: 1.011 (ref 1.005–1.030)
pH: 5 (ref 5.0–8.0)

## 2019-06-02 LAB — LACTIC ACID, PLASMA: Lactic Acid, Venous: 1 mmol/L (ref 0.5–1.9)

## 2019-06-02 LAB — SARS CORONAVIRUS 2 BY RT PCR (HOSPITAL ORDER, PERFORMED IN ~~LOC~~ HOSPITAL LAB): SARS Coronavirus 2: NEGATIVE

## 2019-06-02 LAB — BRAIN NATRIURETIC PEPTIDE: B Natriuretic Peptide: 159.1 pg/mL — ABNORMAL HIGH (ref 0.0–100.0)

## 2019-06-02 LAB — CBG MONITORING, ED: Glucose-Capillary: 90 mg/dL (ref 70–99)

## 2019-06-02 MED ORDER — SODIUM CHLORIDE 0.9 % IV SOLN
1.0000 g | Freq: Once | INTRAVENOUS | Status: AC
  Administered 2019-06-02: 1 g via INTRAVENOUS
  Filled 2019-06-02: qty 10

## 2019-06-02 MED ORDER — FUROSEMIDE 10 MG/ML IJ SOLN
40.0000 mg | Freq: Once | INTRAMUSCULAR | Status: AC
  Administered 2019-06-03: 40 mg via INTRAVENOUS
  Filled 2019-06-02: qty 4

## 2019-06-02 NOTE — ED Notes (Signed)
Pt transported to CT ?

## 2019-06-02 NOTE — ED Notes (Signed)
Pt ambulated to bathroom to obtain urine sample, pt missed cup.

## 2019-06-02 NOTE — ED Notes (Signed)
Carmell Austria daughter [id number] looking for an update on pt

## 2019-06-02 NOTE — ED Provider Notes (Addendum)
[facility] EMERGENCY DEPARTMENT Provider Note   CSN: [id number] Arrival date & time: 06/02/19  [id number]     History Chief Complaint  Patient presents with  . Weakness  . Confusion    [name] is a very pleasant 79 y.o. female who presents emergency department with confusion.  Has a past medical history of renal cancer status post right sided nephrectomy with recurrent cancer in the lung, history of tobacco abuse, CAD, previous endarterectomy, CABG, TAVR, history of hypertension and hypothyroidism.  There is a level 5 caveat due to confusion.  Patient states that when she woke up this morning she was not feeling well.  Yesterday she was having some mild nausea.  Today she continued having nausea and realized that she could not remember what day it was.  She knew she was confused.  She decided to go back to sleep and see if things resolved however when she awoke she was still feeling very confused.  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  Patient states that she is going to call her daughter to pick her up but then thought she might be having a heart attack so decided take an ambulance.  She denies any burning with urination, frequency, urgency.  She does have a history of previous urinary tract infections but states she does not get them frequently.  She denies abdominal pain, chest pain, shortness of breath.  Notably the patient is hypoxic.  She does not think that she is on any kind of oxygen at home.  She denies cough.  She denies fever or chills.  He states he did have one episode of diarrhea yesterday  HPI     Past Medical History:  Diagnosis Date  . Abdominal aortic aneurysm ([place])    infrarenal diagnosed with maximum measurement at 3.3 cm  . Cancer ([place])   . Coronary artery disease   . Depression   . Diverticulosis   . GERD (gastroesophageal reflux disease)   . History of tobacco abuse    Recurrent cigarette smoking  . Hyperlipidemia   .  Hyperplastic colon polyp   . Hypertension   . Hypothyroidism   . Ischemic heart disease    had redo bypass surgery in 2001, at that time she had a free right internal mammary to the LAD, LAD endarterectomy, sequential vein graft to the OM1 and OM2 -- last catheterization was in 2008  . Leg fracture    remote right leg fracture  . Microscopic colitis   . Obesity    with gastric bypass surgery in 2004  . PAD (peripheral artery disease) (H[facility])    right iliac occlusion  . PONV (postoperative nausea and vomiting)   . Right kidney mass   . S/P CABG x 4 1985   LIMA to LAD, Sequential SVG to OM1-OM2, SVG to RCA - Dr Redmond Pulling  . S/P redo CABG x 3 2001   Free RIMA to LAD with endarterectomy, sequential SVG to OM1-OM2 - Dr Servando Snare  . 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 Goals of care, counseling/discussion 04/11/2019  . Severe sepsis ([place])   . Macrocytic anemia   . Abdominal wall abscess at site of surgical wound 01/11/2018  . Hypothyroidism 01/11/2018  . Anxiety 01/11/2018  . COPD (chronic obstructive pulmonary disease) (  [place]) 01/11/2018  . AKI (acute kidney injury) ([place]) 01/11/2018  . Chronic diastolic CHF (congestive heart failure) ([place]) 01/11/2018  . Renal cell carcinoma ([place]) 01/11/2018  . Neoplasm of right kidney 12/28/2017  . Severe aortic stenosis 05/09/2017  . 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 GERD (gastroesophageal reflux disease) 10/19/2016  . 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 Carotid arterial disease ([place]) 05/28/2015  . Bilateral carotid bruits 01/16/2015  . Reflux esophagitis   . Hiatal hernia   . Status post bariatric surgery   . Chronic diarrhea   . Diverticulosis of colon without hemorrhage   . PAD (peripheral artery disease) ([place]) 04/13/2011  . Aortic  stenosis, severe 09/10/2010  . CAD (coronary artery disease) 08/11/2010  . HTN (hypertension) 08/11/2010  . Obesities, morbid ([place]) 08/11/2010  . Tobacco abuse 08/11/2010  . Abdominal aortic aneurysm ([place]) 12/28/2007  . DIVERTICULOSIS, COLON 12/28/2007  . FATTY LIVER DISEASE 12/28/2007  . ANEMIA, HX OF 12/28/2007  . NAUSEA 12/12/2007  . ABDOMINAL PAIN, EPIGASTRIC 12/12/2007  . S/P redo CABG x 3 03/09/1999  . S/P CABG x 4 04/19/1983    Past Surgical History:  Procedure Laterality Date  . ARTERIAL LINE INSERTION Right 05/09/2017   Procedure: ARTERIAL LINE  INSERTION  RIGHT RADIAL ARTERY;  Surgeon: Sherren Mocha, MD;  Location: [place];  Service: Open Heart Surgery;  Laterality: Right;  . CARDIAC CATHETERIZATION  02/16/1999   Left heart catheterization with selective coronary  angiography, left ventricular angiography (RAO and LAO views), angiography of left internal mammary artery, and angiography of saphenous vein grafts  . CARDIAC CATHETERIZATION  10/20/2006   Est. EF of 55% -- Totally occuladed native coronary circulation -- Persistent patency of the right mammary graft and the distal LAD artery with peristent patency of the left mammary graft to the proximal LAD artery with persistant patency of the saphenous vein graft to the sequentail branches of the OM        . CARPAL TUNNEL RELEASE    . CHOLECYSTECTOMY    . COLONOSCOPY  07/12/2007   Dr.Brodie- normal cecum and rectum, diverticulosis in the sigmoid colon  . COLONOSCOPY  1991   per Dr.Brodie office visit note= hyperplastic polyp  . COLONOSCOPY N/A 09/30/2014   Dr.Rourk- noraml appearing rectal mucosa, scattered L sided diverticula, colonic mucosa has a somewhat pale friable appearence diffusely, no ulcers or erosions seen, the distal 10cm of terminal ileum appeared entirely normal. bx=benign colorectal mucosa with lymphocytic colitis  . CORONARY ARTERY BYPASS GRAFT  1985  . CORONARY ARTERY BYPASS GRAFT  03/09/1999   Redo CABG x3 --   with the free right internal mammary artey to the LAD with an LAD endarterectomy -- Sequential saphenous vein graft to OM1 and OM2                   . CYSTOSCOPY/RETROGRADE/URETEROSCOPY Right 12/21/2017   Procedure: [id number];  Surgeon: Raynelle Bring, MD;  Location: [facility];  Service: Urology;  Laterality: Right;  . ESOPHAGOGASTRODUODENOSCOPY N/A 09/30/2014   Dr.Rourk-mild erosive reflux esophagitis s/p bariatric surgery  . EYE SURGERY     catracts  . GASTRIC BYPASS  2004  . INCISION AND DRAINAGE ABSCESS N/A 01/11/2018   Procedure: INCISION AND DRAINAGE ABSCESS;  Surgeon: Raynelle Bring, MD;  Location: [facility];  Service: Urology;  Laterality: N/A;  . IR IMAGING GUIDED PORT INSERTION  05/09/2019  . LAPAROSCOPIC NEPHRECTOMY Right 12/28/2017   Procedure: LAPAROSCOPIC RADICAL NEPHRECTOMY;  Surgeon: Raynelle Bring, MD;  Location: [facility];  Service: Urology;  Laterality: Right;  . RIGHT/LEFT HEART CATH AND CORONARY/GRAFT ANGIOGRAPHY N/A 04/04/2017   Procedure: RIGHT/LEFT HEART CATH AND CORONARY/GRAFT ANGIOGRAPHY;  Surgeon: Martinique, Peter M, MD;  Location: [facility];  Service: Cardiovascular;  Laterality: N/A;  . TEE WITHOUT CARDIOVERSION N/A 05/09/2017   Procedure: TRANSESOPHAGEAL ECHOCARDIOGRAM (TEE);  Surgeon: Sherren Mocha, MD;  Location: [place];  Service: Open Heart Surgery;  Laterality: N/A;  . TRANSCATHETER AORTIC VALVE REPLACEMENT, TRANSFEMORAL N/A 05/09/2017   Procedure: TRANSCATHETER AORTIC VALVE REPLACEMENT, TRANSFEMORAL using a 64mm Edwards Sapien 3 Aortic Valve;  Surgeon: Sherren Mocha, MD;  Location: [place];  Service: Open Heart Surgery;  Laterality: N/A;     OB History   No obstetric history on file.     Family History  Problem Relation Age of Onset  . Stroke Father   . Diabetes Mother   . Pancreatic cancer Mother     Social History   Tobacco Use  . Smoking status: Current Some Day Smoker    Packs/day: 0.50    Types: Cigarettes    Start date: 01/10/1985     Last attempt to quit: 02/11/2016    Years since quitting: 3.3  . Smokeless tobacco: Never Used  . Tobacco comment: smokes 1-2 cigarettes a day  Substance Use Topics  . Alcohol use: Yes    Alcohol/week: 0.0 standard drinks    Comment: rarely wine  . Drug use: No    Home Medications Prior to Admission medications   Medication Sig Start Date End Date Taking? Authorizing Provider  acetaminophen (TYLENOL) 500 MG tablet Take 1,000 mg by mouth every 6 (six) hours as needed for moderate pain or headache.     [provider]  ALPRAZolam Duanne Moron) 0.25 MG tablet Take 0.25 mg by mouth 3 (three) times daily as needed for anxiety. 03/20/17   [provider]  amLODipine (NORVASC) 10 MG tablet Take 1 tablet (10 mg total) by mouth daily. 03/22/19   Martinique, Peter M, MD  amoxicillin (AMOXIL) 500 MG tablet Take 4 tablets (2,000 mg total) by mouth as directed. 1 hour prior to dental work including cleanings 04/27/18   Eileen Stanford, PA-C  aspirin EC 81 MG tablet Take 81 mg by mouth at bedtime.     [provider]  calcium elemental as carbonate (BARIATRIC TUMS ULTRA) 400 MG chewable tablet Chew 1,000 mg by mouth 3 (three) times daily as needed for heartburn.    [provider]  clobetasol ointment (TEMOVATE) 0.05 % Apply topically. 04/03/19 04/02/20  [provider]  diclofenac Sodium (VOLTAREN) 1 % GEL APPLY 4 GRAMS TO AFFECTED AREA THREE TIMES A DAY AS NEEDED FOR RIGHT KNEE PAIN 07/31/18   [provider]  FLUoxetine (PROZAC) 40 MG capsule Take 40 mg by mouth every evening.    [provider]  furosemide (LASIX) 20 MG tablet Take 20 mg by mouth daily as needed for edema.    [provider]  INLYTA 5 MG tablet TAKE 1 TABLET (5 MG TOTAL) BY MOUTH DAILY. 05/07/19   Wyatt Portela, MD  irbesartan (AVAPRO) 300 MG tablet Take 1 tablet (300 mg total) by mouth daily. 12/24/18   Martinique, Peter M, MD  levothyroxine (SYNTHROID) 50 MCG tablet Take 1  tablet (50 mcg total) by mouth daily before breakfast. 09/19/18   [place]ique, Peter M, MD  loperamide (IMODIUM) 2 MG capsule Take 2-4 mg by mouth 4 (four) times daily as needed for diarrhea or loose stools.  [provider]  Nebivolol HCl (BYSTOLIC) 20 MG TABS Take 1 tablet (20 mg total) by mouth daily. 02/22/19   Martinique, Peter M, MD  nitroGLYCERIN (NITROSTAT) 0.4 MG SL tablet Place 0.4 mg under the tongue every 5 (five) minutes as needed for chest pain (MAX 3 TABLETS).    [provider]  nystatin (MYCOSTATIN/NYSTOP) powder APPLY SMALL AMOUNT TO AFFECTED AREA THREE TIMES A DAY 12/12/18   [provider]  pantoprazole (PROTONIX) 40 MG tablet TAKE 1 TABLET BY MOUTH EVERY DAY 02/20/19   Annitta Needs, NP  PROAIR HFA 108 [phone] Base) MCG/ACT inhaler Inhale 2 puffs into the lungs every 4 (four) hours as needed for shortness of breath or wheezing. 03/14/17   [provider]  prochlorperazine (COMPAZINE) 10 MG tablet Take 1 tablet (10 mg total) by mouth every 6 (six) hours as needed for nausea or vomiting. 04/11/19   Wyatt Portela, MD  simethicone (MYLICON) 0000000 MG chewable tablet Chew 125 mg by mouth every 6 (six) hours as needed for flatulence.    [provider]  simvastatin (ZOCOR) 40 MG tablet Take 1 tablet (40 mg total) by mouth at bedtime. 03/18/19   Martinique, Peter M, MD  SYMBICORT 160-4.5 MCG/ACT inhaler Inhale 1 puff into the lungs 2 (two) times daily as needed (for respiratory issues.).  03/14/17   [provider]    Allergies    Chantix [varenicline tartrate], Dipyridamole, and Varenicline  Review of Systems   Review of Systems  Unable to perform ROS: Mental status change    Physical Exam Updated Vital Signs BP (!) 145/76   Pulse 65   Temp 98 F (36.7 C) (Oral)   Resp (!) 21   SpO2 92%   Physical Exam Vitals and nursing note reviewed.  Constitutional:      General: She is not in acute distress.    Appearance: She is well-developed. She is  not diaphoretic.  HENT:     Head: Normocephalic and atraumatic.  Eyes:     General: No scleral icterus.    Conjunctiva/sclera: Conjunctivae normal.  Cardiovascular:     Rate and Rhythm: Normal rate and regular rhythm.     Heart sounds: Normal heart sounds. No murmur. No friction rub. No gallop.   Pulmonary:     Effort: Pulmonary effort is normal. No respiratory distress.     Breath sounds: Examination of the right-middle field reveals rales. Examination of the right-lower field reveals rales. Rales present.     Comments: Patient 02 87-93%. Placed on 2 L [place] Abdominal:     General: Bowel sounds are normal. There is no distension.     Palpations: Abdomen is soft. There is no mass.     Tenderness: There is no abdominal tenderness. There is no guarding.  Musculoskeletal:     Cervical back: Normal range of motion.  Skin:    General: Skin is warm and dry.  Neurological:     General: No focal deficit present.     Mental Status: She is alert. She is confused.     Comments: Oriented to place Time:2014 Month:February  Psychiatric:        Behavior: Behavior normal.     ED Results / Procedures / Treatments   Labs (all labs ordered are listed, but only abnormal results are displayed) Labs Reviewed  SARS CORONAVIRUS 2 BY RT PCR (HOSPITAL ORDER, [facility])  CBC WITH DIFFERENTIAL/PLATELET  COMPREHENSIVE METABOLIC PANEL  URINALYSIS, ROUTINE W  REFLEX MICROSCOPIC  AMMONIA  LACTIC ACID, PLASMA  BLOOD GAS, VENOUS  CBG MONITORING, ED    EKG EKG Interpretation  Date/Time:  Sunday Jun 02 2019 18:56:21 EDT Ventricular Rate:  66 PR Interval:    QRS Duration: 105 QT Interval:  411 QTC Calculation: 431 R Axis:   -50 Text Interpretation: Sinus rhythm Left anterior fascicular block Low voltage, precordial leads Probable anteroseptal infarct, old No significant change since last tracing Confirmed by Isla Pence [phone]) on 06/02/2019 7:02:55 PM   Radiology DG  Chest Port 1 View  Result Date: 06/02/2019 CLINICAL DATA:  Shortness of breath. Hypoxia. EXAM: PORTABLE CHEST 1 VIEW COMPARISON:  January 10, 2018 FINDINGS: The heart size is enlarged. The patient is status post prior TAVR. There is a well-positioned right-sided Port-A-Cath in place. The patient is status post prior median sternotomy. There is vascular congestion with mild pulmonary edema. There is stable elevation of the right hemidiaphragm. There is no large focal infiltrate or pleural effusion. IMPRESSION: 1. Cardiomegaly with mild pulmonary edema. 2. Stable elevation of the right hemidiaphragm. 3. No large focal infiltrate or pleural effusion. Electronically Signed   By: Constance Holster M.D.   On: 06/02/2019 19:19    Procedures Procedures (including critical care time)  Medications Ordered in ED Medications - No data to display  ED Course  I have reviewed the triage vital signs and the nursing notes.  Pertinent labs & imaging results that were available during my care of the patient were reviewed by me and considered in my medical decision making (see chart for details).    MDM Rules/Calculators/A&P                      CC:ams VS: BP 131/67 (BP Location: Right Arm)   Pulse (!) 57   Temp 98 F (36.7 C) (Oral)   Resp 18   Ht 5' 7.5" (1.715 m)   Wt 93.4 kg   SpO2 93%   BMI 31.79 kg/m  [id number] is gathered by patient and daughter . Previous records obtained and reviewed. DDX:The patient's complaint of ams involves an extensive number of diagnostic and treatment options, and is a complaint that carries with it a high risk of complications, morbidity, and potential mortality. Given the large differential diagnosis, medical decision making is of high complexity. The differential diagnosis for AMS is extensive and includes, but is not limited to: drug overdose - opioids, alcohol, sedatives, antipsychotics, drug withdrawal, others; Metabolic: hypoxia, hypoglycemia, hyperglycemia,  hypercalcemia, hypernatremia, hyponatremia, uremia, hepatic encephalopathy, hypothyroidism, hyperthyroidism, vitamin B12 or thiamine deficiency, carbon monoxide poisoning, Wilson's disease, Lactic acidosis, DKA/HHOS; Infectious: meningitis, encephalitis, bacteremia/sepsis, urinary tract infection, pneumonia, neurosyphilis; Structural: Space-occupying lesion, (brain tumor, subdural hematoma, hydrocephalus,); Vascular: stroke, subarachnoid hemorrhage, coronary ischemia, hypertensive encephalopathy, CNS vasculitis, thrombotic thrombocytopenic purpura, disseminated intravascular coagulation, hyperviscosity; Psychiatric: Schizophrenia, depression; Other: Seizure, hypothermia, heat stroke, ICU psychosis, dementia -"sundowning."  Labs: I ordered reviewed and interpreted labs which include cbc, covid,  Which is negative. Urine is pos for infection. bnp elevated in the setting of hypoxia. Imaging: I ordered and reviewed images which included ct head and  Cxr. There are no acute, significant findings on today's images. [id number] rhythm at a rate of 66 Consults:[id number] with Toxic metabolic encephalopathy. I suspect this is secondary to her UTI. Creatinine is only slightly above her baseline.  New o2 requirement- suspect pulmonary edema. I have ordered diuretics. Rocephin for tx of UTI. DR. Jonelle Sidle has asked for PE study- he will follow  up on Ct results. Patient disposition:admit The patient appears reasonably stabilized for admission considering the current resources, flow, and capabilities available in the ED at this time, and I doubt any other [facility] requiring further screening and/or treatment in the ED prior to admission.        Final Clinical Impression(s) / ED Diagnoses Final diagnoses:  None    Rx / DC Orders ED Discharge Orders    None       Margarita Mail, PA-C 06/04/19 1055    Margarita Mail, PA-C 06/04/19 1057    Isla Pence, MD 06/06/19 1754

## 2019-06-02 NOTE — ED Triage Notes (Signed)
Pt here from home for increased weakness since yesterday and new onset confusion since 1600. Pt lives w/ daughter and became suddenly confused, aox3, disoriented to time. 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 Pt endorses abd pain that began yesterday w/ n/d, EMS gave 4mg  zofran. Per Ems pt had an asymptomatic 10 beat run of v-tach during transport.   135/86 BP 60 HR 95% RA 18 L AC

## 2019-06-02 NOTE — H&P (Signed)
History and Physical   [name] [id number] DOB: Aug 18, 1940 DOA: 06/02/2019  Referring MD/NP/PA: Dr. Gilford Raid  PCP: [name], MD   Outpatient Specialists: None  Patient coming from: Home  Chief Complaint: Confusion  HPI: [name] is a 79 y.o. female with medical history significant of hypertension, hyperlipidemia, coronary artery disease, renal cell carcinoma status post nephrectomy, peripheral vascular disease, status post coronary artery bypass grafting who came from home and complained of confusion.  Patient noted she is forgetful.  She is a retired [employer] scientist.  She was seen and evaluated and found to have significant UTI but also hypoxia.  Did not feel the shortness of breath cough.  Chest x-ray showed mild pulmonary edema.  Patient denied chest pain.  Denied any sick contact.  COVID-19 screen is currently pending.  She is afebrile but symptomatic for her UTI.  With the confusion symptomatic as well as hypoxia she is being admitted for management in the hospital..  ED Course: Temperature 98 blood pressure 164/95 pulse 67 respirate 25 oxygen sats 90% on 2 L.  CBC entirely within normal chemistry showed a creatinine 1.39 and glucose 111 otherwise within normal.  Head CT without contrast negative.  Urinalysis however showed large leukocyte WBC 21-50 RBC 6-10 a rare bacteria.  Urine is also cloudy.  TSH is 9.670 previously.  Review of Systems: As per HPI otherwise 10 point review of systems negative.    Past Medical History:  Diagnosis Date  . Abdominal aortic aneurysm ([place])    infrarenal diagnosed with maximum measurement at 3.3 cm  . Cancer ([place])   . Coronary artery disease   . Depression   . Diverticulosis   . GERD (gastroesophageal reflux disease)   . History of tobacco abuse    Recurrent cigarette smoking  . Hyperlipidemia   . Hyperplastic colon polyp   . Hypertension   . Hypothyroidism   . Ischemic heart disease    had redo bypass surgery in 2001, at that  time she had a free right internal mammary to the LAD, LAD endarterectomy, sequential vein graft to the OM1 and OM2 -- last catheterization was in 2008  . Leg fracture    remote right leg fracture  . Microscopic colitis   . Obesity    with gastric bypass surgery in 2004  . PAD (peripheral artery disease) (HCC)    right iliac occlusion  . PONV (postoperative nausea and vomiting)   . Right kidney mass   . S/P CABG x 4 1985   LIMA to LAD, Sequential SVG to OM1-OM2, SVG to RCA - Dr Redmond Pulling  . S/P redo CABG x 3 2001   Free RIMA to LAD with endarterectomy, sequential SVG to OM1-OM2 - Dr Servando Snare  . 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 ARTERIAL LINE INSERTION Right 05/09/2017   Procedure: ARTERIAL LINE  INSERTION  RIGHT RADIAL ARTERY;  Surgeon: Sherren Mocha, MD;  Location: [facility];  Service: Open Heart Surgery;  Laterality: Right;  . CARDIAC CATHETERIZATION  02/16/1999   Left heart catheterization with selective coronary  angiography, left ventricular angiography (RAO and LAO views), angiography of left internal mammary artery, and angiography of saphenous vein grafts  . CARDIAC CATHETERIZATION  10/20/2006   Est. EF of 55% -- Totally occuladed native coronary circulation -- Persistent patency of the right mammary  graft and the distal LAD artery with peristent patency of the left mammary graft to the proximal LAD artery with persistant patency of the saphenous vein graft to the sequentail branches of the OM        . CARPAL TUNNEL RELEASE    . CHOLECYSTECTOMY    . COLONOSCOPY  07/12/2007   Dr.Brodie- normal cecum and rectum, diverticulosis in the sigmoid colon  . COLONOSCOPY  1991   per Dr.Brodie office visit note= hyperplastic polyp  . COLONOSCOPY N/A 09/30/2014   Dr.Rourk- noraml appearing rectal mucosa, scattered L  sided diverticula, colonic mucosa has a somewhat pale friable appearence diffusely, no ulcers or erosions seen, the distal 10cm of terminal ileum appeared entirely normal. bx=benign colorectal mucosa with lymphocytic colitis  . CORONARY ARTERY BYPASS GRAFT  1985  . CORONARY ARTERY BYPASS GRAFT  03/09/1999   Redo CABG x3 --  with the free right internal mammary artey to the LAD with an LAD endarterectomy -- Sequential saphenous vein graft to OM1 and OM2                   . CYSTOSCOPY/RETROGRADE/URETEROSCOPY Right 12/21/2017   Procedure: [id number];  Surgeon: Raynelle Bring, MD;  Location: [facility];  Service: Urology;  Laterality: Right;  . ESOPHAGOGASTRODUODENOSCOPY N/A 09/30/2014   Dr.Rourk-mild erosive reflux esophagitis s/p bariatric surgery  . EYE SURGERY     catracts  . GASTRIC BYPASS  2004  . INCISION AND DRAINAGE ABSCESS N/A 01/11/2018   Procedure: INCISION AND DRAINAGE ABSCESS;  Surgeon: Raynelle Bring, MD;  Location: [facility];  Service: Urology;  Laterality: N/A;  . IR IMAGING GUIDED PORT INSERTION  05/09/2019  . LAPAROSCOPIC NEPHRECTOMY Right 12/28/2017   Procedure: LAPAROSCOPIC RADICAL NEPHRECTOMY;  Surgeon: Raynelle Bring, MD;  Location: [facility];  Service: Urology;  Laterality: Right;  . RIGHT/LEFT HEART CATH AND CORONARY/GRAFT ANGIOGRAPHY N/A 04/04/2017   Procedure: RIGHT/LEFT HEART CATH AND CORONARY/GRAFT ANGIOGRAPHY;  Surgeon: Martinique, Peter M, MD;  Location: [facility];  Service: Cardiovascular;  Laterality: N/A;  . TEE WITHOUT CARDIOVERSION N/A 05/09/2017   Procedure: TRANSESOPHAGEAL ECHOCARDIOGRAM (TEE);  Surgeon: Sherren Mocha, MD;  Location: [facility];  Service: Open Heart Surgery;  Laterality: N/A;  . TRANSCATHETER AORTIC VALVE REPLACEMENT, TRANSFEMORAL N/A 05/09/2017   Procedure: TRANSCATHETER AORTIC VALVE REPLACEMENT, TRANSFEMORAL using a 74mm Edwards Sapien 3 Aortic Valve;  Surgeon: Sherren Mocha, MD;  Location: [facility];  Service: Open Heart Surgery;  Laterality: N/A;      reports that she has been smoking cigarettes. She started smoking about 34 years ago. She has been smoking about 0.50 packs per day. She has never used smokeless tobacco. She reports current alcohol use. She reports that she does not use drugs.  Allergies  Allergen Reactions  . Chantix [Varenicline Tartrate] Hives  . Dipyridamole Hives and Rash  . Varenicline Hives    Family History  Problem Relation Age of Onset  . Stroke Father   . Diabetes Mother   . Pancreatic cancer Mother      Prior to Admission medications   Medication Sig Start Date End Date Taking? Authorizing Provider  acetaminophen (TYLENOL) 500 MG tablet Take 1,000 mg by mouth every 6 (six) hours as needed for moderate pain or headache.     [provider]  ALPRAZolam Duanne Moron) 0.25 MG tablet Take 0.25 mg by mouth 3 (three) times daily as needed for anxiety. 03/20/17   [provider]  amLODipine (NORVASC) 10 MG tablet Take 1 tablet (10 mg total)  by mouth daily. 03/22/19   Martinique, Peter M, MD  amoxicillin (AMOXIL) 500 MG tablet Take 4 tablets (2,000 mg total) by mouth as directed. 1 hour prior to dental work including cleanings 04/27/18   Eileen Stanford, PA-C  aspirin EC 81 MG tablet Take 81 mg by mouth at bedtime.     [provider]  calcium elemental as carbonate (BARIATRIC TUMS ULTRA) 400 MG chewable tablet Chew 1,000 mg by mouth 3 (three) times daily as needed for heartburn.    [provider]  clobetasol ointment (TEMOVATE) 0.05 % Apply topically. 04/03/19 04/02/20  [provider]  diclofenac Sodium (VOLTAREN) 1 % GEL APPLY 4 GRAMS TO AFFECTED AREA THREE TIMES A DAY AS NEEDED FOR RIGHT KNEE PAIN 07/31/18   [provider]  FLUoxetine (PROZAC) 40 MG capsule Take 40 mg by mouth every evening.    [provider]  furosemide (LASIX) 20 MG tablet Take 20 mg by mouth daily as needed for edema.    [provider]  INLYTA 5 MG tablet TAKE 1 TABLET (5  MG TOTAL) BY MOUTH DAILY. 05/07/19   Wyatt Portela, MD  irbesartan (AVAPRO) 300 MG tablet Take 1 tablet (300 mg total) by mouth daily. 12/24/18   Martinique, Peter M, MD  levothyroxine (SYNTHROID) 50 MCG tablet Take 1 tablet (50 mcg total) by mouth daily before breakfast. 09/19/18   Martinique, Peter M, MD  loperamide (IMODIUM) 2 MG capsule Take 2-4 mg by mouth 4 (four) times daily as needed for diarrhea or loose stools.     [provider]  Nebivolol HCl (BYSTOLIC) 20 MG TABS Take 1 tablet (20 mg total) by mouth daily. 02/22/19   Martinique, Peter M, MD  nitroGLYCERIN (NITROSTAT) 0.4 MG SL tablet Place 0.4 mg under the tongue every 5 (five) minutes as needed for chest pain (MAX 3 TABLETS).    [provider]  nystatin (MYCOSTATIN/NYSTOP) powder APPLY SMALL AMOUNT TO AFFECTED AREA THREE TIMES A DAY 12/12/18   [provider]  pantoprazole (PROTONIX) 40 MG tablet TAKE 1 TABLET BY MOUTH EVERY DAY 02/20/19   Annitta Needs, NP  PROAIR HFA 108 [phone] Base) MCG/ACT inhaler Inhale 2 puffs into the lungs every 4 (four) hours as needed for shortness of breath or wheezing. 03/14/17   [provider]  prochlorperazine (COMPAZINE) 10 MG tablet Take 1 tablet (10 mg total) by mouth every 6 (six) hours as needed for nausea or vomiting. 04/11/19   Wyatt Portela, MD  simethicone (MYLICON) 0000000 MG chewable tablet Chew 125 mg by mouth every 6 (six) hours as needed for flatulence.    [provider]  simvastatin (ZOCOR) 40 MG tablet Take 1 tablet (40 mg total) by mouth at bedtime. 03/18/19   Martinique, Peter M, MD  SYMBICORT 160-4.5 MCG/ACT inhaler Inhale 1 puff into the lungs 2 (two) times daily as needed (for respiratory issues.).  03/14/17   [provider]    Physical Exam: Vitals:   06/02/19 2100 06/02/19 2115 06/02/19 2145 06/02/19 2215  BP: (!) 146/76 130/78 (!) 164/95 (!) 155/84  Pulse: 66 63 67 (!) 31  Resp: (!) 25 (!) 24 (!) 25 19  Temp:      TempSrc:      SpO2: 96% 95% 98% 98%       Constitutional: Stable with no acute distress Vitals:   06/02/19 2100 06/02/19 2115 06/02/19 2145 06/02/19 2215  BP: (!) 146/76 130/78 (!) 164/95 (!) 155/84  Pulse: 66  63 67 (!) 31  Resp: (!) 25 (!) 24 (!) 25 19  Temp:      TempSrc:      SpO2: 96% 95% 98% 98%   Eyes: PERRL, lids and conjunctivae normal ENMT: Mucous membranes are moist. Posterior pharynx clear of any exudate or lesions.Normal dentition.  Neck: normal, supple, no masses, no thyromegaly Respiratory: clear to auscultation bilaterally, no wheezing, bilateral lower lobe crackles normal respiratory effort. No accessory muscle use.  Cardiovascular: Irregular rate and rhythm, no murmurs / rubs / gallops. No extremity edema. 2+ pedal pulses. No carotid bruits.  Abdomen: no tenderness, no masses palpated. No hepatosplenomegaly. Bowel sounds positive.  Musculoskeletal: no clubbing / cyanosis. No joint deformity upper and lower extremities. Good ROM, no contractures. Normal muscle tone.  Skin: no rashes, lesions, ulcers. No induration Neurologic: CN 2-12 grossly intact. Sensation intact, DTR normal. Strength 5/5 in all 4.  Psychiatric: Confused, communicating, disoriented in date and time   Labs on Admission: I have personally reviewed following labs and imaging studies  CBC: Recent Labs  Lab 06/02/19 1929 06/02/19 1944  WBC 7.8  --   NEUTROABS 5.4  --   HGB 12.4 12.9  HCT 40.3 38.0  MCV 86.3  --   PLT 173  --    Basic Metabolic Panel: Recent Labs  Lab 06/02/19 1929 06/02/19 1944  NA 135 135  K 4.4 4.2  CL 103  --   CO2 22  --   GLUCOSE 111*  --   BUN 21  --   CREATININE 1.39*  --   CALCIUM 9.6  --    GFR: CrCl cannot be calculated (Unknown ideal weight.). Liver Function Tests: Recent Labs  Lab 06/02/19 1929  AST 19  ALT 14  ALKPHOS 71  BILITOT 1.4*  PROT 6.5  ALBUMIN 3.1*   No results for input(s): LIPASE, AMYLASE in the last 168 hours. Recent Labs  Lab 06/02/19 1929  AMMONIA 22    Coagulation Profile: No results for input(s): INR, PROTIME in the last 168 hours. Cardiac Enzymes: No results for input(s): CKTOTAL, CKMB, CKMBINDEX, TROPONINI in the last 168 hours. BNP (last 3 results) No results for input(s): PROBNP in the last 8760 hours. HbA1C: No results for input(s): HGBA1C in the last 72 hours. CBG: Recent Labs  Lab 06/02/19 1926  GLUCAP 90   Lipid Profile: No results for input(s): CHOL, HDL, LDLCALC, TRIG, CHOLHDL, LDLDIRECT in the last 72 hours. Thyroid Function Tests: No results for input(s): TSH, T4TOTAL, FREET4, T3FREE, THYROIDAB in the last 72 hours. Anemia Panel: No results for input(s): VITAMINB12, FOLATE, FERRITIN, TIBC, IRON, RETICCTPCT in the last 72 hours. Urine analysis:    Component Value Date/Time   COLORURINE YELLOW 06/02/2019 2206   APPEARANCEUR CLOUDY (A) 06/02/2019 2206   LABSPEC 1.011 06/02/2019 2206   PHURINE 5.0 06/02/2019 2206   GLUCOSEU NEGATIVE 06/02/2019 2206   HGBUR MODERATE (A) 06/02/2019 2206   BILIRUBINUR NEGATIVE 06/02/2019 2206   [name] 06/02/2019 2206   PROTEINUR NEGATIVE 06/02/2019 2206   UROBILINOGEN 0.2 10/24/2007 1052   NITRITE NEGATIVE 06/02/2019 2206   LEUKOCYTESUR LARGE (A) 06/02/2019 2206   Sepsis Labs: @LABRCNTIP (procalcitonin:4,lacticidven:4) ) Recent Results (from the past 240 hour(s))  SARS Coronavirus 2 by RT PCR (hospital order, performed in [facility] hospital lab) Nasopharyngeal Nasopharyngeal Swab     Status: None   Collection Time: 06/02/19  7:29 PM   Specimen: Nasopharyngeal Swab  Result Value Ref Range Status   SARS Coronavirus 2 NEGATIVE NEGATIVE  Final    Comment: (NOTE) SARS-CoV-2 target nucleic acids are NOT DETECTED. The SARS-CoV-2 RNA is generally detectable in upper and lower respiratory specimens during the acute phase of infection. The lowest concentration of SARS-CoV-2 viral copies this assay can detect is 250 copies / mL. A negative result does not preclude  SARS-CoV-2 infection and should not be used as the sole basis for treatment or other patient management decisions.  A negative result may occur with improper specimen collection / handling, submission of specimen other than nasopharyngeal swab, presence of viral mutation(s) within the areas targeted by this assay, and inadequate number of viral copies (<250 copies / mL). A negative result must be combined with clinical observations, patient history, and epidemiological information. Fact Sheet for Patients:   StrictlyIdeas.no Fact Sheet for Healthcare Providers: BankingDealers.co.za This test is not yet approved or cleared  by the [place] FDA and has been authorized for detection and/or diagnosis of SARS-CoV-2 by FDA under an Emergency Use Authorization (EUA).  This EUA will remain in effect (meaning this test can be used) for the duration of the COVID-19 declaration under Section 564(b)(1) of the Act, 21 U.S.C. section 360bbb-3(b)(1), unless the authorization is terminated or revoked sooner. Performed at [facility], [place] [address], [place], [place] [address]      Radiological Exams on Admission: CT Head Wo Contrast  Result Date: 06/02/2019 CLINICAL DATA:  Increased weakness.  New onset confusion. EXAM: CT HEAD WITHOUT CONTRAST TECHNIQUE: Contiguous axial images were obtained from the base of the skull through the vertex without intravenous contrast. COMPARISON:  None. FINDINGS: Brain: No subdural, epidural, or subarachnoid hemorrhage identified. Brainstem, basal cisterns, and cerebellum are normal. Ventricles and sulci are unremarkable. No acute cortical ischemia or infarct. No mass effect or midline shift. Vascular: Calcified atherosclerosis is seen in the intracranial carotids. Skull: Normal. Negative for fracture or focal lesion. Sinuses/Orbits: No acute finding. Other: None. IMPRESSION: No acute intracranial abnormalities to  explain the patient's acute mental status change. Electronically Signed   By: Dorise Bullion III M.D   On: 06/02/2019 20:59   DG Chest Port 1 View  Result Date: 06/02/2019 CLINICAL DATA:  Shortness of breath. Hypoxia. EXAM: PORTABLE CHEST 1 VIEW COMPARISON:  January 10, 2018 FINDINGS: The heart size is enlarged. The patient is status post prior TAVR. There is a well-positioned right-sided Port-A-Cath in place. The patient is status post prior median sternotomy. There is vascular congestion with mild pulmonary edema. There is stable elevation of the right hemidiaphragm. There is no large focal infiltrate or pleural effusion. IMPRESSION: 1. Cardiomegaly with mild pulmonary edema. 2. Stable elevation of the right hemidiaphragm. 3. No large focal infiltrate or pleural effusion. Electronically Signed   By: Constance Holster M.D.   On: 06/02/2019 19:19    EKG: Independently reviewed.  It shows sinus rhythm with a rate of 66 left anterior fascicular block and possible anteroseptal infarct  Assessment/Plan Principal Problem:   UTI (urinary tract infection) Active Problems:   Tobacco abuse   GERD (gastroesophageal reflux disease)   S/P CABG x 4   Hypothyroidism   Anxiety   COPD (chronic obstructive pulmonary disease) (HCC)   AKI (acute kidney injury) (HCC)   Chronic diastolic CHF (congestive heart failure) (HCC)   Renal cell carcinoma ([place])   Hypoxia     #1 UTI: Patient is symptomatic with some dysuria but also mild altered mental status.  We will intubate patient.  Urine cultures obtained blood cultures obtained.  She  is afebrile and no evidence of sepsis.  IV Rocephin will be initiated empirically.  #2 acute hypoxia: Sats in the upper 80s.  Probably due to CHF.  Patient also has COPD but no acute exacerbation she has no coronary artery disease.  At her age PE is likely.  Will order CT angiogram of the chest and monitor.  #3 GERD: Continue home PPIs  #4 hypothyroidism: Recent TSH was  elevated.  Confirm and resume levothyroxine  #5 history of renal cell carcinoma: Continue per her oncologist  #6 anxiety disorder: Continue close monitoring.   DVT prophylaxis: Lovenox Code Status: Full code Family Communication: No family at bedside Disposition Plan: Home Consults called: None Admission status: Inpatient  Severity of Illness: The appropriate patient status for this patient is INPATIENT. Inpatient status is judged to be reasonable and necessary in order to provide the required intensity of service to ensure the patient's safety. The patient's presenting symptoms, physical exam findings, and initial radiographic and laboratory data in the context of their chronic comorbidities is felt to place them at high risk for further clinical deterioration. Furthermore, it is not anticipated that the patient will be medically stable for discharge from the hospital within 2 midnights of admission. The following factors support the patient status of inpatient.   " The patient's presenting symptoms include confusion with dysuria. " The worrisome physical exam findings include mild confusion. " The initial radiographic and laboratory data are worrisome because of evidence of UTI and. " The chronic co-morbidities include COPD with coronary artery disease.   * I certify that at the point of admission it is my clinical judgment that the patient will require inpatient hospital care spanning beyond 2 midnights from the point of admission due to high intensity of service, high risk for further deterioration and high frequency of surveillance required.Barbette Merino MD [facility] Pager [phone]  If 7PM-7AM, please contact night-coverage [email or web address] Password [facility]  06/02/2019, 11:52 PM

## 2019-06-03 ENCOUNTER — Inpatient Hospital Stay (HOSPITAL_COMMUNITY): Payer: Medicare Other

## 2019-06-03 ENCOUNTER — Encounter (HOSPITAL_COMMUNITY): Payer: Self-pay | Admitting: Internal Medicine

## 2019-06-03 ENCOUNTER — Telehealth: Payer: Self-pay | Admitting: Pharmacist

## 2019-06-03 ENCOUNTER — Other Ambulatory Visit: Payer: Self-pay | Admitting: Oncology

## 2019-06-03 DIAGNOSIS — G9340 Encephalopathy, unspecified: Secondary | ICD-10-CM | POA: Diagnosis present

## 2019-06-03 DIAGNOSIS — J9601 Acute respiratory failure with hypoxia: Secondary | ICD-10-CM

## 2019-06-03 LAB — COMPREHENSIVE METABOLIC PANEL
ALT: 14 U/L (ref 0–44)
AST: 17 U/L (ref 15–41)
Albumin: 3.1 g/dL — ABNORMAL LOW (ref 3.5–5.0)
Alkaline Phosphatase: 70 U/L (ref 38–126)
Anion gap: 8 (ref 5–15)
BUN: 17 mg/dL (ref 8–23)
CO2: 28 mmol/L (ref 22–32)
Calcium: 9.7 mg/dL (ref 8.9–10.3)
Chloride: 101 mmol/L (ref 98–111)
Creatinine, Ser: 1.51 mg/dL — ABNORMAL HIGH (ref 0.44–1.00)
GFR calc Af Amer: 39 mL/min — ABNORMAL LOW (ref >60)
GFR calc non Af Amer: 33 mL/min — ABNORMAL LOW (ref >60)
Glucose, Bld: 111 mg/dL — ABNORMAL HIGH (ref 70–99)
Potassium: 4.3 mmol/L (ref 3.5–5.1)
Sodium: 137 mmol/L (ref 135–145)
Total Bilirubin: 0.9 mg/dL (ref 0.3–1.2)
Total Protein: 6.7 g/dL (ref 6.5–8.1)

## 2019-06-03 LAB — CBC
HCT: 41.8 % (ref 36.0–46.0)
Hemoglobin: 12.9 g/dL (ref 12.0–15.0)
MCH: 26.7 pg (ref 26.0–34.0)
MCHC: 30.9 g/dL (ref 30.0–36.0)
MCV: 86.4 fL (ref 80.0–100.0)
Platelets: 164 10*3/uL (ref 150–400)
RBC: 4.84 MIL/uL (ref 3.87–5.11)
RDW: 14.3 % (ref 11.5–15.5)
WBC: 9 10*3/uL (ref 4.0–10.5)
nRBC: 0 % (ref 0.0–0.2)

## 2019-06-03 LAB — MRSA PCR SCREENING: MRSA by PCR: NEGATIVE

## 2019-06-03 MED ORDER — AMLODIPINE BESYLATE 10 MG PO TABS
10.0000 mg | ORAL_TABLET | Freq: Every day | ORAL | Status: DC
  Administered 2019-06-03: 10 mg via ORAL
  Filled 2019-06-03: qty 1

## 2019-06-03 MED ORDER — LEVOTHYROXINE SODIUM 75 MCG PO TABS
75.0000 ug | ORAL_TABLET | Freq: Every day | ORAL | Status: DC
  Administered 2019-06-04 – 2019-06-05 (×2): 75 ug via ORAL
  Filled 2019-06-03 (×2): qty 1

## 2019-06-03 MED ORDER — SODIUM CHLORIDE 0.9 % IV SOLN
1.0000 g | INTRAVENOUS | Status: DC
  Administered 2019-06-03 – 2019-06-04 (×2): 1 g via INTRAVENOUS
  Filled 2019-06-03 (×2): qty 10
  Filled 2019-06-03 (×2): qty 1

## 2019-06-03 MED ORDER — CHLORHEXIDINE GLUCONATE CLOTH 2 % EX PADS
6.0000 | MEDICATED_PAD | Freq: Every day | CUTANEOUS | Status: DC
  Administered 2019-06-04 – 2019-06-05 (×2): 6 via TOPICAL

## 2019-06-03 MED ORDER — ONDANSETRON HCL 4 MG/2ML IJ SOLN
4.0000 mg | Freq: Four times a day (QID) | INTRAMUSCULAR | Status: DC | PRN
  Administered 2019-06-03: 4 mg via INTRAVENOUS
  Filled 2019-06-03: qty 2

## 2019-06-03 MED ORDER — SIMVASTATIN 20 MG PO TABS
40.0000 mg | ORAL_TABLET | Freq: Every day | ORAL | Status: DC
  Administered 2019-06-03 – 2019-06-04 (×2): 40 mg via ORAL
  Filled 2019-06-03 (×3): qty 2

## 2019-06-03 MED ORDER — AXITINIB 5 MG PO TABS
5.0000 mg | ORAL_TABLET | Freq: Two times a day (BID) | ORAL | Status: DC
  Administered 2019-06-03: 5 mg via ORAL
  Filled 2019-06-03 (×2): qty 1

## 2019-06-03 MED ORDER — IOHEXOL 350 MG/ML SOLN
75.0000 mL | Freq: Once | INTRAVENOUS | Status: AC | PRN
  Administered 2019-06-03: 75 mL via INTRAVENOUS

## 2019-06-03 MED ORDER — ACETAMINOPHEN 650 MG RE SUPP: 650.0000 mg | Freq: Four times a day (QID) | RECTAL | Status: DC | PRN

## 2019-06-03 MED ORDER — ASPIRIN EC 81 MG PO TBEC
81.0000 mg | DELAYED_RELEASE_TABLET | Freq: Every day | ORAL | Status: DC
  Administered 2019-06-03 – 2019-06-04 (×2): 81 mg via ORAL
  Filled 2019-06-03 (×2): qty 1

## 2019-06-03 MED ORDER — ALBUTEROL SULFATE (2.5 MG/3ML) 0.083% IN NEBU: 3.0000 mL | INHALATION_SOLUTION | RESPIRATORY_TRACT | Status: DC | PRN

## 2019-06-03 MED ORDER — PANTOPRAZOLE SODIUM 40 MG PO TBEC
40.0000 mg | DELAYED_RELEASE_TABLET | Freq: Every day | ORAL | Status: DC
  Administered 2019-06-03 – 2019-06-05 (×3): 40 mg via ORAL
  Filled 2019-06-03 (×3): qty 1

## 2019-06-03 MED ORDER — ALPRAZOLAM 0.25 MG PO TABS
0.2500 mg | ORAL_TABLET | Freq: Three times a day (TID) | ORAL | Status: DC | PRN
  Administered 2019-06-03 – 2019-06-04 (×2): 0.25 mg via ORAL
  Filled 2019-06-03 (×2): qty 1

## 2019-06-03 MED ORDER — MOMETASONE FURO-FORMOTEROL FUM 200-5 MCG/ACT IN AERO
2.0000 | INHALATION_SPRAY | Freq: Two times a day (BID) | RESPIRATORY_TRACT | Status: DC
  Administered 2019-06-04 – 2019-06-05 (×3): 2 via RESPIRATORY_TRACT
  Filled 2019-06-03: qty 8.8

## 2019-06-03 MED ORDER — ONDANSETRON HCL 4 MG PO TABS: 4.0000 mg | ORAL_TABLET | Freq: Four times a day (QID) | ORAL | Status: DC | PRN

## 2019-06-03 MED ORDER — SODIUM CHLORIDE 0.9 % IV SOLN
1.0000 g | INTRAVENOUS | Status: DC
  Filled 2019-06-03: qty 10

## 2019-06-03 MED ORDER — NEBIVOLOL HCL 10 MG PO TABS
20.0000 mg | ORAL_TABLET | Freq: Every day | ORAL | Status: DC
  Administered 2019-06-03 – 2019-06-05 (×3): 20 mg via ORAL
  Filled 2019-06-03 (×3): qty 2

## 2019-06-03 MED ORDER — ENOXAPARIN SODIUM 40 MG/0.4ML ~~LOC~~ SOLN
40.0000 mg | SUBCUTANEOUS | Status: DC
  Administered 2019-06-03 – 2019-06-05 (×3): 40 mg via SUBCUTANEOUS
  Filled 2019-06-03 (×3): qty 0.4

## 2019-06-03 MED ORDER — LEVOTHYROXINE SODIUM 50 MCG PO TABS
50.0000 ug | ORAL_TABLET | Freq: Every day | ORAL | Status: DC
  Administered 2019-06-03: 50 ug via ORAL
  Filled 2019-06-03: qty 1

## 2019-06-03 MED ORDER — ACETAMINOPHEN 325 MG PO TABS
650.0000 mg | ORAL_TABLET | Freq: Four times a day (QID) | ORAL | Status: DC | PRN
  Administered 2019-06-03: 650 mg via ORAL
  Filled 2019-06-03: qty 2

## 2019-06-03 MED ORDER — IRBESARTAN 300 MG PO TABS
300.0000 mg | ORAL_TABLET | Freq: Every day | ORAL | Status: DC
  Administered 2019-06-03: 300 mg via ORAL
  Filled 2019-06-03: qty 1

## 2019-06-03 NOTE — Progress Notes (Signed)
New Admission Note: ? Arrival Method: Stretcher Mental Orientation: Alert and Oriented x4 Telemetry: Box 5 Assessment: Completed Skin: Refer to flowsheet IV: Left Antecubital Pain: 0/10 Tubes: [place] Safety Measures: Safety Fall Prevention Plan discussed with patient. Admission: Completed 5 [place] Orientation: Patient has been orientated to the room, unit and the staff. Family: None at the Bedside at this time Orders have been reviewed and are being implemented. Will continue to monitor the patient. Call light has been placed within reach and bed alarm has been activated.  ? Milagros Loll, RN  Phone Number: [phone]

## 2019-06-03 NOTE — Evaluation (Signed)
Physical Therapy Evaluation Patient Details Name: [name] MRN: [id number] DOB: October 18, 1940 Today's Date: 06/03/2019   History of Present Illness  Patient is a 79 year old female admitted from home with confusion, weakness. Admitted with UTI and hypoxia. PMH includes: kidney CA, CABG, PAD, CAD, COPD,  Clinical Impression  Patient received up in recliner. Wants to get back into bed and take a nap. Agrees to PT assessment. She performed sit to stand from recliner with min guard. Ambulated 25 feet in room without ad, and min guard but reaching for bed at times for steadying. She ambulated on room air and saturations remained at 94% or greater. Supplemental O2 left off and RN notified. She reports fatigue and states she did not sleep last night. She will continue to benefit from skilled PT while here to improve strength, safety with mobility and functional independence.       Follow Up Recommendations Home health PT;Supervision for mobility/OOB;Supervision - Intermittent    Equipment Recommendations  None recommended by PT    Recommendations for Other Services       Precautions / Restrictions Precautions Precautions: Fall Restrictions Weight Bearing Restrictions: No      Mobility  Bed Mobility Overal bed mobility: Needs Assistance Bed Mobility: Sit to Supine       Sit to supine: Min assist   General bed mobility comments: patient received in recliner, returned to supine in bed.  Transfers   Equipment used: None                Ambulation/Gait Ambulation/Gait assistance: Min Conservator, museum/gallery (Feet): 25 Feet Assistive device: 1 person hand held assist Gait Pattern/deviations: WFL(Within Functional Limits);Decreased stride length Gait velocity: decr   General Gait Details: generally steady but reports fatigue and weakness  Stairs            Wheelchair Mobility    Modified Rankin (Stroke Patients Only)       Balance Overall balance assessment:  Needs assistance Sitting-balance support: Feet supported Sitting balance-Leahy Scale: Good     Standing balance support: Single extremity supported;During functional activity Standing balance-Leahy Scale: Fair Standing balance comment: would benefit from single or B UE support for safety and balance                             Pertinent Vitals/Pain Pain Assessment: No/denies pain    Home Living Family/patient expects to be discharged to:: Private residence Living Arrangements: Children Available Help at Discharge: Family;Available PRN/intermittently Type of Home: House Home Access: Ramped entrance     Home Layout: One level Home Equipment: Environmental consultant - 2 wheels Additional Comments: Daughter works and patient is alone during that time, she is a English as a second language teacher and they are looking into gettin her some assistance when daughter is at work    Prior Function Level of Independence: Independent with assistive device(s)         Comments: uses walker at times     Hand Dominance        Extremity/Trunk Assessment   Upper Extremity Assessment Upper Extremity Assessment: Generalized weakness    Lower Extremity Assessment Lower Extremity Assessment: Generalized weakness    Cervical / Trunk Assessment Cervical / Trunk Assessment: Normal  Communication   Communication: HOH  Cognition Arousal/Alertness: Awake/alert Behavior During Therapy: WFL for tasks assessed/performed Overall Cognitive Status: Within Functional Limits for tasks assessed  General Comments      Exercises     Assessment/Plan    PT Assessment Patient needs continued PT services  PT Problem List Decreased strength;Decreased mobility;Decreased activity tolerance;Decreased balance;Cardiopulmonary status limiting activity;Decreased safety awareness       PT Treatment Interventions DME instruction;Therapeutic exercise;Gait training;Balance  training;Neuromuscular re-education;Functional mobility training;Therapeutic activities;Patient/family education    PT Goals (Current goals can be found in the Care Plan section)  Acute Rehab PT Goals Patient Stated Goal: to return home, get strength back PT Goal Formulation: With patient/family Time For Goal Achievement: 06/17/19 Potential to Achieve Goals: Good    Frequency Min 3X/week   Barriers to discharge Decreased caregiver support      Co-evaluation               AM-PAC PT "6 Clicks" Mobility  Outcome Measure Help needed turning from your back to your side while in a flat bed without using bedrails?: None Help needed moving from lying on your back to sitting on the side of a flat bed without using bedrails?: A Little Help needed moving to and from a bed to a chair (including a wheelchair)?: A Little Help needed standing up from a chair using your arms (e.g., wheelchair or bedside chair)?: A Little Help needed to walk in hospital room?: A Little Help needed climbing 3-5 steps with a railing? : A Lot 6 Click Score: 18    End of Session Equipment Utilized During Treatment: Gait belt Activity Tolerance: Patient tolerated treatment well Patient left: in bed;with bed alarm set;with family/visitor present Nurse Communication: Mobility status PT Visit Diagnosis: Muscle weakness (generalized) (M62.81);Difficulty in walking, not elsewhere classified (R26.2);History of falling (Z91.81)    Time: [id number] PT Time Calculation (min) (ACUTE ONLY): 25 min   Charges:   PT Evaluation $PT Eval Moderate Complexity: 1 Mod PT Treatments $Gait Training: 8-22 mins        [employer], PT, [facility] 06/03/19,12:23 PM

## 2019-06-03 NOTE — TOC Initial Note (Addendum)
Transition of Care [facility]) - Initial/Assessment Note    Patient Details  Name: [name] MRN: [id number] Date of Birth: 05-17-1940  [facility] CM/SW Contact:    Marilu Favre, RN Phone Number: 06/03/2019, 1:19 PM  Clinical Narrative:                 Spoke to patient and daughter Carmell Austria at bedside. Confirmed face sheet information. Patient lives with daughter Curt Bears.   Patient already has wheel chair , walker and shower chair.   Provided Medicare.gov list of home health agencies.   Patient has used [facility] in the past and would like them again. Spoke with Butch Penny with [facility] she is unable to accept due to insurance.   Patient and Carmell Austria aware. Second choice is [facility]. Called Drew with [facility] awaiting call back.  Dian Situ with [facility] accepted referral. Expected Discharge Plan: [place] Barriers to Discharge: Continued Medical Work up   Patient Goals and CMS Choice Patient states their goals for this hospitalization and ongoing recovery are:: to return to home CMS Medicare.gov Compare Post Acute Care list provided to:: Patient Choice offered to / list presented to : Patient, Adult Children  Expected Discharge Plan and Services Expected Discharge Plan: [place]   Discharge Planning Services: CM Consult Post Acute Care Choice: [place] arrangements for the past 2 months: Single Family Home                 DME Arranged: N/A DME Agency: NA       HH Arranged: PT          Prior Living Arrangements/Services Living arrangements for the past 2 months: Single Family Home Lives with:: Adult Children Patient language and need for interpreter reviewed:: Yes Do you feel safe going back to the place where you live?: Yes      Need for Family Participation in Patient Care: Yes (Comment) Care giver support system in place?: Yes (comment) Current home services: DME Criminal Activity/Legal  Involvement Pertinent to Current Situation/Hospitalization: No - Comment as needed  Activities of Daily Living Home Assistive Devices/Equipment: Cane (specify quad or straight), Shower chair with back ADL Screening (condition at time of admission) Patient's cognitive ability adequate to safely complete daily activities?: Yes Is the patient deaf or have difficulty hearing?: No Does the patient have difficulty seeing, even when wearing glasses/contacts?: No Does the patient have difficulty concentrating, remembering, or making decisions?: No Patient able to express need for assistance with ADLs?: Yes Does the patient have difficulty dressing or bathing?: No Independently performs ADLs?: No Bathing: Needs assistance Does the patient have difficulty walking or climbing stairs?: Yes Weakness of Legs: Both Weakness of Arms/Hands: Both  Permission Sought/Granted   Permission granted to share information with : Yes, Verbal Permission Granted  Share Information with NAME: daughters Dashley Rogillio, [place] granted to share info w AGENCY: [facility], [facility]        Emotional Assessment Appearance:: Appears stated age Attitude/Demeanor/Rapport: Engaged Affect (typically observed): Accepting Orientation: : Oriented to Self, Oriented to Place, Oriented to  Time, Oriented to Situation Alcohol / Substance Use: Not Applicable Psych Involvement: No (comment)  Admission diagnosis:  Metabolic encephalopathy 99991111 UTI (urinary tract infection) [N39.0] Acute cystitis without hematuria [N30.00] Acute respiratory failure with hypoxia ([place]) [J96.01] Patient Active Problem List   Diagnosis Date Noted  . Acute respiratory failure with hypoxia ([place]) 06/03/2019  . Acute encephalopathy  06/03/2019  . UTI (urinary tract infection) 06/02/2019  . Hypoxia 06/02/2019  . Goals of care, counseling/discussion 04/11/2019  . Severe sepsis ([place])   . Macrocytic anemia   .  Abdominal wall abscess at site of surgical wound 01/11/2018  . Hypothyroidism 01/11/2018  . Anxiety 01/11/2018  . COPD (chronic obstructive pulmonary disease) ([place]) 01/11/2018  . AKI (acute kidney injury) ([place]) 01/11/2018  . Chronic diastolic CHF (congestive heart failure) ([place]) 01/11/2018  . Renal cell carcinoma ([place]) 01/11/2018  . Neoplasm of right kidney 12/28/2017  . Severe aortic stenosis 05/09/2017  . 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 GERD (gastroesophageal reflux disease) 10/19/2016  . 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 Carotid arterial disease ([place]) 05/28/2015  . Bilateral carotid bruits 01/16/2015  . Reflux esophagitis   . Hiatal hernia   . Status post bariatric surgery   . Chronic diarrhea   . Diverticulosis of colon without hemorrhage   . PAD (peripheral artery disease) ([place]) 04/13/2011  . Aortic stenosis, severe 09/10/2010  . CAD (coronary artery disease) 08/11/2010  . HTN (hypertension) 08/11/2010  . Obesities, morbid ([place]) 08/11/2010  . Tobacco abuse 08/11/2010  . Abdominal aortic aneurysm ([place]) 12/28/2007  . DIVERTICULOSIS, COLON 12/28/2007  . FATTY LIVER DISEASE 12/28/2007  . ANEMIA, HX OF 12/28/2007  . NAUSEA 12/12/2007  . ABDOMINAL PAIN, EPIGASTRIC 12/12/2007  . S/P redo CABG x 3 03/09/1999  . 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

## 2019-06-03 NOTE — Progress Notes (Signed)
Progress Note    KYANA GHIO  [id number] DOB: 04-May-1940  DOA: 06/02/2019 PCP: [name], MD    Brief Narrative:    Medical records reviewed and are as summarized below:  CERRA MAROTTA is an 79 y.o. female with a past medical history that includes hypertension, CAD status post CABG, chronic congestive heart failure, aortic stenosis status post TAVR, renal cell carcinoma status post nephrectomy, peripheral vascular disease, recently diagnosed metastatic disease admitted May 23 with acute encephalopathy, acute respiratory failure with hypoxia and urinary tract infection.  She is provided with IV antibiotics and oxygen supplementation.  Assessment/Plan:   Principal Problem:   Acute respiratory failure with hypoxia (HCC) Active Problems:   AKI (acute kidney injury) ([place])   UTI (urinary tract infection)   Acute encephalopathy   CAD (coronary artery disease)   Aortic stenosis, severe   COPD (chronic obstructive pulmonary disease) (HCC)   Chronic diastolic CHF (congestive heart failure) (HCC)   Renal cell carcinoma (HCC)   GERD (gastroesophageal reflux disease)   Hypothyroidism   Anxiety   Tobacco abuse   S/P CABG x 4   #1.  Acute respiratory failure with hypoxia.  Her oxygen saturation level was in the 80s.  Likely related to chronic heart failure in the setting of COPD.  CT angiogram negative for PE but reveals mild volume overload.  She was provided with Lasix 40 mg IV in the emergency department.  Wear oxygen at home.  She does indicate she is experience some shortness of breath with exertion over the last for 5 weeks.  Oxygen saturation level 96% on 2 L. -Ambulate monitor oxygen saturation level - continue Oxygen supplemenataion -wean oxygen as able -Continue inhalers -Continue nebulizers  #2.  Acute encephalopathy.  Likely related to above in the setting of UTI.  CT of the head no acute intracranial abnormalities.  No metabolic derangements.  She appears to be  at baseline this morning. -Continue IV antibiotics for urinary tract infection -Monitor  #3.  Urinary tract infection.  She is afebrile hemodynamically stable and nontoxic-appearing -Follow urine cultures -Continue Rocephin  #4.  History of renal cell carcinoma.  Chart review indicates patient was diagnosed with renal cell carcinoma in December 2019.  She underwent right radical nephrectomy.  She was found to have presumed metastatic disease in March 2021.  Underwent Port-A-Cath placement 3 weeks ago.  Home medications include Inlyta -unable to order [facility] -continue Inlyta from patient supply  #5.  Chronic diastolic heart failure.  She does not appear overloaded but CT angio of the chest showed mild volume overload.  She was provided with IV Lasix 40 mg in the emergency department.  Home medications include as needed Lasix and beta-blocker.  Echo done July of last year reveals an ejection fraction of 60 to 65% -Daily weights -Intake and output -Continue home meds  #6.  COPD.  Not on home oxygen.  Not appear to be having an exacerbation. -Continue nebulizers and inhalers -See #1  #7.[name]  Acute kidney injury.  Creatinine 1.51 today.  That is up from 1.26 2 weeks ago. -Hold nephrotoxins as able -Monitor urine output -Recheck in the morning  #8.  Aortic stenosis/CAD/status post CABG x4.  Status post TAVR.  EKG with left fascicular block.  No chest pain. -continue home meds.   #9.  GERD.  Stable at baseline. -Continue PPI  #10.  Hypothyroidism.  TSH greater than 9. -Continue home Synthroid -Outpatient follow-up   Family Communication/Anticipated D/C  date and plan/Code Status   DVT prophylaxis: Lovenox ordered. Code Status: Full Code.  Family Communication: patient Disposition Plan: Status is: Inpatient  Remains inpatient appropriate because:IV treatments appropriate due to intensity of illness or inability to take PO   Dispo: The patient is from: Home               Anticipated d/c is to: Home              Anticipated d/c date is: 1 day              Patient currently is not medically stable to d/c.    Medical Consultants:    None.   Anti-Infectives:    Rocephin 5/23>>  Subjective:   Lying in bed watching TV.  Denies pain or discomfort.  Objective:    Vitals:   06/03/19 0208 06/03/19 0236 06/03/19 0301 06/03/19 0857  BP:  124/76 (!) 161/71 113/64  Pulse:  66 71 63  Resp:  19 16 16   Temp:  97.8 F (36.6 C) 97.7 F (36.5 C) 97.9 F (36.6 C)  TempSrc:  Oral Oral Oral  SpO2:  96% 100% 96%  Weight: 93.4 kg     Height: 5' 7.5" (1.715 m)       Intake/Output Summary (Last 24 hours) at 06/03/2019 1121 Last data filed at 06/03/2019 0645 Gross per 24 hour  Intake 340 ml  Output 800 ml  Net -460 ml   Filed Weights   06/03/19 0208  Weight: 93.4 kg    Exam: General: Well-nourished awake alert no acute distress CV: Regular rate and rhythm no murmur gallop or rub no lower extremity edema Respiratory: Mild increased work of breathing with conversation.  Breath sounds are diminished.  Fine crackles bilateral bases no wheeze Abdomen: Obese soft positive bowel sounds throughout nontender to palpation no guarding or rebound Musculoskeletal joints without swelling/erythema full range of motion Neuro: Awake alert oriented x3 speech clear facial symmetry bilateral grip 5 out of 5 lower extremity strength 5 out of 5 bilaterally  Data Reviewed:   I have personally reviewed following labs and imaging studies:  Labs: Labs show the following:   Basic Metabolic Panel: Recent Labs  Lab 06/02/19 1929 06/02/19 1929 06/02/19 1944 06/03/19 0453  NA 135  --  135 137  K 4.4   < > 4.2 4.3  CL 103  --   --  101  CO2 22  --   --  28  GLUCOSE 111*  --   --  111*  BUN 21  --   --  17  CREATININE 1.39*  --   --  1.51*  CALCIUM 9.6  --   --  9.7   < > = values in this interval not displayed.   GFR Estimated Creatinine Clearance: 37.5 mL/min  (A) (by C-G formula based on SCr of 1.51 mg/dL (H)). Liver Function Tests: Recent Labs  Lab 06/02/19 1929 06/03/19 0453  AST 19 17  ALT 14 14  ALKPHOS 71 70  BILITOT 1.4* 0.9  PROT 6.5 6.7  ALBUMIN 3.1* 3.1*   No results for input(s): LIPASE, AMYLASE in the last 168 hours. Recent Labs  Lab 06/02/19 1929  AMMONIA 22   Coagulation profile No results for input(s): INR, PROTIME in the last 168 hours.  CBC: Recent Labs  Lab 06/02/19 1929 06/02/19 1944 06/03/19 0453  WBC 7.8  --  9.0  NEUTROABS 5.4  --   --   HGB 12.4 12.9  12.9  HCT 40.3 38.0 41.8  MCV 86.3  --  86.4  PLT 173  --  164   Cardiac Enzymes: No results for input(s): CKTOTAL, CKMB, CKMBINDEX, TROPONINI in the last 168 hours. BNP (last 3 results) No results for input(s): PROBNP in the last 8760 hours. CBG: Recent Labs  Lab 06/02/19 1926  GLUCAP 90   D-Dimer: No results for input(s): DDIMER in the last 72 hours. Hgb A1c: No results for input(s): HGBA1C in the last 72 hours. Lipid Profile: No results for input(s): CHOL, HDL, LDLCALC, TRIG, CHOLHDL, LDLDIRECT in the last 72 hours. Thyroid function studies: No results for input(s): TSH, T4TOTAL, T3FREE, THYROIDAB in the last 72 hours.  Invalid input(s): FREET3 Anemia work up: No results for input(s): VITAMINB12, FOLATE, FERRITIN, TIBC, IRON, RETICCTPCT in the last 72 hours. Sepsis Labs: Recent Labs  Lab 06/02/19 1929 06/03/19 0453  WBC 7.8 9.0  LATICACIDVEN 1.0  --     Microbiology Recent Results (from the past 240 hour(s))  SARS Coronavirus 2 by RT PCR (hospital order, performed in [facility] hospital lab) Nasopharyngeal Nasopharyngeal Swab     Status: None   Collection Time: 06/02/19  7:29 PM   Specimen: Nasopharyngeal Swab  Result Value Ref Range Status   SARS Coronavirus 2 NEGATIVE NEGATIVE Final    Comment: (NOTE) SARS-CoV-2 target nucleic acids are NOT DETECTED. The SARS-CoV-2 RNA is generally detectable in upper and lower respiratory  specimens during the acute phase of infection. The lowest concentration of SARS-CoV-2 viral copies this assay can detect is 250 copies / mL. A negative result does not preclude SARS-CoV-2 infection and should not be used as the sole basis for treatment or other patient management decisions.  A negative result may occur with improper specimen collection / handling, submission of specimen other than nasopharyngeal swab, presence of viral mutation(s) within the areas targeted by this assay, and inadequate number of viral copies (<250 copies / mL). A negative result must be combined with clinical observations, patient history, and epidemiological information. Fact Sheet for Patients:   StrictlyIdeas.no Fact Sheet for Healthcare Providers: BankingDealers.co.za This test is not yet approved or cleared  by the [place] FDA and has been authorized for detection and/or diagnosis of SARS-CoV-2 by FDA under an Emergency Use Authorization (EUA).  This EUA will remain in effect (meaning this test can be used) for the duration of the COVID-19 declaration under Section 564(b)(1) of the Act, 21 U.S.C. section 360bbb-3(b)(1), unless the authorization is terminated or revoked sooner. Performed at [facility], [place] [address], [place], [place] [address]     Procedures and diagnostic studies:  CT Head Wo Contrast  Result Date: 06/02/2019 CLINICAL DATA:  Increased weakness.  New onset confusion. EXAM: CT HEAD WITHOUT CONTRAST TECHNIQUE: Contiguous axial images were obtained from the base of the skull through the vertex without intravenous contrast. COMPARISON:  None. FINDINGS: Brain: No subdural, epidural, or subarachnoid hemorrhage identified. Brainstem, basal cisterns, and cerebellum are normal. Ventricles and sulci are unremarkable. No acute cortical ischemia or infarct. No mass effect or midline shift. Vascular: Calcified atherosclerosis is seen  in the intracranial carotids. Skull: Normal. Negative for fracture or focal lesion. Sinuses/Orbits: No acute finding. Other: None. IMPRESSION: No acute intracranial abnormalities to explain the patient's acute mental status change. Electronically Signed   By: Dorise Bullion III M.D   On: 06/02/2019 20:59   CT Angio Chest PE W and/or Wo Contrast  Result Date: 06/03/2019 CLINICAL DATA:  Shortness of  breath.  Hypoxia. EXAM: CT ANGIOGRAPHY CHEST WITH CONTRAST TECHNIQUE: Multidetector CT imaging of the chest was performed using the standard protocol during bolus administration of intravenous contrast. Multiplanar CT image reconstructions and MIPs were obtained to evaluate the vascular anatomy. CONTRAST:  59mL OMNIPAQUE IOHEXOL 350 MG/ML SOLN COMPARISON:  CT chest dated March 26, 2019 FINDINGS: Cardiovascular: Contrast injection is sufficient to demonstrate satisfactory opacification of the pulmonary arteries to the segmental level. There is no pulmonary embolus. The right left pulmonary arteries are dilated. Heart size remains enlarged. There are atherosclerotic changes of the thoracic aorta. The patient is status post prior TAVR and median sternotomy. There are advanced coronary artery calcifications. There is a right-sided Port-A-Cath in place. Mediastinum/Nodes: --the previously demonstrated mediastinal adenopathy has significantly improved from the prior study in March. There are a few residual mildly enlarged lymph nodes. For example there is a 1.1 cm lymph node in the AP window (axial series 5, image 47), previously measuring approximately 2 cm. --No axillary lymphadenopathy. --No supraclavicular lymphadenopathy. --Normal thyroid gland. --The esophagus is unremarkable Lungs/Pleura: There are areas of subpleural reticulation and scarring bilaterally. There is elevation of the right hemidiaphragm. The previously demonstrated mass in the left lower lobe has significantly decreased in size now measuring  approximately 1.3 cm (previously measuring 3.2 cm). There is some mild bilateral pleural based thickening which has improved from the prior study. There is no pneumothorax. There is no significant pleural effusion Upper Abdomen: There are postsurgical changes of the stomach as before. The patient appears to be status post right nephrectomy. The patient is status post prior cholecystectomy. Musculoskeletal: No chest wall abnormality. No acute or significant osseous findings. Review of the MIP images confirms the above findings. IMPRESSION: 1. No acute pulmonary embolism. 2. Significant interval decrease in size of the previously demonstrated mediastinal and hilar lymph nodes with significant interval decrease in size of the previously demonstrated left lower lobe lung mass. Findings are consistent with a positive response to treatment. 3. Cardiomegaly with mild volume overload. 4. Additional chronic findings as detailed above. Aortic Atherosclerosis (ICD10-I70.0). Electronically Signed   By: Constance Holster M.D.   On: 06/03/2019 00:45   DG Chest Port 1 View  Result Date: 06/02/2019 CLINICAL DATA:  Shortness of breath. Hypoxia. EXAM: PORTABLE CHEST 1 VIEW COMPARISON:  January 10, 2018 FINDINGS: The heart size is enlarged. The patient is status post prior TAVR. There is a well-positioned right-sided Port-A-Cath in place. The patient is status post prior median sternotomy. There is vascular congestion with mild pulmonary edema. There is stable elevation of the right hemidiaphragm. There is no large focal infiltrate or pleural effusion. IMPRESSION: 1. Cardiomegaly with mild pulmonary edema. 2. Stable elevation of the right hemidiaphragm. 3. No large focal infiltrate or pleural effusion. Electronically Signed   By: Constance Holster M.D.   On: 06/02/2019 19:19    Medications:   . enoxaparin (LOVENOX) injection  40 mg Subcutaneous Q24H   Continuous Infusions: . cefTRIAXone (ROCEPHIN)  IV       LOS: 1 day    Radene Gunning NP  [facility]   How to contact the [facility] Attending or Consulting provider [place] or covering provider during after hours [place], for this patient?  1. Check the care team in [facility] and look for a) attending/consulting [facility] provider listed and b) the [place] team listed 2. Log into [email or web address] and use [place]'s universal password to access. If you do not have the password, please contact the hospital  operator. 3. Locate the [facility] provider you are looking for under [facility] and page to a number that you can be directly reached. 4. If you still have difficulty reaching the provider, please page the [facility] (Director on Call) for the Hospitalists listed on amion for assistance.  06/03/2019, 11:21 AM

## 2019-06-03 NOTE — Plan of Care (Signed)
  Problem: Education: Goal: Knowledge of General Education information will improve Description: Including pain rating scale, medication(s)/side effects and non-pharmacologic comfort measures Outcome: Progressing   Problem: Safety: Goal: Ability to remain free from injury will improve Outcome: Progressing   

## 2019-06-03 NOTE — Telephone Encounter (Signed)
leoPts dtr called [place] stating her mother was about out of her supply of Inlyta. Pt was admitted w/ UTI per caller.  I directed pt to contact [facility] for refill.  Kennith Center, Pharm.D., CPP 06/03/2019@12 :50 PM

## 2019-06-03 NOTE — Progress Notes (Signed)
Pharmacist Chemotherapy Monitoring - Follow Up Assessment    I verify that I have reviewed each item in the below checklist:  . Regimen for the patient is scheduled for the appropriate day and plan matches scheduled date. [name] Appropriate non-routine labs are ordered dependent on drug ordered. . If applicable, additional medications reviewed and ordered per protocol based on lifetime cumulative doses and/or treatment regimen.   Plan for follow-up and/or issues identified: No . I-vent associated with next due treatment: No . MD and/or nursing notified: No  [name] D 06/03/2019 3:37 PM

## 2019-06-04 LAB — BASIC METABOLIC PANEL
Anion gap: 6 (ref 5–15)
BUN: 21 mg/dL (ref 8–23)
CO2: 29 mmol/L (ref 22–32)
Calcium: 9.3 mg/dL (ref 8.9–10.3)
Chloride: 102 mmol/L (ref 98–111)
Creatinine, Ser: 1.73 mg/dL — ABNORMAL HIGH (ref 0.44–1.00)
GFR calc Af Amer: 33 mL/min — ABNORMAL LOW (ref >60)
GFR calc non Af Amer: 28 mL/min — ABNORMAL LOW (ref >60)
Glucose, Bld: 98 mg/dL (ref 70–99)
Potassium: 4.1 mmol/L (ref 3.5–5.1)
Sodium: 137 mmol/L (ref 135–145)

## 2019-06-04 MED ORDER — DOCUSATE SODIUM 100 MG PO CAPS
100.0000 mg | ORAL_CAPSULE | Freq: Two times a day (BID) | ORAL | Status: AC
  Administered 2019-06-04 (×2): 100 mg via ORAL
  Filled 2019-06-04 (×2): qty 1

## 2019-06-04 MED ORDER — SENNA 8.6 MG PO TABS: 1.0000 | ORAL_TABLET | Freq: Every day | ORAL | Status: DC | PRN

## 2019-06-04 NOTE — Social Work (Addendum)
CSW contacted by bedside RN, she gave this Probation officer through secure chat, pt's [facility] CSW information.  Carmell Austria, [place], [phone].   Westley Hummer, MSW, [place] Work

## 2019-06-04 NOTE — Progress Notes (Signed)
Chart reviewed today.  [name] is known to me with history of advanced renal neoplasm currently on active therapy with pembrolizumab and axitinib.  She was hospitalized with urinary tract infection and altered mental status in addition to hypoxia.  CT scan of the chest on Jun 03, 2019 was reviewed and showed no evidence of a pulmonary embolism or pleural effusion.  The scan showed positive response to therapy.  I agree with holding her anticancer treatment while she is hospitalized and will be resuming outpatient upon her discharge.  I agree with the current management and training for urinary tract infection as well as supportive measures.

## 2019-06-04 NOTE — Progress Notes (Signed)
Progress Note    [name]  [id number] DOB: Oct 05, 1940  DOA: 06/02/2019 PCP: [name], MD    Brief Narrative:    Medical records reviewed and are as summarized below:  [name] is an 79 y.o. female with a past medical history significant for CAD status post CABG, hypertension, chronic congestive heart failure, aortic stenosis status post TAVR, renal cell carcinoma status post nephrectomy, peripheral vascular disease, recently diagnosed metastatic disease admitted May 23 with acute encephalopathy, acute respiratory failure with hypoxia and a urinary tract infection.  She was provided with IV antibiotics gentle IV fluids and oxygen supplementation.  Progressing slowly but surely.  Assessment/Plan:   Principal Problem:   Acute respiratory failure with hypoxia (HCC) Active Problems:   AKI (acute kidney injury) ([place])   UTI (urinary tract infection)   Acute encephalopathy   CAD (coronary artery disease)   Aortic stenosis, severe   COPD (chronic obstructive pulmonary disease) (HCC)   Chronic diastolic CHF (congestive heart failure) (HCC)   Renal cell carcinoma (HCC)   GERD (gastroesophageal reflux disease)   Hypothyroidism   Anxiety   Tobacco abuse   S/P CABG x 4   #1.  Respiratory failure with hypoxia.  Upon presentation oxygen saturation level in the 80s.  Likely related to chronic heart failure in the setting of COPD.  Today oxygen saturation level 93% on room air.  Provided with 40 mg of Lasix intravenously in the emergency department.  Ambulated and oxygen saturation level remained at 94%. -We will continue with nebulizers -Continue inhalers -Incentive spirometry -Monitor  #2.  Acute encephalopathy.  Likely related to urinary tract infection.  Resolved this morning.  CT of the head with no acute intracranial abnormalities.  No metabolic derangements. -Continue IV antibiotics for urinary tract infection  #3.  Urinary tract infection.  Culture reveals  30,000 COLONIES/mL ESCHERICHIA COLI  SUSCEPTIBILITIES TO FOLLOW.  Will await sensitivities. -Follow sensitivities.  #4.  History of renal cell carcinoma.  Review indicates patient was diagnosed with renal cell carcinoma December 2019.  She underwent a radical nephrectomy.  She was found to have presumed metastatic disease March 2021.  Underwent Port-A-Cath placement 3 weeks ago.  Home medications include Inlyta.  Oncology note indicates agreement with holding anticancer treatment while in the hospital. -Outpatient follow-up  #5.  Chronic diastolic heart failure.  Compensated.  She did have a CT angio of the chest upon presentation which revealed mild volume overload.  She received IV Lasix 40 mg once.  Home medications include beta-blocker and Lasix as needed.  Echo done in July 2020 reveals an ejection fraction of 60 to 65% -Continue home meds -Monitor intake and output  #6.  COPD.  Not on home oxygen.  Not appearing to have an exacerbation. -See #1  #7.  Acute kidney injury.  Creatinine 1.73.  This is up from 1.39 on admission.  Likely related to CT angio. -Hold nephrotoxins -Monitor urine output -Recheck in the morning  #8.  Aortic stenosis/CAD status post CABG x4.  Status post TAVR.  EKG with left fascicular block.  No chest pain.  No events on telemetry -Continue home meds  #9..  Stable at baseline -Continue PPI  #10.  Hypothyroidism.  TSH is greater than 9. -Continue home Synthroid -Outpatient follow-up   Family Communication/Anticipated D/C date and plan/Code Status   DVT prophylaxis: Lovenox ordered. Code Status: Full Code.  Family Communication: patient Disposition Plan: Status is: Inpatient  Remains inpatient appropriate because:Inpatient level  of care appropriate due to severity of illness   Dispo: The patient is from: Home              Anticipated d/c is to: Home              Anticipated d/c date is: 1 day              Patient currently is not medically stable  to d/c.          Medical Consultants:    None.   Anti-Infectives:    Rocephin 5/23>>  Subjective:   Sitting up in bed.  Smiling.  Reports feeling "much better".  Objective:    Vitals:   06/04/19 0002 06/04/19 0604 06/04/19 0808 06/04/19 1223  BP: 131/65 131/67  113/62  Pulse: 60 (!) 57  64  Resp: 18 18  18   Temp: 97.9 F (36.6 C) 98 F (36.7 C)  98.8 F (37.1 C)  TempSrc: Oral Oral  Oral  SpO2: 94% 96% 93% 94%  Weight:      Height:        Intake/Output Summary (Last 24 hours) at 06/04/2019 1325 Last data filed at 06/04/2019 [id number] Gross per 24 hour  Intake 170 ml  Output 400 ml  Net -230 ml   Filed Weights   06/03/19 0208  Weight: 93.4 kg    Exam: General: Obese cooperative no acute distress CV: Regular rate and rhythm no murmur gallop or rub no lower extremity edema Respiratory: No increased work of breathing with conversation.  Breath sounds are clear I hear no crackles no wheezes Abdomen: Obese positive bowel sounds throughout nontender to palpation no mass organomegaly noted Musculoskeletal: Joints without swelling/erythema full range of motion Neuro: Alert and oriented x3 speech clear facial symmetry moves all extremities spontaneously  Data Reviewed:   I have personally reviewed following labs and imaging studies:  Labs: Labs show the following:   Basic Metabolic Panel: Recent Labs  Lab 06/02/19 1929 06/02/19 1929 06/02/19 1944 06/02/19 1944 06/03/19 0453 06/04/19 0539  NA 135  --  135  --  137 137  K 4.4   < > 4.2   < > 4.3 4.1  CL 103  --   --   --  101 102  CO2 22  --   --   --  28 29  GLUCOSE 111*  --   --   --  111* 98  BUN 21  --   --   --  17 21  CREATININE 1.39*  --   --   --  1.51* 1.73*  CALCIUM 9.6  --   --   --  9.7 9.3   < > = values in this interval not displayed.   GFR Estimated Creatinine Clearance: 32.8 mL/min (A) (by C-G formula based on SCr of 1.73 mg/dL (H)). Liver Function Tests: Recent Labs  Lab  06/02/19 1929 06/03/19 0453  AST 19 17  ALT 14 14  ALKPHOS 71 70  BILITOT 1.4* 0.9  PROT 6.5 6.7  ALBUMIN 3.1* 3.1*   No results for input(s): LIPASE, AMYLASE in the last 168 hours. Recent Labs  Lab 06/02/19 1929  AMMONIA 22   Coagulation profile No results for input(s): INR, PROTIME in the last 168 hours.  CBC: Recent Labs  Lab 06/02/19 1929 06/02/19 1944 06/03/19 0453  WBC 7.8  --  9.0  NEUTROABS 5.4  --   --   HGB 12.4 12.9 12.9  HCT 40.3 38.0  41.8  MCV 86.3  --  86.4  PLT 173  --  164   Cardiac Enzymes: No results for input(s): CKTOTAL, CKMB, CKMBINDEX, TROPONINI in the last 168 hours. BNP (last 3 results) No results for input(s): PROBNP in the last 8760 hours. CBG: Recent Labs  Lab 06/02/19 1926  GLUCAP 90   D-Dimer: No results for input(s): DDIMER in the last 72 hours. Hgb A1c: No results for input(s): HGBA1C in the last 72 hours. Lipid Profile: No results for input(s): CHOL, HDL, LDLCALC, TRIG, CHOLHDL, LDLDIRECT in the last 72 hours. Thyroid function studies: No results for input(s): TSH, T4TOTAL, T3FREE, THYROIDAB in the last 72 hours.  Invalid input(s): FREET3 Anemia work up: No results for input(s): VITAMINB12, FOLATE, FERRITIN, TIBC, IRON, RETICCTPCT in the last 72 hours. Sepsis Labs: Recent Labs  Lab 06/02/19 1929 06/03/19 0453  WBC 7.8 9.0  LATICACIDVEN 1.0  --     Microbiology Recent Results (from the past 240 hour(s))  SARS Coronavirus 2 by RT PCR (hospital order, performed in [facility] hospital lab) Nasopharyngeal Nasopharyngeal Swab     Status: None   Collection Time: 06/02/19  7:29 PM   Specimen: Nasopharyngeal Swab  Result Value Ref Range Status   SARS Coronavirus 2 NEGATIVE NEGATIVE Final    Comment: (NOTE) SARS-CoV-2 target nucleic acids are NOT DETECTED. The SARS-CoV-2 RNA is generally detectable in upper and lower respiratory specimens during the acute phase of infection. The lowest concentration of SARS-CoV-2 viral  copies this assay can detect is 250 copies / mL. A negative result does not preclude SARS-CoV-2 infection and should not be used as the sole basis for treatment or other patient management decisions.  A negative result may occur with improper specimen collection / handling, submission of specimen other than nasopharyngeal swab, presence of viral mutation(s) within the areas targeted by this assay, and inadequate number of viral copies (<250 copies / mL). A negative result must be combined with clinical observations, patient history, and epidemiological information. Fact Sheet for Patients:   StrictlyIdeas.no Fact Sheet for Healthcare Providers: BankingDealers.co.za This test is not yet approved or cleared  by the [place] FDA and has been authorized for detection and/or diagnosis of SARS-CoV-2 by FDA under an Emergency Use Authorization (EUA).  This EUA will remain in effect (meaning this test can be used) for the duration of the COVID-19 declaration under Section 564(b)(1) of the Act, 21 U.S.C. section 360bbb-3(b)(1), unless the authorization is terminated or revoked sooner. Performed at [facility], [place] [address] [place], [place] [address]   Urine Culture     Status: Abnormal (Preliminary result)   Collection Time: 06/02/19 10:06 PM   Specimen: Urine, Random  Result Value Ref Range Status   Specimen Description URINE, RANDOM  Final   Special Requests NONE  Final   Culture (A)  Final    30,000 COLONIES/mL ESCHERICHIA COLI SUSCEPTIBILITIES TO FOLLOW Performed at [facility], [place] [address] [place], [place] [address]    Report Status PENDING  Incomplete  MRSA PCR Screening     Status: None   Collection Time: 06/03/19  3:16 PM   Specimen: Nasal Mucosa; Nasopharyngeal  Result Value Ref Range Status   MRSA by PCR NEGATIVE NEGATIVE Final    Comment:        The GeneXpert MRSA Assay (FDA approved for NASAL  specimens only), is one component of a comprehensive MRSA colonization surveillance program. It is not intended to diagnose MRSA infection nor  to guide or monitor treatment for MRSA infections. Performed at [facility], [place] [address] [place], [place] [address]     Procedures and diagnostic studies:  CT Head Wo Contrast  Result Date: 06/02/2019 CLINICAL DATA:  Increased weakness.  New onset confusion. EXAM: CT HEAD WITHOUT CONTRAST TECHNIQUE: Contiguous axial images were obtained from the base of the skull through the vertex without intravenous contrast. COMPARISON:  None. FINDINGS: Brain: No subdural, epidural, or subarachnoid hemorrhage identified. Brainstem, basal cisterns, and cerebellum are normal. Ventricles and sulci are unremarkable. No acute cortical ischemia or infarct. No mass effect or midline shift. Vascular: Calcified atherosclerosis is seen in the intracranial carotids. Skull: Normal. Negative for fracture or focal lesion. Sinuses/Orbits: No acute finding. Other: None. IMPRESSION: No acute intracranial abnormalities to explain the patient's acute mental status change. Electronically Signed   By: Dorise Bullion III M.D   On: 06/02/2019 20:59   CT Angio Chest PE W and/or Wo Contrast  Result Date: 06/03/2019 CLINICAL DATA:  Shortness of breath.  Hypoxia. EXAM: CT ANGIOGRAPHY CHEST WITH CONTRAST TECHNIQUE: Multidetector CT imaging of the chest was performed using the standard protocol during bolus administration of intravenous contrast. Multiplanar CT image reconstructions and MIPs were obtained to evaluate the vascular anatomy. CONTRAST:  67mL OMNIPAQUE IOHEXOL 350 MG/ML SOLN COMPARISON:  CT chest dated March 26, 2019 FINDINGS: Cardiovascular: Contrast injection is sufficient to demonstrate satisfactory opacification of the pulmonary arteries to the segmental level. There is no pulmonary embolus. The right left pulmonary arteries are dilated. Heart size remains enlarged.  There are atherosclerotic changes of the thoracic aorta. The patient is status post prior TAVR and median sternotomy. There are advanced coronary artery calcifications. There is a right-sided Port-A-Cath in place. Mediastinum/Nodes: --the previously demonstrated mediastinal adenopathy has significantly improved from the prior study in March. There are a few residual mildly enlarged lymph nodes. For example there is a 1.1 cm lymph node in the AP window (axial series 5, image 47), previously measuring approximately 2 cm. --No axillary lymphadenopathy. --No supraclavicular lymphadenopathy. --Normal thyroid gland. --The esophagus is unremarkable Lungs/Pleura: There are areas of subpleural reticulation and scarring bilaterally. There is elevation of the right hemidiaphragm. The previously demonstrated mass in the left lower lobe has significantly decreased in size now measuring approximately 1.3 cm (previously measuring 3.2 cm). There is some mild bilateral pleural based thickening which has improved from the prior study. There is no pneumothorax. There is no significant pleural effusion Upper Abdomen: There are postsurgical changes of the stomach as before. The patient appears to be status post right nephrectomy. The patient is status post prior cholecystectomy. Musculoskeletal: No chest wall abnormality. No acute or significant osseous findings. Review of the MIP images confirms the above findings. IMPRESSION: 1. No acute pulmonary embolism. 2. Significant interval decrease in size of the previously demonstrated mediastinal and hilar lymph nodes with significant interval decrease in size of the previously demonstrated left lower lobe lung mass. Findings are consistent with a positive response to treatment. 3. Cardiomegaly with mild volume overload. 4. Additional chronic findings as detailed above. Aortic Atherosclerosis (ICD10-I70.0). Electronically Signed   By: Constance Holster M.D.   On: 06/03/2019 00:45   DG  Chest Port 1 View  Result Date: 06/02/2019 CLINICAL DATA:  Shortness of breath. Hypoxia. EXAM: PORTABLE CHEST 1 VIEW COMPARISON:  January 10, 2018 FINDINGS: The heart size is enlarged. The patient is status post prior TAVR. There is a well-positioned right-sided Port-A-Cath in place. The patient is status  post prior median sternotomy. There is vascular congestion with mild pulmonary edema. There is stable elevation of the right hemidiaphragm. There is no large focal infiltrate or pleural effusion. IMPRESSION: 1. Cardiomegaly with mild pulmonary edema. 2. Stable elevation of the right hemidiaphragm. 3. No large focal infiltrate or pleural effusion. Electronically Signed   By: Constance Holster M.D.   On: 06/02/2019 19:19    Medications:   . aspirin EC  81 mg Oral QHS  . Chlorhexidine Gluconate Cloth  6 each Topical Daily  . docusate sodium  100 mg Oral BID  . enoxaparin (LOVENOX) injection  40 mg Subcutaneous Q24H  . levothyroxine  75 mcg Oral Q0600  . mometasone-formoterol  2 puff Inhalation BID  . nebivolol  20 mg Oral Daily  . pantoprazole  40 mg Oral Daily  . simvastatin  40 mg Oral QHS   Continuous Infusions: . cefTRIAXone (ROCEPHIN)  IV 1 g (06/03/19 2144)     LOS: 2 days   Radene Gunning NP  [facility]   How to contact the [facility] Attending or Consulting provider [place] or covering provider during after hours [place], for this patient?  1. Check the care team in [facility] and look for a) attending/consulting [facility] provider listed and b) the [facility] team listed 2. Log into [email or web address] and use [place]'s universal password to access. If you do not have the password, please contact the hospital operator. 3. Locate the [facility] provider you are looking for under [facility] and page to a number that you can be directly reached. 4. If you still have difficulty reaching the provider, please page the [facility] (Director on Call) for the Hospitalists listed on amion for assistance.  06/04/2019,  1:25 PM

## 2019-06-05 ENCOUNTER — Other Ambulatory Visit: Payer: Self-pay

## 2019-06-05 LAB — BASIC METABOLIC PANEL
Anion gap: 9 (ref 5–15)
BUN: 21 mg/dL (ref 8–23)
CO2: 27 mmol/L (ref 22–32)
Calcium: 9.7 mg/dL (ref 8.9–10.3)
Chloride: 100 mmol/L (ref 98–111)
Creatinine, Ser: 1.49 mg/dL — ABNORMAL HIGH (ref 0.44–1.00)
GFR calc Af Amer: 39 mL/min — ABNORMAL LOW (ref >60)
GFR calc non Af Amer: 34 mL/min — ABNORMAL LOW (ref >60)
Glucose, Bld: 106 mg/dL — ABNORMAL HIGH (ref 70–99)
Potassium: 4.2 mmol/L (ref 3.5–5.1)
Sodium: 136 mmol/L (ref 135–145)

## 2019-06-05 LAB — URINE CULTURE: Culture: 30000 — AB

## 2019-06-05 MED ORDER — LEVOFLOXACIN 750 MG PO TABS
750.0000 mg | ORAL_TABLET | ORAL | 0 refills | Status: AC
Start: 2019-06-05 — End: 2019-06-08

## 2019-06-05 MED ORDER — LEVOTHYROXINE SODIUM 75 MCG PO TABS: 75.0000 ug | ORAL_TABLET | Freq: Every day | ORAL | 0 refills | Status: DC

## 2019-06-05 MED ORDER — AXITINIB 5 MG PO TABS: 5.0000 mg | ORAL_TABLET | Freq: Two times a day (BID) | ORAL | 0 refills | Status: DC

## 2019-06-05 MED FILL — INLYTA 5 MG TABLET: 5 | 30 days supply | Qty: 60 | Fill #0

## 2019-06-05 NOTE — Progress Notes (Signed)
Physical Therapy Treatment Patient Details Name: [name] MRN: [id number] DOB: 08-07-1940 Today's Date: 06/05/2019    History of Present Illness Patient is a 79 year old female admitted from home with confusion, weakness. Admitted with UTI and hypoxia. PMH includes: kidney CA, CABG, PAD, CAD, COPD,    PT Comments    Pt demonstrating good progress.  She required supervision for transfer and min cues for safety.  Pt c/o easily fatigued and leg weakness, but gait was stable. Pt with near 24 hr support and getting HHPT.   Cont to advance mobility as able.   Follow Up Recommendations  Home health PT;Supervision - Intermittent     Equipment Recommendations  None recommended by PT    Recommendations for Other Services       Precautions / Restrictions Precautions Precautions: Fall    Mobility  Bed Mobility               General bed mobility comments: Pt sitting EOB upon arrival  Transfers Overall transfer level: Needs assistance Equipment used: None Transfers: Sit to/from Stand Sit to Stand: Supervision            Ambulation/Gait Ambulation/Gait assistance: Supervision Gait Distance (Feet): 80 Feet Assistive device: (used hand rail) Gait Pattern/deviations: WFL(Within Functional Limits);Decreased stride length Gait velocity: decr   General Gait Details: generally steady but reports fatigue and weakness   Stairs             Wheelchair Mobility    Modified Rankin (Stroke Patients Only)       Balance Overall balance assessment: Needs assistance Sitting-balance support: Feet supported Sitting balance-Leahy Scale: Normal     Standing balance support: During functional activity Standing balance-Leahy Scale: Good                              Cognition Arousal/Alertness: Awake/alert Behavior During Therapy: WFL for tasks assessed/performed Overall Cognitive Status: Within Functional Limits for tasks assessed                                  General Comments: Reports doing much better than Sunday      Exercises      General Comments General comments (skin integrity, edema, etc.): On RA w/ O2 sats 96%      Pertinent Vitals/Pain Pain Assessment: No/denies pain    Home Living                      Prior Function            PT Goals (current goals can now be found in the care plan section) Acute Rehab PT Goals Patient Stated Goal: to return home, get strength back PT Goal Formulation: With patient Time For Goal Achievement: 06/17/19 Potential to Achieve Goals: Good Progress towards PT goals: Progressing toward goals    Frequency    Min 3X/week      PT Plan Current plan remains appropriate    Co-evaluation              AM-PAC PT "6 Clicks" Mobility   Outcome Measure  Help needed turning from your back to your side while in a flat bed without using bedrails?: None Help needed moving from lying on your back to sitting on the side of a flat bed without using bedrails?: None Help needed moving to and from  a bed to a chair (including a wheelchair)?: None Help needed standing up from a chair using your arms (e.g., wheelchair or bedside chair)?: None Help needed to walk in hospital room?: None Help needed climbing 3-5 steps with a railing? : A Little 6 Click Score: 23    End of Session Equipment Utilized During Treatment: Gait belt Activity Tolerance: Patient tolerated treatment well Patient left: in bed;with call bell/phone within reach Nurse Communication: Mobility status PT Visit Diagnosis: Muscle weakness (generalized) (M62.81);Difficulty in walking, not elsewhere classified (R26.2);History of falling (Z91.81)     Time: 1115-1130 PT Time Calculation (min) (ACUTE ONLY): 15 min  Charges:  $Gait Training: 8-22 mins                     Maggie Font, PT Acute Rehab Services Pager [phone] [facility] [phone] [facility]  [phone]    Karlton Lemon 06/05/2019, 11:34 AM

## 2019-06-05 NOTE — Discharge Summary (Signed)
Physician Discharge Summary  [name] INO:[id number] DOB: Jan 29, 1940 DOA: 06/02/2019  PCP: [name], MD  Admit date: 06/02/2019 Discharge date: 06/05/2019  Admitted From: Home Disposition: Home  Recommendations for Outpatient Follow-up:  1. Follow up with PCP in 1-2 weeks 2. Follow with oncology within a week as scheduled 3. Please obtain BMP/CBC in one week 4. Please follow up on the following pending results:  Home Health: None Equipment/Devices: None  Discharge Condition: Stable CODE STATUS: Full code Diet recommendation: Cardiac  Subjective: Seen and examined.  She feels much better.  Wants to go home.  Brief/Interim Summary: [name] is an 79 y.o. female with a past medical history significant for CAD status post CABG, hypertension, chronic congestive heart failure, aortic stenosis status post TAVR, renal cell carcinoma status post nephrectomy, peripheral vascular disease, recently diagnosed metastatic disease admitted May 23 with acute encephalopathy, acute respiratory failure with hypoxia and a urinary tract infection.  She was provided with IV antibiotics gentle IV fluids and oxygen supplementation.  Upon presentation, her oxygen saturation level was in 80s and this was likely secondary to acute on chronic diastolic congestive heart failure as patient had received Lasix in the emergency department which improved her symptoms and oxygen saturation.  She was then able to ambulate without any oxygen and her oxygen saturation remained at 94%.  Her home medications were continued.  Her acute metabolic encephalopathy was likely secondary to UTI.  For this, she was started on IV Rocephin.  Subsequently her urine culture grew E. coli which was pansensitive.  With treatment of UTI, her encephalopathy had resolved and she has remained alert and oriented since last 48 hours.  Due to acute infection, her cancer medications were held and this was in agreement with her oncologist  who has documented a small node as well.  She also had mild AKI on top of her chronic kidney disease stage IIIb which has improved and is at back to her baseline.  Her TSH was also greater than 9 so her Synthroid was increased from 75 mcg, her home dose 200 mcg.  Patient is being discharged in a stable condition today.  I have prescribed her 3 more doses of Levaquin 750 mg every 48 hours to treat a complicated UTI due to her history of renal cancer.  Discharge Diagnoses:  Principal Problem:   Acute respiratory failure with hypoxia (HCC) Active Problems:   CAD (coronary artery disease)   Tobacco abuse   Aortic stenosis, severe   GERD (gastroesophageal reflux disease)   S/P CABG x 4   Hypothyroidism   Anxiety   COPD (chronic obstructive pulmonary disease) (HCC)   AKI (acute kidney injury) (HCC)   Chronic diastolic CHF (congestive heart failure) (HCC)   Renal cell carcinoma (HCC)   UTI (urinary tract infection)   Acute encephalopathy    Discharge Instructions   Allergies as of 06/05/2019      Reactions   Chantix [varenicline Tartrate] Hives   Dipyridamole Hives, Rash      Medication List    TAKE these medications   acetaminophen 500 MG tablet Commonly known as: TYLENOL Take 1,000 mg by mouth every 6 (six) hours as needed for moderate pain or headache.   ALPRAZolam 0.25 MG tablet Commonly known as: XANAX Take 0.25 mg by mouth 3 (three) times daily as needed for anxiety.   amLODipine 10 MG tablet Commonly known as: NORVASC Take 1 tablet (10 mg total) by mouth daily.   amoxicillin 500  MG tablet Commonly known as: AMOXIL Take 4 tablets (2,000 mg total) by mouth as directed. 1 hour prior to dental work including cleanings   aspirin EC 81 MG tablet Take 81 mg by mouth at bedtime.   axitinib 5 MG tablet Commonly known as: Inlyta Take 1 tablet (5 mg total) by mouth 2 (two) times daily. What changed: See the new instructions.   Bystolic 20 MG Tabs Generic drug: Nebivolol  HCl Take 1 tablet (20 mg total) by mouth daily.   calcium elemental as carbonate 400 MG chewable tablet Commonly known as: BARIATRIC TUMS ULTRA Chew 1,000 mg by mouth 3 (three) times daily as needed for heartburn.   clobetasol ointment 0.05 % Commonly known as: TEMOVATE Apply 1 application topically 2 (two) times daily as needed (Rash).   diclofenac Sodium 1 % Gel Commonly known as: VOLTAREN Apply 4 g topically 3 (three) times daily as needed (Right knee pain).   FLUoxetine 40 MG capsule Commonly known as: PROZAC Take 40 mg by mouth every evening.   furosemide 20 MG tablet Commonly known as: LASIX Take 20 mg by mouth daily as needed for edema.   irbesartan 300 MG tablet Commonly known as: AVAPRO Take 1 tablet (300 mg total) by mouth daily.   levofloxacin 750 MG tablet Commonly known as: Levaquin Take 1 tablet (750 mg total) by mouth every other day for 3 days.   levothyroxine 75 MCG tablet Commonly known as: SYNTHROID Take 1 tablet (75 mcg total) by mouth daily at 6 (six) AM. Start taking on: Jun 06, 2019 What changed:   medication strength  how much to take  when to take this   loperamide 2 MG capsule Commonly known as: IMODIUM Take 2-4 mg by mouth 4 (four) times daily as needed for diarrhea or loose stools.   nitroGLYCERIN 0.4 MG SL tablet Commonly known as: NITROSTAT Place 0.4 mg under the tongue every 5 (five) minutes as needed for chest pain (MAX 3 TABLETS).   nystatin powder Commonly known as: MYCOSTATIN/NYSTOP Apply 1 application topically 3 (three) times daily.   pantoprazole 40 MG tablet Commonly known as: PROTONIX TAKE 1 TABLET BY MOUTH EVERY DAY   ProAir HFA 108 (90 Base) MCG/ACT inhaler Generic drug: albuterol Inhale 2 puffs into the lungs every 4 (four) hours as needed for shortness of breath or wheezing.   PROBIOTIC PO Take 1 capsule by mouth daily.   prochlorperazine 10 MG tablet Commonly known as: COMPAZINE Take 1 tablet (10 mg  total) by mouth every 6 (six) hours as needed for nausea or vomiting.   simethicone 125 MG chewable tablet Commonly known as: MYLICON Chew 016 mg by mouth every 6 (six) hours as needed for flatulence.   simvastatin 40 MG tablet Commonly known as: ZOCOR Take 1 tablet (40 mg total) by mouth at bedtime.   Symbicort 160-4.5 MCG/ACT inhaler Generic drug: budesonide-formoterol Inhale 1 puff into the lungs 2 (two) times daily as needed (for respiratory issues.).      Follow-up [facility] Follow up.        [name], MD Follow up in 1 week(s).   Specialty: Family Medicine Contact information: [place] [address] [phone]        Martinique, Peter M, MD .   Specialty: Cardiology Contact information: [address] [place] [address] [phone]          Allergies  Allergen Reactions  . Chantix [Varenicline  Tartrate] Hives  . Dipyridamole Hives and Rash    Consultations: Oncology   Procedures/Studies: CT Head Wo Contrast  Result Date: 06/02/2019 CLINICAL DATA:  Increased weakness.  New onset confusion. EXAM: CT HEAD WITHOUT CONTRAST TECHNIQUE: Contiguous axial images were obtained from the base of the skull through the vertex without intravenous contrast. COMPARISON:  None. FINDINGS: Brain: No subdural, epidural, or subarachnoid hemorrhage identified. Brainstem, basal cisterns, and cerebellum are normal. Ventricles and sulci are unremarkable. No acute cortical ischemia or infarct. No mass effect or midline shift. Vascular: Calcified atherosclerosis is seen in the intracranial carotids. Skull: Normal. Negative for fracture or focal lesion. Sinuses/Orbits: No acute finding. Other: None. IMPRESSION: No acute intracranial abnormalities to explain the patient's acute mental status change. Electronically Signed   By: Dorise Bullion III M.D   On: 06/02/2019 20:59   CT Angio Chest PE W and/or Wo  Contrast  Result Date: 06/03/2019 CLINICAL DATA:  Shortness of breath.  Hypoxia. EXAM: CT ANGIOGRAPHY CHEST WITH CONTRAST TECHNIQUE: Multidetector CT imaging of the chest was performed using the standard protocol during bolus administration of intravenous contrast. Multiplanar CT image reconstructions and MIPs were obtained to evaluate the vascular anatomy. CONTRAST:  88m OMNIPAQUE IOHEXOL 350 MG/ML SOLN COMPARISON:  CT chest dated March 26, 2019 FINDINGS: Cardiovascular: Contrast injection is sufficient to demonstrate satisfactory opacification of the pulmonary arteries to the segmental level. There is no pulmonary embolus. The right left pulmonary arteries are dilated. Heart size remains enlarged. There are atherosclerotic changes of the thoracic aorta. The patient is status post prior TAVR and median sternotomy. There are advanced coronary artery calcifications. There is a right-sided Port-A-Cath in place. Mediastinum/Nodes: --the previously demonstrated mediastinal adenopathy has significantly improved from the prior study in March. There are a few residual mildly enlarged lymph nodes. For example there is a 1.1 cm lymph node in the AP window (axial series 5, image 47), previously measuring approximately 2 cm. --No axillary lymphadenopathy. --No supraclavicular lymphadenopathy. --Normal thyroid gland. --The esophagus is unremarkable Lungs/Pleura: There are areas of subpleural reticulation and scarring bilaterally. There is elevation of the right hemidiaphragm. The previously demonstrated mass in the left lower lobe has significantly decreased in size now measuring approximately 1.3 cm (previously measuring 3.2 cm). There is some mild bilateral pleural based thickening which has improved from the prior study. There is no pneumothorax. There is no significant pleural effusion Upper Abdomen: There are postsurgical changes of the stomach as before. The patient appears to be status post right nephrectomy. The  patient is status post prior cholecystectomy. Musculoskeletal: No chest wall abnormality. No acute or significant osseous findings. Review of the MIP images confirms the above findings. IMPRESSION: 1. No acute pulmonary embolism. 2. Significant interval decrease in size of the previously demonstrated mediastinal and hilar lymph nodes with significant interval decrease in size of the previously demonstrated left lower lobe lung mass. Findings are consistent with a positive response to treatment. 3. Cardiomegaly with mild volume overload. 4. Additional chronic findings as detailed above. Aortic Atherosclerosis (ICD10-I70.0). Electronically Signed   By: CConstance HolsterM.D.   On: 06/03/2019 00:45   DG Chest Port 1 View  Result Date: 06/02/2019 CLINICAL DATA:  Shortness of breath. Hypoxia. EXAM: PORTABLE CHEST 1 VIEW COMPARISON:  January 10, 2018 FINDINGS: The heart size is enlarged. The patient is status post prior TAVR. There is a well-positioned right-sided Port-A-Cath in place. The patient is status post prior median sternotomy. There is vascular congestion with mild pulmonary edema. There is  stable elevation of the right hemidiaphragm. There is no large focal infiltrate or pleural effusion. IMPRESSION: 1. Cardiomegaly with mild pulmonary edema. 2. Stable elevation of the right hemidiaphragm. 3. No large focal infiltrate or pleural effusion. Electronically Signed   By: Constance Holster M.D.   On: 06/02/2019 19:19   IR IMAGING GUIDED PORT INSERTION  Result Date: 05/09/2019 INDICATION: History of metastatic renal cell carcinoma. In need of durable intravenous access for chemotherapy administration. EXAM: IMPLANTED PORT A CATH PLACEMENT WITH ULTRASOUND AND FLUOROSCOPIC GUIDANCE COMPARISON:  Chest CT - 04/18/2017 MEDICATIONS: Ancef 2 gm IV; The antibiotic was administered within an appropriate time interval prior to skin puncture. ANESTHESIA/SEDATION: Moderate (conscious) sedation was employed during this  procedure. A total of Versed 2 mg and Fentanyl 100 mcg was administered intravenously. Moderate Sedation Time: 26 minutes. The patient's level of consciousness and vital signs were monitored continuously by radiology nursing throughout the procedure under my direct supervision. CONTRAST:  None FLUOROSCOPY TIME:  24 seconds (22 mGy) COMPLICATIONS: None immediate. PROCEDURE: The procedure, risks, benefits, and alternatives were explained to the patient. Questions regarding the procedure were encouraged and answered. The patient understands and consents to the procedure. The right neck and chest were prepped with chlorhexidine in a sterile fashion, and a sterile drape was applied covering the operative field. Maximum barrier sterile technique with sterile gowns and gloves were used for the procedure. A timeout was performed prior to the initiation of the procedure. Local anesthesia was provided with 1% lidocaine with epinephrine. After creating a small venotomy incision, a micropuncture kit was utilized to access the internal jugular vein. Real-time ultrasound guidance was utilized for vascular access including the acquisition of a permanent ultrasound image documenting patency of the accessed vessel. The microwire was utilized to measure appropriate catheter length. A subcutaneous port pocket was then created along the upper chest wall utilizing a combination of sharp and blunt dissection. The pocket was irrigated with sterile saline. A single lumen "Slim" sized power injectable port was chosen for placement. The 8 Fr catheter was tunneled from the port pocket site to the venotomy incision. The port was placed in the pocket. The external catheter was trimmed to appropriate length. At the venotomy, an 8 Fr peel-away sheath was placed over a guidewire under fluoroscopic guidance. The catheter was then placed through the sheath and the sheath was removed. Final catheter positioning was confirmed and documented with a  fluoroscopic spot radiograph. The port was accessed with a Huber needle, aspirated and flushed with heparinized saline. The venotomy site was closed with an interrupted 4-0 Vicryl suture. The port pocket incision was closed with interrupted 2-0 Vicryl suture. The skin was opposed with a running subcuticular 4-0 Vicryl suture. Dermabond and Steri-strips were applied to both incisions. Dressings were applied. The patient tolerated the procedure well without immediate post procedural complication. FINDINGS: After catheter placement, the tip lies within the superior cavoatrial junction. The catheter aspirates and flushes normally and is ready for immediate use. IMPRESSION: Successful placement of a right internal jugular approach power injectable Port-A-Cath. The catheter is ready for immediate use. Electronically Signed   By: Sandi Mariscal M.D.   On: 05/09/2019 13:04      Discharge Exam: Vitals:   06/05/19 0013 06/05/19 0449  BP: 106/62 (!) 148/73  Pulse: 62 (!) 57  Resp: 16 16  Temp: 97.8 F (36.6 C) 97.8 F (36.6 C)  SpO2: 91% 95%   Vitals:   06/04/19 1223 06/04/19 1746 06/05/19 0013 06/05/19 0449  BP: 113/62 110/66 106/62 (!) 148/73  Pulse: 64 60 62 (!) 57  Resp: 18 18 16 16   Temp: 98.8 F (37.1 C) 97.9 F (36.6 C) 97.8 F (36.6 C) 97.8 F (36.6 C)  TempSrc: Oral Oral Oral Oral  SpO2: 94% 94% 91% 95%  Weight:      Height:        General: Pt is alert, awake, not in acute distress Cardiovascular: RRR, S1/S2 +, no rubs, no gallops Respiratory: CTA bilaterally, no wheezing, no rhonchi Abdominal: Soft, NT, ND, bowel sounds + Extremities: no edema, no cyanosis    The results of significant diagnostics from this hospitalization (including imaging, microbiology, ancillary and laboratory) are listed below for reference.     Microbiology: Recent Results (from the past 240 hour(s))  SARS Coronavirus 2 by RT PCR (hospital order, performed in [facility] hospital lab) Nasopharyngeal  Nasopharyngeal Swab     Status: None   Collection Time: 06/02/19  7:29 PM   Specimen: Nasopharyngeal Swab  Result Value Ref Range Status   SARS Coronavirus 2 NEGATIVE NEGATIVE Final    Comment: (NOTE) SARS-CoV-2 target nucleic acids are NOT DETECTED. The SARS-CoV-2 RNA is generally detectable in upper and lower respiratory specimens during the acute phase of infection. The lowest concentration of SARS-CoV-2 viral copies this assay can detect is 250 copies / mL. A negative result does not preclude SARS-CoV-2 infection and should not be used as the sole basis for treatment or other patient management decisions.  A negative result may occur with improper specimen collection / handling, submission of specimen other than nasopharyngeal swab, presence of viral mutation(s) within the areas targeted by this assay, and inadequate number of viral copies (<250 copies / mL). A negative result must be combined with clinical observations, patient history, and epidemiological information. Fact Sheet for Patients:   StrictlyIdeas.no Fact Sheet for Healthcare Providers: BankingDealers.co.za This test is not yet approved or cleared  by the [place] FDA and has been authorized for detection and/or diagnosis of SARS-CoV-2 by FDA under an Emergency Use Authorization (EUA).  This EUA will remain in effect (meaning this test can be used) for the duration of the COVID-19 declaration under Section 564(b)(1) of the Act, 21 U.S.C. section 360bbb-3(b)(1), unless the authorization is terminated or revoked sooner. Performed at [facility], [place] [address], [place], [place] [address]   Urine Culture     Status: Abnormal   Collection Time: 06/02/19 10:06 PM   Specimen: Urine, Random  Result Value Ref Range Status   Specimen Description URINE, RANDOM  Final   Special Requests   Final    NONE Performed at [facility], [place] [address],  [place], [place] [address]    Culture 30,000 COLONIES/mL ESCHERICHIA COLI (A)  Final   Report Status 06/05/2019 FINAL  Final   Organism ID, Bacteria ESCHERICHIA COLI (A)  Final      Susceptibility   Escherichia coli - MIC*    AMPICILLIN <=2 SENSITIVE Sensitive     CEFAZOLIN <=4 SENSITIVE Sensitive     CEFTRIAXONE <=1 SENSITIVE Sensitive     CIPROFLOXACIN <=0.25 SENSITIVE Sensitive     GENTAMICIN <=1 SENSITIVE Sensitive     IMIPENEM <=0.25 SENSITIVE Sensitive     NITROFURANTOIN <=16 SENSITIVE Sensitive     TRIMETH/SULFA <=20 SENSITIVE Sensitive     AMPICILLIN/SULBACTAM <=2 SENSITIVE Sensitive     PIP/TAZO <=4 SENSITIVE Sensitive     * 30,000 COLONIES/mL ESCHERICHIA COLI  MRSA PCR Screening  Status: None   Collection Time: 06/03/19  3:16 PM   Specimen: Nasal Mucosa; Nasopharyngeal  Result Value Ref Range Status   MRSA by PCR NEGATIVE NEGATIVE Final    Comment:        The GeneXpert MRSA Assay (FDA approved for NASAL specimens only), is one component of a comprehensive MRSA colonization surveillance program. It is not intended to diagnose MRSA infection nor to guide or monitor treatment for MRSA infections. Performed at [facility], [place] [address], [place], [place] [address]      Labs: BNP (last 3 results) Recent Labs    06/02/19 1929  BNP 696.7*   Basic Metabolic Panel: Recent Labs  Lab 06/02/19 1929 06/02/19 1944 06/03/19 0453 06/04/19 0539 06/05/19 0458  NA 135 135 137 137 136  K 4.4 4.2 4.3 4.1 4.2  CL 103  --  101 102 100  CO2 22  --  28 29 27   GLUCOSE 111*  --  111* 98 106*  BUN 21  --  17 21 21   CREATININE 1.39*  --  1.51* 1.73* 1.49*  CALCIUM 9.6  --  9.7 9.3 9.7   Liver Function Tests: Recent Labs  Lab 06/02/19 1929 06/03/19 0453  AST 19 17  ALT 14 14  ALKPHOS 71 70  BILITOT 1.4* 0.9  PROT 6.5 6.7  ALBUMIN 3.1* 3.1*   No results for input(s): LIPASE, AMYLASE in the last 168 hours. Recent Labs  Lab 06/02/19 1929  AMMONIA 22    CBC: Recent Labs  Lab 06/02/19 1929 06/02/19 1944 06/03/19 0453  WBC 7.8  --  9.0  NEUTROABS 5.4  --   --   HGB 12.4 12.9 12.9  HCT 40.3 38.0 41.8  MCV 86.3  --  86.4  PLT 173  --  164   Cardiac Enzymes: No results for input(s): CKTOTAL, CKMB, CKMBINDEX, TROPONINI in the last 168 hours. BNP: Invalid input(s): POCBNP CBG: Recent Labs  Lab 06/02/19 1926  GLUCAP 90   D-Dimer No results for input(s): DDIMER in the last 72 hours. Hgb A1c No results for input(s): HGBA1C in the last 72 hours. Lipid Profile No results for input(s): CHOL, HDL, LDLCALC, TRIG, CHOLHDL, LDLDIRECT in the last 72 hours. Thyroid function studies No results for input(s): TSH, T4TOTAL, T3FREE, THYROIDAB in the last 72 hours.  Invalid input(s): FREET3 Anemia work up No results for input(s): VITAMINB12, FOLATE, FERRITIN, TIBC, IRON, RETICCTPCT in the last 72 hours. Urinalysis    Component Value Date/Time   COLORURINE YELLOW 06/02/2019 2206   APPEARANCEUR CLOUDY (A) 06/02/2019 2206   LABSPEC 1.011 06/02/2019 2206   PHURINE 5.0 06/02/2019 2206   GLUCOSEU NEGATIVE 06/02/2019 2206   HGBUR MODERATE (A) 06/02/2019 2206   BILIRUBINUR NEGATIVE 06/02/2019 2206   KETONESUR NEGATIVE 06/02/2019 2206   PROTEINUR NEGATIVE 06/02/2019 2206   UROBILINOGEN 0.2 10/24/2007 1052   NITRITE NEGATIVE 06/02/2019 2206   LEUKOCYTESUR LARGE (A) 06/02/2019 2206   Sepsis Labs Invalid input(s): PROCALCITONIN,  WBC,  LACTICIDVEN Microbiology Recent Results (from the past 240 hour(s))  SARS Coronavirus 2 by RT PCR (hospital order, performed in [facility] hospital lab) Nasopharyngeal Nasopharyngeal Swab     Status: None   Collection Time: 06/02/19  7:29 PM   Specimen: Nasopharyngeal Swab  Result Value Ref Range Status   SARS Coronavirus 2 NEGATIVE NEGATIVE Final    Comment: (NOTE) SARS-CoV-2 target nucleic acids are NOT DETECTED. The SARS-CoV-2 RNA is generally detectable in upper and lower respiratory specimens  during  the acute phase of infection. The lowest concentration of SARS-CoV-2 viral copies this assay can detect is 250 copies / mL. A negative result does not preclude SARS-CoV-2 infection and should not be used as the sole basis for treatment or other patient management decisions.  A negative result may occur with improper specimen collection / handling, submission of specimen other than nasopharyngeal swab, presence of viral mutation(s) within the areas targeted by this assay, and inadequate number of viral copies (<250 copies / mL). A negative result must be combined with clinical observations, patient history, and epidemiological information. Fact Sheet for Patients:   StrictlyIdeas.no Fact Sheet for Healthcare Providers: BankingDealers.co.za This test is not yet approved or cleared  by the [place] FDA and has been authorized for detection and/or diagnosis of SARS-CoV-2 by FDA under an Emergency Use Authorization (EUA).  This EUA will remain in effect (meaning this test can be used) for the duration of the COVID-19 declaration under Section 564(b)(1) of the Act, 21 U.S.C. section 360bbb-3(b)(1), unless the authorization is terminated or revoked sooner. Performed at [facility], [place] [address], [place], [place] [address]   Urine Culture     Status: Abnormal   Collection Time: 06/02/19 10:06 PM   Specimen: Urine, Random  Result Value Ref Range Status   Specimen Description URINE, RANDOM  Final   Special Requests   Final    NONE Performed at [facility], [place] [address], [place], [place] [address]    Culture 30,000 COLONIES/mL ESCHERICHIA COLI (A)  Final   Report Status 06/05/2019 FINAL  Final   Organism ID, Bacteria ESCHERICHIA COLI (A)  Final      Susceptibility   Escherichia coli - MIC*    AMPICILLIN <=2 SENSITIVE Sensitive     CEFAZOLIN <=4 SENSITIVE Sensitive     CEFTRIAXONE <=1 SENSITIVE Sensitive      CIPROFLOXACIN <=0.25 SENSITIVE Sensitive     GENTAMICIN <=1 SENSITIVE Sensitive     IMIPENEM <=0.25 SENSITIVE Sensitive     NITROFURANTOIN <=16 SENSITIVE Sensitive     TRIMETH/SULFA <=20 SENSITIVE Sensitive     AMPICILLIN/SULBACTAM <=2 SENSITIVE Sensitive     PIP/TAZO <=4 SENSITIVE Sensitive     * 30,000 COLONIES/mL ESCHERICHIA COLI  MRSA PCR Screening     Status: None   Collection Time: 06/03/19  3:16 PM   Specimen: Nasal Mucosa; Nasopharyngeal  Result Value Ref Range Status   MRSA by PCR NEGATIVE NEGATIVE Final    Comment:        The GeneXpert MRSA Assay (FDA approved for NASAL specimens only), is one component of a comprehensive MRSA colonization surveillance program. It is not intended to diagnose MRSA infection nor to guide or monitor treatment for MRSA infections. Performed at [facility], [place] [address] [place], [place] [address]      Time coordinating discharge: Over 30 minutes  SIGNED:   Darliss Cheney, MD  [facility] 06/05/2019, 9:42 AM  If 7PM-7AM, please contact night-coverage [email or web address]

## 2019-06-05 NOTE — Telephone Encounter (Signed)
TC from specialty pharmacy at [facility] asking to change quantity from 30 to 60  on Inlyta prescription. Per Dr. Alen Blew quantity changed new prescription sent.

## 2019-06-05 NOTE — Discharge Instructions (Signed)
Toxic Metabolic Encephalopathy Toxic metabolic encephalopathy (TME) is a type of brain disorder caused by a change in brain chemistry. This condition may result from illnesses or conditions that cause an imbalance of fluid, minerals (electrolytes), and other substances in the body that affect the way the brain functions. It is not caused by brain damage or brain disease. TME can cause confusion and other mental disturbances, which are generally referred to as delirium. Untreated delirium may lead to permanent mental changes or worsening medical conditions. Untreated delirium is a life-threatening condition that may need to be treated in the hospital. What are the causes? Possible causes of TME that can lead to deliriuminclude:  Short-term (acute) or long-term (chronic) disease of the kidney or liver.  Not having enough fluid in the body (dehydration).  Changes in the acid level (pH) of the blood.  High or low levels of any of the following substances in the blood: ? Calcium. ? Salt (sodium). ? Sugar (glucose). ? Magnesium. ? Phosphate.  High body temperature.  Not having enough oxygen in the blood.  Low levels of B vitamins. This can result from alcohol abuse.  Certain medicines, such as steroids and medicines that reduce the activity of the immune system (immunosuppressants).  Certain infections. What increases the risk? You may have a higher risk for TME if you:  Are elderly.  Have dementia.  Are in the hospital, especially in intensive care.  Live in a nursing home.  Had recent surgery.  Have liver or kidney disease.  Have poorly controlled diabetes.  Have chronic medical problems, especially heart or lung disease.  Are not getting enough fluids.  Have poor nutrition.  Abuse alcohol. What are the signs or symptoms? Symptoms of TME may include:  Muscle stiffness or jerking (spasticity).  Shaking (tremors).  Flapping of the  hands.  Weakness.  Clumsiness.  Slowed breathing.  Jerky movements that you cannot control (seizures).  Not being able to stay awake (drowsiness).  Not being able to pay attention.  Loss of consciousness (coma). Symptoms of delirium caused by TME include:  Confusion.  Difficulty focusing or concentrating, or inability to focus or concentrate.  Not knowing where you are (disorientation).  Seeing or hearing things that are not real (hallucinations).  Fearfulness.  False beliefs (delusions).  Changes in mood or personality.  Changes in speech, such as saying things that do not make sense.  Memory loss.  Irritability.  Avoiding other people (withdrawal).  Depression.  Poor judgment.  Changes in eating and sleeping patterns.  Hyperactivity.  Decreased alertness.  General mistrust of others (paranoia). Delirium may come and go. Symptoms of delirium may start suddenly or gradually, and they often get worse at night. How is this diagnosed? This condition is diagnosed based on:  Your symptoms and behavior.  An exam to check how you are thinking, feeling, and behaving (mental status exam). To diagnose delirium, the mental status exam must rule out other possible causes of TME, and must show: ? Changes in attention and awareness. ? Changes that develop over a short period of time and tend to come and go (fluctuate). ? Changes in memory, language, and thinking that were not present before.  A physical exam.  Imaging tests, such as: ? MRI. ? CT scan.  Blood tests to: ? Measure liver and kidney function. ? Check for a lack (deficiency) of vitamin B. ? Check for changes in acid levels (pH) and changes in calcium, sodium, or magnesium levels in the blood. ?  Measure your blood sugar (glucose). ? Measure your blood oxygen level. How is this treated? Treatment for TME depends on the cause, and it may include.  Getting fluids through an IV tube.  Regulating  calcium, sodium, glucose, or magnesium levels in the body.  Getting oxygen.  Improving nutrition.  Treating liver or kidney disease.  Adjusting certain medicines.  Treating infections. If the cause is found and treated, delirium usually improves. Managing delirium may include:  Keeping the room well-lit and quiet.  Using calendars, pictures, and clocks to prevent disorientation.  Having frequent checks from nursing staff and visits from caregivers.  Wearing eyeglasses or a hearing aid, if needed.  Physical therapy.  Medicine to treat agitation, anxiety, hallucinations, or delusions. Follow these instructions at home:  Drink enough fluid to keep your urine clear or pale yellow.  Take over-the-counter and prescription medicines only as told by your health care provider.  Return to your normal activities as told by your health care provider. Ask your health care provider what activities are safe for you.  Follow a healthy diet. Do not skip meals.  Do not drink alcohol.  Go to bed at the same time every night.  Keep all follow-up visits as told by your health care provider. This is important. Contact a health care provider if:  You are unable to feed yourself or hydrate yourself.  You need help at home.  You start to feel clumsy.  You start to have tremors or weakness. Get help right away if:  You have a seizure.  You lose consciousness.  You have trouble breathing.  You do not feel able to care for yourself at home.  You have a fever.  You become disoriented at home. This information is not intended to replace advice given to you by your health care provider. Make sure you discuss any questions you have with your health care provider. Document Revised: 12/09/2016 Document Reviewed: 06/05/2015 Elsevier Patient Education  2020 [employer].

## 2019-06-05 NOTE — Progress Notes (Signed)
RN gave pt discharge instructions and she stated understanding. 2 new medication escribed to pt home pharmacy. IV has been removed and pt is getting dressed waiting for her ride

## 2019-06-07 ENCOUNTER — Inpatient Hospital Stay (HOSPITAL_BASED_OUTPATIENT_CLINIC_OR_DEPARTMENT_OTHER): Payer: Medicare Other | Admitting: Oncology

## 2019-06-07 ENCOUNTER — Other Ambulatory Visit: Payer: Self-pay

## 2019-06-07 ENCOUNTER — Inpatient Hospital Stay: Payer: Medicare Other

## 2019-06-07 VITALS — BP 133/75 | HR 66 | Temp 97.6°F | Resp 18 | Ht 67.5 in | Wt 207.2 lb

## 2019-06-07 DIAGNOSIS — C649 Malignant neoplasm of unspecified kidney, except renal pelvis: Secondary | ICD-10-CM

## 2019-06-07 DIAGNOSIS — C7800 Secondary malignant neoplasm of unspecified lung: Secondary | ICD-10-CM | POA: Diagnosis not present

## 2019-06-07 DIAGNOSIS — Z79899 Other long term (current) drug therapy: Secondary | ICD-10-CM | POA: Diagnosis not present

## 2019-06-07 DIAGNOSIS — C641 Malignant neoplasm of right kidney, except renal pelvis: Secondary | ICD-10-CM

## 2019-06-07 DIAGNOSIS — Z5112 Encounter for antineoplastic immunotherapy: Secondary | ICD-10-CM | POA: Diagnosis present

## 2019-06-07 LAB — CBC WITH DIFFERENTIAL (CANCER CENTER ONLY)
Abs Immature Granulocytes: 0.02 10*3/uL (ref 0.00–0.07)
Basophils Absolute: 0.1 10*3/uL (ref 0.0–0.1)
Basophils Relative: 1 %
Eosinophils Absolute: 0.2 10*3/uL (ref 0.0–0.5)
Eosinophils Relative: 2 %
HCT: 37.2 % (ref 36.0–46.0)
Hemoglobin: 11.6 g/dL — ABNORMAL LOW (ref 12.0–15.0)
Immature Granulocytes: 0 %
Lymphocytes Relative: 21 %
Lymphs Abs: 1.6 10*3/uL (ref 0.7–4.0)
MCH: 26.5 pg (ref 26.0–34.0)
MCHC: 31.2 g/dL (ref 30.0–36.0)
MCV: 85.1 fL (ref 80.0–100.0)
Monocytes Absolute: 0.8 10*3/uL (ref 0.1–1.0)
Monocytes Relative: 10 %
Neutro Abs: 5.2 10*3/uL (ref 1.7–7.7)
Neutrophils Relative %: 66 %
Platelet Count: 154 10*3/uL (ref 150–400)
RBC: 4.37 MIL/uL (ref 3.87–5.11)
RDW: 14.8 % (ref 11.5–15.5)
WBC Count: 7.9 10*3/uL (ref 4.0–10.5)
nRBC: 0 % (ref 0.0–0.2)

## 2019-06-07 LAB — CMP (CANCER CENTER ONLY)
ALT: 9 U/L (ref 0–44)
AST: 16 U/L (ref 15–41)
Albumin: 3.2 g/dL — ABNORMAL LOW (ref 3.5–5.0)
Alkaline Phosphatase: 71 U/L (ref 38–126)
Anion gap: 11 (ref 5–15)
BUN: 19 mg/dL (ref 8–23)
CO2: 22 mmol/L (ref 22–32)
Calcium: 9.6 mg/dL (ref 8.9–10.3)
Chloride: 103 mmol/L (ref 98–111)
Creatinine: 1.28 mg/dL — ABNORMAL HIGH (ref 0.44–1.00)
GFR, Est AFR Am: 47 mL/min — ABNORMAL LOW (ref >60)
GFR, Estimated: 41 mL/min — ABNORMAL LOW (ref >60)
Glucose, Bld: 103 mg/dL — ABNORMAL HIGH (ref 70–99)
Potassium: 4.2 mmol/L (ref 3.5–5.1)
Sodium: 136 mmol/L (ref 135–145)
Total Bilirubin: 0.7 mg/dL (ref 0.3–1.2)
Total Protein: 6.9 g/dL (ref 6.5–8.1)

## 2019-06-07 LAB — TSH: TSH: 58.136 u[IU]/mL — ABNORMAL HIGH (ref 0.308–3.960)

## 2019-06-07 MED ORDER — SODIUM CHLORIDE 0.9 % IV SOLN
Freq: Once | INTRAVENOUS | Status: AC
  Filled 2019-06-07: qty 250

## 2019-06-07 MED ORDER — SODIUM CHLORIDE 0.9% FLUSH
10.0000 mL | INTRAVENOUS | Status: DC | PRN
  Administered 2019-06-07: 10 mL
  Filled 2019-06-07: qty 10

## 2019-06-07 MED ORDER — SODIUM CHLORIDE 0.9 % IV SOLN
200.0000 mg | Freq: Once | INTRAVENOUS | Status: AC
  Administered 2019-06-07: 200 mg via INTRAVENOUS
  Filled 2019-06-07: qty 8

## 2019-06-07 MED ORDER — HEPARIN SOD (PORK) LOCK FLUSH 100 UNIT/ML IV SOLN
500.0000 [IU] | Freq: Once | INTRAVENOUS | Status: AC | PRN
  Administered 2019-06-07: 500 [IU]
  Filled 2019-06-07: qty 5

## 2019-06-07 NOTE — Progress Notes (Signed)
Hematology and Oncology Follow Up Visit  [name] [id number] [birth year] 79 y.o. 06/07/2019 9:34 AM [name], MDBadger, Mindy Alert, MD   Principle Diagnosis: 79 year old woman with renal cell carcinoma diagnosed in 2019.  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  Prior Therapy: She underwent right laparoscopic radical nephrectomy completed by Dr. Alinda Money on December 28, 2017.  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  The final pathological stage was T1b.     Current therapy: Pembrolizumab 200 mg every 3 weeks started on April 25, 2019 with axitinib 5 mg daily.  She is here for cycle 3 of therapy.  Interim History: [name] presents today for a follow-up visit.  Since the last visit, she was hospitalized briefly between May 23 and May 26 for shortness of breath and hypoxia.  During her evaluation, she was found to have a urinary tract infection and was treated with antibiotics with improvement in her clinical status.  She also had a CT scan of the chest which ruled out pulmonary embolism.  Since her discharge, she feels overall well without any recent decline in her performance status and activity level.  She is ambulating with the help of all walker without any falls or syncope.  She does report some mild fatigue and tiredness but no other complaints.  He denies any nausea or abdominal pain.  He denies any diarrhea or respiratory complaints.      Medications: Reviewed and updated. Current Outpatient Medications  Medication Sig Dispense Refill  . acetaminophen (TYLENOL) 500 MG tablet Take 1,000 mg by mouth every 6 (six) hours as needed for moderate pain or headache.     . ALPRAZolam (XANAX) 0.25 MG tablet Take 0.25 mg by mouth 3 (three) times daily as needed for anxiety.  0  . amLODipine (NORVASC) 10 MG tablet Take 1 tablet (10 mg total) by mouth daily. 90 tablet 1   . amoxicillin (AMOXIL) 500 MG tablet Take 4 tablets (2,000 mg total) by mouth as directed. 1 hour prior to dental work including cleanings (Patient not taking: Reported on 06/03/2019) 12 tablet 12  . aspirin EC 81 MG tablet Take 81 mg by mouth at bedtime.     [name] axitinib (INLYTA) 5 MG tablet Take 1 tablet (5 mg total) by mouth 2 (two) times daily. 60 tablet 0  . calcium elemental as carbonate (BARIATRIC TUMS ULTRA) 400 MG chewable tablet Chew 1,000 mg by mouth 3 (three) times daily as needed for heartburn.    . clobetasol ointment (TEMOVATE) [id number] % Apply 1 application topically 2 (two) times daily as needed (Rash).     . diclofenac Sodium (VOLTAREN) 1 % GEL Apply 4 g topically 3 (three) times daily as needed (Right knee pain).     [name] FLUoxetine (PROZAC) 40 MG capsule Take 40 mg by mouth every evening.    . furosemide (LASIX) 20 MG tablet Take 20 mg by mouth daily as needed for edema.    . irbesartan (AVAPRO) 300 MG tablet Take 1 tablet (300 mg total) by mouth daily. 90 tablet 2  . levofloxacin (LEVAQUIN) 750 MG tablet Take 1 tablet (750 mg total) by mouth every other day for 3 days. 3 tablet 0  . levothyroxine (SYNTHROID) 75 MCG tablet Take 1 tablet (75 mcg total) by mouth daily at 6 (six) AM. 30 tablet 0  . loperamide (IMODIUM) 2 MG capsule Take 2-4 mg by mouth  4 (four) times daily as needed for diarrhea or loose stools.     . Nebivolol HCl (BYSTOLIC) 20 MG TABS Take 1 tablet (20 mg total) by mouth daily. 30 tablet 11  . nitroGLYCERIN (NITROSTAT) 0.4 MG SL tablet Place 0.4 mg under the tongue every 5 (five) minutes as needed for chest pain (MAX 3 TABLETS).    [name] nystatin (MYCOSTATIN/NYSTOP) powder Apply 1 application topically 3 (three) times daily.     . pantoprazole (PROTONIX) 40 MG tablet TAKE 1 TABLET BY MOUTH EVERY DAY (Patient taking differently: Take 40 mg by mouth daily. ) 90 tablet 1  . PROAIR HFA 108 (90 Base) MCG/ACT inhaler Inhale 2 puffs into the lungs every 4 (four) hours as needed for  shortness of breath or wheezing.  2  . Probiotic Product (PROBIOTIC PO) Take 1 capsule by mouth daily.    . prochlorperazine (COMPAZINE) 10 MG tablet Take 1 tablet (10 mg total) by mouth every 6 (six) hours as needed for nausea or vomiting. 30 tablet 0  . simethicone (MYLICON) 0000000 MG chewable tablet Chew 125 mg by mouth every 6 (six) hours as needed for flatulence.    . simvastatin (ZOCOR) 40 MG tablet Take 1 tablet (40 mg total) by mouth at bedtime. 90 tablet 3  . SYMBICORT 160-4.5 MCG/ACT inhaler Inhale 1 puff into the lungs 2 (two) times daily as needed (for respiratory issues.).   2   No current facility-administered medications for this visit.     Allergies:  Allergies  Allergen Reactions  . Chantix [Varenicline Tartrate] Hives  . Dipyridamole Hives and Rash      Physical Exam:   ECOG: 1    General appearance: [name], awake without any distress. Head: Atraumatic without abnormalities Oropharynx: Without any thrush or ulcers. Eyes: No scleral icterus. Lymph nodes: No lymphadenopathy noted in the cervical, supraclavicular, or axillary nodes Heart:regular rate and rhythm, without any murmurs or gallops.   Lung: Clear to auscultation without any rhonchi, wheezes or dullness to percussion. Abdomin: Soft, nontender without any shifting dullness or ascites. Musculoskeletal: No clubbing or cyanosis. Neurological: No motor or sensory deficits. Skin: No rashes or lesions.     Lab Results: Lab Results  Component Value Date   WBC 9.0 06/03/2019   HGB 12.9 06/03/2019   HCT 41.8 06/03/2019   MCV 86.4 06/03/2019   PLT 164 06/03/2019     Chemistry      Component Value Date/Time   NA 136 06/05/2019 0458   NA 139 03/31/2017 1133   K 4.2 06/05/2019 0458   CL 100 06/05/2019 0458   CO2 27 06/05/2019 0458   BUN 21 06/05/2019 0458   BUN 17 03/31/2017 1133   CREATININE 1.49 (H) 06/05/2019 0458   CREATININE 1.26 (H) 05/17/2019 0901   CREATININE 0.62 09/26/2014 1043       Component Value Date/Time   CALCIUM 9.7 06/05/2019 0458   ALKPHOS 70 06/03/2019 0453   AST 17 06/03/2019 0453   AST 16 05/17/2019 0901   ALT 14 06/03/2019 0453   ALT 10 05/17/2019 0901   BILITOT 0.9 06/03/2019 0453   BILITOT 0.6 05/17/2019 0901     IMPRESSION: 1. No acute pulmonary embolism. 2. Significant interval decrease in size of the previously demonstrated mediastinal and hilar lymph nodes with significant interval decrease in size of the previously demonstrated left lower lobe lung mass. Findings are consistent with a positive response to treatment. 3. Cardiomegaly with mild volume overload. 4. Additional chronic findings as detailed  above.  Aortic Atherosclerosis (ICD10-I70.0).   Impression and Plan:  79 year old woman with:  1.    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    She is currently on Pembrolizumab and axitinib without any major complications.  CT scan obtained on Jun 03, 2019 was personally reviewed and discussed with the patient.  Although it was not intended for staging purposes it did indicate response to therapy.  Complication associated with this therapy were reiterated and she is agreeable to continue at this time.  We will repeat imaging studies in 2 to 3 months.   2.  IV access: Port-A-Cath continues to be in use without any issues.  3.  Antiemetics: Compazine is available without any recent nausea or vomiting.  4.  Goals of care and prognosis: therapy remains palliative although aggressive measures are warranted given her reasonable performance status.  5.  Immune mediated complications: I continue to review potential complications including pneumonitis, colitis and thyroid disease.  She has developed hypothyroidism but no other immune mediated complications.  6.  Hypertension: Her blood pressure is within normal range.  We will continue to monitor on axitinib.  7.   Hypothyroidism: Her Synthroid has been increased currently on 75 mcg.  We will continue to monitor TSH on Pembrolizumab  8.  Follow-up: she will return in 3 weeks for repeat evaluation.  30 minutes were dedicated to this visit.  The time was spent on reviewing her disease status, reviewing imaging studies, risk complications of therapy and future plan of care.     Zola Button, MD 5/28/20219:34 AM

## 2019-06-07 NOTE — Patient Instructions (Signed)
[facility] Discharge Instructions for Patients Receiving Chemotherapy  Today you received the following chemotherapy agents:  Keytruda.  To help prevent nausea and vomiting after your treatment, we encourage you to take your nausea medication as directed.   If you develop nausea and vomiting that is not controlled by your nausea medication, call the clinic.   BELOW ARE SYMPTOMS THAT SHOULD BE REPORTED IMMEDIATELY:  *FEVER GREATER THAN 100.5 F  *CHILLS WITH OR WITHOUT FEVER  NAUSEA AND VOMITING THAT IS NOT CONTROLLED WITH YOUR NAUSEA MEDICATION  *UNUSUAL SHORTNESS OF BREATH  *UNUSUAL BRUISING OR BLEEDING  TENDERNESS IN MOUTH AND THROAT WITH OR WITHOUT PRESENCE OF ULCERS  *URINARY PROBLEMS  *BOWEL PROBLEMS  UNUSUAL RASH Items with * indicate a potential emergency and should be followed up as soon as possible.  Feel free to call the clinic should you have any questions or concerns. The clinic phone number is [phone].  Please show the CHEMO ALERT CARD at check-in to the Emergency Department and triage nurse.    

## 2019-06-11 ENCOUNTER — Telehealth: Payer: Self-pay | Admitting: Oncology

## 2019-06-11 NOTE — Telephone Encounter (Signed)
Scheduled appt per 5/28 los. °

## 2019-06-21 ENCOUNTER — Encounter: Payer: Self-pay | Admitting: Oncology

## 2019-06-21 ENCOUNTER — Telehealth: Payer: Self-pay

## 2019-06-21 NOTE — Telephone Encounter (Signed)
-----   Message from Wyatt Portela, MD sent at 06/21/2019 12:47 PM EDT ----- Madaline Brilliant. Thanks ----- Message ----- From: Tami Lin, RN Sent: 06/21/2019  12:43 PM EDT To: Wyatt Portela, MD  Patient just told me Dr. Ouida Sills prescribed MMW for her yesterday and she will start using it Lanelle Bal ----- Message ----- From: Wyatt Portela, MD Sent: 06/21/2019  12:33 PM EDT To: Tami Lin, RN  This is related to [facility]. Please send her Rx to [facility]. Thanks ----- Message ----- From: Tami Lin, RN Sent: 06/21/2019  12:21 PM EDT To: Wyatt Portela, MD  Patient called and wants to make you aware she has been weak and has experienced mouth sores x 1 week. She said " it feels like peppers inside of my mouth". Denies any other issues. Last treatment of Keytruda was 06/07/19. Next scheduled appointment is 6/18/521. Lanelle Bal

## 2019-06-21 NOTE — Telephone Encounter (Signed)
Patient instructed to call the office with any further questions or concerns.

## 2019-06-28 ENCOUNTER — Encounter: Payer: Self-pay | Admitting: Oncology

## 2019-06-28 ENCOUNTER — Inpatient Hospital Stay: Payer: Medicare Other

## 2019-06-28 ENCOUNTER — Inpatient Hospital Stay: Payer: Medicare Other | Attending: Oncology

## 2019-06-28 ENCOUNTER — Inpatient Hospital Stay (HOSPITAL_BASED_OUTPATIENT_CLINIC_OR_DEPARTMENT_OTHER): Payer: Medicare Other | Admitting: Oncology

## 2019-06-28 ENCOUNTER — Other Ambulatory Visit: Payer: Self-pay

## 2019-06-28 VITALS — BP 150/79 | HR 68 | Temp 97.2°F | Resp 18 | Ht 67.5 in | Wt 202.0 lb

## 2019-06-28 DIAGNOSIS — C649 Malignant neoplasm of unspecified kidney, except renal pelvis: Secondary | ICD-10-CM

## 2019-06-28 DIAGNOSIS — Z79899 Other long term (current) drug therapy: Secondary | ICD-10-CM | POA: Diagnosis not present

## 2019-06-28 DIAGNOSIS — C641 Malignant neoplasm of right kidney, except renal pelvis: Secondary | ICD-10-CM

## 2019-06-28 DIAGNOSIS — E039 Hypothyroidism, unspecified: Secondary | ICD-10-CM | POA: Insufficient documentation

## 2019-06-28 DIAGNOSIS — C7800 Secondary malignant neoplasm of unspecified lung: Secondary | ICD-10-CM | POA: Insufficient documentation

## 2019-06-28 DIAGNOSIS — Z5112 Encounter for antineoplastic immunotherapy: Secondary | ICD-10-CM | POA: Diagnosis present

## 2019-06-28 DIAGNOSIS — Z95828 Presence of other vascular implants and grafts: Secondary | ICD-10-CM

## 2019-06-28 LAB — CBC WITH DIFFERENTIAL (CANCER CENTER ONLY)
Abs Immature Granulocytes: 0.04 10*3/uL (ref 0.00–0.07)
Basophils Absolute: 0.1 10*3/uL (ref 0.0–0.1)
Basophils Relative: 1 %
Eosinophils Absolute: 0.2 10*3/uL (ref 0.0–0.5)
Eosinophils Relative: 2 %
HCT: 42.7 % (ref 36.0–46.0)
Hemoglobin: 13.7 g/dL (ref 12.0–15.0)
Immature Granulocytes: 0 %
Lymphocytes Relative: 25 %
Lymphs Abs: 2.3 10*3/uL (ref 0.7–4.0)
MCH: 26.9 pg (ref 26.0–34.0)
MCHC: 32.1 g/dL (ref 30.0–36.0)
MCV: 83.9 fL (ref 80.0–100.0)
Monocytes Absolute: 1 10*3/uL (ref 0.1–1.0)
Monocytes Relative: 11 %
Neutro Abs: 5.8 10*3/uL (ref 1.7–7.7)
Neutrophils Relative %: 61 %
Platelet Count: 138 10*3/uL — ABNORMAL LOW (ref 150–400)
RBC: 5.09 MIL/uL (ref 3.87–5.11)
RDW: 16.2 % — ABNORMAL HIGH (ref 11.5–15.5)
WBC Count: 9.4 10*3/uL (ref 4.0–10.5)
nRBC: 0 % (ref 0.0–0.2)

## 2019-06-28 LAB — CMP (CANCER CENTER ONLY)
ALT: 10 U/L (ref 0–44)
AST: 17 U/L (ref 15–41)
Albumin: 3.3 g/dL — ABNORMAL LOW (ref 3.5–5.0)
Alkaline Phosphatase: 78 U/L (ref 38–126)
Anion gap: 12 (ref 5–15)
BUN: 16 mg/dL (ref 8–23)
CO2: 21 mmol/L — ABNORMAL LOW (ref 22–32)
Calcium: 9.5 mg/dL (ref 8.9–10.3)
Chloride: 104 mmol/L (ref 98–111)
Creatinine: 1.56 mg/dL — ABNORMAL HIGH (ref 0.44–1.00)
GFR, Est AFR Am: 37 mL/min — ABNORMAL LOW (ref >60)
GFR, Estimated: 32 mL/min — ABNORMAL LOW (ref >60)
Glucose, Bld: 90 mg/dL (ref 70–99)
Potassium: 3.7 mmol/L (ref 3.5–5.1)
Sodium: 137 mmol/L (ref 135–145)
Total Bilirubin: 1.7 mg/dL — ABNORMAL HIGH (ref 0.3–1.2)
Total Protein: 6.6 g/dL (ref 6.5–8.1)

## 2019-06-28 LAB — TSH: TSH: 43.524 u[IU]/mL — ABNORMAL HIGH (ref 0.308–3.960)

## 2019-06-28 MED ORDER — SODIUM CHLORIDE 0.9% FLUSH
10.0000 mL | INTRAVENOUS | Status: DC | PRN
  Administered 2019-06-28: 10 mL via INTRAVENOUS
  Filled 2019-06-28: qty 10

## 2019-06-28 MED ORDER — SODIUM CHLORIDE 0.9% FLUSH
10.0000 mL | INTRAVENOUS | Status: DC | PRN
  Administered 2019-06-28: 10 mL
  Filled 2019-06-28: qty 10

## 2019-06-28 MED ORDER — HEPARIN SOD (PORK) LOCK FLUSH 100 UNIT/ML IV SOLN
500.0000 [IU] | Freq: Once | INTRAVENOUS | Status: AC | PRN
  Administered 2019-06-28: 500 [IU]
  Filled 2019-06-28: qty 5

## 2019-06-28 MED ORDER — PROCHLORPERAZINE MALEATE 10 MG PO TABS: 10.0000 mg | ORAL_TABLET | Freq: Four times a day (QID) | ORAL | 3 refills | Status: DC | PRN

## 2019-06-28 MED ORDER — SODIUM CHLORIDE 0.9 % IV SOLN
200.0000 mg | Freq: Once | INTRAVENOUS | Status: AC
  Administered 2019-06-28: 200 mg via INTRAVENOUS
  Filled 2019-06-28: qty 8

## 2019-06-28 MED ORDER — SODIUM CHLORIDE 0.9 % IV SOLN
Freq: Once | INTRAVENOUS | Status: AC
  Filled 2019-06-28: qty 250

## 2019-06-28 NOTE — Progress Notes (Signed)
Per Dr. Alen Blew, [place] to treat with serum creatinine 1.56 and bilirubin 1.7.

## 2019-06-28 NOTE — Progress Notes (Signed)
Hematology and Oncology Follow Up Visit  AI SONNENFELD [id number] 05-04-1940 79 y.o. 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   Prior Therapy: She underwent right laparoscopic radical nephrectomy completed by Dr. Alinda Money on December 28, 2017.  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  The final pathological stage was T1b.     Current therapy: Pembrolizumab 200 mg every 3 weeks started on April 25, 2019 with axitinib 5 mg daily.  She is here for cycle 4 of therapy.  Interim History: Ms. [name] returns today for a repeat evaluation.  Since the last visit, she reports a few complaints related to her treatment including mostly mouth pain and fatigue.  She has reported hoarseness and some instability at times without any falls or syncope.  She denies any nausea, vomiting or weight loss.  She denies any appetite changes.  Her blood pressure has been mildly elevated.      Medications: Updated without any changes. Current Outpatient Medications  Medication Sig Dispense Refill  . acetaminophen (TYLENOL) 500 MG tablet Take 1,000 mg by mouth every 6 (six) hours as needed for moderate pain or headache.     . ALPRAZolam (XANAX) 0.25 MG tablet Take 0.25 mg by mouth 3 (three) times daily as needed for anxiety.  0  . amLODipine (NORVASC) 10 MG tablet Take 1 tablet (10 mg total) by mouth daily. 90 tablet 1  . amoxicillin (AMOXIL) 500 MG tablet Take 4 tablets (2,000 mg total) by mouth as directed. 1 hour prior to dental work including cleanings 12 tablet 12  . aspirin EC 81 MG tablet Take 81 mg by mouth at bedtime.     [name] axitinib (INLYTA) 5 MG tablet Take 1 tablet (5 mg total) by mouth 2 (two) times daily. 60 tablet 0  . calcium elemental as carbonate (BARIATRIC TUMS ULTRA) 400 MG  chewable tablet Chew 1,000 mg by mouth 3 (three) times daily as needed for heartburn.    . clobetasol ointment (TEMOVATE) 5.02 % Apply 1 application topically 2 (two) times daily as needed (Rash).     . diclofenac Sodium (VOLTAREN) 1 % GEL Apply 4 g topically 3 (three) times daily as needed (Right knee pain).     [name] FLUoxetine (PROZAC) 40 MG capsule Take 40 mg by mouth every evening.    . furosemide (LASIX) 20 MG tablet Take 20 mg by mouth daily as needed for edema.    . irbesartan (AVAPRO) 300 MG tablet Take 1 tablet (300 mg total) by mouth daily. 90 tablet 2  . levothyroxine (SYNTHROID) 75 MCG tablet Take 1 tablet (75 mcg total) by mouth daily at 6 (six) AM. 30 tablet 0  . loperamide (IMODIUM) 2 MG capsule Take 2-4 mg by mouth 4 (four) times daily as needed for diarrhea or loose stools.     . Nebivolol HCl (BYSTOLIC) 20 MG TABS Take 1 tablet (20 mg total) by mouth daily. 30 tablet 11  . nitroGLYCERIN (NITROSTAT) 0.4 MG SL tablet Place 0.4 mg under the tongue every 5 (five) minutes as needed for chest pain (MAX 3 TABLETS).    [name] nystatin (MYCOSTATIN/NYSTOP) powder Apply 1 application topically 3 (three) times daily.     . pantoprazole (PROTONIX) 40 MG tablet TAKE 1 TABLET BY MOUTH EVERY DAY (Patient taking differently: Take 40 mg  by mouth daily. ) 90 tablet 1  . PROAIR HFA 108 (90 Base) MCG/ACT inhaler Inhale 2 puffs into the lungs every 4 (four) hours as needed for shortness of breath or wheezing.  2  . Probiotic Product (PROBIOTIC PO) Take 1 capsule by mouth daily.    . prochlorperazine (COMPAZINE) 10 MG tablet Take 1 tablet (10 mg total) by mouth every 6 (six) hours as needed for nausea or vomiting. 30 tablet 0  . simethicone (MYLICON) 194 MG chewable tablet Chew 125 mg by mouth every 6 (six) hours as needed for flatulence.    . simvastatin (ZOCOR) 40 MG tablet Take 1 tablet (40 mg total) by mouth at bedtime. 90 tablet 3  . SYMBICORT 160-4.5 MCG/ACT inhaler Inhale 1 puff into the lungs 2 (two)  times daily as needed (for respiratory issues.).   2   No current facility-administered medications for this visit.   Facility-Administered Medications Ordered in Other Visits  Medication Dose Route Frequency Provider Last Rate Last Admin  . sodium chloride flush (NS) 0.9 % injection 10 mL  10 mL Intravenous PRN Wyatt Portela, MD   10 mL at 06/28/19 0841     Allergies:  Allergies  Allergen Reactions  . Chantix [Varenicline Tartrate] Hives  . Dipyridamole Hives and Rash      Physical Exam: Blood pressure (!) 150/79, pulse 68, temperature (!) 97.2 F (36.2 C), temperature source Temporal, resp. rate 18, height 5' 7.5" (1.715 m), weight 202 lb (91.6 kg), SpO2 98 %.    ECOG: 1   General appearance: Comfortable appearing without any discomfort Head: Normocephalic without any trauma Oropharynx: Mucous membranes are moist and pink without any thrush or ulcers. Eyes: Pupils are equal and round reactive to light. Lymph nodes: No cervical, supraclavicular, inguinal or axillary lymphadenopathy.   Heart:regular rate and rhythm.  S1 and S2 without leg edema. Lung: Clear without any rhonchi or wheezes.  No dullness to percussion. Abdomin: Soft, nontender, nondistended with good bowel sounds.  No hepatosplenomegaly. Musculoskeletal: No joint deformity or effusion.  Full range of motion noted. Neurological: No deficits noted on motor, sensory and deep tendon reflex exam. Skin: No petechial rash or dryness.  Appeared moist.       Lab Results: Lab Results  Component Value Date   WBC 7.9 06/07/2019   HGB 11.6 (L) 06/07/2019   HCT 37.2 06/07/2019   MCV 85.1 06/07/2019   PLT 154 06/07/2019     Chemistry      Component Value Date/Time   NA 136 06/07/2019 0909   NA 139 03/31/2017 1133   K 4.2 06/07/2019 0909   CL 103 06/07/2019 0909   CO2 22 06/07/2019 0909   BUN 19 06/07/2019 0909   BUN 17 03/31/2017 1133   CREATININE 1.28 (H) 06/07/2019 0909   CREATININE 0.62 09/26/2014  1043      Component Value Date/Time   CALCIUM 9.6 06/07/2019 0909   ALKPHOS 71 06/07/2019 0909   AST 16 06/07/2019 0909   ALT 9 06/07/2019 0909   BILITOT 0.7 06/07/2019 0909        Impression and Plan:  79 year old woman with:  1.    Stage IV clear-cell renal cell carcinoma with pulmonary involvement diagnosed in March 2021.    She continues to tolerate pembrolizumab and axitinib without any recent complaints.  Risks and benefits of continuing this therapy long-term were reviewed.  She has already established a radiographic benefit based on imaging studies obtained in May 2021.  Alternative  treatment options were also reviewed including switching to single agent therapy.  After discussion today, he is agreeable to continue and will reduce the axitinib dose at this time.  She will receive it at 5 mg daily given the side effects that she is experiencing.  I anticipate improvement in these side effects for the time being.   2.  IV access: Port-A-Cath remains in use without any complications.  3.  Antiemetics: No nausea or vomiting reported at this time.  Antiemetics are available to her.  4.  Goals of care and prognosis: Her disease is incurable although aggressive measures are warranted given her reasonable performance status and excellent radiographic response.  5.  Immune mediated complications: These complications include pneumonitis, colitis, thyroid disease among others were reviewed.  We will continue to monitor at this time.   6.  Hypertension: Mildly elevated today and I anticipate improvement after decreasing axitinib dose.  7.  Hypothyroidism: TSH was elevated on May 28 and her Synthroid has been increased.  We will continue to monitor.  8.  Follow-up: She will return in 3 weeks for the next cycle of therapy.    30 minutes were spent on this encounter.  The time was dedicated to updating her disease status, addressing complications noted therapy as well as reviewing  alternative treatment options.     Zola Button, MD 6/18/20218:47 AM

## 2019-06-28 NOTE — Patient Instructions (Signed)
[facility] Discharge Instructions for Patients Receiving Chemotherapy  Today you received the following chemotherapy agents:  Keytruda.  To help prevent nausea and vomiting after your treatment, we encourage you to take your nausea medication as directed.   If you develop nausea and vomiting that is not controlled by your nausea medication, call the clinic.   BELOW ARE SYMPTOMS THAT SHOULD BE REPORTED IMMEDIATELY:  *FEVER GREATER THAN 100.5 F  *CHILLS WITH OR WITHOUT FEVER  NAUSEA AND VOMITING THAT IS NOT CONTROLLED WITH YOUR NAUSEA MEDICATION  *UNUSUAL SHORTNESS OF BREATH  *UNUSUAL BRUISING OR BLEEDING  TENDERNESS IN MOUTH AND THROAT WITH OR WITHOUT PRESENCE OF ULCERS  *URINARY PROBLEMS  *BOWEL PROBLEMS  UNUSUAL RASH Items with * indicate a potential emergency and should be followed up as soon as possible.  Feel free to call the clinic should you have any questions or concerns. The clinic phone number is [phone].  Please show the CHEMO ALERT CARD at check-in to the Emergency Department and triage nurse.    

## 2019-07-01 ENCOUNTER — Telehealth: Payer: Self-pay | Admitting: Cardiology

## 2019-07-01 ENCOUNTER — Other Ambulatory Visit: Payer: Self-pay | Admitting: Oncology

## 2019-07-01 NOTE — Telephone Encounter (Signed)
Advised patient there are no sooner appts with MD. Scheduled for sooner OV with Denyse Amass NP on 6/23 @ 915am

## 2019-07-01 NOTE — Telephone Encounter (Signed)
Pt called and wanted to know if there was any chance she could get in sooner. Says that 07/23/19 is too long for her. Please call to discuss

## 2019-07-02 ENCOUNTER — Telehealth: Payer: Self-pay | Admitting: Oncology

## 2019-07-02 ENCOUNTER — Telehealth: Payer: Self-pay | Admitting: *Deleted

## 2019-07-02 DIAGNOSIS — C641 Malignant neoplasm of right kidney, except renal pelvis: Secondary | ICD-10-CM

## 2019-07-02 MED ORDER — MAGIC MOUTHWASH W/LIDOCAINE: 5.0000 mL | Freq: Four times a day (QID) | ORAL | 0 refills | Status: DC | PRN

## 2019-07-02 NOTE — Progress Notes (Signed)
Cardiology Clinic Note   Patient Name: [name] Date of Encounter: 07/03/2019  Primary Care Provider:  [name], MD Primary Cardiologist:  Peter Martinique, MD  Patient Profile    [name] 79 year old female presents today for post hospital follow-up of respiratory failure with hypoxemia.  Past Medical History    Past Medical History:  Diagnosis Date  . Abdominal aortic aneurysm ([place])    infrarenal diagnosed with maximum measurement at 3.3 cm  . Cancer ([place])   . Coronary artery disease   . Depression   . Diverticulosis   . GERD (gastroesophageal reflux disease)   . History of tobacco abuse    Recurrent cigarette smoking  . Hyperlipidemia   . Hyperplastic colon polyp   . Hypertension   . Hypothyroidism   . Ischemic heart disease    had redo bypass surgery in 2001, at that time she had a free right internal mammary to the LAD, LAD endarterectomy, sequential vein graft to the OM1 and OM2 -- last catheterization was in 2008  . Leg fracture    remote right leg fracture  . Microscopic colitis   . Obesity    with gastric bypass surgery in 2004  . PAD (peripheral artery disease) ([facility])    right iliac occlusion  . PONV (postoperative nausea and vomiting)   . Right kidney mass   . S/P CABG x 4 1985   LIMA to LAD, Sequential SVG to OM1-OM2, SVG to RCA - Dr Redmond Pulling  . S/P redo CABG x 3 2001   Free RIMA to LAD with endarterectomy, sequential SVG to OM1-OM2 - Dr Servando Snare  . 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 ARTERIAL LINE INSERTION Right 05/09/2017   Procedure: ARTERIAL LINE  INSERTION  RIGHT RADIAL ARTERY;  Surgeon: Sherren Mocha, MD;  Location: [place];  Service: Open Heart Surgery;  Laterality: Right;  . CARDIAC CATHETERIZATION  02/16/1999   Left heart catheterization with  selective coronary  angiography, left ventricular angiography (RAO and LAO views), angiography of left internal mammary artery, and angiography of saphenous vein grafts  . CARDIAC CATHETERIZATION  10/20/2006   Est. EF of 55% -- Totally occuladed native coronary circulation -- Persistent patency of the right mammary graft and the distal LAD artery with peristent patency of the left mammary graft to the proximal LAD artery with persistant patency of the saphenous vein graft to the sequentail branches of the OM        . CARPAL TUNNEL RELEASE    . CHOLECYSTECTOMY    . COLONOSCOPY  07/12/2007   Dr.Brodie- normal cecum and rectum, diverticulosis in the sigmoid colon  . COLONOSCOPY  1991   per Dr.Brodie office visit note= hyperplastic polyp  . COLONOSCOPY N/A 09/30/2014   Dr.Rourk- noraml appearing rectal mucosa, scattered L sided diverticula, colonic mucosa has a somewhat pale friable appearence diffusely, no ulcers or erosions seen, the distal 10cm of terminal ileum appeared entirely normal. bx=benign colorectal mucosa with lymphocytic colitis  . CORONARY ARTERY BYPASS GRAFT  1985  . CORONARY ARTERY BYPASS GRAFT  03/09/1999   Redo CABG x3 --  with the free right internal mammary artey to the LAD with an LAD endarterectomy -- Sequential saphenous vein graft to OM1 and OM2                   .  CYSTOSCOPY/RETROGRADE/URETEROSCOPY Right 12/21/2017   Procedure: GDJMEQASTM/[id number];  Surgeon: Raynelle Bring, MD;  Location: [facility];  Service: Urology;  Laterality: Right;  . ESOPHAGOGASTRODUODENOSCOPY N/A 09/30/2014   Dr.Rourk-mild erosive reflux esophagitis s/p bariatric surgery  . EYE SURGERY     catracts  . GASTRIC BYPASS  2004  . INCISION AND DRAINAGE ABSCESS N/A 01/11/2018   Procedure: INCISION AND DRAINAGE ABSCESS;  Surgeon: Raynelle Bring, MD;  Location: [facility];  Service: Urology;  Laterality: N/A;  . IR IMAGING GUIDED PORT INSERTION  05/09/2019  . LAPAROSCOPIC NEPHRECTOMY Right 12/28/2017   Procedure:  LAPAROSCOPIC RADICAL NEPHRECTOMY;  Surgeon: Raynelle Bring, MD;  Location: [facility];  Service: Urology;  Laterality: Right;  . RIGHT/LEFT HEART CATH AND CORONARY/GRAFT ANGIOGRAPHY N/A 04/04/2017   Procedure: RIGHT/LEFT HEART CATH AND CORONARY/GRAFT ANGIOGRAPHY;  Surgeon: Martinique, Peter M, MD;  Location: [facility];  Service: Cardiovascular;  Laterality: N/A;  . TEE WITHOUT CARDIOVERSION N/A 05/09/2017   Procedure: TRANSESOPHAGEAL ECHOCARDIOGRAM (TEE);  Surgeon: Sherren Mocha, MD;  Location: [facility];  Service: Open Heart Surgery;  Laterality: N/A;  . TRANSCATHETER AORTIC VALVE REPLACEMENT, TRANSFEMORAL N/A 05/09/2017   Procedure: TRANSCATHETER AORTIC VALVE REPLACEMENT, TRANSFEMORAL using a 95mm Edwards Sapien 3 Aortic Valve;  Surgeon: Sherren Mocha, MD;  Location: [facility];  Service: Open Heart Surgery;  Laterality: N/A;    Allergies  Allergies  Allergen Reactions  . Chantix [Varenicline Tartrate] Hives  . Dipyridamole Hives and Rash    History of Present Illness    [name] has a PMH of coronary artery disease status post CABG with redo CABG in 2001, HTN, chronic diastolic CHF, aortic stenosis status post TAVR (05/09/2017), AAA with last ultrasound showing a 3.1 cm aneurysm 8/18, renal cell carcinoma status post nephrectomy (12/28/2017), PVD, she was recently diagnosed with metastatic disease.  She also has chronic claudication symptoms with pain in right thigh with walking long distances.  She has a known right iliac occlusion.  Her last ABIs 8/18 showed stable right ABI of 0.75.  Her nuclear stress test 1/16 showed anterior wall ischemia.  These results were unchanged from 2012 and 2013.  Her cardiac catheterization in 2008 showed patent stents.  In March she had a CT chest that showed significant adenopathy in her chest.  Dr. Alen Blew felt this was metastatic renal cancer.  At that time she was getting ready to start cancer treatment.  She was noted to have shortness of breath, no chest pain,  occasional swelling in the left ankle and was using Lasix as needed.  She denied claudication.  She still continues to smoke.  Her husband passed away February 9211 due to complications of HERDE-08.  Admitted to the hospital 06/02/19-06/05/19 with acute encephalopathy, acute respiratory failure with hypoxemia and urinary tract infection.  She received IV antibiotics and IV hydration as well as supplemental oxygen.  On presentation her oxygen saturation was in the 80s and was believed to be related to acute on chronic diastolic CHF.  With IV diuresis in the emergency room her oxygenation improved.  Her acute encephalopathy was believed to be related to UTI.  She was noted to have mild AKI on top of her CKD stage III which improved with IV hydration.  She was discharged in stable condition and prescribed Levaquin.  She contacted nurse triage line on 07/01/2019 and asked for a sooner appointment because she felt that her 07/23/2019 appointment was not soon enough.  She presents the clinic today for follow-up evaluation and states she has noticed  that her energy level is somewhat lower than it was before she went to the hospital.  She states that she is doing physical therapy 2 days/week.  She is trying to eat a low-sodium diet however, things do not taste good with her chemotherapy/cancer treatment and she states she has been eating soft foods because of pain in her mouth.  She is very pleasant today and states that she went to have a sooner follow-up with cardiology due to concerns for her aortic valve.  She states she has follow-up echocardiogram next month for further evaluation.  I will not make any medication changes at this time, increase physical activity as tolerated, continue low-sodium heart healthy diet, and follow-up with Dr. Martinique in 4 months.  Today she denies chest pain, shortness of breath, lower extremity edema, fatigue, palpitations, melena, hematuria, hemoptysis, diaphoresis, weakness, presyncope,  syncope, orthopnea, and PND.  Home Medications    Prior to Admission medications   Medication Sig Start Date End Date Taking? Authorizing Provider  acetaminophen (TYLENOL) 500 MG tablet Take 1,000 mg by mouth every 6 (six) hours as needed for moderate pain or headache.     [provider]  ALPRAZolam Duanne Moron) 0.25 MG tablet Take 0.25 mg by mouth 3 (three) times daily as needed for anxiety. 03/20/17   [provider]  amLODipine (NORVASC) 10 MG tablet Take 1 tablet (10 mg total) by mouth daily. 03/22/19   Martinique, Peter M, MD  amoxicillin (AMOXIL) 500 MG tablet Take 4 tablets (2,000 mg total) by mouth as directed. 1 hour prior to dental work including cleanings 04/27/18   Eileen Stanford, PA-C  aspirin EC 81 MG tablet Take 81 mg by mouth at bedtime.     [provider]  calcium elemental as carbonate (BARIATRIC TUMS ULTRA) 400 MG chewable tablet Chew 1,000 mg by mouth 3 (three) times daily as needed for heartburn.    [provider]  clobetasol ointment (TEMOVATE) 5.78 % Apply 1 application topically 2 (two) times daily as needed (Rash).  04/03/19 04/02/20  [provider]  diclofenac Sodium (VOLTAREN) 1 % GEL Apply 4 g topically 3 (three) times daily as needed (Right knee pain).     [provider]  FLUoxetine (PROZAC) 40 MG capsule Take 40 mg by mouth every evening.    [provider]  furosemide (LASIX) 20 MG tablet Take 20 mg by mouth daily as needed for edema.    [provider]  INLYTA 5 MG tablet TAKE 1 TABLET (5 MG TOTAL) BY MOUTH 2 (TWO) TIMES DAILY. 07/01/19   Wyatt Portela, MD  irbesartan (AVAPRO) 300 MG tablet Take 1 tablet (300 mg total) by mouth daily. 12/24/18   Martinique, Peter M, MD  levothyroxine (SYNTHROID) 75 MCG tablet Take 1 tablet (75 mcg total) by mouth daily at 6 (six) AM. 06/06/19 07/06/19  Darliss Cheney, MD  loperamide (IMODIUM) 2 MG capsule Take 2-4 mg by mouth 4 (four) times daily as needed for diarrhea or  loose stools.     [provider]  magic mouthwash SOLN Take 5 mLs by mouth.    [provider]  Nebivolol HCl (BYSTOLIC) 20 MG TABS Take 1 tablet (20 mg total) by mouth daily. 02/22/19   Martinique, Peter M, MD  nitroGLYCERIN (NITROSTAT) 0.4 MG SL tablet Place 0.4 mg under the tongue every 5 (five) minutes as needed for chest pain (MAX 3 TABLETS).    [provider]  nystatin (MYCOSTATIN/NYSTOP) powder Apply 1 application  topically 3 (three) times daily.  12/12/18   [provider]  pantoprazole (PROTONIX) 40 MG tablet TAKE 1 TABLET BY MOUTH EVERY DAY Patient taking differently: Take 40 mg by mouth daily.  02/20/19   Annitta Needs, NP  PROAIR HFA 108 [phone] Base) MCG/ACT inhaler Inhale 2 puffs into the lungs every 4 (four) hours as needed for shortness of breath or wheezing. 03/14/17   [provider]  Probiotic Product (PROBIOTIC PO) Take 1 capsule by mouth daily.    [provider]  prochlorperazine (COMPAZINE) 10 MG tablet Take 1 tablet (10 mg total) by mouth every 6 (six) hours as needed for nausea or vomiting. 06/28/19   Wyatt Portela, MD  simethicone (MYLICON) 673 MG chewable tablet Chew 125 mg by mouth every 6 (six) hours as needed for flatulence.    [provider]  simvastatin (ZOCOR) 40 MG tablet Take 1 tablet (40 mg total) by mouth at bedtime. 03/18/19   Martinique, Peter M, MD  SYMBICORT 160-4.5 MCG/ACT inhaler Inhale 1 puff into the lungs 2 (two) times daily as needed (for respiratory issues.).  03/14/17   [provider]    Family History    Family History  Problem Relation Age of Onset  . Stroke Father   . Diabetes Mother   . Pancreatic cancer Mother    She indicated that her mother is deceased. She indicated that her father is deceased. She indicated that her maternal grandmother is deceased. She indicated that her maternal grandfather is deceased. She indicated that her paternal grandmother is deceased. She indicated that  her paternal grandfather is deceased.  Social History    Social History   Socioeconomic History  . Marital status: Married    Spouse name: Not on file  . Number of children: Not on file  . Years of education: Not on file  . Highest education level: Not on file  Occupational History  . Occupation: reitred    Fish farm manager: [employer]  Tobacco Use  . Smoking status: Current Some Day Smoker    Packs/day: 0.50    Types: Cigarettes    Start date: 01/10/1985    Last attempt to quit: 02/11/2016    Years since quitting: 3.3  . Smokeless tobacco: Never Used  . Tobacco comment: smokes 1-2 cigarettes a day  Vaping Use  . Vaping Use: Never used  Substance and Sexual Activity  . Alcohol use: Yes    Alcohol/week: 0.0 standard drinks    Comment: rarely wine  . Drug use: No  . Sexual activity: Never  Other Topics Concern  . Not on file  Social History Narrative  . Not on file   Social Determinants of Health   Financial Resource Strain:   . Difficulty of Paying Living Expenses:   Food Insecurity:   . Worried About Charity fundraiser in the Last Year:   . Arboriculturist in the Last Year:   Transportation Needs:   . Film/video editor (Medical):   [name] Lack of Transportation (Non-Medical):   Physical Activity:   . Days of Exercise per Week:   . Minutes of Exercise per Session:   Stress:   . Feeling of Stress :   Social Connections:   . Frequency of Communication with Friends and Family:   . Frequency of Social Gatherings with Friends and Family:   . Attends Religious Services:   . Active Member of Clubs or Organizations:   . Attends Club  or Organization Meetings:   [name] Marital Status:   Intimate Partner Violence:   . Fear of Current or Ex-Partner:   . Emotionally Abused:   [name] Physically Abused:   . Sexually Abused:      Review of Systems    General:  No chills, fever, night sweats or weight changes.  Cardiovascular:  No chest pain, dyspnea on exertion, edema,  orthopnea, palpitations, paroxysmal nocturnal dyspnea. Dermatological: No rash, lesions/masses Respiratory: No cough, dyspnea Urologic: No hematuria, dysuria Abdominal:   No nausea, vomiting, diarrhea, bright red blood per rectum, melena, or hematemesis Neurologic:  No visual changes, wkns, changes in mental status. All other systems reviewed and are otherwise negative except as noted above.  Physical Exam    VS:  BP 116/78   Pulse 62   Temp (!) 97.1 F (36.2 C)   Ht 5\' 7"  (1.702 m)   Wt 202 lb 6.4 oz (91.8 kg)   SpO2 96%   BMI 31.70 kg/m  , BMI Body mass index is 31.7 kg/m. GEN: Well nourished, well developed, in no acute distress. HEENT: normal. Neck: Supple, no JVD, carotid bruits, or masses. Cardiac: RRR, no murmurs, rubs, or gallops. No clubbing, cyanosis, edema.  Radials/DP/PT 2+ and equal bilaterally.  Respiratory:  Respirations regular and unlabored, clear to auscultation bilaterally. GI: Soft, nontender, nondistended, BS + x 4. MS: no deformity or atrophy. Skin: warm and dry, no rash. Neuro:  Strength and sensation are intact. Psych: Normal affect.  Accessory Clinical Findings    ECG personally reviewed by me today-none today.  EKG 06/02/2019 Sinus rhythm left anterior fascicular block probable anterior septal infarct undetermined age 12 bpm  Cardiac cath 04/04/17:  RIGHT/LEFT HEART CATH AND CORONARY/GRAFT ANGIOGRAPHY  Conclusion     Ost LAD to Prox LAD lesion is 100% stenosed.  Prox LAD to Mid LAD lesion is 100% stenosed.  Ost Cx to Mid Cx lesion is 100% stenosed.  Prox RCA to Mid RCA lesion is 100% stenosed.  LIMA graft was visualized by angiography and is normal in caliber.  The graft exhibits no disease.  Seq SVG- OM 1 and OM 2 graft was visualized by angiography and is large.  SVG graft was visualized by angiography and is normal in caliber.  The graft exhibits no disease.  Dist Graft lesion is 30% stenosed.  Origin lesion is 35%  stenosed.  There is severe aortic valve stenosis.  LV end diastolic pressure is mildly elevated.  Hemodynamic findings consistent with mild pulmonary hypertension.  1. Severe 3 vessel occlusive CAD 2. Patent LIMA to the mid LAD. This only supplies a septal perforator.  3. Patent SVG to mid to distal LAD 4. Patent sequential SVG to OM 1 and OM 2 5. Severe Aortic stenosis. Mean gradient 32 mm Hg, peak gradient 35 mm Hg. Valve area 1.1 cm squared. Index 0.56 6. Normal cardiac output 7. Mild pulmonary HTN with mildly elevated LV filling pressures.   Plan: Coronary circulation is unchanged from 2008. Patient has progressive symptomatic aortic stenosis. Will refer for TAVR evaluation.     Echocardiogram 07/30/2018 IMPRESSIONS    1. The left ventricle has normal systolic function with an ejection  fraction of 60-65%. The cavity size was normal. There is mildly increased  left ventricular wall thickness. Left ventricular diastolic Doppler  parameters are consistent with  pseudonormalization. Elevated left ventricular end-diastolic pressure The  E/e' is 38.  2. The right ventricle has normal systolic function. The cavity was  normal. There is  no increase in right ventricular wall thickness. Right  ventricular systolic pressure could not be assessed.  3. Left atrial size was moderately dilated.  4. A 26 Edwards Sapien bioprosthetic aortic valve (TAVR) valve is present  in the aortic position. Procedure Date: 05/09/2017 Echo findings shows no  evidence of rocking, dehiscence, regurgitation or perivalvular leak of the  aortic prosthesis.  5. Mean aortic valve systolic gradient 11 mmHg. Using an LVOT diameter of  2.6 cm and LVOT TVI of 23 cm, the aortic valve area calculates to 2.3 cm2.   Assessment & Plan   1.  Chronic diastolic CHF-no increased activity intolerance or shortness of breath today.  Euvolemic.  Admitted 06/02/2019-06/05/2019 acute encephalopathy, acute respiratory  failure with hypoxemia, acute on chronic diastolic CHF and urinary tract infection.  Received IV diuresis, antibiotics, and supplemental oxygen.  Echocardiogram 7/20 showed normal LVEF, pseudonormalization, and well-functioning TAVR. Continue furosemide, aspirin, amlodipine, Avapro, and Bystolic Heart healthy low-sodium diet-salty 6 given Increase physical activity as tolerated Daily weights Follow-up echocardiogram scheduled for 7/21  Coronary artery disease-no chest pain today.  Redo CABG 2001 with SVG to first and second obtuse marginal and RIMA to LAD.  Cardiac catheterization 3/19 showed patent grafts. Continue aspirin, Bystolic, simvastatin, amlodipine Heart healthy low-sodium diet-salty 6 given Increase physical activity as tolerated  Severe aortic stenosis-status post TAVR 4/19.  Echocardiogram 7/20 showed normal LVEF, pseudonormalization, and well-functioning TAVR.  Essential hypertension-BP today 116/78.  Well-controlled at home Continue Avapro, Bystolic, amlodipine Heart healthy low-sodium diet-salty 6 given Increase physical activity as tolerated  Peripheral arterial disease-chronic right iliac occlusion.  Continues to have symptoms of claudication with increased physical activity.  Unchanged ABIs 7/20. Continue to monitor   Disposition: Follow-up with Dr. Martinique in 4 months.  Jossie Ng. Chipper Koudelka NP-C    07/03/2019, 9:31 AM [address] Office [phone] Fax [phone]

## 2019-07-02 NOTE — Telephone Encounter (Signed)
Scheduled per 06/21 los, patient has been called and voicemail was left.

## 2019-07-02 NOTE — Telephone Encounter (Signed)
Dr. Alen Blew gave verbal order for prescription - Magic mouthwash using lidocaine. New Rx: 1 part Maalox, 1 part Nystatin, 1 part Lidocaine. Prescription called to [facility] @ [facility] [address].

## 2019-07-03 ENCOUNTER — Ambulatory Visit (INDEPENDENT_AMBULATORY_CARE_PROVIDER_SITE_OTHER): Payer: Medicare Other | Admitting: General Practice

## 2019-07-03 ENCOUNTER — Encounter: Payer: Self-pay | Admitting: General Practice

## 2019-07-03 ENCOUNTER — Other Ambulatory Visit: Payer: Self-pay

## 2019-07-03 VITALS — BP 116/78 | HR 62 | Temp 97.1°F | Ht 67.0 in | Wt 202.4 lb

## 2019-07-03 DIAGNOSIS — I25810 Atherosclerosis of coronary artery bypass graft(s) without angina pectoris: Secondary | ICD-10-CM | POA: Diagnosis not present

## 2019-07-03 DIAGNOSIS — I5032 Chronic diastolic (congestive) heart failure: Secondary | ICD-10-CM | POA: Diagnosis not present

## 2019-07-03 DIAGNOSIS — I350 Nonrheumatic aortic (valve) stenosis: Secondary | ICD-10-CM

## 2019-07-03 DIAGNOSIS — I10 Essential (primary) hypertension: Secondary | ICD-10-CM

## 2019-07-03 DIAGNOSIS — I739 Peripheral vascular disease, unspecified: Secondary | ICD-10-CM

## 2019-07-03 NOTE — Patient Instructions (Signed)
Medication Instructions:  The current medical regimen is effective;  continue present plan and medications as directed. Please refer to the Current Medication list given to you today. *If you need a refill on your cardiac medications before your next appointment, please call your pharmacy*  Special Instructions INCREASE PHYSICAL ACTIVITY AS TOLERATED  PLEASE READ AND FOLLOW SALTY 6-ATTACHED  Follow-Up: Your next appointment:  October 2021  In Person with Peter Martinique, MD  At [facility], you and your health needs are our priority.  As part of our continuing mission to provide you with exceptional heart care, we have created designated Provider Care Teams.  These Care Teams include your primary Cardiologist (physician) and Advanced Practice Providers (APPs -  Physician Assistants and Nurse Practitioners) who all work together to provide you with the care you need, when you need it.

## 2019-07-05 ENCOUNTER — Telehealth: Payer: Self-pay

## 2019-07-05 ENCOUNTER — Other Ambulatory Visit: Payer: Self-pay

## 2019-07-05 ENCOUNTER — Other Ambulatory Visit: Payer: Self-pay | Admitting: Oncology

## 2019-07-05 MED ORDER — LEVOTHYROXINE SODIUM 75 MCG PO TABS: 75.0000 ug | ORAL_TABLET | Freq: Every day | ORAL | 0 refills | Status: DC

## 2019-07-05 NOTE — Telephone Encounter (Signed)
-----   Message from Wyatt Portela, MD sent at 07/05/2019  3:50 PM EDT ----- Yes. Thanks ----- Message ----- From: Tami Lin, RN Sent: 07/05/2019   3:45 PM EDT To: Wyatt Portela, MD  Patient was prescribed Synthroid 75 mcg when she was discharged from the hospital on 06/06/19. She said she was told to contact you for a refill. Ok to refill? Lanelle Bal

## 2019-07-05 NOTE — Telephone Encounter (Signed)
Refill sent to patients pharmacy. 

## 2019-07-08 ENCOUNTER — Telehealth: Payer: Self-pay | Admitting: Oncology

## 2019-07-08 NOTE — Telephone Encounter (Signed)
Scheduled per 06/18 los, patient has been called and notified.

## 2019-07-11 MED FILL — INLYTA 5 MG TABLET: 5 | 30 days supply | Qty: 60 | Fill #0

## 2019-07-12 ENCOUNTER — Other Ambulatory Visit: Payer: Self-pay | Admitting: Cardiology

## 2019-07-23 ENCOUNTER — Ambulatory Visit: Payer: Medicare Other | Admitting: Cardiology

## 2019-07-23 ENCOUNTER — Encounter: Payer: Medicare Other | Admitting: Nutrition

## 2019-07-23 ENCOUNTER — Telehealth: Payer: Self-pay | Admitting: Nutrition

## 2019-07-23 NOTE — Telephone Encounter (Signed)
Contacted patient by telephone for nutrition visit. She was not available so I left a message.  Patient returned call.  79 yo female diagnosed with Renal Cell Ca in 2019, s/p Nephrectomy receiving Pembrolizumab.  PMH includes CABG, PAD, Hypothyroidism, HTN, HLD, GERD, Diverticulosis, Depression, CAD, Gastric Bypass in 2004.  Medications include Tums, Prozac, Lasix, Synthroid, Immodium, Protonix, Probiotics, Compazine, and Mylanta.  Labs noted: Creatinine 1.56 and Albumin 3.3  Height: 67 inches. Weight: 202.4 pounds. UBW: 215 pounds in April. BMI: 31.7  Patient reports mouth pain/sores and increased fatigue. She is using MMW. Reports occasional vomiting if she eats something greasy. She is taking compazine. She denies problems with oral intake.  Nutrition diagnosis: Unintended wt loss related to renal cell cancer as evidenced by 6% loss over 3 months.  Intervention: Educated patient to consume smaller, meals/snacks throughout the day and include protein foods. Encouraged wt maintenance. Educated on strategies for improving mouth pain. Offered fact sheets however, patient declined.  Monitoring, Evaluation, goals:  Patient will tolerate adequate calories and protein for wt maintenance.  Next Visit: Patient will contact me with questions.

## 2019-07-26 ENCOUNTER — Inpatient Hospital Stay (HOSPITAL_BASED_OUTPATIENT_CLINIC_OR_DEPARTMENT_OTHER): Payer: Medicare Other | Admitting: Oncology

## 2019-07-26 ENCOUNTER — Other Ambulatory Visit: Payer: Self-pay

## 2019-07-26 ENCOUNTER — Inpatient Hospital Stay: Payer: Medicare Other

## 2019-07-26 ENCOUNTER — Inpatient Hospital Stay: Payer: Medicare Other | Attending: Oncology

## 2019-07-26 VITALS — BP 133/77 | HR 72 | Temp 97.5°F | Resp 18 | Ht 67.0 in | Wt 201.3 lb

## 2019-07-26 DIAGNOSIS — Z79899 Other long term (current) drug therapy: Secondary | ICD-10-CM | POA: Diagnosis not present

## 2019-07-26 DIAGNOSIS — C649 Malignant neoplasm of unspecified kidney, except renal pelvis: Secondary | ICD-10-CM | POA: Diagnosis not present

## 2019-07-26 DIAGNOSIS — C7802 Secondary malignant neoplasm of left lung: Secondary | ICD-10-CM | POA: Insufficient documentation

## 2019-07-26 DIAGNOSIS — E039 Hypothyroidism, unspecified: Secondary | ICD-10-CM | POA: Diagnosis not present

## 2019-07-26 DIAGNOSIS — C641 Malignant neoplasm of right kidney, except renal pelvis: Secondary | ICD-10-CM

## 2019-07-26 DIAGNOSIS — Z5112 Encounter for antineoplastic immunotherapy: Secondary | ICD-10-CM | POA: Insufficient documentation

## 2019-07-26 DIAGNOSIS — Z95828 Presence of other vascular implants and grafts: Secondary | ICD-10-CM | POA: Insufficient documentation

## 2019-07-26 LAB — CMP (CANCER CENTER ONLY)
ALT: 13 U/L (ref 0–44)
AST: 17 U/L (ref 15–41)
Albumin: 2.9 g/dL — ABNORMAL LOW (ref 3.5–5.0)
Alkaline Phosphatase: 75 U/L (ref 38–126)
Anion gap: 10 (ref 5–15)
BUN: 18 mg/dL (ref 8–23)
CO2: 20 mmol/L — ABNORMAL LOW (ref 22–32)
Calcium: 9.1 mg/dL (ref 8.9–10.3)
Chloride: 105 mmol/L (ref 98–111)
Creatinine: 1.22 mg/dL — ABNORMAL HIGH (ref 0.44–1.00)
GFR, Est AFR Am: 50 mL/min — ABNORMAL LOW (ref >60)
GFR, Estimated: 43 mL/min — ABNORMAL LOW (ref >60)
Glucose, Bld: 87 mg/dL (ref 70–99)
Potassium: 4.4 mmol/L (ref 3.5–5.1)
Sodium: 135 mmol/L (ref 135–145)
Total Bilirubin: 0.8 mg/dL (ref 0.3–1.2)
Total Protein: 6.3 g/dL — ABNORMAL LOW (ref 6.5–8.1)

## 2019-07-26 LAB — CBC WITH DIFFERENTIAL (CANCER CENTER ONLY)
Abs Immature Granulocytes: 0.03 10*3/uL (ref 0.00–0.07)
Basophils Absolute: 0.1 10*3/uL (ref 0.0–0.1)
Basophils Relative: 1 %
Eosinophils Absolute: 0.1 10*3/uL (ref 0.0–0.5)
Eosinophils Relative: 1 %
HCT: 39.9 % (ref 36.0–46.0)
Hemoglobin: 12.9 g/dL (ref 12.0–15.0)
Immature Granulocytes: 0 %
Lymphocytes Relative: 31 %
Lymphs Abs: 2.7 10*3/uL (ref 0.7–4.0)
MCH: 27.9 pg (ref 26.0–34.0)
MCHC: 32.3 g/dL (ref 30.0–36.0)
MCV: 86.4 fL (ref 80.0–100.0)
Monocytes Absolute: 0.9 10*3/uL (ref 0.1–1.0)
Monocytes Relative: 10 %
Neutro Abs: 4.9 10*3/uL (ref 1.7–7.7)
Neutrophils Relative %: 57 %
Platelet Count: 141 10*3/uL — ABNORMAL LOW (ref 150–400)
RBC: 4.62 MIL/uL (ref 3.87–5.11)
RDW: 18 % — ABNORMAL HIGH (ref 11.5–15.5)
WBC Count: 8.6 10*3/uL (ref 4.0–10.5)
nRBC: 0 % (ref 0.0–0.2)

## 2019-07-26 LAB — TSH: TSH: 24.776 u[IU]/mL — ABNORMAL HIGH (ref 0.308–3.960)

## 2019-07-26 IMAGING — CT CT ANGIO CHEST
3 of 13 series · 15 of 37 positions shown · IV contrast (iopamidol)
Comparison: CT the abdomen and pelvis 04/15/2011. No prior chest
CT.

CLINICAL DATA: 76-year-old female with history of severe aortic
stenosis. Preprocedural study prior to potential transcatheter
aortic valve replacement (TAVR) procedure.

EXAM:
CT ANGIOGRAPHY CHEST, ABDOMEN AND PELVIS
TECHNIQUE: Multidetector CT imaging through the chest, abdomen and pelvis was
performed using the standard protocol during bolus administration of
intravenous contrast. Multiplanar reconstructed images and MIPs were
obtained and reviewed to evaluate the vascular anatomy.
CONTRAST:  100mL [id number] IOPAMIDOL ([id number]) INJECTION 76%

[Series 5: best diast 82 % · axial · 0.48mm/px · z∈[+1253,+1395]mm · 5 of 535 slices shown]
[im 90/535  lung]
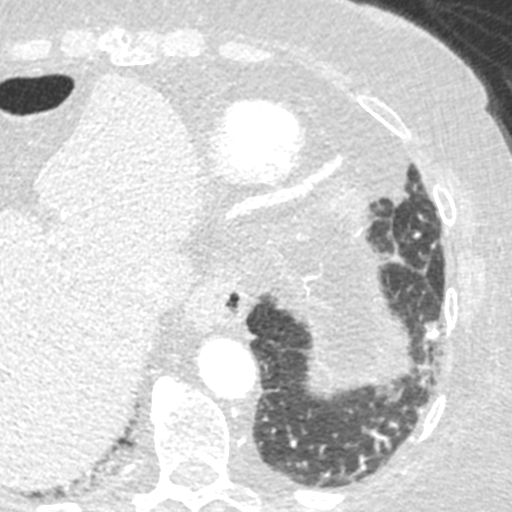
[im 179/535  lung]
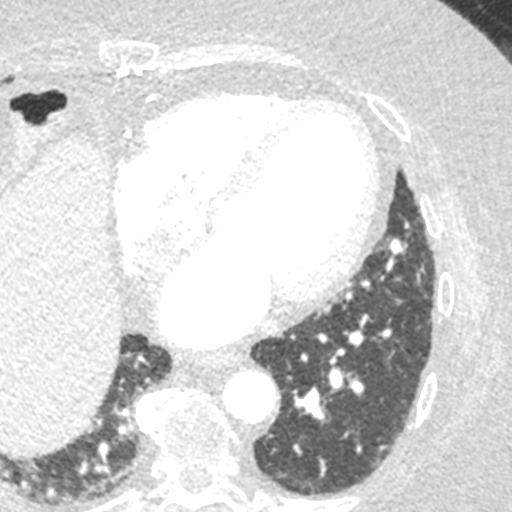
[im 268/535  lung]
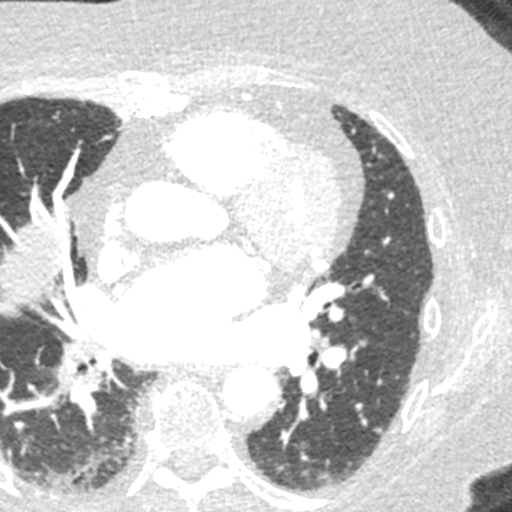
[im 357/535  lung]
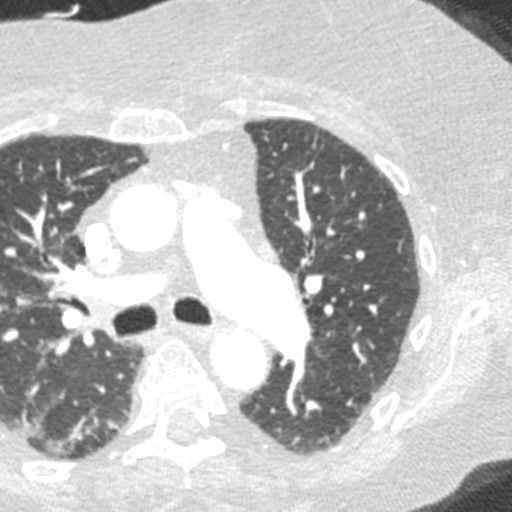
[im 446/535  lung]
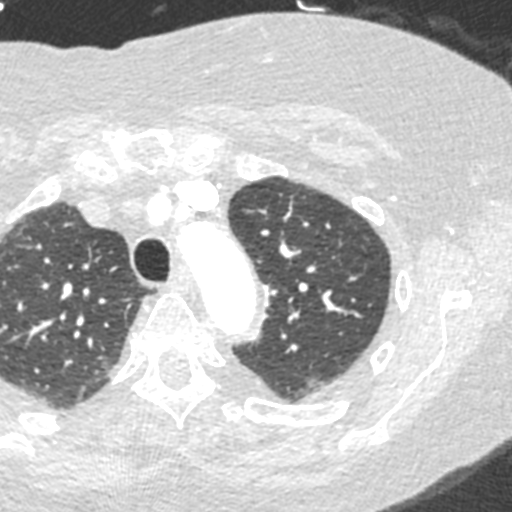

[Series 6: best syst 33 % · axial · 0.48mm/px · z∈[+1253,+1359]mm · 4 of 535 slices shown]
[im 90/535  lung]
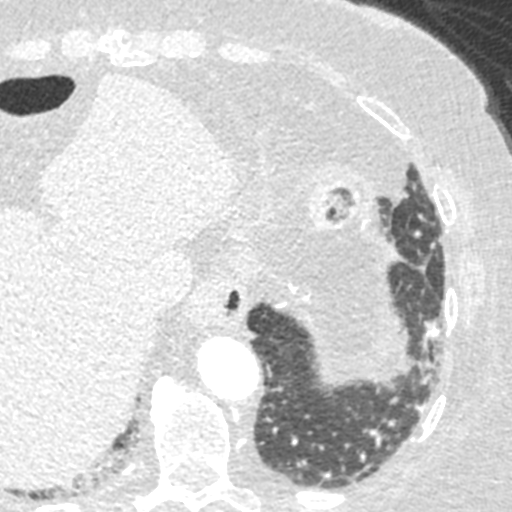
[im 179/535  lung]
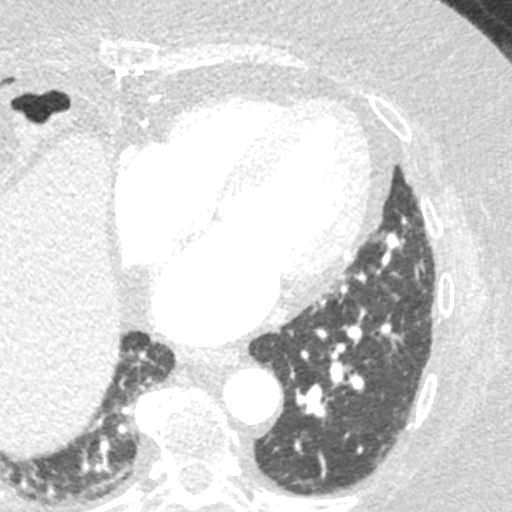
[im 268/535  lung]
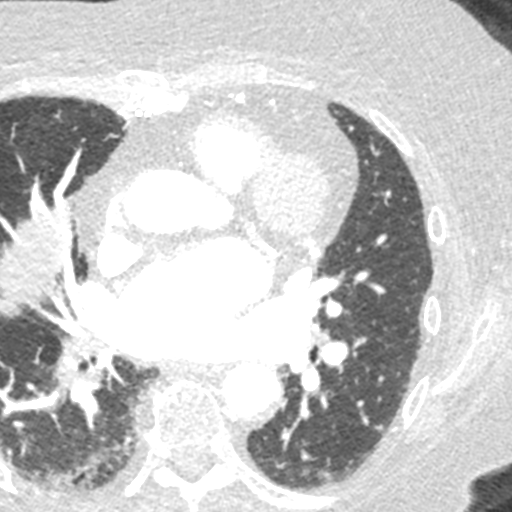
[im 357/535  lung]
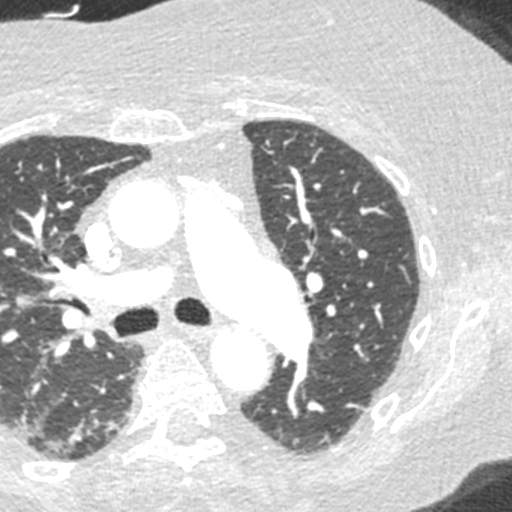

[Series 13: ax thins · axial · 0.59mm/px · z∈[+936,+1368]mm · 6 of 606 slices shown]
[im 87/606  lung]
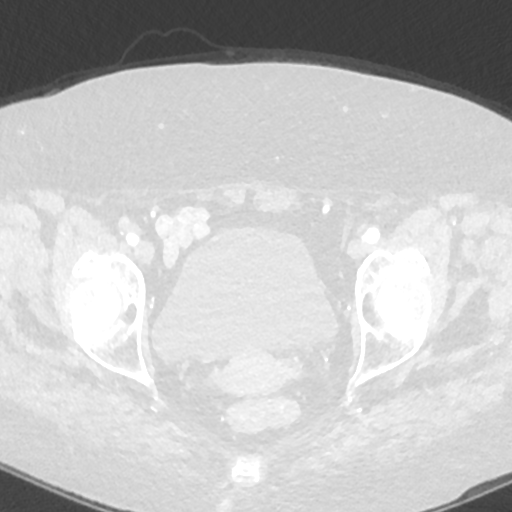
[im 173/606  mediastinal]
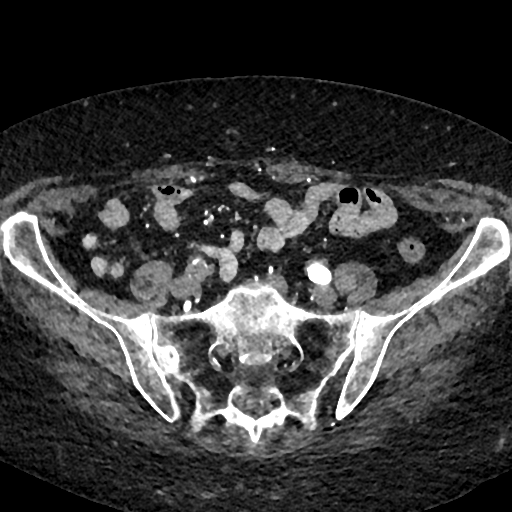
[im 260/606  lung]
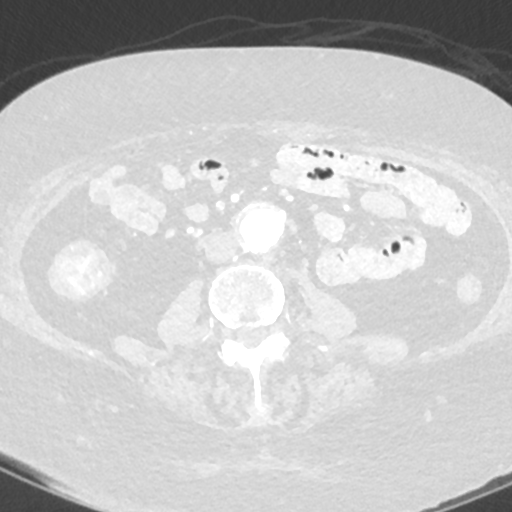
[im 346/606  mediastinal]
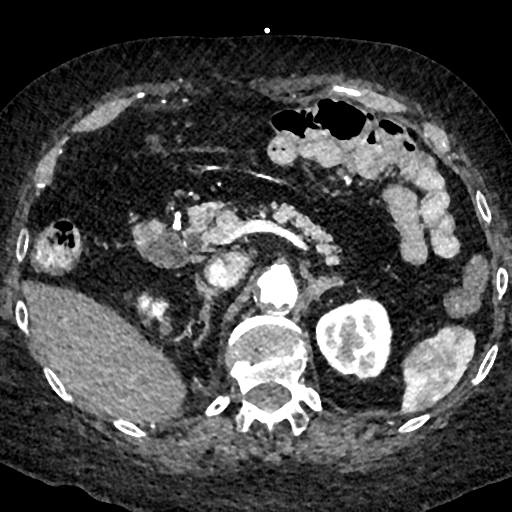
[im 433/606  lung]
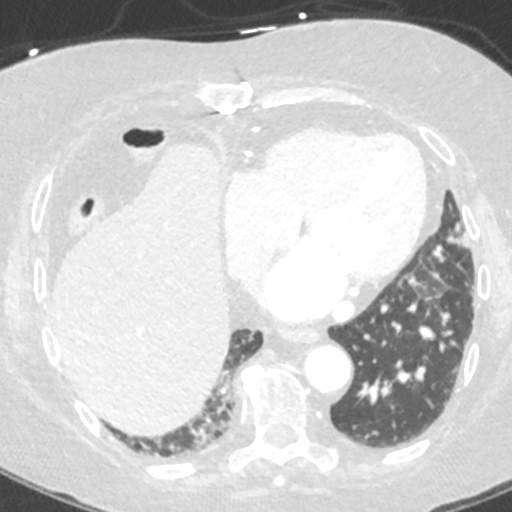
[im 519/606  mediastinal]
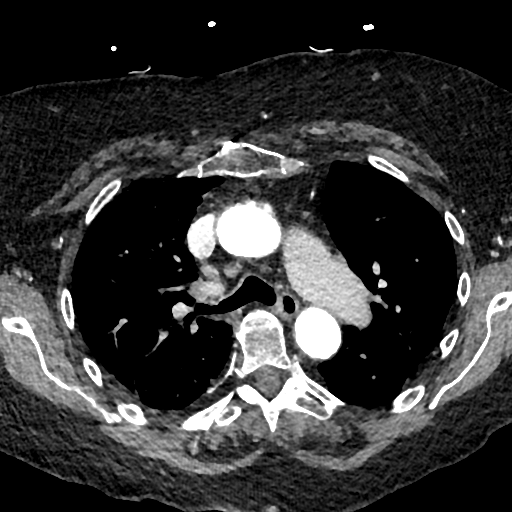

[15 of 37 positions shown; findings below may reference images not displayed]

FINDINGS: CTA CHEST FINDINGS

Cardiovascular: Heart size is enlarged with severe left atrial
dilatation. There is no significant pericardial fluid, thickening or
pericardial calcification. There is aortic atherosclerosis, as well
as atherosclerosis of the great vessels of the mediastinum and the
coronary arteries, including calcified atherosclerotic plaque in the
left anterior descending, left circumflex and right coronary
arteries. Status post median sternotomy for CABG. Severe thickening
calcification of the aortic valve. Mild calcification of the mitral
annulus.

Mediastinum/Lymph Nodes: No pathologically enlarged mediastinal or
hilar lymph nodes. Small hiatal hernia. Esophagus is otherwise
unremarkable in appearance. No axillary lymphadenopathy.

Lungs/Pleura: No suspicious appearing pulmonary nodules or masses.
No acute consolidative airspace disease. No pleural effusions.

Musculoskeletal/Soft Tissues: Median sternotomy wires. Old sternal
fracture with what appears to be fibrous union and posttraumatic
deformity. There are no aggressive appearing lytic or blastic
lesions noted in the visualized portions of the skeleton.

CTA ABDOMEN AND PELVIS FINDINGS

Hepatobiliary: No suspicious cystic or solid hepatic lesions. No
intra or extrahepatic biliary ductal dilatation. Status post
cholecystectomy.

Pancreas: No pancreatic mass. No pancreatic ductal dilatation. No
pancreatic or peripancreatic fluid or inflammatory changes.

Spleen: Unremarkable.

Adrenals/Urinary Tract: 3.0 x 4.1 x 3.1 cm enhancing mass in the
interpolar region of the right kidney (axial image 100 of series 15
and coronal image 67 of series 16). This lesion is new compared to
prior study from 04/15/2011, does not extend into the right renal
vein (which is widely patent), and is completely encapsulated within
Gerota's fascia. Several subcentimeter low-attenuation lesions in
both kidneys, too small to definitively characterize, but favored to
represent tiny cysts. No hydroureteronephrosis. Urinary bladder is
normal in appearance. Bilateral adrenal glands are normal in
appearance.

Stomach/Bowel: Postoperative changes of Roux-en-Y gastric bypass are
noted. No pathologic dilatation of small bowel or colon. Numerous
colonic diverticulae are noted, particularly in the region of the
sigmoid colon, without surrounding inflammatory changes to suggest
an acute diverticulitis at this time. The appendix is not
confidently identified and may be surgically absent. Regardless,
there are no inflammatory changes noted adjacent to the cecum to
suggest the presence of an acute appendicitis at this time.

Vascular/Lymphatic: Aortic atherosclerosis with fusiform aneurysmal
dilatation of the infrarenal abdominal aorta which measures up to
3.4 x 3.0 cm (axial image 113 of series 15). Vascular findings and
measurements pertinent to potential TAVR procedure, as detailed
below, including complete occlusion of the right common iliac artery
(with distal reconstitution of flow in the right external iliac and
right common femoral arteries secondary to collateralization in the
right hemipelvis). No lymphadenopathy noted in the abdomen or
pelvis.

Reproductive: Uterus and ovaries are a trophic and otherwise
unremarkable in appearance.

Other: No significant volume of ascites.  No pneumoperitoneum.

Musculoskeletal: Chronic appearing compression fracture of L1 with
20% loss of anterior vertebral body height. There are no aggressive
appearing lytic or blastic lesions noted in the visualized portions
of the skeleton.

VASCULAR MEASUREMENTS PERTINENT TO TAVR:

AORTA:

Minimal Aortic Xiameter-BB x 10 mm

Severity of Aortic Calcification-severe

RIGHT PELVIS:

Not assessed because there is complete occlusion of the right common
iliac artery (there is distal reconstitution of flow secondary to
collateralization).

LEFT PELVIS:

Left Common Iliac Artery -

Minimal Yiameter-J.O x 5.5 mm

Tortuosity-mild

Calcification-moderate

Left External Iliac Artery -

Minimal Niameter-3.L x 6.1 mm

Tortuosity-mild

Calcification-mild-to-moderate

Left Common Femoral Artery -

Minimal Hiameter-K.T x 7.3 mm

Tortuosity-mild

Calcification-moderate

Review of the MIP images confirms the above findings.
IMPRESSION: 1. Vascular findings and measurements pertinent to potential TAVR
procedure, as detailed above. Please note that there is complete
occlusion of the right common iliac artery.
2. Severe thickening calcification of the aortic valve, compatible
with the reported clinical history of severe aortic stenosis.
3. New large right renal mass measuring 3.0 x 4.1 x 3.1 cm highly
concerning for renal cell carcinoma. This is completely encapsulated
within Gerota's fascia, does not involve the right renal vein, and
is not associated with local lymphadenopathy or definite findings to
suggest metastatic disease in the chest, abdomen or pelvis. Urologic
consultation is recommended in the near future for further
evaluation.
4. Aortic atherosclerosis, in addition to left main and 3 vessel
coronary artery disease. Status post median sternotomy for CABG.
5. In addition, there is an infrarenal abdominal aortic aneurysm
measuring up to 3.4 x 3.0 cm (mean diameter of 3.2 cm). Recommend
followup by ultrasound in 3 years. This recommendation follows ACR
consensus guidelines: White Paper of the ACR Incidental Findings
Committee II on Vascular Findings. [HOSPITAL] [id number];
[DATE].
6. Cardiomegaly with severe left atrial dilatation.
7. Mild colonic diverticulosis without evidence to suggest an acute
diverticulitis at this time.
8. Additional incidental findings, as above.

Aortic Atherosclerosis (55TMV-66J.J). Aortic aneurysm NOS
(55TMV-Q77.2).

## 2019-07-26 MED ORDER — SODIUM CHLORIDE 0.9 % IV SOLN
200.0000 mg | Freq: Once | INTRAVENOUS | Status: AC
  Administered 2019-07-26: 200 mg via INTRAVENOUS
  Filled 2019-07-26: qty 8

## 2019-07-26 MED ORDER — SODIUM CHLORIDE 0.9 % IV SOLN
Freq: Once | INTRAVENOUS | Status: AC
  Filled 2019-07-26: qty 250

## 2019-07-26 MED ORDER — SODIUM CHLORIDE 0.9% FLUSH
10.0000 mL | INTRAVENOUS | Status: DC | PRN
  Administered 2019-07-26: 10 mL
  Filled 2019-07-26: qty 10

## 2019-07-26 MED ORDER — SODIUM CHLORIDE 0.9% FLUSH
10.0000 mL | Freq: Once | INTRAVENOUS | Status: AC
  Administered 2019-07-26: 10 mL
  Filled 2019-07-26: qty 10

## 2019-07-26 MED ORDER — HEPARIN SOD (PORK) LOCK FLUSH 100 UNIT/ML IV SOLN
500.0000 [IU] | Freq: Once | INTRAVENOUS | Status: AC | PRN
  Administered 2019-07-26: 500 [IU]
  Filled 2019-07-26: qty 5

## 2019-07-26 NOTE — Progress Notes (Signed)
Hematology and Oncology Follow Up Visit  [name] [id number] Aug 02, 1940 79 y.o. 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   Prior Therapy: She underwent right laparoscopic radical nephrectomy completed by Dr. Alinda Money on December 28, 2017.  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  The final pathological stage was T1b.     Current therapy: Pembrolizumab 200 mg every 3 weeks started on April 25, 2019 with axitinib 5 mg daily.  She is here for cycle 5 of therapy.  Interim History: [name] is here for a follow-up visit. Since her last visit, she reports no major changes in her health.  She has reported improvement in her quality of life after decreasing the axitinib 5 mg total dose.  He has reported less mouth sores or pain.  She denies any shortness of breath or difficulty breathing.  She does report overall fatigue and tiredness however.  She denies any diarrhea dyspnea on exertion.      Medications: Unchanged on review. Current Outpatient Medications  Medication Sig Dispense Refill  . acetaminophen (TYLENOL) 500 MG tablet Take 1,000 mg by mouth every 6 (six) hours as needed for moderate pain or headache.     . ALPRAZolam (XANAX) 0.25 MG tablet Take 0.25 mg by mouth 3 (three) times daily as needed for anxiety.  0  . amLODipine (NORVASC) 10 MG tablet Take 1 tablet (10 mg total) by mouth daily. 90 tablet 1  . amoxicillin (AMOXIL) 500 MG tablet Take 4 tablets (2,000 mg total) by mouth as directed. 1 hour prior to dental work including cleanings 12 tablet 12  . aspirin EC 81 MG tablet Take 81 mg by mouth at bedtime.     . calcium elemental as carbonate (BARIATRIC TUMS ULTRA) 400 MG chewable tablet Chew 1,000 mg by mouth 3 (three) times daily as needed for  heartburn.    . clobetasol ointment (TEMOVATE) 8.85 % Apply 1 application topically 2 (two) times daily as needed (Rash).     . diclofenac Sodium (VOLTAREN) 1 % GEL Apply 4 g topically 3 (three) times daily as needed (Right knee pain).     [name] FLUoxetine (PROZAC) 40 MG capsule Take 40 mg by mouth every evening.    . furosemide (LASIX) 20 MG tablet Take 20 mg by mouth daily as needed for edema.    . INLYTA 5 MG tablet TAKE 1 TABLET (5 MG TOTAL) BY MOUTH 2 (TWO) TIMES DAILY. (Patient taking differently: Take 5 mg by mouth once. Ppt taking 1 tablet daily) 60 tablet 0  . irbesartan (AVAPRO) 300 MG tablet TAKE 1 TABLET(300 MG) BY MOUTH DAILY 90 tablet 1  . levothyroxine (SYNTHROID) 75 MCG tablet TAKE 1 TABLET(75 MCG) BY MOUTH DAILY AT 6 AM 90 tablet 3  . loperamide (IMODIUM) 2 MG capsule Take 2-4 mg by mouth 4 (four) times daily as needed for diarrhea or loose stools.     . magic mouthwash SOLN Take 5 mLs by mouth.    . magic mouthwash w/lidocaine SOLN Take 5 mLs by mouth 4 (four) times daily as needed for mouth pain. Swish and spit 240 mL 0  . Nebivolol HCl (BYSTOLIC) 20 MG TABS Take 1 tablet (20 mg total) by mouth daily. 30 tablet 11  . nitroGLYCERIN (NITROSTAT) 0.4 MG SL tablet  Place 0.4 mg under the tongue every 5 (five) minutes as needed for chest pain (MAX 3 TABLETS).    [name] nystatin (MYCOSTATIN/NYSTOP) powder Apply 1 application topically 3 (three) times daily.     . pantoprazole (PROTONIX) 40 MG tablet TAKE 1 TABLET BY MOUTH EVERY DAY (Patient taking differently: Take 40 mg by mouth daily. ) 90 tablet 1  . PROAIR HFA 108 (90 Base) MCG/ACT inhaler Inhale 2 puffs into the lungs every 4 (four) hours as needed for shortness of breath or wheezing.  2  . Probiotic Product (PROBIOTIC PO) Take 1 capsule by mouth daily.    . prochlorperazine (COMPAZINE) 10 MG tablet Take 1 tablet (10 mg total) by mouth every 6 (six) hours as needed for nausea or vomiting. 30 tablet 3  . simethicone (MYLICON) 408 MG chewable  tablet Chew 125 mg by mouth every 6 (six) hours as needed for flatulence.    . simvastatin (ZOCOR) 40 MG tablet Take 1 tablet (40 mg total) by mouth at bedtime. 90 tablet 3  . SYMBICORT 160-4.5 MCG/ACT inhaler Inhale 1 puff into the lungs 2 (two) times daily as needed (for respiratory issues.).   2   No current facility-administered medications for this visit.     Allergies:  Allergies  Allergen Reactions  . Chantix [Varenicline Tartrate] Hives  . Dipyridamole Hives and Rash      Physical Exam:  Blood pressure 133/77, pulse 72, temperature (!) 97.5 F (36.4 C), temperature source Temporal, resp. rate 18, height 5\' 7"  (1.702 m), weight 201 lb 4.8 oz (91.3 kg), SpO2 99 %.    ECOG: 1      General appearance: Alert, awake without any distress. Head: Atraumatic without abnormalities Oropharynx: Without any thrush or ulcers. Eyes: No scleral icterus. Lymph nodes: No lymphadenopathy noted in the cervical, supraclavicular, or axillary nodes Heart:regular rate and rhythm, without any murmurs or gallops.   Lung: Clear to auscultation without any rhonchi, wheezes or dullness to percussion. Abdomin: Soft, nontender without any shifting dullness or ascites. Musculoskeletal: No clubbing or cyanosis. Neurological: No motor or sensory deficits. Skin: No rashes or lesions.       Lab Results: Lab Results  Component Value Date   WBC 9.4 06/28/2019   HGB 13.7 06/28/2019   HCT 42.7 06/28/2019   MCV 83.9 06/28/2019   PLT 138 (L) 06/28/2019     Chemistry      Component Value Date/Time   NA 137 06/28/2019 0841   NA 139 03/31/2017 1133   K 3.7 06/28/2019 0841   CL 104 06/28/2019 0841   CO2 21 (L) 06/28/2019 0841   BUN 16 06/28/2019 0841   BUN 17 03/31/2017 1133   CREATININE 1.56 (H) 06/28/2019 0841   CREATININE 0.62 09/26/2014 1043      Component Value Date/Time   CALCIUM 9.5 06/28/2019 0841   ALKPHOS 78 06/28/2019 0841   AST 17 06/28/2019 0841   ALT 10 06/28/2019 0841    BILITOT 1.7 (H) 06/28/2019 0841       IMPRESSION: 1. No acute pulmonary embolism. 2. Significant interval decrease in size of the previously demonstrated mediastinal and hilar lymph nodes with significant interval decrease in size of the previously demonstrated left lower lobe lung mass. Findings are consistent with a positive response to treatment. 3. Cardiomegaly with mild volume overload. 4. Additional chronic findings as detailed above.  Aortic Atherosclerosis (ICD10-I70.0).  Impression and Plan:  79 year old woman with:  1.   Renal cell carcinoma diagnosed in March 2021.  She was found to have stage IV clear-cell with lung involvement.   The natural course of this disease was reviewed and treatment options were reiterated. Continuing the current therapy versus discontinuation of axitinib. The plan is to repeat imaging studies in August 2021. Imaging studies in May 2021 showed excellent response to therapy.  He is agreeable at this time.  2.  IV access: Port-A-Cath remains in use without any issues.  3.  Antiemetics: Antiemetics are available to her without any recent nausea or vomiting.  4.  Goals of care and prognosis: Therapy remains palliative although aggressive measures are warranted given her reasonable performance status.  5.  Immune mediated complications: I continue to educate her about potential issues including pneumonitis, colitis and thyroid disease.  6.  Hypertension: Blood pressure is much improved at this time.  7.  Hypothyroidism: Continues to be on Synthroid and thyroid function will continue to be monitored.  8.  Follow-up: In 3 weeks for the next cycle of therapy.    30 minutes were dedicated to this visit. The time was spent on updating her disease status) options and addressing complications of therapy as well as reviewing previous imaging studies.    Zola Button, MD 7/16/20219:40 AM

## 2019-07-26 NOTE — Progress Notes (Signed)
Okay to treat with labs today Creatinine of 1.2

## 2019-07-30 ENCOUNTER — Telehealth: Payer: Self-pay | Admitting: Oncology

## 2019-07-30 NOTE — Telephone Encounter (Signed)
Scheduled per 07/16 los, patient has been called and notified.  

## 2019-07-31 ENCOUNTER — Ambulatory Visit (HOSPITAL_COMMUNITY)
Admission: EM | Admit: 2019-07-31 | Discharge: 2019-07-31 | Disposition: A | Discharge status: Home / Self Care | Payer: Medicare Other | Attending: Family Medicine | Admitting: Family Medicine

## 2019-07-31 ENCOUNTER — Other Ambulatory Visit: Payer: Self-pay

## 2019-07-31 ENCOUNTER — Encounter (HOSPITAL_COMMUNITY): Payer: Self-pay

## 2019-07-31 DIAGNOSIS — T162XXA Foreign body in left ear, initial encounter: Secondary | ICD-10-CM | POA: Diagnosis not present

## 2019-07-31 NOTE — ED Provider Notes (Signed)
[place]    CSN: [id number] Arrival date & time: 07/31/19  0915      History   Chief Complaint Chief Complaint  Patient presents with  . foreign object in ear    HPI [name] is a 79 y.o. female.   Pt is a 79 year old female who wears hearing aids. Had ears cleaned at the [facility] yesterday and physician noted a piece of her hearing aid in her left ear. Physician was unable to remove it. Presenting to have piece of hearing aid removed from ear.  ROS per HPI      Past Medical History:  Diagnosis Date  . Abdominal aortic aneurysm ([place])    infrarenal diagnosed with maximum measurement at 3.3 cm  . Cancer ([place])   . Coronary artery disease   . Depression   . Diverticulosis   . GERD (gastroesophageal reflux disease)   . History of tobacco abuse    Recurrent cigarette smoking  . Hyperlipidemia   . Hyperplastic colon polyp   . Hypertension   . Hypothyroidism   . Ischemic heart disease    had redo bypass surgery in 2001, at that time she had a free right internal mammary to the LAD, LAD endarterectomy, sequential vein graft to the OM1 and OM2 -- last catheterization was in 2008  . Leg fracture    remote right leg fracture  . Microscopic colitis   . Obesity    with gastric bypass surgery in 2004  . PAD (peripheral artery disease) (HCC)    right iliac occlusion  . PONV (postoperative nausea and vomiting)   . Right kidney mass   . S/P CABG x 4 1985   LIMA to LAD, Sequential SVG to OM1-OM2, SVG to RCA - Dr Redmond Pulling  . S/P redo CABG x 3 2001   Free RIMA to LAD with endarterectomy, sequential SVG to OM1-OM2 - Dr Servando Snare  . 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 Port-A-Cath in place 07/26/2019  . Acute respiratory failure with hypoxia ([place]) 06/03/2019  . Acute encephalopathy  06/03/2019  . UTI (urinary tract infection) 06/02/2019  . Hypoxia 06/02/2019  . Goals of care, counseling/discussion 04/11/2019  . Severe sepsis ([place])   . Macrocytic anemia   . Abdominal wall abscess at site of surgical wound 01/11/2018  . Hypothyroidism 01/11/2018  . Anxiety 01/11/2018  . COPD (chronic obstructive pulmonary disease) ([place]) 01/11/2018  . AKI (acute kidney injury) ([place]) 01/11/2018  . Chronic diastolic CHF (congestive heart failure) ([place]) 01/11/2018  . Renal cell carcinoma ([place]) 01/11/2018  . Neoplasm of right kidney 12/28/2017  . Severe aortic stenosis 05/09/2017  . 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 GERD (gastroesophageal reflux disease) 10/19/2016  . 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 Carotid arterial disease ([place]) 05/28/2015  . Bilateral carotid bruits 01/16/2015  . Reflux esophagitis   . Hiatal hernia   . Status post bariatric surgery   . Chronic diarrhea   . Diverticulosis of colon without hemorrhage   . PAD (peripheral artery disease) ([place]) 04/13/2011  . Aortic stenosis, severe 09/10/2010  . CAD (coronary artery disease) 08/11/2010  . HTN (hypertension) 08/11/2010  . Obesities, morbid ([place]) 08/11/2010  . Tobacco abuse 08/11/2010  . Abdominal aortic aneurysm ([place]) 12/28/2007  . DIVERTICULOSIS, COLON 12/28/2007  . FATTY LIVER DISEASE 12/28/2007  .  ANEMIA, HX OF 12/28/2007  . NAUSEA 12/12/2007  . ABDOMINAL PAIN, EPIGASTRIC 12/12/2007  . S/P redo CABG x 3 03/09/1999  . S/P CABG x 4 04/19/1983    Past Surgical History:  Procedure Laterality Date  . ARTERIAL LINE INSERTION Right 05/09/2017   Procedure: ARTERIAL LINE  INSERTION  RIGHT RADIAL ARTERY;  Surgeon: Sherren Mocha, MD;  Location: [facility];  Service: Open Heart Surgery;  Laterality: Right;  . CARDIAC CATHETERIZATION  02/16/1999   Left heart catheterization with selective coronary  angiography, left ventricular angiography (RAO and LAO views), angiography of left internal  mammary artery, and angiography of saphenous vein grafts  . CARDIAC CATHETERIZATION  10/20/2006   Est. EF of 55% -- Totally occuladed native coronary circulation -- Persistent patency of the right mammary graft and the distal LAD artery with peristent patency of the left mammary graft to the proximal LAD artery with persistant patency of the saphenous vein graft to the sequentail branches of the OM        . CARPAL TUNNEL RELEASE    . CHOLECYSTECTOMY    . COLONOSCOPY  07/12/2007   Dr.Brodie- normal cecum and rectum, diverticulosis in the sigmoid colon  . COLONOSCOPY  1991   per Dr.Brodie office visit note= hyperplastic polyp  . COLONOSCOPY N/A 09/30/2014   Dr.Rourk- noraml appearing rectal mucosa, scattered L sided diverticula, colonic mucosa has a somewhat pale friable appearence diffusely, no ulcers or erosions seen, the distal 10cm of terminal ileum appeared entirely normal. bx=benign colorectal mucosa with lymphocytic colitis  . CORONARY ARTERY BYPASS GRAFT  1985  . CORONARY ARTERY BYPASS GRAFT  03/09/1999   Redo CABG x3 --  with the free right internal mammary artey to the LAD with an LAD endarterectomy -- Sequential saphenous vein graft to OM1 and OM2                   . CYSTOSCOPY/RETROGRADE/URETEROSCOPY Right 12/21/2017   Procedure: BOFBPZWCHE/NIDPOEUMPN;  Surgeon: Raynelle Bring, MD;  Location: [facility];  Service: Urology;  Laterality: Right;  . ESOPHAGOGASTRODUODENOSCOPY N/A 09/30/2014   Dr.Rourk-mild erosive reflux esophagitis s/p bariatric surgery  . EYE SURGERY     catracts  . GASTRIC BYPASS  2004  . INCISION AND DRAINAGE ABSCESS N/A 01/11/2018   Procedure: INCISION AND DRAINAGE ABSCESS;  Surgeon: Raynelle Bring, MD;  Location: [facility];  Service: Urology;  Laterality: N/A;  . IR IMAGING GUIDED PORT INSERTION  05/09/2019  . LAPAROSCOPIC NEPHRECTOMY Right 12/28/2017   Procedure: LAPAROSCOPIC RADICAL NEPHRECTOMY;  Surgeon: Raynelle Bring, MD;  Location: [facility];  Service: Urology;  Laterality:  Right;  . RIGHT/LEFT HEART CATH AND CORONARY/GRAFT ANGIOGRAPHY N/A 04/04/2017   Procedure: RIGHT/LEFT HEART CATH AND CORONARY/GRAFT ANGIOGRAPHY;  Surgeon: [place], Peter M, MD;  Location: [facility];  Service: Cardiovascular;  Laterality: N/A;  . TEE WITHOUT CARDIOVERSION N/A 05/09/2017   Procedure: TRANSESOPHAGEAL ECHOCARDIOGRAM (TEE);  Surgeon: Sherren Mocha, MD;  Location: [place] [facility];  Service: Open Heart Surgery;  Laterality: N/A;  . TRANSCATHETER AORTIC VALVE REPLACEMENT, TRANSFEMORAL N/A 05/09/2017   Procedure: TRANSCATHETER AORTIC VALVE REPLACEMENT, TRANSFEMORAL using a 22mm Edwards Sapien 3 Aortic Valve;  Surgeon: Sherren Mocha, MD;  Location: [facility];  Service: Open Heart Surgery;  Laterality: N/A;    OB History   No obstetric history on file.      Home Medications    Prior to Admission medications   Medication Sig Start Date End Date Taking? Authorizing Provider  acetaminophen (TYLENOL) 500 MG tablet Take 1,000  mg by mouth every 6 (six) hours as needed for moderate pain or headache.     [provider]  ALPRAZolam Duanne Moron) 0.25 MG tablet Take 0.25 mg by mouth 3 (three) times daily as needed for anxiety. 03/20/17   [provider]  amLODipine (NORVASC) 10 MG tablet Take 1 tablet (10 mg total) by mouth daily. 03/22/19   [place], Peter M, MD  aspirin EC 81 MG tablet Take 81 mg by mouth at bedtime.     [provider]  calcium elemental as carbonate (BARIATRIC TUMS ULTRA) 400 MG chewable tablet Chew 1,000 mg by mouth 3 (three) times daily as needed for heartburn.    [provider]  clobetasol ointment (TEMOVATE) 1.61 % Apply 1 application topically 2 (two) times daily as needed ([place]).  04/03/19 04/02/20  [provider]  diclofenac Sodium (VOLTAREN) 1 % GEL Apply 4 g topically 3 (three) times daily as needed (Right knee pain).     [provider]  FLUoxetine (PROZAC) 40 MG capsule Take 40 mg by mouth every evening.    [provider]  furosemide (LASIX) 20 MG tablet Take 20 mg by mouth daily as needed for edema.    [provider]  INLYTA 5 MG tablet TAKE 1 TABLET (5 MG TOTAL) BY MOUTH 2 (TWO) TIMES DAILY. Patient taking differently: Take 5 mg by mouth once. Ppt taking 1 tablet daily 07/01/19   Wyatt Portela, MD  irbesartan (AVAPRO) 300 MG tablet TAKE 1 TABLET(300 MG) BY MOUTH DAILY 07/12/19   [place], Peter M, MD  levothyroxine (SYNTHROID) 75 MCG tablet TAKE 1 TABLET(75 MCG) BY MOUTH DAILY AT 6 AM 07/05/19   Wyatt Portela, MD  loperamide (IMODIUM) 2 MG capsule Take 2-4 mg by mouth 4 (four) times daily as needed for diarrhea or loose stools.     [provider]  magic mouthwash SOLN Take 5 mLs by mouth.    [provider]  magic mouthwash w/lidocaine SOLN Take 5 mLs by mouth 4 (four) times daily as needed for mouth pain. Swish and spit 07/02/19   Wyatt Portela, MD  Nebivolol HCl (BYSTOLIC) 20 MG TABS Take 1 tablet (20 mg total) by mouth daily. 02/22/19   [place], Peter M, MD  nitroGLYCERIN (NITROSTAT) 0.4 MG SL tablet Place 0.4 mg under the tongue every 5 (five) minutes as needed for chest pain (MAX 3 TABLETS).    [provider]  nystatin (MYCOSTATIN/NYSTOP) powder Apply 1 application topically 3 (three) times daily.  12/12/18   [provider]  pantoprazole (PROTONIX) 40 MG tablet TAKE 1 TABLET BY MOUTH EVERY DAY Patient taking differently: Take 40 mg by mouth daily.  02/20/19   Annitta Needs, NP  PROAIR HFA 108 [phone] Base) MCG/ACT inhaler Inhale 2 puffs into the lungs every 4 (four) hours as needed for shortness of breath or wheezing. 03/14/17   [provider]  Probiotic Product (PROBIOTIC PO) Take 1 capsule by mouth daily.    [provider]  prochlorperazine (COMPAZINE) 10 MG tablet Take 1 tablet (10 mg total) by mouth every 6 (six) hours as needed for nausea or vomiting. 06/28/19   Wyatt Portela, MD  simethicone (MYLICON) 604 MG chewable tablet Chew  125 mg by mouth every 6 (six) hours as needed for flatulence.    [provider]  simvastatin (ZOCOR) 40 MG tablet Take 1 tablet (40 mg total) by mouth at bedtime. 03/18/19   [place], Peter M, MD  SYMBICORT 160-4.5 MCG/ACT inhaler Inhale 1 puff into the lungs 2 (two) times daily as needed (for respiratory issues.).  03/14/17   [provider]    Family History Family History  Problem Relation Age of Onset  . Stroke Father   . Diabetes Mother   . Pancreatic cancer Mother     Social History Social History   Tobacco Use  . Smoking status: Current Some Day Smoker    Packs/day: 0.50    Types: Cigarettes    Start date: 01/10/1985    Last attempt to quit: 02/11/2016    Years since quitting: 3.4  . Smokeless tobacco: Never Used  . Tobacco comment: smokes 1-2 cigarettes a day  Vaping Use  . Vaping Use: Never used  Substance Use Topics  . Alcohol use: Yes    Alcohol/week: 0.0 standard drinks    Comment: rarely wine  . Drug use: No     Allergies   Chantix [varenicline tartrate] and Dipyridamole   Review of Systems Review of Systems  Constitutional: Negative.   HENT: Positive for hearing loss.      Physical Exam Triage Vital Signs ED Triage Vitals  Enc Vitals Group     BP 07/31/19 0929 (!) 143/106     Pulse Rate 07/31/19 0929 71     Resp 07/31/19 0929 16     Temp 07/31/19 0929 (!) 97.4 F (36.3 C)     Temp Source 07/31/19 0929 Oral     SpO2 07/31/19 0929 97 %     Weight 07/31/19 0930 199 lb (90.3 kg)     Height 07/31/19 0930 5' 7.5" (1.715 m)     Head Circumference --      Peak Flow --      Pain Score 07/31/19 0930 0     Pain Loc --      Pain Edu? --      Excl. in [place]? --    No data found.  Updated Vital Signs BP (!) 143/106   Pulse 71   Temp (!) 97.4 F (36.3 C) (Oral)   Resp 16   Ht 5' 7.5" (1.715 m)   Wt 199 lb (90.3 kg)   SpO2 97%   BMI 30.71 kg/m   Visual Acuity Right Eye Distance:   Left Eye Distance:   Bilateral Distance:     Right Eye Near:   Left Eye Near:    Bilateral Near:     Physical Exam Constitutional:      Appearance: Normal appearance. She is normal weight.  HENT:     Head: Normocephalic.     Right Ear: Decreased hearing noted.     Left Ear: Decreased hearing noted. A foreign body is present.     Ears:   Neurological:     Mental Status: She is alert.      UC Treatments / Results  Labs (all labs ordered are listed, but only abnormal results are displayed) Labs Reviewed - No data to display  EKG   Radiology No results found.  Procedures Foreign Body Removal  Date/Time: 07/31/2019 12:03 PM Performed by: Orvan July, NP Authorized by: Orvan July, NP   Consent:    Consent obtained:  Verbal   Consent given by:  Patient   Risks discussed:  Incomplete removal   Alternatives discussed:  No treatment Location:    Location:  Ear   Ear location:  L ear Anesthesia (see MAR for exact dosages):    Anesthesia method:  None  Procedure type:    Procedure complexity:  Simple Procedure details:    Foreign bodies recovered:  1   Description:  Plastic covering    Intact foreign body removal: yes   Post-procedure details:    Patient tolerance of procedure:  Tolerated well, no immediate complications   (including critical care time)  Medications Ordered in UC Medications - No data to display  Initial Impression / Assessment and Plan / UC Course  I have reviewed the triage vital signs and the nursing notes.  Pertinent labs & imaging results that were available during my care of the patient were reviewed by me and considered in my medical decision making (see chart for details).     Foreign body in left ear. Removed small piece of plastic from hearing aid from left ear canal Patient tolerated well. Final Clinical Impressions(s) / UC Diagnoses   Final diagnoses:  Foreign body in ear, left, initial encounter     Discharge Instructions     We removed the plastic piece  from your ear.  Follow up as needed for continued or worsening symptoms     ED Prescriptions    None     PDMP not reviewed this encounter.   Orvan July, NP 07/31/19 1203

## 2019-07-31 NOTE — Discharge Instructions (Addendum)
We removed the plastic piece from your ear.  Follow up as needed for continued or worsening symptoms

## 2019-07-31 NOTE — ED Triage Notes (Signed)
Pt states there is a piece of her hearing aid stuck in her left ear.

## 2019-08-02 ENCOUNTER — Encounter (HOSPITAL_COMMUNITY): Payer: Medicare Other

## 2019-08-02 ENCOUNTER — Other Ambulatory Visit (HOSPITAL_COMMUNITY): Payer: Medicare Other

## 2019-08-11 ENCOUNTER — Other Ambulatory Visit: Payer: Self-pay | Admitting: Gastroenterology

## 2019-08-12 ENCOUNTER — Other Ambulatory Visit: Payer: Self-pay | Admitting: Oncology

## 2019-08-12 DIAGNOSIS — C641 Malignant neoplasm of right kidney, except renal pelvis: Secondary | ICD-10-CM

## 2019-08-13 NOTE — Telephone Encounter (Signed)
Patient needs ov for further refills

## 2019-08-15 ENCOUNTER — Other Ambulatory Visit: Payer: Self-pay

## 2019-08-15 ENCOUNTER — Ambulatory Visit (HOSPITAL_COMMUNITY)
Admission: RE | Admit: 2019-08-15 | Discharge: 2019-08-15 | Disposition: A | Discharge status: Home / Self Care | Payer: Medicare Other | Source: Ambulatory Visit | Attending: Oncology | Admitting: Oncology

## 2019-08-15 DIAGNOSIS — C641 Malignant neoplasm of right kidney, except renal pelvis: Secondary | ICD-10-CM

## 2019-08-15 MED ORDER — MAGIC MOUTHWASH W/LIDOCAINE: 5.0000 mL | Freq: Four times a day (QID) | ORAL | 0 refills | Status: DC | PRN

## 2019-08-16 ENCOUNTER — Inpatient Hospital Stay (HOSPITAL_BASED_OUTPATIENT_CLINIC_OR_DEPARTMENT_OTHER): Payer: Medicare Other | Admitting: Oncology

## 2019-08-16 ENCOUNTER — Inpatient Hospital Stay: Payer: Medicare Other

## 2019-08-16 ENCOUNTER — Inpatient Hospital Stay: Payer: Medicare Other | Attending: Oncology

## 2019-08-16 ENCOUNTER — Other Ambulatory Visit: Payer: Self-pay

## 2019-08-16 VITALS — BP 128/78 | HR 60 | Resp 17

## 2019-08-16 VITALS — BP 99/65 | HR 69 | Temp 97.5°F | Resp 17 | Ht 67.5 in | Wt 198.3 lb

## 2019-08-16 DIAGNOSIS — Z905 Acquired absence of kidney: Secondary | ICD-10-CM | POA: Insufficient documentation

## 2019-08-16 DIAGNOSIS — R911 Solitary pulmonary nodule: Secondary | ICD-10-CM | POA: Diagnosis not present

## 2019-08-16 DIAGNOSIS — C649 Malignant neoplasm of unspecified kidney, except renal pelvis: Secondary | ICD-10-CM | POA: Diagnosis present

## 2019-08-16 DIAGNOSIS — Z95828 Presence of other vascular implants and grafts: Secondary | ICD-10-CM

## 2019-08-16 DIAGNOSIS — C7800 Secondary malignant neoplasm of unspecified lung: Secondary | ICD-10-CM | POA: Diagnosis not present

## 2019-08-16 DIAGNOSIS — I10 Essential (primary) hypertension: Secondary | ICD-10-CM | POA: Diagnosis not present

## 2019-08-16 DIAGNOSIS — E039 Hypothyroidism, unspecified: Secondary | ICD-10-CM | POA: Diagnosis not present

## 2019-08-16 DIAGNOSIS — R5383 Other fatigue: Secondary | ICD-10-CM | POA: Diagnosis not present

## 2019-08-16 DIAGNOSIS — R627 Adult failure to thrive: Secondary | ICD-10-CM | POA: Diagnosis present

## 2019-08-16 DIAGNOSIS — K529 Noninfective gastroenteritis and colitis, unspecified: Secondary | ICD-10-CM | POA: Insufficient documentation

## 2019-08-16 DIAGNOSIS — Z79899 Other long term (current) drug therapy: Secondary | ICD-10-CM | POA: Diagnosis not present

## 2019-08-16 DIAGNOSIS — D696 Thrombocytopenia, unspecified: Secondary | ICD-10-CM | POA: Diagnosis not present

## 2019-08-16 DIAGNOSIS — C641 Malignant neoplasm of right kidney, except renal pelvis: Secondary | ICD-10-CM | POA: Diagnosis not present

## 2019-08-16 LAB — TSH: TSH: 34.104 u[IU]/mL — ABNORMAL HIGH (ref 0.308–3.960)

## 2019-08-16 LAB — CMP (CANCER CENTER ONLY)
ALT: 17 U/L (ref 0–44)
AST: 22 U/L (ref 15–41)
Albumin: 2.8 g/dL — ABNORMAL LOW (ref 3.5–5.0)
Alkaline Phosphatase: 74 U/L (ref 38–126)
Anion gap: 8 (ref 5–15)
BUN: 16 mg/dL (ref 8–23)
CO2: 21 mmol/L — ABNORMAL LOW (ref 22–32)
Calcium: 9.3 mg/dL (ref 8.9–10.3)
Chloride: 106 mmol/L (ref 98–111)
Creatinine: 1.37 mg/dL — ABNORMAL HIGH (ref 0.44–1.00)
GFR, Est AFR Am: 43 mL/min — ABNORMAL LOW (ref >60)
GFR, Estimated: 37 mL/min — ABNORMAL LOW (ref >60)
Glucose, Bld: 94 mg/dL (ref 70–99)
Potassium: 4.1 mmol/L (ref 3.5–5.1)
Sodium: 135 mmol/L (ref 135–145)
Total Bilirubin: 1.1 mg/dL (ref 0.3–1.2)
Total Protein: 5.7 g/dL — ABNORMAL LOW (ref 6.5–8.1)

## 2019-08-16 LAB — CBC WITH DIFFERENTIAL (CANCER CENTER ONLY)
Abs Immature Granulocytes: 0.02 10*3/uL (ref 0.00–0.07)
Basophils Absolute: 0.1 10*3/uL (ref 0.0–0.1)
Basophils Relative: 1 %
Eosinophils Absolute: 0.1 10*3/uL (ref 0.0–0.5)
Eosinophils Relative: 1 %
HCT: 40.1 % (ref 36.0–46.0)
Hemoglobin: 13.2 g/dL (ref 12.0–15.0)
Immature Granulocytes: 0 %
Lymphocytes Relative: 28 %
Lymphs Abs: 2 10*3/uL (ref 0.7–4.0)
MCH: 28.8 pg (ref 26.0–34.0)
MCHC: 32.9 g/dL (ref 30.0–36.0)
MCV: 87.6 fL (ref 80.0–100.0)
Monocytes Absolute: 0.7 10*3/uL (ref 0.1–1.0)
Monocytes Relative: 10 %
Neutro Abs: 4.3 10*3/uL (ref 1.7–7.7)
Neutrophils Relative %: 60 %
Platelet Count: 72 10*3/uL — ABNORMAL LOW (ref 150–400)
RBC: 4.58 MIL/uL (ref 3.87–5.11)
RDW: 20.1 % — ABNORMAL HIGH (ref 11.5–15.5)
WBC Count: 7.2 10*3/uL (ref 4.0–10.5)
nRBC: 0 % (ref 0.0–0.2)

## 2019-08-16 MED ORDER — HEPARIN SOD (PORK) LOCK FLUSH 100 UNIT/ML IV SOLN
500.0000 [IU] | Freq: Once | INTRAVENOUS | Status: AC | PRN
  Administered 2019-08-16: 500 [IU]
  Filled 2019-08-16: qty 5

## 2019-08-16 MED ORDER — SODIUM CHLORIDE 0.9% FLUSH
10.0000 mL | Freq: Once | INTRAVENOUS | Status: AC
  Administered 2019-08-16: 10 mL
  Filled 2019-08-16: qty 10

## 2019-08-16 MED ORDER — SODIUM CHLORIDE 0.9 % IV SOLN
Freq: Once | INTRAVENOUS | Status: AC
  Filled 2019-08-16: qty 250

## 2019-08-16 MED ORDER — SODIUM CHLORIDE 0.9% FLUSH
10.0000 mL | Freq: Once | INTRAVENOUS | Status: AC | PRN
  Administered 2019-08-16: 10 mL
  Filled 2019-08-16: qty 10

## 2019-08-16 NOTE — Progress Notes (Signed)
Hematology and Oncology Follow Up Visit  [name] [id number] 1940-05-18 79 y.o. 08/16/2019 12:04 PM [name], MDBadger, Mindy Alert, MD   Principle Diagnosis: 79 year old woman kidney cancer diagnosed in 2019. 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   Prior Therapy: She underwent right laparoscopic radical nephrectomy completed by Dr. Alinda [name] on December 28, 2017.  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  The final pathological stage was T1b.     Current therapy: Pembrolizumab 200 mg every 3 weeks started on April 25, 2019 with axitinib 5 mg daily.  She is here for cycle 6 of therapy.  Interim History: [name] is here for return evaluation. Since the last visit, she has reported overall fatigue and tiredness and failure to thrive.  She has decreased appetite and has lost close to 5 pounds.  She denies any nausea vomiting or abdominal pain but did report loose bowel movement especially after her CT scan.  She is reporting 1-2 loose bowel habits a day.  Her appetite has decreased and overall her mobility has been limited because of generalized fatigue.       Medications: Reviewed without changes. Current Outpatient Medications  Medication Sig Dispense Refill  . acetaminophen (TYLENOL) 500 MG tablet Take 1,000 mg by mouth every 6 (six) hours as needed for moderate pain or headache.     . ALPRAZolam (XANAX) 0.25 MG tablet Take 0.25 mg by mouth 3 (three) times daily as needed for anxiety.  0  . amLODipine (NORVASC) 10 MG tablet Take 1 tablet (10 mg total) by mouth daily. 90 tablet 1  . aspirin EC 81 MG tablet Take 81 mg by mouth at bedtime.     . calcium elemental as carbonate (BARIATRIC TUMS ULTRA) 400 MG chewable tablet Chew 1,000 mg by mouth 3 (three) times daily as needed for heartburn.    . clobetasol ointment (TEMOVATE) 2.44 % Apply 1  application topically 2 (two) times daily as needed (Rash).     . diclofenac Sodium (VOLTAREN) 1 % GEL Apply 4 g topically 3 (three) times daily as needed (Right knee pain).     [name] FLUoxetine (PROZAC) 40 MG capsule Take 40 mg by mouth every evening.    . furosemide (LASIX) 20 MG tablet Take 20 mg by mouth daily as needed for edema.    . INLYTA 5 MG tablet TAKE 1 TABLET (5 MG TOTAL) BY MOUTH 2 (TWO) TIMES DAILY. (Patient taking differently: Take 5 mg by mouth once. Ppt taking 1 tablet daily) 60 tablet 0  . irbesartan (AVAPRO) 300 MG tablet TAKE 1 TABLET(300 MG) BY MOUTH DAILY 90 tablet 1  . levothyroxine (SYNTHROID) 75 MCG tablet TAKE 1 TABLET(75 MCG) BY MOUTH DAILY AT 6 AM 90 tablet 3  . loperamide (IMODIUM) 2 MG capsule Take 2-4 mg by mouth 4 (four) times daily as needed for diarrhea or loose stools.     . magic mouthwash SOLN Take 5 mLs by mouth.    . magic mouthwash w/lidocaine SOLN Take 5 mLs by mouth 4 (four) times daily as needed for mouth pain. Swish and spit 240 mL 0  . Nebivolol HCl (BYSTOLIC) 20 MG TABS Take 1 tablet (20 mg total) by mouth daily. 30 tablet 11  . nitroGLYCERIN (NITROSTAT) 0.4 MG SL tablet Place 0.4 mg under the tongue every 5 (five) minutes as needed for chest pain (MAX 3 TABLETS).    [name]  nystatin (MYCOSTATIN/NYSTOP) powder Apply 1 application topically 3 (three) times daily.     . pantoprazole (PROTONIX) 40 MG tablet TAKE 1 TABLET BY MOUTH EVERY DAY 90 tablet 0  . PROAIR HFA 108 (90 Base) MCG/ACT inhaler Inhale 2 puffs into the lungs every 4 (four) hours as needed for shortness of breath or wheezing.  2  . Probiotic Product (PROBIOTIC PO) Take 1 capsule by mouth daily.    . prochlorperazine (COMPAZINE) 10 MG tablet Take 1 tablet (10 mg total) by mouth every 6 (six) hours as needed for nausea or vomiting. 30 tablet 3  . simethicone (MYLICON) 175 MG chewable tablet Chew 125 mg by mouth every 6 (six) hours as needed for flatulence.    . simvastatin (ZOCOR) 40 MG tablet Take 1  tablet (40 mg total) by mouth at bedtime. 90 tablet 3  . SYMBICORT 160-4.5 MCG/ACT inhaler Inhale 1 puff into the lungs 2 (two) times daily as needed (for respiratory issues.).   2   No current facility-administered medications for this visit.     Allergies:  Allergies  Allergen Reactions  . Chantix [Varenicline Tartrate] Hives  . Dipyridamole Hives and Rash      Physical Exam:  Blood pressure 99/65, pulse 69, temperature (!) 97.5 F (36.4 C), temperature source Temporal, resp. rate 17, height 5' 7.5" (1.715 m), weight 198 lb 4.8 oz (89.9 kg), SpO2 99 %.     ECOG: 1    General appearance: Comfortable appearing without any discomfort Head: Normocephalic without any trauma Oropharynx: Mucous membranes are moist and pink without any thrush or ulcers. Eyes: Pupils are equal and round reactive to light. Lymph nodes: No cervical, supraclavicular, inguinal or axillary lymphadenopathy.   Heart:regular rate and rhythm.  S1 and S2 without leg edema. Lung: Clear without any rhonchi or wheezes.  No dullness to percussion. Abdomin: Soft, nontender, nondistended with good bowel sounds.  No hepatosplenomegaly. Musculoskeletal: No joint deformity or effusion.  Full range of motion noted. Neurological: No deficits noted on motor, sensory and deep tendon reflex exam. Skin: No petechial rash or dryness.  Appeared moist.         Lab Results: Lab Results  Component Value Date   WBC 8.6 07/26/2019   HGB 12.9 07/26/2019   HCT 39.9 07/26/2019   MCV 86.4 07/26/2019   PLT 141 (L) 07/26/2019     Chemistry      Component Value Date/Time   NA 135 07/26/2019 0938   NA 139 03/31/2017 1133   K 4.4 07/26/2019 0938   CL 105 07/26/2019 0938   CO2 20 (L) 07/26/2019 0938   BUN 18 07/26/2019 0938   BUN 17 03/31/2017 1133   CREATININE 1.22 (H) 07/26/2019 0938   CREATININE 0.62 09/26/2014 1043      Component Value Date/Time   CALCIUM 9.1 07/26/2019 0938   ALKPHOS 75 07/26/2019 0938    AST 17 07/26/2019 0938   ALT 13 07/26/2019 0938   BILITOT 0.8 07/26/2019 0938     EXAM: CT CHEST, ABDOMEN AND PELVIS WITHOUT CONTRAST  TECHNIQUE: Multidetector CT imaging of the chest, abdomen and pelvis was performed following the standard protocol without IV contrast.  COMPARISON:  Chest CTA on 06/03/2019 and AP CT on 03/26/2019  FINDINGS: CT CHEST FINDINGS  Cardiovascular: No acute findings. Previous aortic valve replacement. Aortic and coronary artery atherosclerosis noted.  Mediastinum/Lymph Nodes: Mild lymphadenopathy in left lateral aortic region shows mild increase since previous study, with index lymph node measuring 13 mm on image  22/2, compared to 8 mm previously. Mild precarinal lymphadenopathy remains stable.  Lungs/Pleura: Pulmonary nodule in the posterior left lower lobe shows further decrease in size, now measuring 8 mm on image 83/4, compared to 13 mm previously. No new or enlarging pulmonary nodules or masses are identified. Bibasilar predominant subpleural interstitial fibrosis is stable in appearance and there is no evidence of acute infiltrate or pleural effusion.  Musculoskeletal:  No suspicious bone lesions identified.  CT ABDOMEN AND PELVIS FINDINGS  Hepatobiliary: No masses visualized on this unenhanced exam. Prior cholecystectomy. No evidence of biliary obstruction.  Pancreas: No mass or inflammatory changes identified on this unenhanced exam.  Spleen:  Within normal limits in size.  Adrenals/Urinary Tract: Normal appearance of both adrenal glands. Previous right nephrectomy again noted. No evidence of left renal calculi or mass. No evidence of ureteral calculi or hydronephrosis. Unremarkable appearance of bladder.  Stomach/Bowel: Postop changes again seen from previous gastric bypass surgery. No evidence of obstruction, abnormal fluid collections. Diverticulosis is seen mainly involving the sigmoid colon, however there is no  evidence of diverticulitis. New wall thickening is seen involving the ascending colon, consistent with colitis.  Vascular/Lymphatic: No pathologically enlarged lymph nodes identified. Stable infrarenal abdominal aortic aneurysm measuring 3.4 cm in maximum diameter.  Reproductive:  No masses or other significant abnormality.  Other:  None.  Musculoskeletal:  No suspicious bone lesions identified.  IMPRESSION: Slight increase in mild mediastinal lymphadenopathy in lateral aortic region.  Decreased size of left lower lobe pulmonary nodule.  Previous right nephrectomy. No evidence of recurrent or metastatic carcinoma within the abdomen or pelvis.  New right-sided colitis, likely infectious in etiology.  Mild sigmoid diverticulosis, without radiographic evidence of diverticulitis.  Stable 3.4 cm infrarenal abdominal aortic aneurysm. Recommend follow-up every 3 years. This recommendation follows ACR consensus guidelines: White Paper of the ACR Incidental Findings Committee II on Vascular Findings. J Am Coll Radiol 2013; 10:789-794. This recommendation follows ACR consensus guidelines: White Paper of the ACR Incidental Findings Committee II on Vascular Findings. J Am Coll Radiol 2013; 10:789-794.   Impression and Plan:  79 year old woman with:  1.   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    She continues to receive therapy with Pembrolizumab and axitinib without any major complications. Imaging studies obtained on 08/15/2019 were personally reviewed and showed positive response to therapy. Her pulmonary nodule has decreased in size down to 8 mm. She did have slight increase in her mediastinal adenopathy but no new areas of metastasis noted.  Risks and benefits of continuing the current treatment and alternative options were discussed. Continuing with Pembrolizumab alone versus axitinib alone  versus continuing the combination were discussed.  After discussion today, given her overall generalized fatigue and weakness and excellent response to therapy I have recommended holding treatment for the time being.  I will reevaluate her in 3 weeks and consider restarting treatment at that time.  She will not receive Pembrolizumab today and I asked her to stop axitinib for the time being.  2.  IV access: Port-A-Cath continues to be in use without any complications.  3.  Antiemetics: No nausea or vomiting reported at this time. Antiemetics are available to her.  4.  Goals of care and prognosis: Her disease is incurable although aggressive measures are warranted at this time.  5.  Immune mediated complications: Potential complications such as pneumonitis, thyroid disease as well as hepatitis were discussed. There is radiographic evidence of colitis  6.  Hypertension: Her blood pressure is low today likely related to poor p.o. intake.  He will receive intravenous hydration gently today.  7.  Hypothyroidism: TSH continues to improve at this time with thyroid supplement.  8.  Follow-up: She will return in 3 weeks for repeat evaluation.    30 minutes were spent on this encounter. The time was dedicated to updating her disease status, reviewing imaging studies, discussing treatment options and future plan of care evaluation.    Zola Button, MD 8/6/202112:04 PM

## 2019-08-16 NOTE — Progress Notes (Signed)
Per Dr.Shadad, pt will not receive treatment today, only IV fluids 1L over 2hrs. Infusion nurse Roxanne Gates made aware.

## 2019-08-16 NOTE — Patient Instructions (Signed)

## 2019-08-16 NOTE — Patient Instructions (Signed)

## 2019-08-21 ENCOUNTER — Encounter: Payer: Self-pay | Admitting: Internal Medicine

## 2019-09-03 ENCOUNTER — Other Ambulatory Visit: Payer: Self-pay

## 2019-09-03 ENCOUNTER — Other Ambulatory Visit (HOSPITAL_COMMUNITY): Payer: Self-pay | Admitting: Cardiology

## 2019-09-03 ENCOUNTER — Ambulatory Visit (HOSPITAL_COMMUNITY)
Admission: RE | Admit: 2019-09-03 | Discharge: 2019-09-03 | Disposition: A | Discharge status: Home / Self Care | Payer: Medicare Other | Source: Ambulatory Visit | Attending: Cardiovascular Disease | Admitting: Cardiovascular Disease

## 2019-09-03 ENCOUNTER — Ambulatory Visit (HOSPITAL_BASED_OUTPATIENT_CLINIC_OR_DEPARTMENT_OTHER)
Admission: RE | Admit: 2019-09-03 | Discharge: 2019-09-03 | Disposition: A | Discharge status: Home / Self Care | Payer: Medicare Other | Source: Ambulatory Visit | Attending: Cardiology | Admitting: Cardiology

## 2019-09-03 DIAGNOSIS — I739 Peripheral vascular disease, unspecified: Secondary | ICD-10-CM

## 2019-09-03 DIAGNOSIS — I714 Abdominal aortic aneurysm, without rupture, unspecified: Secondary | ICD-10-CM

## 2019-09-03 DIAGNOSIS — I745 Embolism and thrombosis of iliac artery: Secondary | ICD-10-CM

## 2019-09-06 ENCOUNTER — Inpatient Hospital Stay: Payer: Medicare Other

## 2019-09-06 ENCOUNTER — Other Ambulatory Visit: Payer: Self-pay

## 2019-09-06 ENCOUNTER — Inpatient Hospital Stay (HOSPITAL_BASED_OUTPATIENT_CLINIC_OR_DEPARTMENT_OTHER): Payer: Medicare Other | Admitting: Oncology

## 2019-09-06 VITALS — BP 94/62 | HR 69 | Temp 97.5°F | Resp 17 | Ht 67.5 in | Wt 198.8 lb

## 2019-09-06 DIAGNOSIS — C641 Malignant neoplasm of right kidney, except renal pelvis: Secondary | ICD-10-CM

## 2019-09-06 DIAGNOSIS — Z95828 Presence of other vascular implants and grafts: Secondary | ICD-10-CM

## 2019-09-06 DIAGNOSIS — C649 Malignant neoplasm of unspecified kidney, except renal pelvis: Secondary | ICD-10-CM

## 2019-09-06 LAB — CMP (CANCER CENTER ONLY)
ALT: 37 U/L (ref 0–44)
AST: 31 U/L (ref 15–41)
Albumin: 2.5 g/dL — ABNORMAL LOW (ref 3.5–5.0)
Alkaline Phosphatase: 65 U/L (ref 38–126)
Anion gap: 8 (ref 5–15)
BUN: 26 mg/dL — ABNORMAL HIGH (ref 8–23)
CO2: 22 mmol/L (ref 22–32)
Calcium: 8.7 mg/dL — ABNORMAL LOW (ref 8.9–10.3)
Chloride: 105 mmol/L (ref 98–111)
Creatinine: 1.51 mg/dL — ABNORMAL HIGH (ref 0.44–1.00)
GFR, Est AFR Am: 38 mL/min — ABNORMAL LOW (ref >60)
GFR, Estimated: 33 mL/min — ABNORMAL LOW (ref >60)
Glucose, Bld: 88 mg/dL (ref 70–99)
Potassium: 4.3 mmol/L (ref 3.5–5.1)
Sodium: 135 mmol/L (ref 135–145)
Total Bilirubin: 1.2 mg/dL (ref 0.3–1.2)
Total Protein: 5.3 g/dL — ABNORMAL LOW (ref 6.5–8.1)

## 2019-09-06 LAB — CBC WITH DIFFERENTIAL (CANCER CENTER ONLY)
Abs Immature Granulocytes: 0.02 10*3/uL (ref 0.00–0.07)
Basophils Absolute: 0.1 10*3/uL (ref 0.0–0.1)
Basophils Relative: 1 %
Eosinophils Absolute: 0.2 10*3/uL (ref 0.0–0.5)
Eosinophils Relative: 2 %
HCT: 35.7 % — ABNORMAL LOW (ref 36.0–46.0)
Hemoglobin: 12.1 g/dL (ref 12.0–15.0)
Immature Granulocytes: 0 %
Lymphocytes Relative: 34 %
Lymphs Abs: 2.9 10*3/uL (ref 0.7–4.0)
MCH: 30.8 pg (ref 26.0–34.0)
MCHC: 33.9 g/dL (ref 30.0–36.0)
MCV: 90.8 fL (ref 80.0–100.0)
Monocytes Absolute: 0.9 10*3/uL (ref 0.1–1.0)
Monocytes Relative: 10 %
Neutro Abs: 4.4 10*3/uL (ref 1.7–7.7)
Neutrophils Relative %: 53 %
Platelet Count: 74 10*3/uL — ABNORMAL LOW (ref 150–400)
RBC: 3.93 MIL/uL (ref 3.87–5.11)
RDW: 20.4 % — ABNORMAL HIGH (ref 11.5–15.5)
WBC Count: 8.4 10*3/uL (ref 4.0–10.5)
nRBC: 0 % (ref 0.0–0.2)

## 2019-09-06 LAB — TSH: TSH: 21.584 u[IU]/mL — ABNORMAL HIGH (ref 0.308–3.960)

## 2019-09-06 MED ORDER — LEVOTHYROXINE SODIUM 100 MCG PO TABS: 100.0000 ug | ORAL_TABLET | Freq: Every day | ORAL | 3 refills | Status: AC

## 2019-09-06 MED ORDER — SODIUM CHLORIDE 0.9% FLUSH
10.0000 mL | Freq: Once | INTRAVENOUS | Status: AC
  Administered 2019-09-06: 10 mL
  Filled 2019-09-06: qty 10

## 2019-09-06 MED ORDER — HEPARIN SOD (PORK) LOCK FLUSH 100 UNIT/ML IV SOLN
500.0000 [IU] | Freq: Once | INTRAVENOUS | Status: AC
  Administered 2019-09-06: 500 [IU]
  Filled 2019-09-06: qty 5

## 2019-09-06 NOTE — Patient Instructions (Signed)

## 2019-09-06 NOTE — Progress Notes (Signed)
Hematology and Oncology Follow Up Visit  [name] [id number] [birth year] 79 y.o. 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  She presented with localized disease in 2019.    Prior Therapy: She underwent right laparoscopic radical nephrectomy completed by Dr. Alinda Money on December 28, 2017.  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  The final pathological stage was T1b.     Current therapy: Pembrolizumab 200 mg every 3 weeks started on April 25, 2019 with axitinib 5 mg daily.  She is here for cycle 7 of therapy.  Interim History: [name] presents today for a follow-up visit.  Since last visit, she reports no major changes in her health.  She continues to be quite fatigued and tired overall.  She denies any nausea, vomiting or abdominal pain.  She denies any worsening diarrhea.  She denies shortness of breath or difficulty breathing.  Her performance status quality of life remains unchanged.       Medications: Updated on review. Current Outpatient Medications  Medication Sig Dispense Refill  . acetaminophen (TYLENOL) 500 MG tablet Take 1,000 mg by mouth every 6 (six) hours as needed for moderate pain or headache.     . ALPRAZolam (XANAX) 0.25 MG tablet Take 0.25 mg by mouth 3 (three) times daily as needed for anxiety.  0  . amLODipine (NORVASC) 10 MG tablet Take 1 tablet (10 mg total) by mouth daily. 90 tablet 1  . aspirin EC 81 MG tablet Take 81 mg by mouth at bedtime.     . calcium elemental as carbonate (BARIATRIC TUMS ULTRA) 400 MG chewable tablet Chew 1,000 mg by mouth 3 (three) times daily as needed for heartburn.    . clobetasol ointment (TEMOVATE) 1.85 % Apply 1 application topically 2 (two) times daily as needed (Rash).     . diclofenac Sodium (VOLTAREN) 1 %  GEL Apply 4 g topically 3 (three) times daily as needed (Right knee pain).     [name] FLUoxetine (PROZAC) 40 MG capsule Take 40 mg by mouth every evening.    . furosemide (LASIX) 20 MG tablet Take 20 mg by mouth daily as needed for edema.    . INLYTA 5 MG tablet TAKE 1 TABLET (5 MG TOTAL) BY MOUTH 2 (TWO) TIMES DAILY. (Patient taking differently: Take 5 mg by mouth once. Ppt taking 1 tablet daily) 60 tablet 0  . irbesartan (AVAPRO) 300 MG tablet TAKE 1 TABLET(300 MG) BY MOUTH DAILY 90 tablet 1  . levothyroxine (SYNTHROID) 75 MCG tablet TAKE 1 TABLET(75 MCG) BY MOUTH DAILY AT 6 AM 90 tablet 3  . loperamide (IMODIUM) 2 MG capsule Take 2-4 mg by mouth 4 (four) times daily as needed for diarrhea or loose stools.     . magic mouthwash SOLN Take 5 mLs by mouth.    . magic mouthwash w/lidocaine SOLN Take 5 mLs by mouth 4 (four) times daily as needed for mouth pain. Swish and spit 240 mL 0  . Nebivolol HCl (BYSTOLIC) 20 MG TABS Take 1 tablet (20 mg total) by mouth daily. 30 tablet 11  . nitroGLYCERIN (NITROSTAT) 0.4 MG SL tablet Place 0.4 mg under the tongue every 5 (five) minutes as needed for chest pain (MAX 3 TABLETS).    [name] nystatin (MYCOSTATIN/NYSTOP) powder Apply 1 application topically 3 (three) times  daily.     . pantoprazole (PROTONIX) 40 MG tablet TAKE 1 TABLET BY MOUTH EVERY DAY 90 tablet 0  . PROAIR HFA 108 (90 Base) MCG/ACT inhaler Inhale 2 puffs into the lungs every 4 (four) hours as needed for shortness of breath or wheezing.  2  . Probiotic Product (PROBIOTIC PO) Take 1 capsule by mouth daily.    . prochlorperazine (COMPAZINE) 10 MG tablet Take 1 tablet (10 mg total) by mouth every 6 (six) hours as needed for nausea or vomiting. 30 tablet 3  . simethicone (MYLICON) 759 MG chewable tablet Chew 125 mg by mouth every 6 (six) hours as needed for flatulence.    . simvastatin (ZOCOR) 40 MG tablet Take 1 tablet (40 mg total) by mouth at bedtime. 90 tablet 3  . SYMBICORT 160-4.5 MCG/ACT inhaler Inhale 1  puff into the lungs 2 (two) times daily as needed (for respiratory issues.).   2   No current facility-administered medications for this visit.   Facility-Administered Medications Ordered in Other Visits  Medication Dose Route Frequency Provider Last Rate Last Admin  . sodium chloride flush (NS) 0.9 % injection 10 mL  10 mL Intracatheter Once Wyatt Portela, MD         Allergies:  Allergies  Allergen Reactions  . Chantix [Varenicline Tartrate] Hives  . Dipyridamole Hives and Rash      Physical Exam:   Blood pressure 94/62, pulse 69, temperature (!) 97.5 F (36.4 C), temperature source Tympanic, resp. rate 17, height 5' 7.5" (1.715 m), weight 198 lb 12.8 oz (90.2 kg), SpO2 99 %.     ECOG: 1     General appearance: Alert, awake without any distress. Head: Atraumatic without abnormalities Oropharynx: Without any thrush or ulcers. Eyes: No scleral icterus. Lymph nodes: No lymphadenopathy noted in the cervical, supraclavicular, or axillary nodes Heart:regular rate and rhythm, without any murmurs or gallops.   Lung: Clear to auscultation without any rhonchi, wheezes or dullness to percussion. Abdomin: Soft, nontender without any shifting dullness or ascites. Musculoskeletal: No clubbing or cyanosis. Neurological: No motor or sensory deficits. Skin: No rashes or lesions.          Lab Results: Lab Results  Component Value Date   WBC 7.2 08/16/2019   HGB 13.2 08/16/2019   HCT 40.1 08/16/2019   MCV 87.6 08/16/2019   PLT 72 (L) 08/16/2019     Chemistry      Component Value Date/Time   NA 135 08/16/2019 1147   NA 139 03/31/2017 1133   K 4.1 08/16/2019 1147   CL 106 08/16/2019 1147   CO2 21 (L) 08/16/2019 1147   BUN 16 08/16/2019 1147   BUN 17 03/31/2017 1133   CREATININE 1.37 (H) 08/16/2019 1147   CREATININE 0.62 09/26/2014 1043      Component Value Date/Time   CALCIUM 9.3 08/16/2019 1147   ALKPHOS 74 08/16/2019 1147   AST 22 08/16/2019 1147   ALT 17  08/16/2019 1147   BILITOT 1.1 08/16/2019 1147      IMPRESSION: Slight increase in mild mediastinal lymphadenopathy in lateral aortic region.  Decreased size of left lower lobe pulmonary nodule.  Previous right nephrectomy. No evidence of recurrent or metastatic carcinoma within the abdomen or pelvis.  New right-sided colitis, likely infectious in etiology.  Mild sigmoid diverticulosis, without radiographic evidence of diverticulitis.  Stable 3.4 cm infrarenal abdominal aortic aneurysm. Recommend follow-up every 3 years. This recommendation follows ACR consensus guidelines: White Paper of the ACR Incidental Findings  Committee II on Vascular Findings. J Am Coll Radiol 2013; 10:789-794. This recommendation follows ACR consensus guidelines: White Paper of the ACR Incidental Findings Committee II on Vascular Findings. J Am Coll Radiol 2013; 10:789-794.   Impression and Plan:  79 year old woman with:  1.    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   The natural course of her disease was reviewed and treatment options moving forward were reiterated.  She has been on treatment break for the last 6 weeks after imaging studies showed reasonable response to therapy.  Risks and benefits of restarting treatment were discussed today.  Single agent Pembrolizumab could be considered versus axitinib alone.  After discussion today, we will continue to hold Pembrolizumab given her symptoms and overall stable disease.  We will reevaluate the next 3 to 4 weeks.  2.  IV access: Port-A-Cath remains in use without any issues.  3.  Antiemetics: Compazine is available to her without any nausea or vomiting.  4.  Goals of care and prognosis: Therapy remains palliative although aggressive measures are warranted given her reasonable performance status.  5.  Immune mediated complications: Complications such as pneumonitis, colitis and thyroid  disease were reiterated.  6.  Hypertension: Blood pressure is close to normal range at this time.  7.  Hypothyroidism: He is currently on thyroid supplements with TSH continues to be elevated.  I will increase her Synthroid and reassess in 3 to 4 weeks.  8.  Thrombocytopenia: Her platelet count has declined to 72 likely related to axitinib therapy.  Platelet count continues to be stable.  9.  Follow-up: She will return in the next 3 to 4 weeks for repeat evaluation.    30 minutes were dedicated to this visit.  The time was spent on reviewing her disease status, discussing treatment options addressing complications related to therapy.    Zola Button, MD 8/27/20219:11 AM

## 2019-09-06 NOTE — Addendum Note (Signed)
Addended by: Wyatt Portela on: 09/06/2019 10:57 AM   Modules accepted: Orders

## 2019-09-10 ENCOUNTER — Telehealth: Payer: Self-pay | Admitting: Cardiology

## 2019-09-10 NOTE — Telephone Encounter (Signed)
Patient made aware of AAA and LEA results, verbalized understanding.

## 2019-09-10 NOTE — Telephone Encounter (Signed)
Patient is returning call to discuss results from AAA Duplex completed on 09/06/19.

## 2019-09-18 ENCOUNTER — Other Ambulatory Visit: Payer: Self-pay

## 2019-09-18 DIAGNOSIS — I739 Peripheral vascular disease, unspecified: Secondary | ICD-10-CM

## 2019-09-18 DIAGNOSIS — I714 Abdominal aortic aneurysm, without rupture, unspecified: Secondary | ICD-10-CM

## 2019-09-18 NOTE — Progress Notes (Signed)
vas 

## 2019-09-25 ENCOUNTER — Telehealth: Payer: Self-pay | Admitting: Oncology

## 2019-09-25 NOTE — Telephone Encounter (Signed)
Scheduled per 08/27 los, patient has been called and notified.

## 2019-09-30 ENCOUNTER — Other Ambulatory Visit: Payer: Self-pay | Admitting: Gastroenterology

## 2019-09-30 ENCOUNTER — Encounter: Payer: Self-pay | Admitting: Internal Medicine

## 2019-09-30 NOTE — Telephone Encounter (Signed)
Sent letter stating she needed office visit

## 2019-09-30 NOTE — Telephone Encounter (Signed)
I have refilled X 1. Needs office visit before any additional refills.

## 2019-10-11 ENCOUNTER — Inpatient Hospital Stay (HOSPITAL_BASED_OUTPATIENT_CLINIC_OR_DEPARTMENT_OTHER): Payer: Medicare Other | Admitting: Oncology

## 2019-10-11 ENCOUNTER — Inpatient Hospital Stay: Payer: Medicare Other | Admitting: Nutrition

## 2019-10-11 ENCOUNTER — Inpatient Hospital Stay: Payer: Medicare Other

## 2019-10-11 ENCOUNTER — Inpatient Hospital Stay: Payer: Medicare Other | Attending: Oncology

## 2019-10-11 ENCOUNTER — Other Ambulatory Visit: Payer: Self-pay

## 2019-10-11 VITALS — BP 108/76 | HR 77 | Temp 97.0°F | Resp 17 | Ht 67.5 in | Wt 190.6 lb

## 2019-10-11 DIAGNOSIS — Z5189 Encounter for other specified aftercare: Secondary | ICD-10-CM | POA: Insufficient documentation

## 2019-10-11 DIAGNOSIS — C649 Malignant neoplasm of unspecified kidney, except renal pelvis: Secondary | ICD-10-CM

## 2019-10-11 DIAGNOSIS — C7800 Secondary malignant neoplasm of unspecified lung: Secondary | ICD-10-CM | POA: Insufficient documentation

## 2019-10-11 DIAGNOSIS — Z79899 Other long term (current) drug therapy: Secondary | ICD-10-CM | POA: Insufficient documentation

## 2019-10-11 DIAGNOSIS — Z95828 Presence of other vascular implants and grafts: Secondary | ICD-10-CM

## 2019-10-11 DIAGNOSIS — C641 Malignant neoplasm of right kidney, except renal pelvis: Secondary | ICD-10-CM

## 2019-10-11 DIAGNOSIS — Z5112 Encounter for antineoplastic immunotherapy: Secondary | ICD-10-CM | POA: Insufficient documentation

## 2019-10-11 LAB — CMP (CANCER CENTER ONLY)
ALT: 19 U/L (ref 0–44)
AST: 20 U/L (ref 15–41)
Albumin: 2.6 g/dL — ABNORMAL LOW (ref 3.5–5.0)
Alkaline Phosphatase: 55 U/L (ref 38–126)
Anion gap: 4 — ABNORMAL LOW (ref 5–15)
BUN: 15 mg/dL (ref 8–23)
CO2: 22 mmol/L (ref 22–32)
Calcium: 8.7 mg/dL — ABNORMAL LOW (ref 8.9–10.3)
Chloride: 108 mmol/L (ref 98–111)
Creatinine: 1.1 mg/dL — ABNORMAL HIGH (ref 0.44–1.00)
GFR, Est AFR Am: 56 mL/min — ABNORMAL LOW (ref >60)
GFR, Estimated: 48 mL/min — ABNORMAL LOW (ref >60)
Glucose, Bld: 96 mg/dL (ref 70–99)
Potassium: 3.7 mmol/L (ref 3.5–5.1)
Sodium: 134 mmol/L — ABNORMAL LOW (ref 135–145)
Total Bilirubin: 1.1 mg/dL (ref 0.3–1.2)
Total Protein: 5.5 g/dL — ABNORMAL LOW (ref 6.5–8.1)

## 2019-10-11 LAB — CBC WITH DIFFERENTIAL (CANCER CENTER ONLY)
Abs Immature Granulocytes: 0.04 10*3/uL (ref 0.00–0.07)
Basophils Absolute: 0.1 10*3/uL (ref 0.0–0.1)
Basophils Relative: 1 %
Eosinophils Absolute: 0 10*3/uL (ref 0.0–0.5)
Eosinophils Relative: 0 %
HCT: 36.3 % (ref 36.0–46.0)
Hemoglobin: 12.3 g/dL (ref 12.0–15.0)
Immature Granulocytes: 0 %
Lymphocytes Relative: 28 %
Lymphs Abs: 2.8 10*3/uL (ref 0.7–4.0)
MCH: 33.5 pg (ref 26.0–34.0)
MCHC: 33.9 g/dL (ref 30.0–36.0)
MCV: 98.9 fL (ref 80.0–100.0)
Monocytes Absolute: 0.8 10*3/uL (ref 0.1–1.0)
Monocytes Relative: 8 %
Neutro Abs: 6.1 10*3/uL (ref 1.7–7.7)
Neutrophils Relative %: 63 %
Platelet Count: 98 10*3/uL — ABNORMAL LOW (ref 150–400)
RBC: 3.67 MIL/uL — ABNORMAL LOW (ref 3.87–5.11)
RDW: 16.7 % — ABNORMAL HIGH (ref 11.5–15.5)
WBC Count: 9.8 10*3/uL (ref 4.0–10.5)
nRBC: 0 % (ref 0.0–0.2)

## 2019-10-11 LAB — TSH: TSH: 2.356 u[IU]/mL (ref 0.308–3.960)

## 2019-10-11 MED ORDER — SODIUM CHLORIDE 0.9% FLUSH
10.0000 mL | Freq: Once | INTRAVENOUS | Status: AC | PRN
  Administered 2019-10-11: 10 mL
  Filled 2019-10-11: qty 10

## 2019-10-11 MED ORDER — SODIUM CHLORIDE 0.9% FLUSH
10.0000 mL | INTRAVENOUS | Status: DC | PRN
  Administered 2019-10-11: 10 mL
  Filled 2019-10-11: qty 10

## 2019-10-11 MED ORDER — SODIUM CHLORIDE 0.9 % IV SOLN
Freq: Once | INTRAVENOUS | Status: AC
  Filled 2019-10-11: qty 250

## 2019-10-11 MED ORDER — SODIUM CHLORIDE 0.9 % IV SOLN
200.0000 mg | Freq: Once | INTRAVENOUS | Status: AC
  Administered 2019-10-11: 200 mg via INTRAVENOUS
  Filled 2019-10-11: qty 8

## 2019-10-11 MED ORDER — HEPARIN SOD (PORK) LOCK FLUSH 100 UNIT/ML IV SOLN
500.0000 [IU] | Freq: Once | INTRAVENOUS | Status: AC | PRN
  Administered 2019-10-11: 500 [IU]
  Filled 2019-10-11: qty 5

## 2019-10-11 NOTE — Progress Notes (Signed)
Nutrition follow-up completed with patient in the infusion room receiving Pembrolizumab for renal cell carcinoma. Weight decreased to 190.6 pounds October 1 decreased from 202.4 pounds June 23 and 198.8 pounds on August 27. Noted labs: Sodium 134, creatinine 1.1, albumin 2.6. Patient reports she is very weak and has increased fatigue. She has poor appetite and does not eat very much. She will drink a protein shake. She reports occasional diarrhea.  Nutrition diagnosis: Unintended weight loss continues.  Intervention: Educated patient on the importance of increasing calories and protein in smaller amounts of food throughout the day. Recommended adding 1-2 cartons of Ensure Plus or equivalent every day to help improve appetite and minimize further weight loss. Educated on strategies to improve diarrhea. Reviewed examples of high-calorie, high-protein snacks. I provided fact sheets.  Questions were answered.  Contact information given.  Monitoring, evaluation, goals: Patient will work to increase calories and protein to minimize further weight loss.  Next visit: To be scheduled as needed.  Patient has contact information for questions or concerns.  **Disclaimer: This note was dictated with voice recognition software. Similar sounding words can inadvertently be transcribed and this note may contain transcription errors which may not have been corrected upon publication of note.**

## 2019-10-11 NOTE — Patient Instructions (Signed)
[facility] Discharge Instructions for Patients Receiving Chemotherapy  Today you received the following chemotherapy agents:  Keytruda.  To help prevent nausea and vomiting after your treatment, we encourage you to take your nausea medication as directed.   If you develop nausea and vomiting that is not controlled by your nausea medication, call the clinic.   BELOW ARE SYMPTOMS THAT SHOULD BE REPORTED IMMEDIATELY:  *FEVER GREATER THAN 100.5 F  *CHILLS WITH OR WITHOUT FEVER  NAUSEA AND VOMITING THAT IS NOT CONTROLLED WITH YOUR NAUSEA MEDICATION  *UNUSUAL SHORTNESS OF BREATH  *UNUSUAL BRUISING OR BLEEDING  TENDERNESS IN MOUTH AND THROAT WITH OR WITHOUT PRESENCE OF ULCERS  *URINARY PROBLEMS  *BOWEL PROBLEMS  UNUSUAL RASH Items with * indicate a potential emergency and should be followed up as soon as possible.  Feel free to call the clinic should you have any questions or concerns. The clinic phone number is [phone].  Please show the CHEMO ALERT CARD at check-in to the Emergency Department and triage nurse.    

## 2019-10-11 NOTE — Progress Notes (Signed)
Per Dr. Alen Blew okay to treat with PLT 98.

## 2019-10-11 NOTE — Progress Notes (Signed)
Hematology and Oncology Follow Up Visit  [name] [id number] [birth year] 79 y.o. 10/11/2019 12:24 PM [name], MDBadger, [name] Alert, MD   Principle Diagnosis: 10 year old woman with kidney cancer diagnosed in 2019.  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    Prior Therapy: She underwent right laparoscopic radical nephrectomy completed by Dr. Alinda Money on December 28, 2017.  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  The final pathological stage was T1b.     Current therapy: Pembrolizumab 200 mg every 3 weeks started on April 25, 2019.  She completed 5 cycles of therapy with axitinib 5 mg daily with the last cycle given on July 26, 2019.  Therapy has been on hold because of side effects.  Interim History: Ms. [name] returns today for a repeat evaluation.  Since last visit, she reports improvement in her symptoms with increase in her energy and activity level.  She has not been taking axitinib since the last visit.  She denies any nausea, vomiting or abdominal pain.  She denies any worsening diarrhea or hematuria.  Her performance status is marginal but appeared stable overall.       Medications: Unchanged on review. Current Outpatient Medications  Medication Sig Dispense Refill  . acetaminophen (TYLENOL) 500 MG tablet Take 1,000 mg by mouth every 6 (six) hours as needed for moderate pain or headache.     . ALPRAZolam (XANAX) 0.25 MG tablet Take 0.25 mg by mouth 3 (three) times daily as needed for anxiety.  0  . amLODipine (NORVASC) 10 MG tablet Take 1 tablet (10 mg total) by mouth daily. 90 tablet 1  . aspirin EC 81 MG tablet Take 81 mg by mouth at bedtime.     . calcium elemental as carbonate (BARIATRIC TUMS ULTRA) 400 MG chewable tablet Chew 1,000 mg by mouth 3 (three) times daily as needed for heartburn.    . clobetasol ointment (TEMOVATE) 0.93 % Apply 1  application topically 2 (two) times daily as needed (Rash).     . diclofenac Sodium (VOLTAREN) 1 % GEL Apply 4 g topically 3 (three) times daily as needed (Right knee pain).     [name] FLUoxetine (PROZAC) 40 MG capsule Take 40 mg by mouth every evening.    . furosemide (LASIX) 20 MG tablet Take 20 mg by mouth daily as needed for edema.    . INLYTA 5 MG tablet TAKE 1 TABLET (5 MG TOTAL) BY MOUTH 2 (TWO) TIMES DAILY. (Patient taking differently: Take 5 mg by mouth once. Ppt taking 1 tablet daily) 60 tablet 0  . irbesartan (AVAPRO) 300 MG tablet TAKE 1 TABLET(300 MG) BY MOUTH DAILY 90 tablet 1  . levothyroxine (SYNTHROID) 100 MCG tablet Take 1 tablet (100 mcg total) by mouth daily before breakfast. 90 tablet 3  . loperamide (IMODIUM) 2 MG capsule Take 2-4 mg by mouth 4 (four) times daily as needed for diarrhea or loose stools.     . magic mouthwash SOLN Take 5 mLs by mouth.    . magic mouthwash w/lidocaine SOLN Take 5 mLs by mouth 4 (four) times daily as needed for mouth pain. Swish and spit 240 mL 0  . Nebivolol HCl (BYSTOLIC) 20 MG TABS Take 1 tablet (20 mg total) by mouth daily. 30 tablet 11  . nitroGLYCERIN (NITROSTAT) 0.4 MG SL tablet Place 0.4 mg under the tongue every 5 (five) minutes as needed for chest  pain (MAX 3 TABLETS).    [name] nystatin (MYCOSTATIN/NYSTOP) powder Apply 1 application topically 3 (three) times daily.     . pantoprazole (PROTONIX) 40 MG tablet TAKE 1 TABLET BY MOUTH EVERY DAY 90 tablet 0  . PROAIR HFA 108 (90 Base) MCG/ACT inhaler Inhale 2 puffs into the lungs every 4 (four) hours as needed for shortness of breath or wheezing.  2  . Probiotic Product (PROBIOTIC PO) Take 1 capsule by mouth daily.    . prochlorperazine (COMPAZINE) 10 MG tablet Take 1 tablet (10 mg total) by mouth every 6 (six) hours as needed for nausea or vomiting. 30 tablet 3  . simethicone (MYLICON) 093 MG chewable tablet Chew 125 mg by mouth every 6 (six) hours as needed for flatulence.    . simvastatin (ZOCOR) 40  MG tablet Take 1 tablet (40 mg total) by mouth at bedtime. 90 tablet 3  . SYMBICORT 160-4.5 MCG/ACT inhaler Inhale 1 puff into the lungs 2 (two) times daily as needed (for respiratory issues.).   2   No current facility-administered medications for this visit.     Allergies:  Allergies  Allergen Reactions  . Chantix [Varenicline Tartrate] Hives  . Dipyridamole Hives and Rash      Physical Exam:    Blood pressure 108/76, pulse 77, temperature (!) 97 F (36.1 C), temperature source Tympanic, resp. rate 17, height 5' 7.5" (1.715 m), weight 190 lb 9.6 oz (86.5 kg), SpO2 98 %.     ECOG: 1    General appearance: Comfortable appearing without any discomfort Head: Normocephalic without any trauma Oropharynx: Mucous membranes are moist and pink without any thrush or ulcers. Eyes: Pupils are equal and round reactive to light. Lymph nodes: No cervical, supraclavicular, inguinal or axillary lymphadenopathy.   Heart:regular rate and rhythm.  S1 and S2 without leg edema. Lung: Clear without any rhonchi or wheezes.  No dullness to percussion. Abdomin: Soft, nontender, nondistended with good bowel sounds.  No hepatosplenomegaly. Musculoskeletal: No joint deformity or effusion.  Full range of motion noted. Neurological: No deficits noted on motor, sensory and deep tendon reflex exam. Skin: No petechial rash or dryness.  Appeared moist.            Lab Results: Lab Results  Component Value Date   WBC 8.4 09/06/2019   HGB 12.1 09/06/2019   HCT 35.7 (L) 09/06/2019   MCV 90.8 09/06/2019   PLT 74 (L) 09/06/2019     Chemistry      Component Value Date/Time   NA 135 09/06/2019 0925   NA 139 03/31/2017 1133   K 4.3 09/06/2019 0925   CL 105 09/06/2019 0925   CO2 22 09/06/2019 0925   BUN 26 (H) 09/06/2019 0925   BUN 17 03/31/2017 1133   CREATININE 1.51 (H) 09/06/2019 0925   CREATININE 0.62 09/26/2014 1043      Component Value Date/Time   CALCIUM 8.7 (L) 09/06/2019 0925    ALKPHOS 65 09/06/2019 0925   AST 31 09/06/2019 0925   ALT 37 09/06/2019 0925   BILITOT 1.2 09/06/2019 0925         Impression and Plan:  79 year old woman with:  1.    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  She has stage IV disease with pulmonary involvement.  Her disease status was updated and she has received 5 cycles of Pembrolizumab and axitinib and currently on axitinib alone.  Risks and benefits of continuing this approach versus resuming Pembrolizumab were discussed.   After  discussion today, we opted to resume Pembrolizumab as a single agent and discontinue axitinib for the time being.  If she experience worsening symptoms in the future we will switch therapy altogether.  2.  IV access: Port-A-Cath will be flushed periodically.  3.  Antiemetics: No nausea or vomiting reported at this time.  Compazine is available to her.  4.  Goals of care and prognosis: Her disease is incurable and aggressive measures are warranted however given her reasonable performance status.   5.  Immune mediated complications: I continue to educate her about potential complications related pneumonitis, colitis and thyroid disease.  6.  Hypertension: Blood pressure is adequate at this time.  7.  Hypothyroidism: She is currently on Synthroid supplement with improvement in her TSH.  We will continue to monitor on repeat today.  8.  Thrombocytopenia: Related to cancer therapy without any active bleeding.  No intervention is needed at this time.  9.  Follow-up: Will be in 3 weeks for repeat evaluation.    30 minutes were spent on this encounter.  The time was dedicated to updating her disease status, discussing treatment options and addressing complication related to cancer and cancer therapy.    Zola Button, MD 10/1/202112:24 PM

## 2019-10-16 ENCOUNTER — Other Ambulatory Visit: Payer: Self-pay | Admitting: Oncology

## 2019-10-24 NOTE — Progress Notes (Deleted)
[name] Date of Birth: 09/25/1940   History of Present Illness: [name] is seen today for follow up CAD, AS, and PAD. She has known CAD with redo CABG in 2001.  She has a known abdominal aortic aneurysm with last ultrasound  showing a  3.1 cm aneurysm in August 2018. She has a history of PAD with right iliac occlusion. Last ABIs in August 2018 showed stable right ABI of 0.75.  She has chronic claudication symptoms with pain in right thigh with walking for long distances. This has not changed.[name] Her last nuclear stress test in January 2016 showed  anterior wall ischemia. This was unchanged from 2012 and 2013. She had a cardiac cath in 2008 which  showed patent grafts.   She has  lymphocytic colitis. Followed by Dr. Gala Romney. This flared last Spring and she was placed on Entecort. Following this she developed edema and elevated BP. She states she felt dizzy, HA, and "crazy". She was started on Avapro. Symptoms resolved after she came off steroids.   Over the past year or so she has experienced significant progression of symptoms of exertional shortness of breath and fatigue. Follow-up echocardiogram performed March 29, 2017 revealed normal left ventricular systolic function with severe aortic stenosis. Peak velocity across the aortic valve measured 4.1 m/s corresponding to mean transvalvular gradient estimated 39 mmHg. The DVI was reported 0.20 with aortic valve area calculated 0.69 cm. Left ventricular ejection fraction was estimated 55-60%. Cardiac cath performed 04/04/2017 revealed peak to peak gradient of 36 mmHg and a mean gradient of 32 mmHg and severe 3 vessel CAD with continued patency of LIMA to mid LAD, free RIMA to the distal LAD, and sequential SVG to the first and second OM of the Cx.   She underwent successful TAVR with a 26 mm Sapien 3 on 05/09/2017. Post operative echo showed a normally functioning TAVR with no significant PVL; mean gradient 11 mm Hg. She was discharged on ASA and  plavix.   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 She returned in early January with sepsis related to an incisional abdominal wall abscess. This was surgically drained and she was treated with antibiotics. Blood cultures were negative.   In March she had CT done showing significant adenopathy in her chest. 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 Getting ready to start therapy with axitnib and pembrolizumab. She notes SOB. No chest pain. Occasional swelling in left ankle. Uses lasix prn. No claudication. Still smokes some. Notes husband died in 03/22/2022 from complications of Covid 19 infection.   Admitted to the hospital 06/02/19-06/05/19 with acute encephalopathy, acute respiratory failure with hypoxemia and urinary tract infection.  She received IV antibiotics and IV hydration as well as supplemental oxygen.  On presentation her oxygen saturation was in the 80s and was believed to be related to acute on chronic diastolic CHF.  With IV diuresis in the emergency room her oxygenation improved.  Her acute encephalopathy was believed to be related to UTI.  She was noted to have mild AKI on top of her CKD stage III which improved with IV hydration.  She was discharged in stable condition and prescribed Levaquin.  Follow up LE arterial dopplers in August were unchanged. AAA was stable at 3.4 cm.   Current Outpatient Medications on File Prior to Visit  Medication Sig Dispense Refill  . acetaminophen (TYLENOL) 500 MG tablet Take 1,000 mg by mouth every 6 (  six) hours as needed for moderate pain or headache.     . ALPRAZolam (XANAX) 0.25 MG tablet Take 0.25 mg by mouth 3 (three) times daily as needed for anxiety.  0  . amLODipine (NORVASC) 10 MG tablet Take 1 tablet (10 mg total) by mouth daily. 90 tablet 1  . aspirin EC 81 MG tablet Take 81 mg by mouth at bedtime.     . calcium elemental as carbonate (BARIATRIC TUMS ULTRA) 400 MG chewable tablet  Chew 1,000 mg by mouth 3 (three) times daily as needed for heartburn.    . clobetasol ointment (TEMOVATE) 2.95 % Apply 1 application topically 2 (two) times daily as needed (Rash).     . diclofenac Sodium (VOLTAREN) 1 % GEL Apply 4 g topically 3 (three) times daily as needed (Right knee pain).     [name] FLUoxetine (PROZAC) 40 MG capsule Take 40 mg by mouth every evening.    . furosemide (LASIX) 20 MG tablet Take 20 mg by mouth daily as needed for edema.    . INLYTA 5 MG tablet TAKE 1 TABLET (5 MG TOTAL) BY MOUTH 2 (TWO) TIMES DAILY. (Patient taking differently: Take 5 mg by mouth once. Ppt taking 1 tablet daily) 60 tablet 0  . irbesartan (AVAPRO) 300 MG tablet TAKE 1 TABLET(300 MG) BY MOUTH DAILY 90 tablet 1  . levothyroxine (SYNTHROID) 100 MCG tablet Take 1 tablet (100 mcg total) by mouth daily before breakfast. 90 tablet 3  . loperamide (IMODIUM) 2 MG capsule Take 2-4 mg by mouth 4 (four) times daily as needed for diarrhea or loose stools.     . magic mouthwash SOLN Take 5 mLs by mouth.    . magic mouthwash w/lidocaine SOLN Take 5 mLs by mouth 4 (four) times daily as needed for mouth pain. Swish and spit 240 mL 0  . Nebivolol HCl (BYSTOLIC) 20 MG TABS Take 1 tablet (20 mg total) by mouth daily. 30 tablet 11  . nitroGLYCERIN (NITROSTAT) 0.4 MG SL tablet Place 0.4 mg under the tongue every 5 (five) minutes as needed for chest pain (MAX 3 TABLETS).    [name] nystatin (MYCOSTATIN/NYSTOP) powder Apply 1 application topically 3 (three) times daily.     . pantoprazole (PROTONIX) 40 MG tablet TAKE 1 TABLET BY MOUTH EVERY DAY 90 tablet 0  . PROAIR HFA 108 (90 Base) MCG/ACT inhaler Inhale 2 puffs into the lungs every 4 (four) hours as needed for shortness of breath or wheezing.  2  . Probiotic Product (PROBIOTIC PO) Take 1 capsule by mouth daily.    . prochlorperazine (COMPAZINE) 10 MG tablet TAKE 1 TABLET(10 MG) BY MOUTH EVERY 6 HOURS AS NEEDED FOR NAUSEA OR VOMITING 30 tablet 3  . simethicone (MYLICON) 284 MG  chewable tablet Chew 125 mg by mouth every 6 (six) hours as needed for flatulence.    . simvastatin (ZOCOR) 40 MG tablet Take 1 tablet (40 mg total) by mouth at bedtime. 90 tablet 3  . SYMBICORT 160-4.5 MCG/ACT inhaler Inhale 1 puff into the lungs 2 (two) times daily as needed (for respiratory issues.).   2   No current facility-administered medications on file prior to visit.    Allergies  Allergen Reactions  . Chantix [Varenicline Tartrate] Hives  . Dipyridamole Hives and Rash    Past Medical History:  Diagnosis Date  . Abdominal aortic aneurysm ([place])    infrarenal diagnosed with maximum measurement at 3.3 cm  . Cancer ([place])   . Coronary artery disease   .  Depression   . Diverticulosis   . GERD (gastroesophageal reflux disease)   . History of tobacco abuse    Recurrent cigarette smoking  . Hyperlipidemia   . Hyperplastic colon polyp   . Hypertension   . Hypothyroidism   . Ischemic heart disease    had redo bypass surgery in 2001, at that time she had a free right internal mammary to the LAD, LAD endarterectomy, sequential vein graft to the OM1 and OM2 -- last catheterization was in 2008  . Leg fracture    remote right leg fracture  . Microscopic colitis   . Obesity    with gastric bypass surgery in 2004  . PAD (peripheral artery disease) ([facility])    right iliac occlusion  . PONV (postoperative nausea and vomiting)   . Right kidney mass   . S/P CABG x 4 1985   LIMA to LAD, Sequential SVG to OM1-OM2, SVG to RCA - Dr Redmond Pulling  . S/P redo CABG x 3 2001   Free RIMA to LAD with endarterectomy, sequential SVG to OM1-OM2 - Dr Servando Snare  . 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 ARTERIAL LINE INSERTION Right 05/09/2017   Procedure: ARTERIAL LINE  INSERTION  RIGHT RADIAL ARTERY;  Surgeon: Sherren Mocha,  MD;  Location: [facility];  Service: Open Heart Surgery;  Laterality: Right;  . CARDIAC CATHETERIZATION  02/16/1999   Left heart catheterization with selective coronary  angiography, left ventricular angiography (RAO and LAO views), angiography of left internal mammary artery, and angiography of saphenous vein grafts  . CARDIAC CATHETERIZATION  10/20/2006   Est. EF of 55% -- Totally occuladed native coronary circulation -- Persistent patency of the right mammary graft and the distal LAD artery with peristent patency of the left mammary graft to the proximal LAD artery with persistant patency of the saphenous vein graft to the sequentail branches of the OM        . CARPAL TUNNEL RELEASE    . CHOLECYSTECTOMY    . COLONOSCOPY  07/12/2007   Dr.Brodie- normal cecum and rectum, diverticulosis in the sigmoid colon  . COLONOSCOPY  1991   per Dr.Brodie office visit note= hyperplastic polyp  . COLONOSCOPY N/A 09/30/2014   Dr.Rourk- noraml appearing rectal mucosa, scattered L sided diverticula, colonic mucosa has a somewhat pale friable appearence diffusely, no ulcers or erosions seen, the distal 10cm of terminal ileum appeared entirely normal. bx=benign colorectal mucosa with lymphocytic colitis  . CORONARY ARTERY BYPASS GRAFT  1985  . CORONARY ARTERY BYPASS GRAFT  03/09/1999   Redo CABG x3 --  with the free right internal mammary artey to the LAD with an LAD endarterectomy -- Sequential saphenous vein graft to OM1 and OM2                   . CYSTOSCOPY/RETROGRADE/URETEROSCOPY Right 12/21/2017   Procedure: [id number];  Surgeon: Raynelle Bring, MD;  Location: [facility];  Service: Urology;  Laterality: Right;  . ESOPHAGOGASTRODUODENOSCOPY N/A 09/30/2014   Dr.Rourk-mild erosive reflux esophagitis s/p bariatric surgery  . EYE SURGERY     catracts  . GASTRIC BYPASS  2004  . INCISION AND DRAINAGE ABSCESS N/A 01/11/2018   Procedure: INCISION AND DRAINAGE ABSCESS;  Surgeon: Raynelle Bring, MD;  Location: [facility];   Service: Urology;  Laterality: N/A;  . IR IMAGING GUIDED PORT INSERTION  05/09/2019  . LAPAROSCOPIC NEPHRECTOMY Right 12/28/2017   Procedure: LAPAROSCOPIC RADICAL NEPHRECTOMY;  Surgeon: Raynelle Bring, MD;  Location: [facility];  Service: Urology;  Laterality: Right;  . RIGHT/LEFT HEART CATH AND CORONARY/GRAFT ANGIOGRAPHY N/A 04/04/2017   Procedure: RIGHT/LEFT HEART CATH AND CORONARY/GRAFT ANGIOGRAPHY;  Surgeon: Martinique, Cyrah Mclamb M, MD;  Location: [facility];  Service: Cardiovascular;  Laterality: N/A;  . TEE WITHOUT CARDIOVERSION N/A 05/09/2017   Procedure: TRANSESOPHAGEAL ECHOCARDIOGRAM (TEE);  Surgeon: Sherren Mocha, MD;  Location: [facility];  Service: Open Heart Surgery;  Laterality: N/A;  . TRANSCATHETER AORTIC VALVE REPLACEMENT, TRANSFEMORAL N/A 05/09/2017   Procedure: TRANSCATHETER AORTIC VALVE REPLACEMENT, TRANSFEMORAL using a 44mm Edwards Sapien 3 Aortic Valve;  Surgeon: Sherren Mocha, MD;  Location: [facility];  Service: Open Heart Surgery;  Laterality: N/A;    Social History   Tobacco Use  Smoking Status Current Some Day Smoker  . Packs/day: 0.50  . Types: Cigarettes  . Start date: 01/10/1985  . Last attempt to quit: 02/11/2016  . Years since quitting: 3.7  Smokeless Tobacco Never Used  Tobacco Comment   smokes 1-2 cigarettes a day    Social History   Substance and Sexual Activity  Alcohol Use Yes  . Alcohol/week: 0.0 standard drinks   Comment: rarely wine    Family History  Problem Relation Age of Onset  . Stroke Father   . Diabetes Mother   . Pancreatic cancer Mother     Review of Systems: The review of systems is as above.  All other systems were reviewed and are negative.  Physical Exam: There were no vitals taken for this visit. GENERAL:  Obese WF in NAD HEENT:  PERRL, EOMI, sclera are clear. Oropharynx is clear. NECK:  No jugular venous distention, carotid upstroke brisk and symmetric, bilateral  bruits, no thyromegaly or adenopathy LUNGS:  Bibasilar crackles  L>R CHEST:  Unremarkable HEART:  RRR,  PMI not displaced or sustained,S1 and S2 within normal limits, no S3, no S4: no clicks, no rubs, slight  systolic murmur RUSB ABD:  Soft, nontender. BS + EXT: pulses in feet not palpable, feet warm and dry. no edema, no cyanosis no clubbing SKIN:  Warm and dry.  No rashes NEURO:  Alert and oriented x 3. Cranial nerves II through XII intact. PSYCH:  Cognitively intact     LABORATORY DATA: Lab Results  Component Value Date   CHOL 149 02/04/2014   HDL 60 02/04/2014   LDLCALC 64 02/04/2014   TRIG 124 02/04/2014   CHOLHDL 2.5 02/04/2014   Lab Results  Component Value Date   WBC 9.8 10/11/2019   HGB 12.3 10/11/2019   HCT 36.3 10/11/2019   PLT 98 (L) 10/11/2019   GLUCOSE 96 10/11/2019   CHOL 149 02/04/2014   TRIG 124 02/04/2014   HDL 60 02/04/2014   LDLCALC 64 02/04/2014   ALT 19 10/11/2019   AST 20 10/11/2019   NA 134 (L) 10/11/2019   K 3.7 10/11/2019   CL 108 10/11/2019   CREATININE 1.10 (H) 10/11/2019   BUN 15 10/11/2019   CO2 22 10/11/2019   TSH 2.356 10/11/2019   INR 1.0 05/09/2019   HGBA1C 5.8 (H) 05/05/2017   Labs dated 10/05/15: glucose 108. Otherwise CMET is normal. Cholesterol 130, triglycerides 114, HDL 63, LDL 44. TSH normal.  Dated 11/02/16: cholesterol 138, triglycerides 78, LDL 49, HDL 73. CMET and CBC normal. Dated 09/14/18: normal CBC. Creatinine 1.49. other chemistries normal. Cholesterol 137, triglycerides 114, HDL 57, LDL 60.  Dated 09/20/18: creatinine 1.02.   Echo 04/19/16: Study Conclusions  - Left ventricle: The cavity size was normal. Wall thickness was   normal. Systolic function was normal. The estimated ejection   fraction was in the range of 60% to 65%. Wall motion was normal;   there were no regional wall motion abnormalities. Doppler   parameters are consistent with pseudonormal left ventricular   relaxation (grade 2 diastolic dysfunction). The E/e&' ratio is   >15, suggesting elevated LV filling  pressure. - Aortic valve: Calcified leaflets. Moderate stenosis. Mean   gradient (S): 35 mm Hg. Peak gradient (S): 61 mm Hg. Valve area   (VTI): 0.98 cm^2. Valve area (Vmax): 1.05 cm^2. Valve area   (Vmean): 0.98 cm^2. - Mitral valve: Mildly thickened leaflets . There was mild   regurgitation. - Left atrium: The atrium was moderately dilated. - Right ventricle: The cavity size was mildly dilated. Systolic   function is mildly reduced. - Tricuspid valve: There was mild regurgitation. - Pulmonary arteries: PA peak pressure: 34 mm Hg (S). - Inferior vena cava: The vessel was normal in size. The   respirophasic diameter changes were in the normal range (>= 50%),   consistent with normal central venous pressure.  Impressions:  - Compared to a prior study in 2012, there is now moderate aortic   stenosis - AVA around 1.05 cm2 with mean gradient of 35 mmHg.  Echo 03/29/17: Study Conclusions  - Left ventricle: The cavity size was normal. Wall thickness was   increased in a pattern of mild LVH. Systolic function was normal.   The estimated ejection fraction was in the range of 55% to 60%.   Wall motion was normal; there were no regional wall motion   abnormalities. Doppler parameters are consistent with abnormal   left ventricular relaxation (grade 1 diastolic dysfunction). - Aortic valve: Poorly visualized. Probably trileaflet; severely   calcified leaflets. There was severe stenosis. There was mild   regurgitation. Mean gradient (S): 41 mm Hg. Peak gradient (S): 67   mm Hg. Valve area (VTI): 0.69 cm^2. - Mitral valve: Mildly calcified annulus. Mildly calcified leaflets   . There was trivial regurgitation. - Left atrium: The atrium was moderately dilated. - Right ventricle: The cavity size was normal. Systolic function   was mildly reduced. - Tricuspid valve: Peak RV-RA gradient (S): 32 mm Hg. - Pulmonary arteries: PA peak pressure: 35 mm Hg (S). - Inferior vena cava: The vessel was  normal in size. The   respirophasic diameter changes were in the normal range (>= 50%),   consistent with normal central venous pressure.  Impressions:  - Normal LV size with mild LV hypertrophy. EF 55-60%. Severe aortic   stenosis, mild aortic insufficiency. Moderate left atrial   enlargement. Normal RV size with mildly decreased systolic   function.  Cardiac cath 04/04/17:  RIGHT/LEFT HEART CATH AND CORONARY/GRAFT ANGIOGRAPHY  Conclusion     Ost LAD to Prox LAD lesion is 100% stenosed.  Prox LAD to Mid LAD lesion is 100% stenosed.  Ost Cx to Mid Cx lesion is 100% stenosed.  Prox RCA to Mid RCA lesion is 100% stenosed.  LIMA graft was visualized by angiography and is normal in caliber.  The graft exhibits no disease.  Seq SVG- OM 1 and OM 2 graft was visualized by angiography and is large.  SVG graft was visualized by angiography and is normal in caliber.  The graft exhibits no disease.  Dist Graft lesion is 30% stenosed.  Origin lesion is 35% stenosed.  There is severe aortic valve stenosis.  LV end diastolic pressure is mildly elevated.  Hemodynamic findings consistent with mild pulmonary hypertension.   1. Severe 3 vessel occlusive CAD 2. Patent LIMA to the mid LAD. This only supplies a septal perforator.  3. Patent SVG to mid to distal LAD 4. Patent sequential SVG to OM 1 and OM 2 5. Severe Aortic stenosis. Mean gradient 32 mm Hg, peak gradient 35 mm Hg. Valve area 1.1 cm squared. Index 0.56 6. Normal cardiac output 7. Mild pulmonary HTN with mildly elevated LV filling pressures.   Plan: Coronary circulation is unchanged from 2008. Patient has progressive symptomatic aortic stenosis. Will refer for TAVR evaluation.    Echo 06/01/17: Study Conclusions  - Left ventricle: The cavity size was normal. Wall thickness was   increased in a pattern of mild LVH. Systolic function was normal.   The estimated ejection fraction was in the range of 55% to  60%.   Wall motion was normal; there were no regional wall motion   abnormalities. Doppler parameters are consistent with abnormal   left ventricular relaxation (grade 1 diastolic dysfunction).   Doppler parameters are consistent with high ventricular filling   pressure. - Aortic valve: A bioprosthesis was present. Valve area (VTI): 1.56   cm^2. - Mitral valve: There was mild regurgitation. - Left atrium: The atrium was mildly dilated.  Impressions:  - Normal LV systolic function; mild LVH; mild diastolic   dysfunction; s/p AVR with mean gradient 14 mmHg and no AI; mild   MR; mild LAE; mild TR.  Assessment / Plan: 1. Coronary disease status post CABG. Redo coronary bypass surgery in 2001 with sequential saphenous vein graft to the first and second obtuse marginal vessels and a free RIMA graft to the LAD. Prior LIMA graft to the proximal LAD. Myoview study January 2016 was stable showing anterior ischemia similar to prior studies.  Cardiac cath in March 2019 showed patent grafts. No  angina. Continue aspirin, beta blocker, and statin therapy.   2. Severe Aortic stenosis s/p TAVR in April 2019. . Follow up Echo satisfactory. Routine  SBE prophylaxis.   3. PAD with right iliac occlusion- chornic. Claudication more noticeable since she is more active.  Dopplers in August 2018 showed stable ABI of 0.75 on the right and normal on the left. Abd/pelvic CT in April 2019 was stable. Repeat dopplers in July 2020 showed no change  4. Abdominal aortic aneurysm measuring 3.4 x 3.0 cm by CT. Abd [place] in August 2021 showed 3.4 cm AAA.   4. Hypertension- well controlled.  Continue  meds. Will need to monitor with new chemotherapy agents.   6. Tobacco abuse-encouraged complete cessation. Smoking only occasionally.   7. Obesity.  8. Lymphocytic colitis. No active symptoms  9. Carotid arterial disease- no significant stenosis.   10. 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 Now with  extensive adenopathy in hilar and mediastinal areas c/w stage 4 metastatic disease. As per oncology.    I will follow up in 6 months.

## 2019-10-30 ENCOUNTER — Encounter: Payer: Self-pay | Admitting: Oncology

## 2019-10-30 ENCOUNTER — Telehealth: Payer: Self-pay

## 2019-10-30 ENCOUNTER — Other Ambulatory Visit: Payer: Self-pay | Admitting: Oncology

## 2019-10-30 ENCOUNTER — Ambulatory Visit: Payer: Medicare Other | Admitting: Cardiology

## 2019-10-30 MED ORDER — DIPHENOXYLATE-ATROPINE 2.5-0.025 MG PO TABS: 1.0000 | ORAL_TABLET | Freq: Four times a day (QID) | ORAL | 0 refills | Status: AC | PRN

## 2019-10-30 NOTE — Telephone Encounter (Signed)
-----   Message from Wyatt Portela, MD sent at 10/30/2019  2:04 PM EDT ----- Rx for lomotil sent to her pharamcy. Thanks ----- Message ----- From: Kennedy Bucker, LPN Sent: [id number]   1:38 PM EDT To: Wyatt Portela, MD  Patient's daughter left a message stating she's been having diarrhea the past few days. Patient daughter states she's been taking imodium 4mg  every few hours. And it has not slowed her diarrhea down. Wants to know if there anything that can be prescribed for her diarrhea. Please advise.  Kim LPN

## 2019-10-30 NOTE — Telephone Encounter (Signed)
Called patient's daughter to notify her of the new prescription of lomotil sent in to the pharmacy.

## 2019-11-01 ENCOUNTER — Inpatient Hospital Stay: Payer: Medicare Other

## 2019-11-01 ENCOUNTER — Other Ambulatory Visit: Payer: Self-pay

## 2019-11-01 ENCOUNTER — Inpatient Hospital Stay (HOSPITAL_BASED_OUTPATIENT_CLINIC_OR_DEPARTMENT_OTHER): Payer: Medicare Other | Admitting: Oncology

## 2019-11-01 VITALS — BP 101/59 | HR 69 | Temp 97.5°F | Resp 18 | Ht 67.0 in | Wt 188.5 lb

## 2019-11-01 VITALS — BP 97/49 | HR 67 | Resp 18

## 2019-11-01 DIAGNOSIS — Z95828 Presence of other vascular implants and grafts: Secondary | ICD-10-CM | POA: Diagnosis not present

## 2019-11-01 DIAGNOSIS — Z5112 Encounter for antineoplastic immunotherapy: Secondary | ICD-10-CM | POA: Diagnosis not present

## 2019-11-01 DIAGNOSIS — C641 Malignant neoplasm of right kidney, except renal pelvis: Secondary | ICD-10-CM | POA: Diagnosis not present

## 2019-11-01 DIAGNOSIS — C649 Malignant neoplasm of unspecified kidney, except renal pelvis: Secondary | ICD-10-CM

## 2019-11-01 LAB — CMP (CANCER CENTER ONLY)
ALT: 27 U/L (ref 0–44)
AST: 25 U/L (ref 15–41)
Albumin: 2.5 g/dL — ABNORMAL LOW (ref 3.5–5.0)
Alkaline Phosphatase: 59 U/L (ref 38–126)
Anion gap: 6 (ref 5–15)
BUN: 13 mg/dL (ref 8–23)
CO2: 22 mmol/L (ref 22–32)
Calcium: 8.6 mg/dL — ABNORMAL LOW (ref 8.9–10.3)
Chloride: 108 mmol/L (ref 98–111)
Creatinine: 1.04 mg/dL — ABNORMAL HIGH (ref 0.44–1.00)
GFR, Estimated: 55 mL/min — ABNORMAL LOW (ref >60)
Glucose, Bld: 95 mg/dL (ref 70–99)
Potassium: 3.3 mmol/L — ABNORMAL LOW (ref 3.5–5.1)
Sodium: 136 mmol/L (ref 135–145)
Total Bilirubin: 1 mg/dL (ref 0.3–1.2)
Total Protein: 4.9 g/dL — ABNORMAL LOW (ref 6.5–8.1)

## 2019-11-01 LAB — CBC WITH DIFFERENTIAL (CANCER CENTER ONLY)
Abs Immature Granulocytes: 0.03 10*3/uL (ref 0.00–0.07)
Basophils Absolute: 0 10*3/uL (ref 0.0–0.1)
Basophils Relative: 1 %
Eosinophils Absolute: 0 10*3/uL (ref 0.0–0.5)
Eosinophils Relative: 0 %
HCT: 34.6 % — ABNORMAL LOW (ref 36.0–46.0)
Hemoglobin: 11.9 g/dL — ABNORMAL LOW (ref 12.0–15.0)
Immature Granulocytes: 0 %
Lymphocytes Relative: 20 %
Lymphs Abs: 1.8 10*3/uL (ref 0.7–4.0)
MCH: 34.5 pg — ABNORMAL HIGH (ref 26.0–34.0)
MCHC: 34.4 g/dL (ref 30.0–36.0)
MCV: 100.3 fL — ABNORMAL HIGH (ref 80.0–100.0)
Monocytes Absolute: 0.7 10*3/uL (ref 0.1–1.0)
Monocytes Relative: 8 %
Neutro Abs: 6.1 10*3/uL (ref 1.7–7.7)
Neutrophils Relative %: 71 %
Platelet Count: 81 10*3/uL — ABNORMAL LOW (ref 150–400)
RBC: 3.45 MIL/uL — ABNORMAL LOW (ref 3.87–5.11)
RDW: 13.9 % (ref 11.5–15.5)
WBC Count: 8.6 10*3/uL (ref 4.0–10.5)
nRBC: 0 % (ref 0.0–0.2)

## 2019-11-01 LAB — TSH: TSH: 6.513 u[IU]/mL — ABNORMAL HIGH (ref 0.308–3.960)

## 2019-11-01 MED ORDER — HEPARIN SOD (PORK) LOCK FLUSH 100 UNIT/ML IV SOLN
500.0000 [IU] | Freq: Once | INTRAVENOUS | Status: AC | PRN
  Administered 2019-11-01: 500 [IU]
  Filled 2019-11-01: qty 5

## 2019-11-01 MED ORDER — SODIUM CHLORIDE 0.9% FLUSH
10.0000 mL | Freq: Once | INTRAVENOUS | Status: AC
  Administered 2019-11-01: 10 mL
  Filled 2019-11-01: qty 10

## 2019-11-01 MED ORDER — SODIUM CHLORIDE 0.9% FLUSH
10.0000 mL | Freq: Once | INTRAVENOUS | Status: AC | PRN
  Administered 2019-11-01: 10 mL
  Filled 2019-11-01: qty 10

## 2019-11-01 MED ORDER — SODIUM CHLORIDE 0.9 % IV SOLN
Freq: Once | INTRAVENOUS | Status: AC
  Filled 2019-11-01: qty 250

## 2019-11-01 NOTE — Progress Notes (Signed)
Hematology and Oncology Follow Up Visit  CIEL CHERVENAK [id number] [birth year] 79 y.o. 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    Prior Therapy: She underwent right laparoscopic radical nephrectomy completed by Dr. Alinda Money on December 28, 2017.  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  The final pathological stage was T1b.   Pembrolizumab 200 mg every 3 weeks started on April 25, 2019.  She completed 5 cycles of therapy with axitinib 5 mg daily with the last cycle given on July 26, 2019.    Current therapy: Pembrolizumab 200 mg every 3 weeks resumed on October 11, 2019.  Interim History: Ms. Ashbaugh is here for a follow-up visit.  Since the last visit, she reports overall generalized weakness and fatigue and overall doing poorly.  She has reported diarrhea in the last few days with loose bowel habits 2-3 times a day.  He is starting to Lomotil which have helped her symptoms.  She denies any nausea, vomiting or abdominal pain.  She denies any fevers chills.  Her mobility has been limited.       Medications: Reviewed and unchanged. Current Outpatient Medications  Medication Sig Dispense Refill  . acetaminophen (TYLENOL) 500 MG tablet Take 1,000 mg by mouth every 6 (six) hours as needed for moderate pain or headache.     . ALPRAZolam (XANAX) 0.25 MG tablet Take 0.25 mg by mouth 3 (three) times daily as needed for anxiety.  0  . amLODipine (NORVASC) 10 MG tablet Take 1 tablet (10 mg total) by mouth daily. 90 tablet 1  . aspirin EC 81 MG tablet Take 81 mg by mouth at bedtime.     . calcium elemental as carbonate (BARIATRIC TUMS ULTRA) 400 MG chewable tablet Chew 1,000 mg by mouth 3 (three) times daily as needed for heartburn.    . clobetasol ointment (TEMOVATE) 8.30 %  Apply 1 application topically 2 (two) times daily as needed (Rash).     . diclofenac Sodium (VOLTAREN) 1 % GEL Apply 4 g topically 3 (three) times daily as needed (Right knee pain).     [name] diphenoxylate-atropine (LOMOTIL) 2.5-0.025 MG tablet Take 1 tablet by mouth 4 (four) times daily as needed for diarrhea or loose stools. 30 tablet 0  . FLUoxetine (PROZAC) 40 MG capsule Take 40 mg by mouth every evening.    . furosemide (LASIX) 20 MG tablet Take 20 mg by mouth daily as needed for edema.    . INLYTA 5 MG tablet TAKE 1 TABLET (5 MG TOTAL) BY MOUTH 2 (TWO) TIMES DAILY. (Patient taking differently: Take 5 mg by mouth once. Ppt taking 1 tablet daily) 60 tablet 0  . irbesartan (AVAPRO) 300 MG tablet TAKE 1 TABLET(300 MG) BY MOUTH DAILY 90 tablet 1  . levothyroxine (SYNTHROID) 100 MCG tablet Take 1 tablet (100 mcg total) by mouth daily before breakfast. 90 tablet 3  . loperamide (IMODIUM) 2 MG capsule Take 2-4 mg by mouth 4 (four) times daily as needed for diarrhea or loose stools.     . magic mouthwash SOLN Take 5 mLs by mouth.    . magic mouthwash w/lidocaine SOLN Take 5 mLs by mouth 4 (four) times daily as needed for mouth pain. Swish and spit 240 mL 0  . Nebivolol HCl (BYSTOLIC) 20 MG TABS Take 1  tablet (20 mg total) by mouth daily. 30 tablet 11  . nitroGLYCERIN (NITROSTAT) 0.4 MG SL tablet Place 0.4 mg under the tongue every 5 (five) minutes as needed for chest pain (MAX 3 TABLETS).    [name] nystatin (MYCOSTATIN/NYSTOP) powder Apply 1 application topically 3 (three) times daily.     . pantoprazole (PROTONIX) 40 MG tablet TAKE 1 TABLET BY MOUTH EVERY DAY 90 tablet 0  . PROAIR HFA 108 (90 Base) MCG/ACT inhaler Inhale 2 puffs into the lungs every 4 (four) hours as needed for shortness of breath or wheezing.  2  . Probiotic Product (PROBIOTIC PO) Take 1 capsule by mouth daily.    . prochlorperazine (COMPAZINE) 10 MG tablet TAKE 1 TABLET(10 MG) BY MOUTH EVERY 6 HOURS AS NEEDED FOR NAUSEA OR VOMITING 30 tablet  3  . simethicone (MYLICON) 762 MG chewable tablet Chew 125 mg by mouth every 6 (six) hours as needed for flatulence.    . simvastatin (ZOCOR) 40 MG tablet Take 1 tablet (40 mg total) by mouth at bedtime. 90 tablet 3  . SYMBICORT 160-4.5 MCG/ACT inhaler Inhale 1 puff into the lungs 2 (two) times daily as needed (for respiratory issues.).   2   No current facility-administered medications for this visit.   Facility-Administered Medications Ordered in Other Visits  Medication Dose Route Frequency Provider Last Rate Last Admin  . sodium chloride flush (NS) 0.9 % injection 10 mL  10 mL Intracatheter Once Wyatt Portela, MD         Allergies:  Allergies  Allergen Reactions  . Chantix [Varenicline Tartrate] Hives  . Dipyridamole Hives and Rash      Physical Exam:   Blood pressure (!) 101/59, pulse 69, temperature (!) 97.5 F (36.4 C), temperature source Tympanic, resp. rate 18, height 5\' 7"  (1.702 m), weight 188 lb 8 oz (85.5 kg), SpO2 100 %.       ECOG: 1      General appearance: Alert, awake without any distress.  Appears chronically ill. Head: Atraumatic without abnormalities Oropharynx: Without any thrush or ulcers. Eyes: No scleral icterus. Lymph nodes: No lymphadenopathy noted in the cervical, supraclavicular, or axillary nodes Heart:regular rate and rhythm, without any murmurs or gallops.   Lung: Clear to auscultation without any rhonchi, wheezes or dullness to percussion. Abdomin: Soft, nontender without any shifting dullness or ascites. Musculoskeletal: No clubbing or cyanosis. Neurological: No motor or sensory deficits. Skin: No rashes or lesions.             Lab Results: Lab Results  Component Value Date   WBC 9.8 10/11/2019   HGB 12.3 10/11/2019   HCT 36.3 10/11/2019   MCV 98.9 10/11/2019   PLT 98 (L) 10/11/2019     Chemistry      Component Value Date/Time   NA 134 (L) 10/11/2019 1235   NA 139 03/31/2017 1133   K 3.7 10/11/2019 1235    CL 108 10/11/2019 1235   CO2 22 10/11/2019 1235   BUN 15 10/11/2019 1235   BUN 17 03/31/2017 1133   CREATININE 1.10 (H) 10/11/2019 1235   CREATININE 0.62 09/26/2014 1043      Component Value Date/Time   CALCIUM 8.7 (L) 10/11/2019 1235   ALKPHOS 55 10/11/2019 1235   AST 20 10/11/2019 1235   ALT 19 10/11/2019 1235   BILITOT 1.1 10/11/2019 1235         Impression and Plan:  79 year old woman with:  1.    Stage IV clear-cell renal cell  carcinoma with pulmonary involvement diagnosed in March 2021.    She has had resumed Pembrolizumab as a single agent on October 11, 2019 with symptoms of diarrhea noted last few days.  Risks and benefits of continuing this treatment were discussed at this time.  Immune mediated complications including pneumonitis, colitis and hepatitis were reiterated.  After discussion today, I recommended that with holding treatment at this time given her overall debilitation and possible complications related to treatment.  I will update her staging scans before the next visit and will determine best course of action accordingly.   2.  IV access: Port-A-Cath remains in currently in use.  3.  Antiemetics: No nausea or vomiting reported at this time.  4.  Goals of care and prognosis: Therapy remains palliative globus measures are warranted given her reasonable performance status.   5.  Immune mediated complications: She could be experiencing colitis given her diarrhea symptoms.  6.  Hypotension: Her blood pressure is low at this time.  I recommended holding Norvasc for the time being and I will update her PCP and cardiologist regarding this issue.  This could be contributing to her excessive fatigue tiredness.  She will receive gentle hydration today given her recent diarrhea and dehydration.  7.  Hypothyroidism: Her TSH is back to normal range with thyroid replacement.  8.  Thrombocytopenia: Likely related to axitinib which has improved after the therapy  discontinuation.  9.  Follow-up: We will be in the next 3 to 4 weeks for repeat evaluation.    30 minutes were dedicated to this visit.  The time was spent on reviewing disease status, discussing treatment options and future plan of care reviewed.    Zola Button, MD 10/22/20219:51 AM

## 2019-11-01 NOTE — Patient Instructions (Signed)

## 2019-11-08 ENCOUNTER — Encounter: Payer: Self-pay | Admitting: Oncology

## 2019-11-08 ENCOUNTER — Emergency Department (HOSPITAL_BASED_OUTPATIENT_CLINIC_OR_DEPARTMENT_OTHER)
Admit: 2019-11-08 | Discharge: 2019-11-08 | Disposition: A | Discharge status: Home / Self Care | Payer: Medicare Other | Attending: Emergency Medicine | Admitting: Emergency Medicine

## 2019-11-08 ENCOUNTER — Emergency Department (HOSPITAL_COMMUNITY): Payer: Medicare Other

## 2019-11-08 ENCOUNTER — Encounter (HOSPITAL_COMMUNITY): Payer: Self-pay

## 2019-11-08 ENCOUNTER — Other Ambulatory Visit: Payer: Self-pay

## 2019-11-08 ENCOUNTER — Emergency Department (HOSPITAL_COMMUNITY)
Admission: EM | Admit: 2019-11-08 | Discharge: 2019-11-08 | Disposition: A | Discharge status: Home / Self Care | Payer: Medicare Other | Attending: Emergency Medicine | Admitting: Emergency Medicine

## 2019-11-08 DIAGNOSIS — Z79890 Hormone replacement therapy (postmenopausal): Secondary | ICD-10-CM | POA: Diagnosis not present

## 2019-11-08 DIAGNOSIS — Z85528 Personal history of other malignant neoplasm of kidney: Secondary | ICD-10-CM | POA: Insufficient documentation

## 2019-11-08 DIAGNOSIS — R531 Weakness: Secondary | ICD-10-CM

## 2019-11-08 DIAGNOSIS — E039 Hypothyroidism, unspecified: Secondary | ICD-10-CM | POA: Insufficient documentation

## 2019-11-08 DIAGNOSIS — Z79899 Other long term (current) drug therapy: Secondary | ICD-10-CM | POA: Insufficient documentation

## 2019-11-08 DIAGNOSIS — I110 Hypertensive heart disease with heart failure: Secondary | ICD-10-CM | POA: Insufficient documentation

## 2019-11-08 DIAGNOSIS — F17210 Nicotine dependence, cigarettes, uncomplicated: Secondary | ICD-10-CM | POA: Diagnosis not present

## 2019-11-08 DIAGNOSIS — I2510 Atherosclerotic heart disease of native coronary artery without angina pectoris: Secondary | ICD-10-CM | POA: Insufficient documentation

## 2019-11-08 DIAGNOSIS — R609 Edema, unspecified: Secondary | ICD-10-CM

## 2019-11-08 DIAGNOSIS — J449 Chronic obstructive pulmonary disease, unspecified: Secondary | ICD-10-CM | POA: Insufficient documentation

## 2019-11-08 DIAGNOSIS — Z7982 Long term (current) use of aspirin: Secondary | ICD-10-CM | POA: Insufficient documentation

## 2019-11-08 DIAGNOSIS — I5032 Chronic diastolic (congestive) heart failure: Secondary | ICD-10-CM | POA: Insufficient documentation

## 2019-11-08 DIAGNOSIS — Z955 Presence of coronary angioplasty implant and graft: Secondary | ICD-10-CM | POA: Insufficient documentation

## 2019-11-08 LAB — COMPREHENSIVE METABOLIC PANEL
ALT: 33 U/L (ref 0–44)
AST: 26 U/L (ref 15–41)
Albumin: 2.3 g/dL — ABNORMAL LOW (ref 3.5–5.0)
Alkaline Phosphatase: 48 U/L (ref 38–126)
Anion gap: 10 (ref 5–15)
BUN: 14 mg/dL (ref 8–23)
CO2: 22 mmol/L (ref 22–32)
Calcium: 8.5 mg/dL — ABNORMAL LOW (ref 8.9–10.3)
Chloride: 105 mmol/L (ref 98–111)
Creatinine, Ser: 1.11 mg/dL — ABNORMAL HIGH (ref 0.44–1.00)
GFR, Estimated: 51 mL/min — ABNORMAL LOW (ref >60)
Glucose, Bld: 103 mg/dL — ABNORMAL HIGH (ref 70–99)
Potassium: 3 mmol/L — ABNORMAL LOW (ref 3.5–5.1)
Sodium: 137 mmol/L (ref 135–145)
Total Bilirubin: 1.2 mg/dL (ref 0.3–1.2)
Total Protein: 4.9 g/dL — ABNORMAL LOW (ref 6.5–8.1)

## 2019-11-08 LAB — CBC WITH DIFFERENTIAL/PLATELET
Abs Immature Granulocytes: 0.04 10*3/uL (ref 0.00–0.07)
Basophils Absolute: 0 10*3/uL (ref 0.0–0.1)
Basophils Relative: 0 %
Eosinophils Absolute: 0 10*3/uL (ref 0.0–0.5)
Eosinophils Relative: 0 %
HCT: 34.4 % — ABNORMAL LOW (ref 36.0–46.0)
Hemoglobin: 11.6 g/dL — ABNORMAL LOW (ref 12.0–15.0)
Immature Granulocytes: 0 %
Lymphocytes Relative: 20 %
Lymphs Abs: 1.8 10*3/uL (ref 0.7–4.0)
MCH: 34.5 pg — ABNORMAL HIGH (ref 26.0–34.0)
MCHC: 33.7 g/dL (ref 30.0–36.0)
MCV: 102.4 fL — ABNORMAL HIGH (ref 80.0–100.0)
Monocytes Absolute: 0.8 10*3/uL (ref 0.1–1.0)
Monocytes Relative: 9 %
Neutro Abs: 6.2 10*3/uL (ref 1.7–7.7)
Neutrophils Relative %: 71 %
Platelets: 83 10*3/uL — ABNORMAL LOW (ref 150–400)
RBC: 3.36 MIL/uL — ABNORMAL LOW (ref 3.87–5.11)
RDW: 14.1 % (ref 11.5–15.5)
WBC: 8.9 10*3/uL (ref 4.0–10.5)
nRBC: 0 % (ref 0.0–0.2)

## 2019-11-08 LAB — URINALYSIS, ROUTINE W REFLEX MICROSCOPIC
Bilirubin Urine: NEGATIVE
Glucose, UA: NEGATIVE mg/dL
Hgb urine dipstick: NEGATIVE
Ketones, ur: NEGATIVE mg/dL
Nitrite: NEGATIVE
Protein, ur: NEGATIVE mg/dL
Specific Gravity, Urine: 1.003 — ABNORMAL LOW (ref 1.005–1.030)
pH: 6 (ref 5.0–8.0)

## 2019-11-08 LAB — TROPONIN I (HIGH SENSITIVITY)
Troponin I (High Sensitivity): 23 ng/L — ABNORMAL HIGH (ref <18)
Troponin I (High Sensitivity): 22 ng/L — ABNORMAL HIGH (ref <18)

## 2019-11-08 MED ORDER — POTASSIUM CHLORIDE ER 10 MEQ PO TBCR: 10.0000 meq | EXTENDED_RELEASE_TABLET | Freq: Every day | ORAL | 0 refills | Status: DC

## 2019-11-08 MED ORDER — POTASSIUM CHLORIDE CRYS ER 20 MEQ PO TBCR
40.0000 meq | EXTENDED_RELEASE_TABLET | Freq: Once | ORAL | Status: AC
  Administered 2019-11-08: 40 meq via ORAL
  Filled 2019-11-08: qty 2

## 2019-11-08 MED ORDER — HEPARIN SOD (PORK) LOCK FLUSH 100 UNIT/ML IV SOLN
500.0000 [IU] | Freq: Once | INTRAVENOUS | Status: AC
  Administered 2019-11-08: 500 [IU]
  Filled 2019-11-08: qty 5

## 2019-11-08 NOTE — Progress Notes (Signed)
Left lower extremity venous duplex has been completed. Preliminary results can be found in CV Proc through chart review.  Results were given to Krista Blue PA.  11/08/19 2:21 PM [name] RVT

## 2019-11-08 NOTE — Discharge Instructions (Addendum)
You had a low potassium of 3.0.  You were administered 40 mEq K-Dur potassium replacement here in the ED, however you will need to have labs rechecked by your oncologist and/or primary care provider.  While you do have lower extremity swelling/edema, you may benefit from switching to a potassium sparing diuretic.  The remainder of your work-up was entirely reassuring.  Your weakness could be related to the low potassium or to your recent chemotherapy infusion.  I have placed a face-to-face consult as well as a consult with case management to try and find you a more sustainable long-term plan given your worsening fatigue and weakness and difficulty with activities of daily living.  Please return to the ED or seek immediate medical attention should you experience any new or worsening symptoms.

## 2019-11-08 NOTE — ED Notes (Signed)
PA approved PO. Pt received ice chips.

## 2019-11-08 NOTE — ED Triage Notes (Signed)
EMS reports pt c/o increasing weakness 7-10 days. Needs extra assistance to ambulate. NVD. Medication without relief. Hx metastatic kidney cancer. Denies pain, AMS, and UTI symptoms. EMS reports clear lung sounds. Pt took Lasix this morning. Minimal intake last 7-10 days.   BP 120/70 HR 70 RR 18 SpO2 100% RA CBG 131 Temp 97.7

## 2019-11-08 NOTE — ED Provider Notes (Signed)
[facility] DEPT Provider Note   CSN: [id number] Arrival date & time: 11/08/19  1219     History Chief Complaint  Patient presents with  . Weakness    [name] is a 79 y.o. female with PMH of AAA, HTN, HLD, ischemic heart disease, CAD s/p CABG x4, and stage IV RCC with pulmonary involvement managed by Dr. Alen Blew, oncology, on chemotherapy who presents the ED via EMS with progressively increasing weakness x7 days.  On my examination, patient reports that she lives with her daughter at home.  She typically ambulates with a walker, but is having increasing difficulty due to muscle fatigue and weakness.  She states that she has not been eating and drinking particularly well and has diminished appetite.  She does have mild nausea symptoms with intermittent nonbloody emesis, but this has been persistent since she began chemotherapy months ago.  She denies any chest pain or shortness of breath.  She does endorse lower extremity swelling for which she takes furosemide.  She also has been having intermittent loose stools, but was recently prescribed Lomotil with good improvement.  She states that in the past month however, she has noticed worsening swelling in her left leg.  She denies any fevers or chills, abdominal pain, back pain, cough, headache or dizziness, urinary symptoms, melena, or other symptoms.  HPI     Past Medical History:  Diagnosis Date  . Abdominal aortic aneurysm ([place])    infrarenal diagnosed with maximum measurement at 3.3 cm  . Cancer ([place])   . Coronary artery disease   . Depression   . Diverticulosis   . GERD (gastroesophageal reflux disease)   . History of tobacco abuse    Recurrent cigarette smoking  . Hyperlipidemia   . Hyperplastic colon polyp   . Hypertension   . Hypothyroidism   . Ischemic heart disease    had redo bypass surgery in 2001, at that time she had a free right internal mammary to the LAD, LAD endarterectomy,  sequential vein graft to the OM1 and OM2 -- last catheterization was in 2008  . Leg fracture    remote right leg fracture  . Microscopic colitis   . Obesity    with gastric bypass surgery in 2004  . PAD (peripheral artery disease) (H[facility])    right iliac occlusion  . PONV (postoperative nausea and vomiting)   . Right kidney mass   . S/P CABG x 4 1985   LIMA to LAD, Sequential SVG to OM1-OM2, SVG to RCA - Dr Redmond Pulling  . S/P redo CABG x 3 2001   Free RIMA to LAD with endarterectomy, sequential SVG to OM1-OM2 - Dr Servando Snare  . 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 Port-A-Cath in place 07/26/2019  . Acute respiratory failure with hypoxia ([place]) 06/03/2019  . Acute encephalopathy 06/03/2019  . UTI (urinary tract infection) 06/02/2019  . Hypoxia 06/02/2019  . Goals of care, counseling/discussion 04/11/2019  . Severe sepsis ([place])   . Macrocytic anemia   . Abdominal wall abscess at site of surgical wound 01/11/2018  . Hypothyroidism 01/11/2018  . Anxiety 01/11/2018  . COPD (chronic obstructive pulmonary disease) ([place]) 01/11/2018  . AKI (acute kidney injury) ([place]) 01/11/2018  . Chronic diastolic CHF (congestive heart failure) ([place]) 01/11/2018  . Renal cell carcinoma ([place]) 01/11/2018  .  Neoplasm of right kidney 12/28/2017  . Severe aortic stenosis 05/09/2017  . 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 GERD (gastroesophageal reflux disease) 10/19/2016  . 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 Carotid arterial disease ([place]) 05/28/2015  . Bilateral carotid bruits 01/16/2015  . Reflux esophagitis   . Hiatal hernia   . Status post bariatric surgery   . Chronic diarrhea   . Diverticulosis of colon without hemorrhage   . PAD (peripheral artery disease) ([place]) 04/13/2011  . Aortic stenosis, severe  09/10/2010  . CAD (coronary artery disease) 08/11/2010  . HTN (hypertension) 08/11/2010  . Obesities, morbid ([place]) 08/11/2010  . Tobacco abuse 08/11/2010  . Abdominal aortic aneurysm ([place]) 12/28/2007  . DIVERTICULOSIS, COLON 12/28/2007  . FATTY LIVER DISEASE 12/28/2007  . ANEMIA, HX OF 12/28/2007  . NAUSEA 12/12/2007  . ABDOMINAL PAIN, EPIGASTRIC 12/12/2007  . S/P redo CABG x 3 03/09/1999  . S/P CABG x 4 04/19/1983    Past Surgical History:  Procedure Laterality Date  . ARTERIAL LINE INSERTION Right 05/09/2017   Procedure: ARTERIAL LINE  INSERTION  RIGHT RADIAL ARTERY;  Surgeon: Sherren Mocha, MD;  Location: [facility];  Service: Open Heart Surgery;  Laterality: Right;  . CARDIAC CATHETERIZATION  02/16/1999   Left heart catheterization with selective coronary  angiography, left ventricular angiography (RAO and LAO views), angiography of left internal mammary artery, and angiography of saphenous vein grafts  . CARDIAC CATHETERIZATION  10/20/2006   Est. EF of 55% -- Totally occuladed native coronary circulation -- Persistent patency of the right mammary graft and the distal LAD artery with peristent patency of the left mammary graft to the proximal LAD artery with persistant patency of the saphenous vein graft to the sequentail branches of the OM        . CARPAL TUNNEL RELEASE    . CHOLECYSTECTOMY    . COLONOSCOPY  07/12/2007   Dr.Brodie- normal cecum and rectum, diverticulosis in the sigmoid colon  . COLONOSCOPY  1991   per Dr.Brodie office visit note= hyperplastic polyp  . COLONOSCOPY N/A 09/30/2014   Dr.Rourk- noraml appearing rectal mucosa, scattered L sided diverticula, colonic mucosa has a somewhat pale friable appearence diffusely, no ulcers or erosions seen, the distal 10cm of terminal ileum appeared entirely normal. bx=benign colorectal mucosa with lymphocytic colitis  . CORONARY ARTERY BYPASS GRAFT  1985  . CORONARY ARTERY BYPASS GRAFT  03/09/1999   Redo CABG x3 --  with the free  right internal mammary artey to the LAD with an LAD endarterectomy -- Sequential saphenous vein graft to OM1 and OM2                   . CYSTOSCOPY/RETROGRADE/URETEROSCOPY Right 12/21/2017   Procedure: DJMEQASTMH/DQQIWLNLGX;  Surgeon: Raynelle Bring, MD;  Location[facility];  Service: Urology;  Laterality: Right;  . ESOPHAGOGASTRODUODENOSCOPY N/A 09/30/2014   Dr.Rourk-mild erosive reflux esophagitis s/p bariatric surgery  . EYE SURGERY     catracts  . GASTRIC BYPASS  2004  . INCISION AND DRAINAGE ABSCESS N/A 01/11/2018   Procedure: INCISION AND DRAINAGE ABSCESS;  Surgeon: Raynelle Bring, MD;  Location: [facility];  Service: Urology;  Laterality: N/A;  . IR IMAGING GUIDED PORT INSERTION  05/09/2019  . LAPAROSCOPIC NEPHRECTOMY Right 12/28/2017   Procedure: LAPAROSCOPIC RADICAL NEPHRECTOMY;  Surgeon: Raynelle Bring, MD;  Location: [facility];  Service: Urology;  Laterality: Right;  . RIGHT/LEFT HEART CATH AND CORONARY/GRAFT ANGIOGRAPHY N/A 04/04/2017   Procedure: RIGHT/LEFT HEART CATH AND CORONARY/GRAFT ANGIOGRAPHY;  Surgeon: Martinique,  Ander Slade, MD;  Location: [facility];  Service: Cardiovascular;  Laterality: N/A;  . TEE WITHOUT CARDIOVERSION N/A 05/09/2017   Procedure: TRANSESOPHAGEAL ECHOCARDIOGRAM (TEE);  Surgeon: Sherren Mocha, MD;  Location: [facility];  Service: Open Heart Surgery;  Laterality: N/A;  . TRANSCATHETER AORTIC VALVE REPLACEMENT, TRANSFEMORAL N/A 05/09/2017   Procedure: TRANSCATHETER AORTIC VALVE REPLACEMENT, TRANSFEMORAL using a 95mm Edwards Sapien 3 Aortic Valve;  Surgeon: Sherren Mocha, MD;  Location: [facility];  Service: Open Heart Surgery;  Laterality: N/A;     OB History   No obstetric history on file.     Family History  Problem Relation Age of Onset  . Stroke Father   . Diabetes Mother   . Pancreatic cancer Mother     Social History   Tobacco Use  . Smoking status: Current Some Day Smoker    Packs/day: 0.50    Types: Cigarettes    Start date: 01/10/1985    Last attempt to  quit: 02/11/2016    Years since quitting: 3.7  . Smokeless tobacco: Never Used  . Tobacco comment: smokes 1-2 cigarettes a day  Vaping Use  . Vaping Use: Never used  Substance Use Topics  . Alcohol use: Yes    Alcohol/week: 0.0 standard drinks    Comment: rarely wine  . Drug use: No    Home Medications Prior to Admission medications   Medication Sig Start Date End Date Taking? Authorizing Provider  acetaminophen (TYLENOL) 500 MG tablet Take 1,000 mg by mouth every 6 (six) hours as needed for moderate pain or headache.     [provider]  ALPRAZolam Duanne Moron) 0.25 MG tablet Take 0.25 mg by mouth 3 (three) times daily as needed for anxiety. 03/20/17   [provider]  amLODipine (NORVASC) 10 MG tablet Take 1 tablet (10 mg total) by mouth daily. 03/22/19   Martinique, Peter M, MD  aspirin EC 81 MG tablet Take 81 mg by mouth at bedtime.     [provider]  calcium elemental as carbonate (BARIATRIC TUMS ULTRA) 400 MG chewable tablet Chew 1,000 mg by mouth 3 (three) times daily as needed for heartburn.    [provider]  clobetasol ointment (TEMOVATE) 3.15 % Apply 1 application topically 2 (two) times daily as needed (Rash).  04/03/19 04/02/20  [provider]  diclofenac Sodium (VOLTAREN) 1 % GEL Apply 4 g topically 3 (three) times daily as needed (Right knee pain).     [provider]  diphenoxylate-atropine (LOMOTIL) 2.5-0.025 MG tablet Take 1 tablet by mouth 4 (four) times daily as needed for diarrhea or loose stools. 10/30/19   Wyatt Portela, MD  FLUoxetine (PROZAC) 40 MG capsule Take 40 mg by mouth every evening.    [provider]  furosemide (LASIX) 20 MG tablet Take 20 mg by mouth daily as needed for edema.    [provider]  INLYTA 5 MG tablet TAKE 1 TABLET (5 MG TOTAL) BY MOUTH 2 (TWO) TIMES DAILY. Patient taking differently: Take 5 mg by mouth once. Ppt taking 1 tablet daily 07/01/19   Wyatt Portela, MD  irbesartan  (AVAPRO) 300 MG tablet TAKE 1 TABLET(300 MG) BY MOUTH DAILY 07/12/19   Martinique, Peter M, MD  levothyroxine (SYNTHROID) 100 MCG tablet Take 1 tablet (100 mcg total) by mouth daily before breakfast. 09/06/19   Wyatt Portela, MD  loperamide (IMODIUM) 2 MG capsule Take 2-4 mg by mouth 4 (four) times daily as needed for diarrhea or loose  stools.     [provider]  magic mouthwash SOLN Take 5 mLs by mouth.    [provider]  magic mouthwash w/lidocaine SOLN Take 5 mLs by mouth 4 (four) times daily as needed for mouth pain. Swish and spit 08/15/19   Wyatt Portela, MD  Nebivolol HCl (BYSTOLIC) 20 MG TABS Take 1 tablet (20 mg total) by mouth daily. 02/22/19   Martinique, Peter M, MD  nitroGLYCERIN (NITROSTAT) 0.4 MG SL tablet Place 0.4 mg under the tongue every 5 (five) minutes as needed for chest pain (MAX 3 TABLETS).    [provider]  nystatin (MYCOSTATIN/NYSTOP) powder Apply 1 application topically 3 (three) times daily.  12/12/18   [provider]  pantoprazole (PROTONIX) 40 MG tablet TAKE 1 TABLET BY MOUTH EVERY DAY 09/30/19   Annitta Needs, NP  potassium chloride (KLOR-CON) 10 MEQ tablet Take 1 tablet (10 mEq total) by mouth daily. 11/08/19   Corena Herter, PA-C  PROAIR HFA 108 (90 Base) MCG/ACT inhaler Inhale 2 puffs into the lungs every 4 (four) hours as needed for shortness of breath or wheezing. 03/14/17   [provider]  Probiotic Product (PROBIOTIC PO) Take 1 capsule by mouth daily.    [provider]  prochlorperazine (COMPAZINE) 10 MG tablet TAKE 1 TABLET(10 MG) BY MOUTH EVERY 6 HOURS AS NEEDED FOR NAUSEA OR VOMITING 10/16/19   Wyatt Portela, MD  simethicone (MYLICON) 235 MG chewable tablet Chew 125 mg by mouth every 6 (six) hours as needed for flatulence.    [provider]  simvastatin (ZOCOR) 40 MG tablet Take 1 tablet (40 mg total) by mouth at bedtime. 03/18/19   Martinique, Peter M, MD  SYMBICORT 160-4.5 MCG/ACT inhaler Inhale 1 puff  into the lungs 2 (two) times daily as needed (for respiratory issues.).  03/14/17   [provider]    Allergies    Chantix [varenicline tartrate] and Dipyridamole  Review of Systems   Review of Systems  All other systems reviewed and are negative.   Physical Exam Updated Vital Signs BP 135/69   Pulse 66   Temp 97.6 F (36.4 C) (Oral)   Resp 19   Ht 5\' 7"  (1.702 m)   Wt 89.8 kg   SpO2 99%   BMI 31.01 kg/m   Physical Exam Vitals and nursing note reviewed. Exam conducted with a chaperone present.  Constitutional:      General: She is not in acute distress.    Appearance: Normal appearance. She is not ill-appearing.  HENT:     Head: Normocephalic and atraumatic.  Eyes:     General: No scleral icterus.    Conjunctiva/sclera: Conjunctivae normal.  Cardiovascular:     Rate and Rhythm: Normal rate and regular rhythm.     Pulses: Normal pulses.     Heart sounds: Normal heart sounds.  Pulmonary:     Effort: Pulmonary effort is normal. No respiratory distress.     Breath sounds: Normal breath sounds.  Musculoskeletal:        General: Normal range of motion.     Comments: Bilateral 3+ pitting edema in lower extremities bilaterally.  Worsening swelling and edema on the left side compared to the right.  Pedal pulses intact and symmetric.  Sensation intact throughout.  ROM intact.  No overlying skin changes.  Skin:    General: Skin is dry.     Capillary Refill: Capillary refill takes less than 2 seconds.  Neurological:  Mental Status: She is alert and oriented to person, place, and time.     GCS: GCS eye subscore is 4. GCS verbal subscore is 5. GCS motor subscore is 6.  Psychiatric:        Mood and Affect: Mood normal.        Behavior: Behavior normal.        Thought Content: Thought content normal.     ED Results / Procedures / Treatments   Labs (all labs ordered are listed, but only abnormal results are displayed) Labs Reviewed  CBC WITH  DIFFERENTIAL/PLATELET - Abnormal; Notable for the following components:      Result Value   RBC 3.36 (*)    Hemoglobin 11.6 (*)    HCT 34.4 (*)    MCV 102.4 (*)    MCH 34.5 (*)    Platelets 83 (*)    All other components within normal limits  COMPREHENSIVE METABOLIC PANEL - Abnormal; Notable for the following components:   Potassium 3.0 (*)    Glucose, Bld 103 (*)    Creatinine, Ser 1.11 (*)    Calcium 8.5 (*)    Total Protein 4.9 (*)    Albumin 2.3 (*)    GFR, Estimated 51 (*)    All other components within normal limits  URINALYSIS, ROUTINE W REFLEX MICROSCOPIC - Abnormal; Notable for the following components:   Color, Urine STRAW (*)    Specific Gravity, Urine 1.003 (*)    Leukocytes,Ua MODERATE (*)    Bacteria, UA RARE (*)    All other components within normal limits  TROPONIN I (HIGH SENSITIVITY) - Abnormal; Notable for the following components:   Troponin I (High Sensitivity) 23 (*)    All other components within normal limits  TROPONIN I (HIGH SENSITIVITY) - Abnormal; Notable for the following components:   Troponin I (High Sensitivity) 22 (*)    All other components within normal limits    EKG EKG Interpretation  Date/Time:  Friday November 08 2019 12:28:52 EDT Ventricular Rate:  69 PR Interval:    QRS Duration: 110 QT Interval:  497 QTC Calculation: 533 R Axis:   86 Text Interpretation: Sinus rhythm Atrial premature complex Probable anterior infarct, age indeterminate Prolonged QT interval Confirmed by Lennice Sites [phone]) on 11/08/2019 1:50:57 PM Also confirmed by Lennice Sites [phone]), editor Hattie Perch [phone])  on 11/08/2019 2:46:04 PM   Radiology DG Chest 2 View  Result Date: 11/08/2019 CLINICAL DATA:  Weakness EXAM: CHEST - 2 VIEW COMPARISON:  06/02/2015 FINDINGS: Stable positioning of right-sided chest port. Prior median sternotomy and TAVR. Stable cardiomegaly. Atherosclerotic calcification of the aortic knob. Persistent elevation of the  right hemidiaphragm. Mild patchy opacity within the periphery of the left lung base. No pleural effusion or pneumothorax. IMPRESSION: Mild patchy opacity within the periphery of the left lung base, which may represent scarring/atelectasis versus infiltrate. Electronically Signed   By: Davina Poke D.O.   On: 11/08/2019 14:11   VAS [place] LOWER EXTREMITY VENOUS (DVT) (ONLY [facility] 7a-7p)  Result Date: 11/08/2019  Lower Venous DVTStudy Indications: Edema.  Risk Factors: None identified. Limitations: Body habitus and poor ultrasound/tissue interface. Comparison Study: No prior studies. Performing Technologist: Oliver Hum RVT  Examination Guidelines: A complete evaluation includes B-mode imaging, spectral Doppler, color Doppler, and power Doppler as needed of all accessible portions of each vessel. Bilateral testing is considered an integral part of a complete examination. Limited examinations for reoccurring indications may be performed as noted. The reflux portion of  the exam is performed with the patient in reverse Trendelenburg.  +-----+---------------+---------+-----------+----------+--------------+ RIGHTCompressibilityPhasicitySpontaneityPropertiesThrombus Aging +-----+---------------+---------+-----------+----------+--------------+ CFV  Full           Yes      Yes                                 +-----+---------------+---------+-----------+----------+--------------+   +---------+---------------+---------+-----------+----------+--------------+ LEFT     CompressibilityPhasicitySpontaneityPropertiesThrombus Aging +---------+---------------+---------+-----------+----------+--------------+ CFV      Full           Yes      Yes                                 +---------+---------------+---------+-----------+----------+--------------+ SFJ      Full                                                        +---------+---------------+---------+-----------+----------+--------------+  FV Prox  Full                                                        +---------+---------------+---------+-----------+----------+--------------+ FV Mid   Full                                                        +---------+---------------+---------+-----------+----------+--------------+ FV Distal               Yes      Yes                                 +---------+---------------+---------+-----------+----------+--------------+ PFV      Full                                                        +---------+---------------+---------+-----------+----------+--------------+ POP      Full           Yes      Yes                                 +---------+---------------+---------+-----------+----------+--------------+ PTV      Full                                                        +---------+---------------+---------+-----------+----------+--------------+ PERO     Full                                                        +---------+---------------+---------+-----------+----------+--------------+  Summary: RIGHT: - No evidence of common femoral vein obstruction.  LEFT: - There is no evidence of deep vein thrombosis in the lower extremity. However, portions of this examination were limited- see technologist comments above.  - No cystic structure found in the popliteal fossa.  *See table(s) above for measurements and observations. Electronically signed by Deitra Mayo MD on 11/08/2019 at 4:27:33 PM.    Final     Procedures Procedures (including critical care time)  Medications Ordered in ED Medications  potassium chloride SA (KLOR-CON) CR tablet 40 mEq (has no administration in time range)    ED Course  I have reviewed the triage vital signs and the nursing notes.  Pertinent labs & imaging results that were available during my care of the patient were reviewed by me and considered in my medical decision making (see chart for details).     MDM Rules/Calculators/A&P                          Patient received her last chemotherapy infusion on 11/01/2019 for her stage IV renal cell carcinoma.  Since then, she has been feeling generalized muscle weakness.  She also states that she has been experiencing mildly diminished appetite and more increased difficulty with ambulation and typical activities of daily living.  While she has been having intermittent episodes of nausea, this has been consistent with her onset of chemotherapy.  She also has been having diarrhea recently, but has been effectively treated with Lomotil by her oncologist.    Labs Troponin: 23 >> 22. CBC with differential: Mild anemia with hemoglobin of 11.6, consistent with baseline.  Macrocytic. UA: No evidence of infection.  Rare bacteria, 0-5 WBC.  Nitrite negative. Urine culture: Pending. CMP: Hypokalemia to 3.0.  Worse when compared to baseline.  Will replenish with 40 mEq K-Dur here in the ED and encouraged her to follow-up with her primary care provider or oncologist.  She takes furosemide.  Otherwise labs are largely unremarkable.  Imaging DG chest is personally reviewed and demonstrates mild patchy opacity within the periphery of the left lung base which may represent atelectasis/scarring versus infiltrate.  She denies any cough or fevers at home.  Laboratory work-up reassuring.  Lower suspicion for pneumonia. DVT study is obtained of left leg given worsening edema and swelling that is asymmetric when compared to right.  She states this began approximately 1 month ago.  Imaging is reviewed and without any obvious evidence of clot.  Patient is having generalized weakness complaints since her most recent chemotherapy infusion 1 week ago.  Suspect it is related to her chemotherapy and stage IV cancer.  I discussed case with Dr. Ronnald Nian who personally evaluated patient and he agrees that there is likely no pneumonia or other acute cause for her weakness aside from her  chemotherapy.  Perhaps her hypokalemia, which we have replenished here in the ED.  She will need to have her labs rechecked by her PCP/oncologist.  They may also wish to discuss her furosemide use as she may benefit from potassium sparing diuretic instead.  We have also placed an order for face-to-face evaluation as well as case management for home health needs.  Suspect that she may require a more sustainable long-term plan given her progressive weakness and difficulty with ADLs.  Patient is an Therapist, sports and agrees that all she really needs is placement with SNF given her progressing weakness.  She will follow up with her PCP and oncologist.  All of the evaluation and work-up results were discussed with the patient and any family at bedside.  Patient and/or family were informed that while patient is appropriate for discharge at this time, some medical emergencies may only develop or become detectable after a period of time.  I specifically instructed patient and/or family to return to return to the ED or seek immediate medical attention for any new or worsening symptoms.  They were provided opportunity to ask any additional questions and have none at this time.  Prior to discharge patient is feeling well, agreeable with plan for discharge home.  They have expressed understanding of verbal discharge instructions as well as return precautions and are agreeable to the plan.    Final Clinical Impression(s) / ED Diagnoses Final diagnoses:  Weakness    Rx / DC Orders ED Discharge Orders         Ordered    potassium chloride (KLOR-CON) 10 MEQ tablet  Daily        11/08/19 1649           Corena Herter, PA-C 11/08/19 1649    Lennice Sites, DO 11/09/19 1516

## 2019-11-08 NOTE — ED Provider Notes (Signed)
Medical screening examination/treatment/procedure(s) were conducted as a shared visit with non-physician practitioner(s) and myself.  I personally evaluated the patient during the encounter. Briefly, the patient is a 79 y.o. female history of renal cancer and chemotherapy who presents the ED with generalized weakness.  Normal vitals.  No fever.  Has some left greater than right leg swelling which she states is new.  Does not have any cough, shortness of breath, chest pain, abdominal pain.  Overall failure to thrive symptoms but she states that she feels that way after chemotherapy.  Chest x-ray overall unremarkable.  No white count, no cough, no sputum production.  Doubt pneumonia.  No significant anemia, electrolyte abnormality, kidney injury.  Creatinine at baseline.  DVT study negative.  Overall suspect generalized weakness likely in the setting of chemotherapy.  We will try to reach out to social work and case management for further supportive care at home as patient lives with her daughter at home who is a primary caregiver.  This chart was dictated using voice recognition software.  Despite best efforts to proofread,  errors can occur which can change the documentation meaning.     EKG Interpretation  Date/Time:  Friday November 08 2019 12:28:52 EDT Ventricular Rate:  69 PR Interval:    QRS Duration: 110 QT Interval:  497 QTC Calculation: 533 R Axis:   86 Text Interpretation: Sinus rhythm Atrial premature complex Probable anterior infarct, age indeterminate Prolonged QT interval Confirmed by Lennice Sites [phone]) on 11/08/2019 1:50:57 PM           Lennice Sites, DO 11/08/19 1429

## 2019-11-20 ENCOUNTER — Telehealth: Payer: Self-pay

## 2019-11-20 ENCOUNTER — Encounter: Payer: Self-pay | Admitting: Oncology

## 2019-11-20 NOTE — Telephone Encounter (Signed)
Called patient's daughter. She stated she has contacted Dr. Fayrene Fearing office and they are working on getting the patient scheduled for a visit.

## 2019-11-20 NOTE — Telephone Encounter (Signed)
-----   Message from Wyatt Portela, MD sent at 11/20/2019 10:38 AM EST ----- Regarding: RE: FYI No additional input. Thanks ----- Message ----- From: Tami Lin, RN Sent: 11/20/2019  10:29 AM EST To: Wyatt Portela, MD Subject: Juluis Rainier                                            Patient's daughter called and states patient has left hand and left lower extremity swelling that has been going on for awhile per daughter. Patient was seen by Dr. Melford Aase on 11/3 and his note states:   All other chronic conditions are stable and she will continue taking all other medications as prescribed.  - I placed a referral to home health nursing today. I recommended she continue with physical therapy. - I discontinued her furosemide today. I instead started her on Spironolactone 25 mg daily. - If conditions worsen or fail to improve with conservative management, she was advised to contact the office or report to the emergency department as needed.  Follow up if symptoms worsen or fail to improve.  I told patient's daughter what Dr. Fayrene Fearing note said and she said she still wanted to let you know and see if you recommend anything different.  Lanelle Bal

## 2019-11-21 ENCOUNTER — Telehealth: Payer: Self-pay | Admitting: Oncology

## 2019-11-21 ENCOUNTER — Other Ambulatory Visit: Payer: Self-pay | Admitting: *Deleted

## 2019-11-21 DIAGNOSIS — C641 Malignant neoplasm of right kidney, except renal pelvis: Secondary | ICD-10-CM

## 2019-11-21 NOTE — Telephone Encounter (Signed)
Called pt per 11/11 sch msg - unable to reach pt . Left message for patient with appt date and time

## 2019-11-22 ENCOUNTER — Inpatient Hospital Stay: Payer: Medicare Other

## 2019-11-22 ENCOUNTER — Other Ambulatory Visit: Payer: Self-pay

## 2019-11-22 ENCOUNTER — Inpatient Hospital Stay: Payer: Medicare Other | Attending: Oncology

## 2019-11-22 ENCOUNTER — Inpatient Hospital Stay: Payer: Medicare Other | Admitting: Nutrition

## 2019-11-22 ENCOUNTER — Ambulatory Visit (HOSPITAL_COMMUNITY)
Admission: RE | Admit: 2019-11-22 | Discharge: 2019-11-22 | Disposition: A | Discharge status: Home / Self Care | Payer: Medicare Other | Source: Ambulatory Visit | Attending: Oncology | Admitting: Oncology

## 2019-11-22 ENCOUNTER — Inpatient Hospital Stay (HOSPITAL_BASED_OUTPATIENT_CLINIC_OR_DEPARTMENT_OTHER): Payer: Medicare Other | Admitting: Oncology

## 2019-11-22 VITALS — BP 102/56 | HR 56 | Temp 96.4°F | Resp 18 | Ht 67.0 in | Wt 189.2 lb

## 2019-11-22 DIAGNOSIS — Z7982 Long term (current) use of aspirin: Secondary | ICD-10-CM | POA: Insufficient documentation

## 2019-11-22 DIAGNOSIS — Z95828 Presence of other vascular implants and grafts: Secondary | ICD-10-CM

## 2019-11-22 DIAGNOSIS — C641 Malignant neoplasm of right kidney, except renal pelvis: Secondary | ICD-10-CM | POA: Diagnosis not present

## 2019-11-22 DIAGNOSIS — K529 Noninfective gastroenteritis and colitis, unspecified: Secondary | ICD-10-CM | POA: Diagnosis not present

## 2019-11-22 DIAGNOSIS — C649 Malignant neoplasm of unspecified kidney, except renal pelvis: Secondary | ICD-10-CM

## 2019-11-22 DIAGNOSIS — R590 Localized enlarged lymph nodes: Secondary | ICD-10-CM | POA: Diagnosis not present

## 2019-11-22 DIAGNOSIS — E039 Hypothyroidism, unspecified: Secondary | ICD-10-CM | POA: Diagnosis not present

## 2019-11-22 DIAGNOSIS — Z79899 Other long term (current) drug therapy: Secondary | ICD-10-CM | POA: Insufficient documentation

## 2019-11-22 DIAGNOSIS — R601 Generalized edema: Secondary | ICD-10-CM | POA: Insufficient documentation

## 2019-11-22 DIAGNOSIS — I959 Hypotension, unspecified: Secondary | ICD-10-CM | POA: Diagnosis not present

## 2019-11-22 DIAGNOSIS — Z905 Acquired absence of kidney: Secondary | ICD-10-CM | POA: Insufficient documentation

## 2019-11-22 DIAGNOSIS — R911 Solitary pulmonary nodule: Secondary | ICD-10-CM | POA: Diagnosis not present

## 2019-11-22 LAB — CMP (CANCER CENTER ONLY)
ALT: 37 U/L (ref 0–44)
AST: 25 U/L (ref 15–41)
Albumin: 2.4 g/dL — ABNORMAL LOW (ref 3.5–5.0)
Alkaline Phosphatase: 66 U/L (ref 38–126)
Anion gap: 7 (ref 5–15)
BUN: 11 mg/dL (ref 8–23)
CO2: 21 mmol/L — ABNORMAL LOW (ref 22–32)
Calcium: 8.4 mg/dL — ABNORMAL LOW (ref 8.9–10.3)
Chloride: 105 mmol/L (ref 98–111)
Creatinine: 1.13 mg/dL — ABNORMAL HIGH (ref 0.44–1.00)
GFR, Estimated: 50 mL/min — ABNORMAL LOW (ref >60)
Glucose, Bld: 102 mg/dL — ABNORMAL HIGH (ref 70–99)
Potassium: 3.8 mmol/L (ref 3.5–5.1)
Sodium: 133 mmol/L — ABNORMAL LOW (ref 135–145)
Total Bilirubin: 1 mg/dL (ref 0.3–1.2)
Total Protein: 5.3 g/dL — ABNORMAL LOW (ref 6.5–8.1)

## 2019-11-22 LAB — CBC WITH DIFFERENTIAL (CANCER CENTER ONLY)
Abs Immature Granulocytes: 0.05 10*3/uL (ref 0.00–0.07)
Basophils Absolute: 0 10*3/uL (ref 0.0–0.1)
Basophils Relative: 0 %
Eosinophils Absolute: 0 10*3/uL (ref 0.0–0.5)
Eosinophils Relative: 0 %
HCT: 37.4 % (ref 36.0–46.0)
Hemoglobin: 12.5 g/dL (ref 12.0–15.0)
Immature Granulocytes: 1 %
Lymphocytes Relative: 26 %
Lymphs Abs: 2.2 10*3/uL (ref 0.7–4.0)
MCH: 34 pg (ref 26.0–34.0)
MCHC: 33.4 g/dL (ref 30.0–36.0)
MCV: 101.6 fL — ABNORMAL HIGH (ref 80.0–100.0)
Monocytes Absolute: 0.8 10*3/uL (ref 0.1–1.0)
Monocytes Relative: 9 %
Neutro Abs: 5.6 10*3/uL (ref 1.7–7.7)
Neutrophils Relative %: 64 %
Platelet Count: 78 10*3/uL — ABNORMAL LOW (ref 150–400)
RBC: 3.68 MIL/uL — ABNORMAL LOW (ref 3.87–5.11)
RDW: 13.9 % (ref 11.5–15.5)
WBC Count: 8.6 10*3/uL (ref 4.0–10.5)
nRBC: 0 % (ref 0.0–0.2)

## 2019-11-22 LAB — TSH: TSH: 3.882 u[IU]/mL (ref 0.308–3.960)

## 2019-11-22 MED ORDER — HEPARIN SOD (PORK) LOCK FLUSH 100 UNIT/ML IV SOLN
INTRAVENOUS | Status: AC
  Filled 2019-11-22: qty 5

## 2019-11-22 MED ORDER — SODIUM CHLORIDE 0.9% FLUSH
10.0000 mL | Freq: Once | INTRAVENOUS | Status: AC
  Administered 2019-11-22: 10 mL
  Filled 2019-11-22: qty 10

## 2019-11-22 NOTE — Progress Notes (Signed)
Nutrition follow-up completed with patient diagnosed with renal cell carcinoma status post right nephrectomy.  Current therapy: Pembrolizumab 200 mg every 3 weeks resumed on October 11, 2019.  She has not received any treatment since that time and currently on treatment break  Weight was documented as 189.2 pounds on November 12. This is decreased from 207 pounds May 28. This is a 9% weight loss in less than 6 months. This is significant. Patient has diffuse anasarca with increased upper and lower extremity swelling. She reports she is very weak and is fatigued. She does not have energy to walk or even turn over in bed. Reports she has a very poor appetite but does try to eat small amounts. Reports 1-3 bowel movements daily. She reports these as diarrhea. She has a history of vomiting but so far that has resolved. She did not like Ensure or boost supplements which were provided at last visit.  Labs noted: Sodium 133, glucose 102, BUN 11, creatinine 1.13, albumin 2.4.  Nutrition diagnosis: Unintended weight loss continues.  Estimated nutrition needs: 1950-2150 cal, 105-115 g protein, greater than 2 L fluid.  Severe malnutrition related to renal cell carcinoma as evidenced by percent weight loss, less than 75% energy intake for greater than 1 month and depletion of body fat and muscle mass on physical exam as well as fluid accumulation.  Intervention: Educated on importance of antiemetics to help control nausea. Recommended small frequent meals and snacks with adequate calories and protein. Continue low fiber diet secondary to diarrhea. Recommend she discuss medication with MD at today's visit. Suggested she could try Banatrol for diarrhea and loose stools. Provided samples of clear oral nutrition supplements and encouraged twice daily. I provided multiple fact sheets. Questions were answered. Teach back method used. Contact information provided. Daughter was encouraged to contact me if some of  the strategies do not help patient to increase oral intake.  Monitoring, evaluation, goals: Patient will tolerate increased calories and protein to minimize further weight loss and improve quality of life.  Next visit: To be scheduled as needed.  **Disclaimer: This note was dictated with voice recognition software. Similar sounding words can inadvertently be transcribed and this note may contain transcription errors which may not have been corrected upon publication of note.**

## 2019-11-22 NOTE — Progress Notes (Signed)
Hematology and Oncology Follow Up Visit  [name] [id number] [birth year] 79 y.o. 11/22/2019 9:13 AM [name], MDBadger, [name], MD   Principle Diagnosis: 79 year old woman with renal cell carcinoma diagnosed in 2019.  She developed stage IV clear-cell    Prior Therapy: She underwent right laparoscopic radical nephrectomy completed by Dr. Alinda Money on December 28, 2017.  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  The final pathological stage was T1b.   Pembrolizumab 200 mg every 3 weeks started on April 25, 2019.  She completed 5 cycles of therapy with axitinib 5 mg daily with the last cycle given on July 26, 2019.    Current therapy: Pembrolizumab 200 mg every 3 weeks resumed on October 11, 2019.  She has not received any treatment since that time and currently on treatment break.  Interim History: Ms. Tiggs returns today for a repeat evaluation.  Since her last visit, she reports steady decline in her overall performance status and quality of life.  She has reported diffuse anasarca with increased upper and lower extremity swelling in the interim.  She was seen in the emergency department on October 29 and her work-up has been unrevealing.  She did not have any lower extremity deep vein thrombosis and that no worsening kidney function.  Her protein and albumin has been low.   She has reported poor nutritional status and of lost a lot of muscle mass and has been quite debilitated and unable to perform most activities of daily living.  She denies any worsening pain.  She denies any nausea or vomiting but does report loose bowel habits at times.        Medications: Updated on review. Current Outpatient Medications  Medication Sig Dispense Refill  . acetaminophen (TYLENOL) 500 MG tablet Take 1,000 mg by mouth every 6 (six) hours as needed for moderate pain or headache.     . ALPRAZolam (XANAX) 0.25 MG tablet  Take 0.25 mg by mouth 3 (three) times daily as needed for anxiety.  0  . amLODipine (NORVASC) 10 MG tablet Take 1 tablet (10 mg total) by mouth daily. 90 tablet 1  . aspirin EC 81 MG tablet Take 81 mg by mouth at bedtime.     . calcium elemental as carbonate (BARIATRIC TUMS ULTRA) 400 MG chewable tablet Chew 1,000 mg by mouth 3 (three) times daily as needed for heartburn.    . clobetasol ointment (TEMOVATE) 6.28 % Apply 1 application topically 2 (two) times daily as needed (Rash).     . diclofenac Sodium (VOLTAREN) 1 % GEL Apply 4 g topically 3 (three) times daily as needed (Right knee pain).     [name] diphenoxylate-atropine (LOMOTIL) 2.5-0.025 MG tablet Take 1 tablet by mouth 4 (four) times daily as needed for diarrhea or loose stools. 30 tablet 0  . FLUoxetine (PROZAC) 40 MG capsule Take 40 mg by mouth every evening.    . furosemide (LASIX) 20 MG tablet Take 20 mg by mouth daily as needed for edema.    . INLYTA 5 MG tablet TAKE 1 TABLET (5 MG TOTAL) BY MOUTH 2 (TWO) TIMES DAILY. (Patient taking differently: Take 5 mg by mouth once. Ppt taking 1 tablet daily) 60 tablet 0  . irbesartan (AVAPRO) 300 MG tablet TAKE 1 TABLET(300 MG) BY MOUTH DAILY 90 tablet 1  . levothyroxine (SYNTHROID) 100 MCG tablet Take 1 tablet (100 mcg total) by mouth daily before breakfast. 90  tablet 3  . loperamide (IMODIUM) 2 MG capsule Take 2-4 mg by mouth 4 (four) times daily as needed for diarrhea or loose stools.     . magic mouthwash SOLN Take 5 mLs by mouth.    . magic mouthwash w/lidocaine SOLN Take 5 mLs by mouth 4 (four) times daily as needed for mouth pain. Swish and spit 240 mL 0  . Nebivolol HCl (BYSTOLIC) 20 MG TABS Take 1 tablet (20 mg total) by mouth daily. 30 tablet 11  . nitroGLYCERIN (NITROSTAT) 0.4 MG SL tablet Place 0.4 mg under the tongue every 5 (five) minutes as needed for chest pain (MAX 3 TABLETS).    [name] nystatin (MYCOSTATIN/NYSTOP) powder Apply 1 application topically 3 (three) times daily.     .  pantoprazole (PROTONIX) 40 MG tablet TAKE 1 TABLET BY MOUTH EVERY DAY 90 tablet 0  . potassium chloride (KLOR-CON) 10 MEQ tablet Take 1 tablet (10 mEq total) by mouth daily. 7 tablet 0  . PROAIR HFA 108 (90 Base) MCG/ACT inhaler Inhale 2 puffs into the lungs every 4 (four) hours as needed for shortness of breath or wheezing.  2  . Probiotic Product (PROBIOTIC PO) Take 1 capsule by mouth daily.    . prochlorperazine (COMPAZINE) 10 MG tablet TAKE 1 TABLET(10 MG) BY MOUTH EVERY 6 HOURS AS NEEDED FOR NAUSEA OR VOMITING 30 tablet 3  . simethicone (MYLICON) 811 MG chewable tablet Chew 125 mg by mouth every 6 (six) hours as needed for flatulence.    . simvastatin (ZOCOR) 40 MG tablet Take 1 tablet (40 mg total) by mouth at bedtime. 90 tablet 3  . SYMBICORT 160-4.5 MCG/ACT inhaler Inhale 1 puff into the lungs 2 (two) times daily as needed (for respiratory issues.).   2   No current facility-administered medications for this visit.     Allergies:  Allergies  Allergen Reactions  . Chantix [Varenicline Tartrate] Hives  . Dipyridamole Hives and Rash      Physical Exam:    Blood pressure (!) 102/56, pulse (!) 56, temperature (!) 96.4 F (35.8 C), temperature source Tympanic, resp. rate 18, height _0  (1.702 m), weight 189 lb 3.2 oz (85.8 kg), SpO2 100 %.       ECOG: 1     General appearance: Comfortable appearing without any discomfort Head: Normocephalic without any trauma Oropharynx: Mucous membranes are moist and pink without any thrush or ulcers. Eyes: Pupils are equal and round reactive to light. Lymph nodes: No cervical, supraclavicular, inguinal or axillary lymphadenopathy.   Heart:regular rate and rhythm.  S1 and S2 .  Bilateral lower extremity edema noted. Lung: Clear without any rhonchi or wheezes.  No dullness to percussion. Abdomin: Soft, nontender, nondistended with good bowel sounds.  No hepatosplenomegaly. Musculoskeletal: No joint deformity or effusion.  Full range  of motion noted. Neurological: No deficits noted on motor, sensory and deep tendon reflex exam. Skin: No petechial rash or dryness.  Appeared moist.               Lab Results: Lab Results  Component Value Date   WBC 8.9 11/08/2019   HGB 11.6 (L) 11/08/2019   HCT 34.4 (L) 11/08/2019   MCV 102.4 (H) 11/08/2019   PLT 83 (L) 11/08/2019     Chemistry      Component Value Date/Time   NA 137 11/08/2019 1259   NA 139 03/31/2017 1133   K 3.0 (L) 11/08/2019 1259   CL 105 11/08/2019 1259   CO2 22 11/08/2019  1259   BUN 14 11/08/2019 1259   BUN 17 03/31/2017 1133   CREATININE 1.11 (H) 11/08/2019 1259   CREATININE 1.04 (H) 11/01/2019 0958   CREATININE 0.62 09/26/2014 1043      Component Value Date/Time   CALCIUM 8.5 (L) 11/08/2019 1259   ALKPHOS 48 11/08/2019 1259   AST 26 11/08/2019 1259   AST 25 11/01/2019 0958   ALT 33 11/08/2019 1259   ALT 27 11/01/2019 0958   BILITOT 1.2 11/08/2019 1259   BILITOT 1.0 11/01/2019 0958      IMPRESSION: Slight increase in mild mediastinal lymphadenopathy in lateral aortic region.  Decreased size of left lower lobe pulmonary nodule.  Previous right nephrectomy. No evidence of recurrent or metastatic carcinoma within the abdomen or pelvis.  New right-sided colitis, likely infectious in etiology.  Mild sigmoid diverticulosis, without radiographic evidence of diverticulitis.  Stable 3.4 cm infrarenal abdominal aortic aneurysm. Recommend follow-up every 3 years. This recommendation follows ACR consensus guidelines: White Paper of the ACR Incidental Findings Committee II on Vascular Findings. J Am Coll Radiol 2013; 10:789-794. This recommendation follows ACR consensus guidelines: White Paper of the ACR Incidental Findings Committee    Impression and Plan:  79 year old woman with:  1.    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  He has been on treatment break  essentially since July 2021.  Imaging studies in August 2021 showed no rib progression of her disease she had decrease in her left lower pulmonary nodule and slight increase in her mediastinal adenopathy.  She is scheduled to have updating scans today.  The natural course of her disease was reviewed and treatment options were discussed at this time.  We will await the results of the CT scan to determine best course of action.  Continued active surveillance and supportive care versus resuming anticancer treatment will be debated.  For the time being I will continue to withhold treatment given her overall decline in status.  2.  IV access: Port-A-Cath continues to be in use without any issues.  3. Nausea prophylaxis: Antiemetics are available to her without any nausea or vomiting.  I recommended scheduling her Compazine around-the-clock to help with her nausea and appetite.  4.  Goals of care and prognosis: Performance status is declining although her disease burden is limited this could limit any further anticancer treatment moving forward.  Her prognosis is guarded at this time given her overall decline in her health with the incurable malignancy.  This will be determined further after the next CT scan.   5.  Immune mediated complications: I continue to educate her about these complications occluding pneumonitis, colitis and thyroid disease.   6.  Hypotension: Blood pressure is adequate at this time.   7.  Hypothyroidism: She continues to be on thyroid replacement with slight elevation in her TSH.  8.  Diffuse anasarca: Unclear etiology likely related to poor protein status with albumin at 2.4.  I discussed strategies to improve her nutritional status and also met with dietitian today to improve that.   9.  Follow-up: She will return in 1 week for follow-up evaluation.    30 minutes were spent on this encounter.  The time was dedicated to reviewing her disease status, discussing treatment  options and addressing complications related to her cancer and cancer therapy.   Zola Button, MD 11/12/20219:13 AM

## 2019-11-25 ENCOUNTER — Encounter (HOSPITAL_COMMUNITY): Payer: Self-pay

## 2019-11-25 ENCOUNTER — Other Ambulatory Visit: Payer: Self-pay

## 2019-11-25 ENCOUNTER — Emergency Department (HOSPITAL_COMMUNITY): Payer: Medicare Other

## 2019-11-25 ENCOUNTER — Encounter: Payer: Self-pay | Admitting: Oncology

## 2019-11-25 ENCOUNTER — Inpatient Hospital Stay (HOSPITAL_COMMUNITY)
Admission: EM | Admit: 2019-11-25 | Discharge: 2019-11-28 | DRG: 291 | Disposition: A | Discharge status: Hospice | Payer: Medicare Other | Attending: Internal Medicine | Admitting: Internal Medicine

## 2019-11-25 DIAGNOSIS — Z79899 Other long term (current) drug therapy: Secondary | ICD-10-CM

## 2019-11-25 DIAGNOSIS — I5033 Acute on chronic diastolic (congestive) heart failure: Secondary | ICD-10-CM | POA: Diagnosis present

## 2019-11-25 DIAGNOSIS — E039 Hypothyroidism, unspecified: Secondary | ICD-10-CM | POA: Diagnosis present

## 2019-11-25 DIAGNOSIS — F32A Depression, unspecified: Secondary | ICD-10-CM | POA: Diagnosis present

## 2019-11-25 DIAGNOSIS — Z800 Family history of malignant neoplasm of digestive organs: Secondary | ICD-10-CM

## 2019-11-25 DIAGNOSIS — Z951 Presence of aortocoronary bypass graft: Secondary | ICD-10-CM

## 2019-11-25 DIAGNOSIS — I5032 Chronic diastolic (congestive) heart failure: Secondary | ICD-10-CM | POA: Diagnosis present

## 2019-11-25 DIAGNOSIS — I714 Abdominal aortic aneurysm, without rupture: Secondary | ICD-10-CM | POA: Diagnosis present

## 2019-11-25 DIAGNOSIS — F17210 Nicotine dependence, cigarettes, uncomplicated: Secondary | ICD-10-CM | POA: Diagnosis present

## 2019-11-25 DIAGNOSIS — Z952 Presence of prosthetic heart valve: Secondary | ICD-10-CM

## 2019-11-25 DIAGNOSIS — R531 Weakness: Secondary | ICD-10-CM

## 2019-11-25 DIAGNOSIS — R627 Adult failure to thrive: Secondary | ICD-10-CM | POA: Diagnosis present

## 2019-11-25 DIAGNOSIS — C7800 Secondary malignant neoplasm of unspecified lung: Secondary | ICD-10-CM | POA: Diagnosis present

## 2019-11-25 DIAGNOSIS — Z515 Encounter for palliative care: Secondary | ICD-10-CM

## 2019-11-25 DIAGNOSIS — Z95828 Presence of other vascular implants and grafts: Secondary | ICD-10-CM

## 2019-11-25 DIAGNOSIS — D696 Thrombocytopenia, unspecified: Secondary | ICD-10-CM

## 2019-11-25 DIAGNOSIS — Z9689 Presence of other specified functional implants: Secondary | ICD-10-CM | POA: Diagnosis present

## 2019-11-25 DIAGNOSIS — E785 Hyperlipidemia, unspecified: Secondary | ICD-10-CM | POA: Diagnosis present

## 2019-11-25 DIAGNOSIS — Z79890 Hormone replacement therapy (postmenopausal): Secondary | ICD-10-CM

## 2019-11-25 DIAGNOSIS — Z20822 Contact with and (suspected) exposure to covid-19: Secondary | ICD-10-CM | POA: Diagnosis present

## 2019-11-25 DIAGNOSIS — I739 Peripheral vascular disease, unspecified: Secondary | ICD-10-CM | POA: Diagnosis present

## 2019-11-25 DIAGNOSIS — Z905 Acquired absence of kidney: Secondary | ICD-10-CM

## 2019-11-25 DIAGNOSIS — Z953 Presence of xenogenic heart valve: Secondary | ICD-10-CM

## 2019-11-25 DIAGNOSIS — Z66 Do not resuscitate: Secondary | ICD-10-CM | POA: Diagnosis present

## 2019-11-25 DIAGNOSIS — E43 Unspecified severe protein-calorie malnutrition: Secondary | ICD-10-CM | POA: Diagnosis present

## 2019-11-25 DIAGNOSIS — R601 Generalized edema: Secondary | ICD-10-CM | POA: Diagnosis not present

## 2019-11-25 DIAGNOSIS — Z85528 Personal history of other malignant neoplasm of kidney: Secondary | ICD-10-CM

## 2019-11-25 DIAGNOSIS — I110 Hypertensive heart disease with heart failure: Secondary | ICD-10-CM | POA: Diagnosis not present

## 2019-11-25 DIAGNOSIS — Z888 Allergy status to other drugs, medicaments and biological substances status: Secondary | ICD-10-CM

## 2019-11-25 DIAGNOSIS — F411 Generalized anxiety disorder: Secondary | ICD-10-CM

## 2019-11-25 DIAGNOSIS — R197 Diarrhea, unspecified: Secondary | ICD-10-CM

## 2019-11-25 DIAGNOSIS — Z823 Family history of stroke: Secondary | ICD-10-CM

## 2019-11-25 DIAGNOSIS — Z833 Family history of diabetes mellitus: Secondary | ICD-10-CM

## 2019-11-25 DIAGNOSIS — Z9221 Personal history of antineoplastic chemotherapy: Secondary | ICD-10-CM

## 2019-11-25 DIAGNOSIS — Z7951 Long term (current) use of inhaled steroids: Secondary | ICD-10-CM

## 2019-11-25 DIAGNOSIS — H9190 Unspecified hearing loss, unspecified ear: Secondary | ICD-10-CM | POA: Diagnosis present

## 2019-11-25 DIAGNOSIS — K219 Gastro-esophageal reflux disease without esophagitis: Secondary | ICD-10-CM | POA: Diagnosis present

## 2019-11-25 DIAGNOSIS — I2510 Atherosclerotic heart disease of native coronary artery without angina pectoris: Secondary | ICD-10-CM | POA: Diagnosis present

## 2019-11-25 DIAGNOSIS — J449 Chronic obstructive pulmonary disease, unspecified: Secondary | ICD-10-CM | POA: Diagnosis present

## 2019-11-25 DIAGNOSIS — Z7982 Long term (current) use of aspirin: Secondary | ICD-10-CM

## 2019-11-25 DIAGNOSIS — I350 Nonrheumatic aortic (valve) stenosis: Secondary | ICD-10-CM | POA: Diagnosis present

## 2019-11-25 DIAGNOSIS — Z6828 Body mass index (BMI) 28.0-28.9, adult: Secondary | ICD-10-CM

## 2019-11-25 DIAGNOSIS — Z9884 Bariatric surgery status: Secondary | ICD-10-CM

## 2019-11-25 DIAGNOSIS — E778 Other disorders of glycoprotein metabolism: Secondary | ICD-10-CM

## 2019-11-25 DIAGNOSIS — C649 Malignant neoplasm of unspecified kidney, except renal pelvis: Secondary | ICD-10-CM

## 2019-11-25 HISTORY — DX: Chronic obstructive pulmonary disease, unspecified: J44.9

## 2019-11-25 HISTORY — DX: Anemia, unspecified: D64.9

## 2019-11-25 HISTORY — DX: Heart failure, unspecified: I50.9

## 2019-11-25 HISTORY — DX: Cardiac murmur, unspecified: R01.1

## 2019-11-25 LAB — CBC
HCT: 34.4 % — ABNORMAL LOW (ref 36.0–46.0)
Hemoglobin: 11.6 g/dL — ABNORMAL LOW (ref 12.0–15.0)
MCH: 34.6 pg — ABNORMAL HIGH (ref 26.0–34.0)
MCHC: 33.7 g/dL (ref 30.0–36.0)
MCV: 102.7 fL — ABNORMAL HIGH (ref 80.0–100.0)
Platelets: 69 10*3/uL — ABNORMAL LOW (ref 150–400)
RBC: 3.35 MIL/uL — ABNORMAL LOW (ref 3.87–5.11)
RDW: 14.2 % (ref 11.5–15.5)
WBC: 9.1 10*3/uL (ref 4.0–10.5)
nRBC: 0 % (ref 0.0–0.2)

## 2019-11-25 LAB — COMPREHENSIVE METABOLIC PANEL
ALT: 38 U/L (ref 0–44)
AST: 25 U/L (ref 15–41)
Albumin: 2.1 g/dL — ABNORMAL LOW (ref 3.5–5.0)
Alkaline Phosphatase: 58 U/L (ref 38–126)
Anion gap: 7 (ref 5–15)
BUN: 15 mg/dL (ref 8–23)
CO2: 21 mmol/L — ABNORMAL LOW (ref 22–32)
Calcium: 8.6 mg/dL — ABNORMAL LOW (ref 8.9–10.3)
Chloride: 107 mmol/L (ref 98–111)
Creatinine, Ser: 1.13 mg/dL — ABNORMAL HIGH (ref 0.44–1.00)
GFR, Estimated: 50 mL/min — ABNORMAL LOW (ref >60)
Glucose, Bld: 90 mg/dL (ref 70–99)
Potassium: 3.6 mmol/L (ref 3.5–5.1)
Sodium: 135 mmol/L (ref 135–145)
Total Bilirubin: 1.4 mg/dL — ABNORMAL HIGH (ref 0.3–1.2)
Total Protein: 4.7 g/dL — ABNORMAL LOW (ref 6.5–8.1)

## 2019-11-25 LAB — BRAIN NATRIURETIC PEPTIDE: B Natriuretic Peptide: 688.7 pg/mL — ABNORMAL HIGH (ref 0.0–100.0)

## 2019-11-25 MED ORDER — PROCHLORPERAZINE EDISYLATE 10 MG/2ML IJ SOLN: 10.0000 mg | Freq: Four times a day (QID) | INTRAMUSCULAR | Status: DC | PRN

## 2019-11-25 MED ORDER — ACETAMINOPHEN 650 MG RE SUPP: 650.0000 mg | Freq: Four times a day (QID) | RECTAL | Status: DC | PRN

## 2019-11-25 MED ORDER — AMLODIPINE BESYLATE 10 MG PO TABS
10.0000 mg | ORAL_TABLET | Freq: Every day | ORAL | Status: DC
  Administered 2019-11-26: 10 mg via ORAL
  Filled 2019-11-25: qty 1

## 2019-11-25 MED ORDER — NEBIVOLOL HCL 10 MG PO TABS
20.0000 mg | ORAL_TABLET | Freq: Every day | ORAL | Status: DC
  Administered 2019-11-26: 20 mg via ORAL
  Filled 2019-11-25 (×2): qty 2

## 2019-11-25 MED ORDER — POTASSIUM CHLORIDE ER 10 MEQ PO TBCR
10.0000 meq | EXTENDED_RELEASE_TABLET | Freq: Every day | ORAL | Status: DC
  Administered 2019-11-26: 10 meq via ORAL
  Filled 2019-11-25 (×3): qty 1

## 2019-11-25 MED ORDER — ALBUTEROL SULFATE HFA 108 (90 BASE) MCG/ACT IN AERS: 2.0000 | INHALATION_SPRAY | RESPIRATORY_TRACT | Status: DC | PRN

## 2019-11-25 MED ORDER — FLUTICASONE FUROATE-VILANTEROL 200-25 MCG/INH IN AEPB
1.0000 | INHALATION_SPRAY | Freq: Every day | RESPIRATORY_TRACT | Status: DC
  Administered 2019-11-26 – 2019-11-28 (×3): 1 via RESPIRATORY_TRACT
  Filled 2019-11-25: qty 28

## 2019-11-25 MED ORDER — SODIUM CHLORIDE 0.9% FLUSH: 3.0000 mL | INTRAVENOUS | Status: DC | PRN

## 2019-11-25 MED ORDER — FUROSEMIDE 10 MG/ML IJ SOLN
40.0000 mg | Freq: Every day | INTRAMUSCULAR | Status: DC
  Administered 2019-11-26: 40 mg via INTRAVENOUS
  Filled 2019-11-25: qty 4

## 2019-11-25 MED ORDER — ACETAMINOPHEN 325 MG PO TABS: 650.0000 mg | ORAL_TABLET | Freq: Four times a day (QID) | ORAL | Status: DC | PRN

## 2019-11-25 MED ORDER — SODIUM CHLORIDE 0.9 % IV SOLN: 250.0000 mL | INTRAVENOUS | Status: DC | PRN

## 2019-11-25 MED ORDER — LEVOTHYROXINE SODIUM 100 MCG PO TABS
100.0000 ug | ORAL_TABLET | Freq: Every day | ORAL | Status: DC
  Administered 2019-11-26 – 2019-11-28 (×3): 100 ug via ORAL
  Filled 2019-11-25 (×3): qty 1

## 2019-11-25 MED ORDER — ASPIRIN EC 81 MG PO TBEC
81.0000 mg | DELAYED_RELEASE_TABLET | Freq: Every day | ORAL | Status: DC
  Administered 2019-11-25 – 2019-11-27 (×3): 81 mg via ORAL
  Filled 2019-11-25 (×3): qty 1

## 2019-11-25 MED ORDER — ALPRAZOLAM 0.25 MG PO TABS
0.2500 mg | ORAL_TABLET | Freq: Three times a day (TID) | ORAL | Status: DC | PRN
  Administered 2019-11-27: 0.25 mg via ORAL
  Filled 2019-11-25: qty 1

## 2019-11-25 MED ORDER — FLUOXETINE HCL 20 MG PO CAPS
40.0000 mg | ORAL_CAPSULE | Freq: Every evening | ORAL | Status: DC
  Administered 2019-11-26 – 2019-11-27 (×2): 40 mg via ORAL
  Filled 2019-11-25 (×2): qty 2

## 2019-11-25 MED ORDER — SODIUM CHLORIDE 0.9% FLUSH
3.0000 mL | Freq: Two times a day (BID) | INTRAVENOUS | Status: DC
  Administered 2019-11-26: 3 mL via INTRAVENOUS

## 2019-11-25 MED ORDER — IRBESARTAN 300 MG PO TABS
300.0000 mg | ORAL_TABLET | Freq: Every day | ORAL | Status: DC
  Administered 2019-11-26: 300 mg via ORAL
  Filled 2019-11-25: qty 1

## 2019-11-25 NOTE — ED Triage Notes (Addendum)
EMS reports from home, Hx of lung and kidney CA. Increased generalized weakness x 2 weeks per Daughter. Friday had lab work result of low Creat. Had contrast CT done Friday and diarrhea followed. Daughter states poor oral intake and failure to thrive, pt states no appetite.  BP 144/70 HR 68 RR 24 Sp02 100 RA Temp 98.0 CGB 117

## 2019-11-25 NOTE — H&P (Signed)
History and Physical    DEAN WONDER RFF:[id number] DOB: March 09, 1940 DOA: 11/25/2019  PCP: [name], MD   Patient coming from:  Home  Chief Complaint: weakness, swelling  HPI: [name] is a 79 y.o. 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  Over the last 2 to 3 weeks patient has had worsening of her symptoms of generalized weakness and difficulty ambulating or doing any activities of daily living.  Today she was walking with her family behind her when she just became very weak and had to sit down on the floor.  She was unable to get up secondary to generalized weakness.  She has been having swelling of her legs more than her baseline.  She is also been having swelling of her abdomen and hands and arms.  She feels that her legs are weak with her left leg being weaker than the right leg.  She has not had any visual change, slurred speech or drooping face.  She denies any headache, nausea, vomiting, fever, chills, chest pain, palpitations.  He has been followed by oncology and has undergone chemotherapy but it was stopped in June of this year as she was unable to tolerate it.  She does not want any aggressive treatment for the cancer at this point.  At this time patient and her family are interested in establishing hospice care for end-of-life care.  Review of Systems:  General: Reports generalized weakness. No fever, chills, weight loss, night sweats.  Denies dizziness.  Has decreased appetite HENT: Denies head trauma, headache, denies tinnitus. Denies nasal bleeding. Denies sore throat, sores in mouth.  Denies difficulty swallowing Eyes: Denies blurry vision, pain in eye, drainage.  Denies discoloration of eyes. Neck: Denies pain.  Denies swelling.  Denies pain with  movement. Cardiovascular: Denies chest pain, palpitations.  Has chronic edema.  Denies orthopnea Respiratory: Denies shortness of breath, cough.  Denies wheezing.  Denies sputum production Gastrointestinal: Denies abdominal pain, swelling.  Denies nausea, vomiting, diarrhea.  Denies melena.  Denies hematemesis. Musculoskeletal: Denies limitation of movement.  Denies deformity  Genitourinary: Denies pelvic pain.  Denies urinary frequency or hesitancy.  Denies dysuria.  Skin: Denies rash.  Denies petechiae, purpura, ecchymosis. Neurological: Denies headache.  Denies syncope.  Denies seizure activity.  Denies weakness or paresthesia.  Denies slurred speech, drooping face.  Psychiatric: Denies depression, anxiety.  Denies suicidal thoughts or ideation.  Denies hallucinations.  Past Medical History:  Diagnosis Date  . Abdominal aortic aneurysm ([place])    infrarenal diagnosed with maximum measurement at 3.3 cm  . Anemia   . Cancer ([place])   . CHF (congestive heart failure) ([place])   . COPD (chronic obstructive pulmonary disease) ([place])   . Coronary artery disease   . Depression   . Diverticulosis   . GERD (gastroesophageal reflux disease)   . Heart murmur   . History of tobacco abuse    Recurrent cigarette smoking  . Hyperlipidemia   . Hyperplastic colon polyp   . Hypertension   . Hypothyroidism   . Ischemic heart disease    had redo bypass surgery in 2001, at that time she had a free right internal mammary to the LAD, LAD endarterectomy, sequential vein graft to the OM1 and OM2 -- last catheterization was in 2008  . Leg fracture  remote right leg fracture  . Microscopic colitis   . Obesity    with gastric bypass surgery in 2004  . PAD (peripheral artery disease) (H[facility])    right iliac occlusion  . PONV (postoperative nausea and vomiting)   . Right kidney mass   . S/P CABG x 4 1985   LIMA to LAD, Sequential SVG to OM1-OM2, SVG to RCA - Dr Redmond Pulling  . S/P redo CABG x 3 2001   Free RIMA to  LAD with endarterectomy, sequential SVG to OM1-OM2 - Dr Servando Snare  . 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 ARTERIAL LINE INSERTION Right 05/09/2017   Procedure: ARTERIAL LINE  INSERTION  RIGHT RADIAL ARTERY;  Surgeon: Sherren Mocha, MD;  Location: [facility];  Service: Open Heart Surgery;  Laterality: Right;  . CARDIAC CATHETERIZATION  02/16/1999   Left heart catheterization with selective coronary  angiography, left ventricular angiography (RAO and LAO views), angiography of left internal mammary artery, and angiography of saphenous vein grafts  . CARDIAC CATHETERIZATION  10/20/2006   Est. EF of 55% -- Totally occuladed native coronary circulation -- Persistent patency of the right mammary graft and the distal LAD artery with peristent patency of the left mammary graft to the proximal LAD artery with persistant patency of the saphenous vein graft to the sequentail branches of the OM        . CARPAL TUNNEL RELEASE    . CHOLECYSTECTOMY    . COLONOSCOPY  07/12/2007   Dr.Brodie- normal cecum and rectum, diverticulosis in the sigmoid colon  . COLONOSCOPY  1991   per Dr.Brodie office visit note= hyperplastic polyp  . COLONOSCOPY N/A 09/30/2014   Dr.Rourk- noraml appearing rectal mucosa, scattered L sided diverticula, colonic mucosa has a somewhat pale friable appearence diffusely, no ulcers or erosions seen, the distal 10cm of terminal ileum appeared entirely normal. bx=benign colorectal mucosa with lymphocytic colitis  . CORONARY ARTERY BYPASS GRAFT  1985  . CORONARY ARTERY BYPASS GRAFT  03/09/1999   Redo CABG x3 --  with the free right internal mammary artey to the LAD with an LAD endarterectomy -- Sequential saphenous vein graft to OM1 and OM2                   . CYSTOSCOPY/RETROGRADE/URETEROSCOPY Right 12/21/2017    Procedure: [id number]/[id number];  Surgeon: Raynelle Bring, MD;  Location: [facility];  Service: Urology;  Laterality: Right;  . ESOPHAGOGASTRODUODENOSCOPY N/A 09/30/2014   Dr.Rourk-mild erosive reflux esophagitis s/p bariatric surgery  . EYE SURGERY     catracts  . GASTRIC BYPASS  2004  . INCISION AND DRAINAGE ABSCESS N/A 01/11/2018   Procedure: INCISION AND DRAINAGE ABSCESS;  Surgeon: Raynelle Bring, MD;  Location[facility];  Service: Urology;  Laterality: N/A;  . IR IMAGING GUIDED PORT INSERTION  05/09/2019  . LAPAROSCOPIC NEPHRECTOMY Right 12/28/2017   Procedure: LAPAROSCOPIC RADICAL NEPHRECTOMY;  Surgeon: Raynelle Bring, MD;  [facility];  Service: Urology;  Laterality: Right;  . RIGHT/LEFT HEART CATH AND CORONARY/GRAFT ANGIOGRAPHY N/A 04/04/2017   Procedure: RIGHT/LEFT HEART CATH AND CORONARY/GRAFT ANGIOGRAPHY;  Surgeon: Martinique, Peter M, MD;  Location: [facility];  Service: Cardiovascular;  Laterality: N/A;  . TEE WITHOUT CARDIOVERSION N/A 05/09/2017   Procedure: TRANSESOPHAGEAL ECHOCARDIOGRAM (TEE);  Surgeon: Sherren Mocha, MD;  Location: [facility];  Service: Open Heart Surgery;  Laterality: N/A;  . TRANSCATHETER AORTIC VALVE REPLACEMENT, TRANSFEMORAL N/A 05/09/2017   Procedure: TRANSCATHETER AORTIC VALVE REPLACEMENT, TRANSFEMORAL using a 30mm Edwards Sapien 3 Aortic Valve;  Surgeon: Sherren Mocha, MD;  Location: [facility];  Service: Open Heart Surgery;  Laterality: N/A;    Social History  reports that she has been smoking cigarettes. She started smoking about 34 years ago. She has been smoking about 0.50 packs per day. She has never used smokeless tobacco. She reports current alcohol use. She reports that she does not use drugs.  Allergies  Allergen Reactions  . Chantix [Varenicline Tartrate] Hives  . Dipyridamole Hives and Rash    Family History  Problem Relation Age of Onset  . Stroke Father   . Diabetes Mother   . Pancreatic cancer Mother      Prior to Admission medications    Medication Sig Start Date End Date Taking? Authorizing Provider  acetaminophen (TYLENOL) 500 MG tablet Take 1,000 mg by mouth every 6 (six) hours as needed for moderate pain or headache.     [provider]  ALPRAZolam Duanne Moron) 0.25 MG tablet Take 0.25 mg by mouth 3 (three) times daily as needed for anxiety. 03/20/17   [provider]  amLODipine (NORVASC) 10 MG tablet Take 1 tablet (10 mg total) by mouth daily. 03/22/19   Martinique, Peter M, MD  aspirin EC 81 MG tablet Take 81 mg by mouth at bedtime.     [provider]  calcium elemental as carbonate (BARIATRIC TUMS ULTRA) 400 MG chewable tablet Chew 1,000 mg by mouth 3 (three) times daily as needed for heartburn.    [provider]  clobetasol ointment (TEMOVATE) 7.16 % Apply 1 application topically 2 (two) times daily as needed (Rash).  04/03/19 04/02/20  [provider]  diclofenac Sodium (VOLTAREN) 1 % GEL Apply 4 g topically 3 (three) times daily as needed (Right knee pain).     [provider]  diphenoxylate-atropine (LOMOTIL) 2.5-0.025 MG tablet Take 1 tablet by mouth 4 (four) times daily as needed for diarrhea or loose stools. 10/30/19   Wyatt Portela, MD  FLUoxetine (PROZAC) 40 MG capsule Take 40 mg by mouth every evening.    [provider]  furosemide (LASIX) 20 MG tablet Take 20 mg by mouth daily as needed for edema.    [provider]  INLYTA 5 MG tablet TAKE 1 TABLET (5 MG TOTAL) BY MOUTH 2 (TWO) TIMES DAILY. Patient taking differently: Take 5 mg by mouth once. Ppt taking 1 tablet daily 07/01/19   Wyatt Portela, MD  irbesartan (AVAPRO) 300 MG tablet TAKE 1 TABLET(300 MG) BY MOUTH DAILY 07/12/19   Martinique, Peter M, MD  levothyroxine (SYNTHROID) 100 MCG tablet Take 1 tablet (100 mcg total) by mouth daily before breakfast. 09/06/19   Wyatt Portela, MD  loperamide (IMODIUM) 2 MG capsule Take 2-4 mg by mouth 4 (four) times daily as needed for diarrhea or loose stools.      [provider]  magic mouthwash SOLN Take 5 mLs by mouth.    [provider]  magic mouthwash w/lidocaine SOLN Take 5 mLs by mouth 4 (four) times daily as needed for mouth pain. Swish and spit 08/15/19   Wyatt Portela, MD  Nebivolol HCl (BYSTOLIC) 20 MG TABS Take 1 tablet (20 mg total) by mouth daily. 02/22/19   Martinique, Peter M, MD  nitroGLYCERIN (NITROSTAT) 0.4 MG SL tablet Place 0.4 mg under the tongue every 5 (five) minutes  as needed for chest pain (MAX 3 TABLETS).    [provider]  nystatin (MYCOSTATIN/NYSTOP) powder Apply 1 application topically 3 (three) times daily.  12/12/18   [provider]  pantoprazole (PROTONIX) 40 MG tablet TAKE 1 TABLET BY MOUTH EVERY DAY 09/30/19   Annitta Needs, NP  potassium chloride (KLOR-CON) 10 MEQ tablet Take 1 tablet (10 mEq total) by mouth daily. 11/08/19   Corena Herter, PA-C  PROAIR HFA 108 (90 Base) MCG/ACT inhaler Inhale 2 puffs into the lungs every 4 (four) hours as needed for shortness of breath or wheezing. 03/14/17   [provider]  Probiotic Product (PROBIOTIC PO) Take 1 capsule by mouth daily.    [provider]  prochlorperazine (COMPAZINE) 10 MG tablet TAKE 1 TABLET(10 MG) BY MOUTH EVERY 6 HOURS AS NEEDED FOR NAUSEA OR VOMITING 10/16/19   Wyatt Portela, MD  simethicone (MYLICON) 544 MG chewable tablet Chew 125 mg by mouth every 6 (six) hours as needed for flatulence.    [provider]  simvastatin (ZOCOR) 40 MG tablet Take 1 tablet (40 mg total) by mouth at bedtime. 03/18/19   Martinique, Peter M, MD  SYMBICORT 160-4.5 MCG/ACT inhaler Inhale 1 puff into the lungs 2 (two) times daily as needed (for respiratory issues.).  03/14/17   [provider]    Physical Exam: Vitals:   11/25/19 2130 11/25/19 2200 11/25/19 2230 11/25/19 2259  BP: 125/76 112/65 (!) 113/56 99/69  Pulse: 61 68 68 72  Resp: (!) 26 18 17 18   Temp:    97.7 F (36.5 C)  TempSrc:    Oral  SpO2: 100% 98% 98%  99%    Constitutional: NAD, calm, comfortable Vitals:   11/25/19 2130 11/25/19 2200 11/25/19 2230 11/25/19 2259  BP: 125/76 112/65 (!) 113/56 99/69  Pulse: 61 68 68 72  Resp: (!) 26 18 17 18   Temp:    97.7 F (36.5 C)  TempSrc:    Oral  SpO2: 100% 98% 98% 99%   General: Alert and oriented x3. Chronically ill appearing, tired appearing.  Eyes: EOMI, PERRL, conjunctivae normal.  Sclera nonicteric HENT:  [place]/AT, external ears normal. Nares patent without epistasis. Mucous membranes are dry. Posterior pharynx without lesions. Neck: Soft, normal range of motion, supple, no masses,  Trachea midline Respiratory:  Equal breath sounds with mild expiratory wheezing, no crackles. Normal respiratory effort. No accessory muscle use.  Cardiovascular: Regular rate and rhythm, has 3/6 murmur. No rubs / gallops. Has lower and upper extremity edema.   Abdomen: Soft, no tenderness, edema of abdominal wall. no rebound or guarding.  No masses palpated. Bowel sounds normoactive Musculoskeletal: FROM. no cyanosis. Normal muscle tone.  Skin: Warm, dry, intact no rashes, lesions, ulcers. No induration Neurologic: CN 2-12 grossly intact.  Normal speech.  Sensation intact  Psychiatric: Normal judgment and insight.  Normal mood.    Labs on Admission: I have personally reviewed following labs and imaging studies  CBC: Recent Labs  Lab 11/22/19 1030 11/25/19 1958  WBC 8.6 9.1  NEUTROABS 5.6  --   HGB 12.5 11.6*  HCT 37.4 34.4*  MCV 101.6* 102.7*  PLT 78* 69*    Basic Metabolic Panel: Recent Labs  Lab 11/22/19 1030 11/25/19 1958  NA 133* 135  K 3.8 3.6  CL 105 107  CO2 21* 21*  GLUCOSE 102* 90  BUN 11 15  CREATININE 1.13* 1.13*  CALCIUM 8.4* 8.6*    GFR: Estimated Creatinine Clearance: 46.9 mL/min (A) (  by C-G formula based on SCr of 1.13 mg/dL (H)).  Liver Function Tests: Recent Labs  Lab 11/22/19 1030 11/25/19 1958  AST 25 25  ALT 37 38  ALKPHOS 66 58  BILITOT 1.0 1.4*  PROT  5.3* 4.7*  ALBUMIN 2.4* 2.1*    Urine analysis:    Component Value Date/Time   COLORURINE STRAW (A) 11/08/2019 1419   APPEARANCEUR CLEAR 11/08/2019 1419   LABSPEC 1.003 (L) 11/08/2019 1419   PHURINE 6.0 11/08/2019 1419   GLUCOSEU NEGATIVE 11/08/2019 1419   HGBUR NEGATIVE 11/08/2019 1419   Major 11/08/2019 1419   Ross 11/08/2019 1419   PROTEINUR NEGATIVE 11/08/2019 1419   UROBILINOGEN 0.2 10/24/2007 1052   NITRITE NEGATIVE 11/08/2019 1419   LEUKOCYTESUR MODERATE (A) 11/08/2019 1419    Radiological Exams on Admission: CT HEAD WO CONTRAST  Result Date: 11/25/2019 CLINICAL DATA:  Generalized weakness for 2 weeks. Decreased oral intake. EXAM: CT HEAD WITHOUT CONTRAST TECHNIQUE: Contiguous axial images were obtained from the base of the skull through the vertex without intravenous contrast. COMPARISON:  06/02/2019 FINDINGS: Brain: No evidence of acute infarction, hemorrhage, hydrocephalus, extra-axial collection or mass lesion/mass effect. Mild prominence of the sulci. Vascular: No hyperdense vessel or unexpected calcification. Skull: Normal. Negative for fracture or focal lesion. Sinuses/Orbits: No acute finding. Other: None IMPRESSION: 1. No acute intracranial abnormalities. 2. Mild brain atrophy. Electronically Signed   By: Kerby Moors M.D.   On: 11/25/2019 19:36    EKG: Independently reviewed.  EKG is reviewed and shows normal sinus rhythm with occasional PAC.  No acute ST elevation or depression.  Prolonged QT interval with a QTC of 548  Assessment/Plan Principal Problem:   Chronic diastolic CHF (congestive heart failure) ([place]) [name] will be admitted to cardiac telemetry floor.  Diuresis with Lasix.  Monitor I&O's.  Potassium will be supplemented. Monitor electrolytes and renal function.  Active Problems:   Generalized weakness  secondary to chronic medical conditions and known cancer.  Patient does not want aggressive therapy at this time.   Patient wishes to establish hospice care    Failure to thrive in adult Secondary to medical condition and metastatic cancer to lung. Has had rapid decline in functional ability over the past two weeks.     Anasarca Lasix for diuresis overnight.     Metastatic renal cell carcinoma to lung [facility]) Followed by Oncology and has stopped all therapies.  Pt and family want to pursue Hospice at this time.  Consult hospice in morning to establish services    Thrombocytopenia (HCC) Recheck CBC in am. No anticoagulation given with platelet count of 69,000    Prolonged QT interval Avoid medications that can further prolong QT interval.     S/P TAVR (transcatheter aortic valve replacement)   Port-A-Cath in place stable      DVT prophylaxis: SCDs for DVT prophylaxis Code Status:   DNR  Family Communication:   Disposition Plan:   Patient is from:  Home  Anticipated DC to:  Home with home hospice per family and patient request.  Anticipated DC date:  Anticipate 2 midnight stay to allow for establishment of hospice services  Consults called:  Palliative Care/Hospice consult.  Admission status:  Inpatient.   Yevonne Aline Khadeja Abt MD [facility]  How to contact the [facility] Attending or Consulting provider [place] or covering provider during after hours [place], for this patient?   1. Check the care team in [facility] and look for a) attending/consulting [facility] provider  listed and b) the [facility] team listed 2. Log into [email or web address] and use [place]'s universal password to access. If you do not have the password, please contact the hospital operator. 3. Locate the [facility] provider you are looking for under [facility] and page to a number that you can be directly reached. 4. If you still have difficulty reaching the provider, please page the [facility] (Director on Call) for the Hospitalists listed on amion for assistance.  11/25/2019, 11:05 PM

## 2019-11-25 NOTE — ED Provider Notes (Addendum)
[facility] Emergency Department Provider Note MRN:  [id number]  Arrival date & time: 11/25/19     Chief Complaint   Failure To Thrive and Fatigue   History of Present Illness   [name] is a 79 y.o. year-old female with a history of CAD, renal cell carcinoma presenting to the ED with chief complaint of failure to thrive.  Patient endorsing a slow progressive worsening of her functional abilities over the past several weeks.  Today she is endorsing inability to pivot with her left leg, which she first noticed at 10 or 11 AM.  Feels that her left leg is weaker than her right and feels that this is a new problem.  Denies any changes to her arm strength.  Denies headache or vision change or vomiting, no fever or cough, no chest pain or shortness of breath.  Review of Systems  A complete 10 system review of systems was obtained and all systems are negative except as noted in the HPI and PMH.   Patient's Health History    Past Medical History:  Diagnosis Date  . Abdominal aortic aneurysm ([place])    infrarenal diagnosed with maximum measurement at 3.3 cm  . Cancer ([place])   . Coronary artery disease   . Depression   . Diverticulosis   . GERD (gastroesophageal reflux disease)   . History of tobacco abuse    Recurrent cigarette smoking  . Hyperlipidemia   . Hyperplastic colon polyp   . Hypertension   . Hypothyroidism   . Ischemic heart disease    had redo bypass surgery in 2001, at that time she had a free right internal mammary to the LAD, LAD endarterectomy, sequential vein graft to the OM1 and OM2 -- last catheterization was in 2008  . Leg fracture    remote right leg fracture  . Microscopic colitis   . Obesity    with gastric bypass surgery in 2004  . PAD (peripheral artery disease) (HCC)    right iliac occlusion  . PONV (postoperative nausea and vomiting)   . Right kidney mass   . S/P CABG x 4 1985   LIMA to LAD, Sequential SVG to OM1-OM2, SVG to  RCA - Dr Redmond Pulling  . S/P redo CABG x 3 2001   Free RIMA to LAD with endarterectomy, sequential SVG to OM1-OM2 - Dr Servando Snare  . 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 ARTERIAL LINE INSERTION Right 05/09/2017   Procedure: ARTERIAL LINE  INSERTION  RIGHT RADIAL ARTERY;  Surgeon: Sherren Mocha, MD;  Location: [facility];  Service: Open Heart Surgery;  Laterality: Right;  . CARDIAC CATHETERIZATION  02/16/1999   Left heart catheterization with selective coronary  angiography, left ventricular angiography (RAO and LAO views), angiography of left internal mammary artery, and angiography of saphenous vein grafts  . CARDIAC CATHETERIZATION  10/20/2006   Est. EF of 55% -- Totally occuladed native coronary circulation -- Persistent patency of the right mammary graft and the distal LAD artery with peristent patency of the left mammary graft to the proximal LAD artery with persistant patency of the saphenous vein graft to the sequentail branches of the OM        . CARPAL TUNNEL RELEASE    . CHOLECYSTECTOMY    . COLONOSCOPY  07/12/2007   Dr.Brodie- normal cecum and  rectum, diverticulosis in the sigmoid colon  . COLONOSCOPY  1991   per Dr.Brodie office visit note= hyperplastic polyp  . COLONOSCOPY N/A 09/30/2014   Dr.Rourk- noraml appearing rectal mucosa, scattered L sided diverticula, colonic mucosa has a somewhat pale friable appearence diffusely, no ulcers or erosions seen, the distal 10cm of terminal ileum appeared entirely normal. bx=benign colorectal mucosa with lymphocytic colitis  . CORONARY ARTERY BYPASS GRAFT  1985  . CORONARY ARTERY BYPASS GRAFT  03/09/1999   Redo CABG x3 --  with the free right internal mammary artey to the LAD with an LAD endarterectomy -- Sequential saphenous vein graft to OM1 and OM2                   .  CYSTOSCOPY/RETROGRADE/URETEROSCOPY Right 12/21/2017   Procedure: GNFAOZHYQM/VHQIONGEXB;  Surgeon: Raynelle Bring, MD;  Location: [facility];  Service: Urology;  Laterality: Right;  . ESOPHAGOGASTRODUODENOSCOPY N/A 09/30/2014   Dr.Rourk-mild erosive reflux esophagitis s/p bariatric surgery  . EYE SURGERY     catracts  . GASTRIC BYPASS  2004  . INCISION AND DRAINAGE ABSCESS N/A 01/11/2018   Procedure: INCISION AND DRAINAGE ABSCESS;  Surgeon: Raynelle Bring, MD;  Location[facility];  Service: Urology;  Laterality: N/A;  . IR IMAGING GUIDED PORT INSERTION  05/09/2019  . LAPAROSCOPIC NEPHRECTOMY Right 12/28/2017   Procedure: LAPAROSCOPIC RADICAL NEPHRECTOMY;  Surgeon: Raynelle Bring, MD;  Location: [facility];  Service: Urology;  Laterality: Right;  . RIGHT/LEFT HEART CATH AND CORONARY/GRAFT ANGIOGRAPHY N/A 04/04/2017   Procedure: RIGHT/LEFT HEART CATH AND CORONARY/GRAFT ANGIOGRAPHY;  Surgeon: Martinique, Peter M, MD;  Location: [facility];  Service: Cardiovascular;  Laterality: N/A;  . TEE WITHOUT CARDIOVERSION N/A 05/09/2017   Procedure: TRANSESOPHAGEAL ECHOCARDIOGRAM (TEE);  Surgeon: Sherren Mocha, MD;  Location: [facility];  Service: Open Heart Surgery;  Laterality: N/A;  . TRANSCATHETER AORTIC VALVE REPLACEMENT, TRANSFEMORAL N/A 05/09/2017   Procedure: TRANSCATHETER AORTIC VALVE REPLACEMENT, TRANSFEMORAL using a 28mm Edwards Sapien 3 Aortic Valve;  Surgeon: Sherren Mocha, MD;  Location: [facility];  Service: Open Heart Surgery;  Laterality: N/A;    Family History  Problem Relation Age of Onset  . Stroke Father   . Diabetes Mother   . Pancreatic cancer Mother     Social History   Socioeconomic History  . Marital status: Married    Spouse name: Not on file  . Number of children: Not on file  . Years of education: Not on file  . Highest education level: Not on file  Occupational History  . Occupation: reitred    Fish farm manager: [employer]  Tobacco Use  . Smoking status: Current Some Day Smoker     Packs/day: 0.50    Types: Cigarettes    Start date: 01/10/1985    Last attempt to quit: 02/11/2016    Years since quitting: 3.7  . Smokeless tobacco: Never Used  . Tobacco comment: smokes 1-2 cigarettes a day  Vaping Use  . Vaping Use: Never used  Substance and Sexual Activity  . Alcohol use: Yes    Alcohol/week: 0.0 standard drinks    Comment: rarely wine  . Drug use: No  . Sexual activity: Never  Other Topics Concern  . Not on file  Social History Narrative  . Not on file   Social Determinants of Health   Financial Resource Strain:   . Difficulty of Paying Living Expenses: Not on file  Food Insecurity:   . Worried About Charity fundraiser in the Last  Year: Not on file  . Ran Out of Food in the Last Year: Not on file  Transportation Needs:   . Lack of Transportation (Medical): Not on file  . Lack of Transportation (Non-Medical): Not on file  Physical Activity:   . Days of Exercise per Week: Not on file  . Minutes of Exercise per Session: Not on file  Stress:   . Feeling of Stress : Not on file  Social Connections:   . Frequency of Communication with Friends and Family: Not on file  . Frequency of Social Gatherings with Friends and Family: Not on file  . Attends Religious Services: Not on file  . Active Member of Clubs or Organizations: Not on file  . Attends Archivist Meetings: Not on file  . Marital Status: Not on file  Intimate Partner Violence:   . Fear of Current or Ex-Partner: Not on file  . Emotionally Abused: Not on file  . Physically Abused: Not on file  . Sexually Abused: Not on file     Physical Exam   Vitals:   11/25/19 2100 11/25/19 2130  BP: 115/67 125/76  Pulse: 67 61  Resp: 17 (!) 26  Temp:    SpO2: 98% 100%    CONSTITUTIONAL: Chronically ill-appearing, NAD NEURO:  Alert and oriented x 3, normal and symmetric strength and sensation to the upper extremities, decreased strength to the lower extremities, seems equal EYES:  eyes  equal and reactive ENT/NECK:  no LAD, no JVD CARDIO: Regular rate, well-perfused, normal S1 and S2 PULM:  CTAB no wheezing or rhonchi GI/GU:  normal bowel sounds, non-distended, non-tender MSK/SPINE:  No gross deformities, no edema SKIN:  no rash, atraumatic PSYCH:  Appropriate speech and behavior  *Additional and/or pertinent findings included in MDM below  Diagnostic and Interventional Summary    EKG Interpretation  Date/Time:  Monday November 25 2019 19:10:52 EST Ventricular Rate:  69 PR Interval:    QRS Duration: 88 QT Interval:  511 QTC Calculation: 548 R Axis:   -16 Text Interpretation: Sinus rhythm Atrial premature complex Inferior infarct, age indeterminate Anterior infarct, old Lateral leads are also involved Prolonged QT interval Confirmed by Gerlene Fee [phone]) on 11/25/2019 8:11:23 PM      Labs Reviewed  CBC - Abnormal; Notable for the following components:      Result Value   RBC 3.35 (*)    Hemoglobin 11.6 (*)    HCT 34.4 (*)    MCV 102.7 (*)    MCH 34.6 (*)    Platelets 69 (*)    All other components within normal limits  COMPREHENSIVE METABOLIC PANEL - Abnormal; Notable for the following components:   CO2 21 (*)    Creatinine, Ser 1.13 (*)    Calcium 8.6 (*)    Total Protein 4.7 (*)    Albumin 2.1 (*)    Total Bilirubin 1.4 (*)    GFR, Estimated 50 (*)    All other components within normal limits  BRAIN NATRIURETIC PEPTIDE - Abnormal; Notable for the following components:   B Natriuretic Peptide 688.7 (*)    All other components within normal limits  RESPIRATORY PANEL BY RT PCR (FLU A&B, COVID)    CT HEAD WO CONTRAST  Final Result      Medications - No data to display   Procedures  /  Critical Care .Critical Care Performed by: Maudie Flakes, MD Authorized by: Maudie Flakes, MD   Critical care provider statement:  Critical care time (minutes):  45   Critical care was necessary to treat or prevent imminent or life-threatening  deterioration of the following conditions:  Metabolic crisis (severe hypoproteinemia and end of life care discussions)   Critical care was time spent personally by me on the following activities:  Discussions with consultants, evaluation of patient's response to treatment, examination of patient, ordering and performing treatments and interventions, ordering and review of laboratory studies, ordering and review of radiographic studies, pulse oximetry, re-evaluation of patient's condition, obtaining history from patient or surrogate and review of old charts    ED Course and Medical Decision Making  I have reviewed the triage vital signs, the nursing notes, and pertinent available records from the EMR.  Listed above are laboratory and imaging tests that I personally ordered, reviewed, and interpreted and then considered in my medical decision making (see below for details).  More of a subjective decreased strength left lower extremity starting today, considering metastatic brain lesion versus stroke.  Patient also has a long progressive worsening of her functional abilities and this could be a component of that.  Awaiting CT imaging, labs, will attempt to discuss patient's goals with family.     Lengthy discussions with the patient's daughter and then again with patient with daughter at bedside.  Overall patient is likely experiencing anorexia in the setting of stage IV cancer which is causing her severe hypoproteinemia and diffuse anasarca and worsening weakness.  She is ready to transition to comfort care.  Will admit to medicine to help facilitate this transition.  Barth Kirks. [place]onia Small, [place] [email or web address]  Final Clinical Impressions(s) / ED Diagnoses     ICD-10-CM   1. Renal cell carcinoma, unspecified laterality (HCC)  C64.9   2. Hypoproteinemia (HCC)  E77.8   3. Comfort measures only status  Z51.5     ED Discharge Orders    None        Discharge Instructions Discussed with and Provided to Patient:   Discharge Instructions   None       Maudie Flakes, MD 11/25/19 2208    Maudie Flakes, MD 11/25/19 2209

## 2019-11-26 DIAGNOSIS — R601 Generalized edema: Secondary | ICD-10-CM

## 2019-11-26 DIAGNOSIS — I5033 Acute on chronic diastolic (congestive) heart failure: Secondary | ICD-10-CM

## 2019-11-26 DIAGNOSIS — I5032 Chronic diastolic (congestive) heart failure: Secondary | ICD-10-CM | POA: Diagnosis not present

## 2019-11-26 DIAGNOSIS — R531 Weakness: Secondary | ICD-10-CM

## 2019-11-26 DIAGNOSIS — C649 Malignant neoplasm of unspecified kidney, except renal pelvis: Secondary | ICD-10-CM | POA: Diagnosis not present

## 2019-11-26 DIAGNOSIS — Z952 Presence of prosthetic heart valve: Secondary | ICD-10-CM

## 2019-11-26 DIAGNOSIS — R197 Diarrhea, unspecified: Secondary | ICD-10-CM

## 2019-11-26 DIAGNOSIS — F411 Generalized anxiety disorder: Secondary | ICD-10-CM

## 2019-11-26 DIAGNOSIS — Z515 Encounter for palliative care: Secondary | ICD-10-CM

## 2019-11-26 DIAGNOSIS — C7800 Secondary malignant neoplasm of unspecified lung: Secondary | ICD-10-CM

## 2019-11-26 DIAGNOSIS — D696 Thrombocytopenia, unspecified: Secondary | ICD-10-CM

## 2019-11-26 DIAGNOSIS — R627 Adult failure to thrive: Secondary | ICD-10-CM | POA: Diagnosis not present

## 2019-11-26 LAB — BASIC METABOLIC PANEL
Anion gap: 7 (ref 5–15)
BUN: 14 mg/dL (ref 8–23)
CO2: 21 mmol/L — ABNORMAL LOW (ref 22–32)
Calcium: 8.2 mg/dL — ABNORMAL LOW (ref 8.9–10.3)
Chloride: 107 mmol/L (ref 98–111)
Creatinine, Ser: 1.09 mg/dL — ABNORMAL HIGH (ref 0.44–1.00)
GFR, Estimated: 52 mL/min — ABNORMAL LOW (ref >60)
Glucose, Bld: 92 mg/dL (ref 70–99)
Potassium: 3.5 mmol/L (ref 3.5–5.1)
Sodium: 135 mmol/L (ref 135–145)

## 2019-11-26 LAB — RESPIRATORY PANEL BY RT PCR (FLU A&B, COVID)
Influenza A by PCR: NEGATIVE
Influenza B by PCR: NEGATIVE
SARS Coronavirus 2 by RT PCR: NEGATIVE

## 2019-11-26 LAB — CBC
HCT: 32.4 % — ABNORMAL LOW (ref 36.0–46.0)
Hemoglobin: 10.8 g/dL — ABNORMAL LOW (ref 12.0–15.0)
MCH: 35.1 pg — ABNORMAL HIGH (ref 26.0–34.0)
MCHC: 33.3 g/dL (ref 30.0–36.0)
MCV: 105.2 fL — ABNORMAL HIGH (ref 80.0–100.0)
Platelets: 61 10*3/uL — ABNORMAL LOW (ref 150–400)
RBC: 3.08 MIL/uL — ABNORMAL LOW (ref 3.87–5.11)
RDW: 14.2 % (ref 11.5–15.5)
WBC: 7.1 10*3/uL (ref 4.0–10.5)
nRBC: 0 % (ref 0.0–0.2)

## 2019-11-26 MED ORDER — SODIUM CHLORIDE 0.9% FLUSH: 10.0000 mL | INTRAVENOUS | Status: DC | PRN

## 2019-11-26 MED ORDER — DIPHENOXYLATE-ATROPINE 2.5-0.025 MG PO TABS: 1.0000 | ORAL_TABLET | Freq: Four times a day (QID) | ORAL | Status: DC | PRN

## 2019-11-26 MED ORDER — PROCHLORPERAZINE MALEATE 10 MG PO TABS: 10.0000 mg | ORAL_TABLET | Freq: Four times a day (QID) | ORAL | Status: DC | PRN

## 2019-11-26 MED ORDER — ONDANSETRON 4 MG PO TBDP: 8.0000 mg | ORAL_TABLET | Freq: Three times a day (TID) | ORAL | Status: DC | PRN

## 2019-11-26 MED ORDER — SODIUM CHLORIDE 0.9% FLUSH: 10.0000 mL | Freq: Two times a day (BID) | INTRAVENOUS | Status: DC

## 2019-11-26 MED ORDER — CHLORHEXIDINE GLUCONATE CLOTH 2 % EX PADS
6.0000 | MEDICATED_PAD | Freq: Every day | CUTANEOUS | Status: DC
  Administered 2019-11-26 – 2019-11-28 (×3): 6 via TOPICAL

## 2019-11-26 MED ORDER — OXYCODONE HCL 5 MG PO TABS
5.0000 mg | ORAL_TABLET | ORAL | Status: DC | PRN
  Administered 2019-11-26 – 2019-11-28 (×4): 5 mg via ORAL
  Filled 2019-11-26 (×4): qty 1

## 2019-11-26 NOTE — TOC Initial Note (Signed)
[facility]) - Initial/Assessment Note    Patient Details  Name: [name] MRN: [id number] Date of Birth: Aug 01, 1940  Transition of [facility]) CM/SW Contact:    Joaquin Courts, RN Phone Number: 11/26/2019, 1:25 PM  Clinical Narrative:                 CM spoke with patient at bedside.  Patient reports she is interested in hospice at home services and selects [facility].  [facility] rep notified of referral.   Expected Discharge Plan: Home w Hospice Care Barriers to Discharge: Continued Medical Work up   Patient Goals and CMS Choice Patient states their goals for this hospitalization and ongoing recovery are:: to go home with hospice CMS Medicare.gov Compare Post Acute Care list provided to:: Patient Choice offered to / list presented to : Patient  Expected Discharge Plan and Services Expected Discharge Plan: [place]   Discharge Planning Services: CM Consult Post Acute Care Choice: Hospice Living arrangements for the past 2 months: Single Family Home                                      Prior Living Arrangements/Services Living arrangements for the past 2 months: Single Family Home Lives with:: Adult Children Patient language and need for interpreter reviewed:: Yes Do you feel safe going back to the place where you live?: Yes      Need for Family Participation in Patient Care: Yes (Comment) Care giver support system in place?: Yes (comment)   Criminal Activity/Legal Involvement Pertinent to Current Situation/Hospitalization: No - Comment as needed  Activities of Daily Living Home Assistive Devices/Equipment: Walker (specify type) ADL Screening (condition at time of admission) Patient's cognitive ability adequate to safely complete daily activities?: Yes Is the patient deaf or have difficulty hearing?: Yes Does the patient have difficulty seeing, even when wearing glasses/contacts?: No Does the patient have difficulty  concentrating, remembering, or making decisions?: No Patient able to express need for assistance with ADLs?: Yes Does the patient have difficulty dressing or bathing?: Yes Independently performs ADLs?: No Communication: Independent Dressing (OT): Needs assistance Is this a change from baseline?: Pre-admission baseline Grooming: Needs assistance Is this a change from baseline?: Pre-admission baseline Feeding: Independent Bathing: Needs assistance Is this a change from baseline?: Pre-admission baseline Toileting: Needs assistance Is this a change from baseline?: Pre-admission baseline In/Out Bed: Needs assistance Is this a change from baseline?: Pre-admission baseline Walks in Home: Independent with device (comment) Does the patient have difficulty walking or climbing stairs?: Yes Weakness of Legs: Both Weakness of Arms/Hands: Both  Permission Sought/Granted                  Emotional Assessment Appearance:: Appears stated age Attitude/Demeanor/Rapport: Engaged Affect (typically observed): Accepting Orientation: : Oriented to Self, Oriented to Place, Oriented to  Time, Oriented to Situation   Psych Involvement: No (comment)  Admission diagnosis:  Hypoproteinemia (HCC) [E77.8] Acute on chronic diastolic CHF (congestive heart failure) (HCC) [I50.33] Renal cell carcinoma, unspecified laterality ([place]) [C64.9] Comfort measures only status [Z51.5] Patient Active Problem List   Diagnosis Date Noted  . Generalized weakness 11/25/2019  . Failure to thrive in adult 11/25/2019  . Anasarca 11/25/2019  . 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 Thrombocytopenia ([place]) 11/25/2019  . Acute on chronic diastolic CHF (congestive heart failure) ([place]) 11/25/2019  . Port-A-Cath in place  07/26/2019  . Acute respiratory failure with hypoxia ([place]) 06/03/2019  . Acute encephalopathy 06/03/2019  . UTI (urinary tract infection) 06/02/2019  . Hypoxia 06/02/2019  . Goals of  care, counseling/discussion 04/11/2019  . Severe sepsis ([place])   . Macrocytic anemia   . Abdominal wall abscess at site of surgical wound 01/11/2018  . Hypothyroidism 01/11/2018  . Anxiety 01/11/2018  . COPD (chronic obstructive pulmonary disease) ([place]) 01/11/2018  . AKI (acute kidney injury) ([place]) 01/11/2018  . Chronic diastolic CHF (congestive heart failure) ([place]) 01/11/2018  . Renal cell carcinoma ([place]) 01/11/2018  . Neoplasm of right kidney 12/28/2017  . Severe aortic stenosis 05/09/2017  . 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 GERD (gastroesophageal reflux disease) 10/19/2016  . 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 Carotid arterial disease ([place]) 05/28/2015  . Bilateral carotid bruits 01/16/2015  . Reflux esophagitis   . Hiatal hernia   . Status post bariatric surgery   . Chronic diarrhea   . Diverticulosis of colon without hemorrhage   . PAD (peripheral artery disease) ([place]) 04/13/2011  . Aortic stenosis, severe 09/10/2010  . CAD (coronary artery disease) 08/11/2010  . HTN (hypertension) 08/11/2010  . Obesities, morbid ([place]) 08/11/2010  . Tobacco abuse 08/11/2010  . Abdominal aortic aneurysm ([place]) 12/28/2007  . DIVERTICULOSIS, COLON 12/28/2007  . FATTY LIVER DISEASE 12/28/2007  . ANEMIA, HX OF 12/28/2007  . NAUSEA 12/12/2007  . ABDOMINAL PAIN, EPIGASTRIC 12/12/2007  . S/P redo CABG x 3 03/09/1999  . S/P CABG x 4 04/19/1983   PCP:  [name], MD Pharmacy:   [facility] [place], [place] - [address] AT [address] [place] [address] Phone: [phone] Fax: [place], [place] - [address] [place] [address] Phone: [phone] Fax: [phone]     Social Determinants of Health (SDOH) Interventions    Readmission Risk Interventions Readmission Risk Prevention Plan 11/26/2019  Transportation  Screening Complete  Medication Review ([place]) Complete  PCP or Specialist appointment within 3-5 days of discharge Complete  HRI or [place] Complete  SW Recovery Care/Counseling Consult Complete  Palliative Care Screening Complete  [place] Not Applicable  Some recent data might be hidden

## 2019-11-26 NOTE — Progress Notes (Signed)
PROGRESS NOTE  TANESHIA LORENCE  GGY:[id number] DOB: 10-03-1940 DOA: 11/25/2019 PCP: [name], MD   Brief Narrative: HPI: [name] is a 79 y.o. female with medical history significant for history of CAD, renal cell carcinoma that has metastasized to the lungs, severe aortic stenosis s/p TAVR, HTN, HLD whopresented to the hospital with complaint of weakness and failure to thrive.  Over the last 2 to 3 weeks patient has had worsening of her symptoms of generalized weakness and difficulty ambulating or doing any activities of daily living.  Today she was walking with her family behind her when she just became very weak and had to sit down on the floor.  She was unable to get up secondary to generalized weakness.  She has been having swelling of her legs more than her baseline.  She is also been having swelling of her abdomen and hands and arms.  She feels that her legs are weak with her left leg being weaker than the right leg.  She has not had any visual change, slurred speech or drooping face.  She denies any headache, nausea, vomiting, fever, chills, chest pain, palpitations.  He has been followed by oncology and has undergone chemotherapy but it was stopped in June of this year as she was unable to tolerate it.  She does not want any aggressive treatment for the cancer at this point.  At this time patient and her family are interested in establishing hospice care for end-of-life care.  She was admitted for diuresis and palliative care discussions. She remains volume overloaded.  Assessment & Plan: Principal Problem:   Chronic diastolic CHF (congestive heart failure) (HCC) Active Problems:   S/P TAVR (transcatheter aortic valve replacement)   Port-A-Cath in place   Generalized weakness   Failure to thrive in adult   Anasarca   Metastatic renal cell carcinoma to lung (HCC)   Thrombocytopenia (HCC)   Acute on chronic diastolic CHF (congestive heart failure) (HCC)  Failure to  thrive, weakness, anasarca, severe protein calorie malnutrition: BNP 688, albumin 2.1, Cr stable after initial lasix dose.  - Planning transition to hospice, though I suspect that another day of supervised aggressive diuresis would be helpful with anasarca which is causing quite a bit of discomfort to the patient. Having fair urine output. If stable metabolic panel in AM (can draw labs from port, so minimal discomfort), consider albumin + lasix.  - D/w CM and palliative care colleague at length. We will plan for home hospice services, we have 24 hours to get this arranged. Patient is very clear about her goals of care.   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 This is underpinning decline in functional status and quality of life.   CAD s/p CABG, AS s/p TAVR, HTN: Suspect anasarca more due to limited solute/hypoalbuminemia/malnutrition than decompensated heart failure. Most recent echo showed essentially normal function and valve placement/function.  - Continue nebivolol, hold ARB and norvasc in AM with borderline hypotension and need for diuresis.  - Continue aspirin.  Prolonged QT interval: Minimize provocative agents  Thrombocytopenia: Stable. Will not plan to recheck.  Hypothyroidism: TSH 3.882.  - Continue synthroid   Depression: No SI/HI - continue SSRI   DVT prophylaxis: SCDs Code Status: DNR Family Communication: None at bedside Disposition Plan:  Status is: Observation  The patient will require care spanning > 2 midnights and should be moved to inpatient because: IV treatments appropriate  due to intensity of illness or inability to take PO  Dispo: The patient is from: Home              Anticipated d/c is to: Home with hospice              Anticipated d/c date is: 1 day              Patient currently is not medically stable to d/c.  Consultants:   Palliative care  Procedures:    None  Antimicrobials:  None   Subjective: Feels weak, tired, nauseated. No major improvement since admission. Hands, feet, and abdomen feel swollen. She is too weak to roll from her right side to her back/left side in bed without assistance.  Objective: Vitals:   11/26/19 0545 11/26/19 0754 11/26/19 1008 11/26/19 1241  BP: 123/81  119/72 (!) 105/56  Pulse: 71  78 69  Resp: 20  20 16   Temp: 97.7 F (36.5 C)  (!) 97.4 F (36.3 C) 97.9 F (36.6 C)  TempSrc: Oral  Oral Oral  SpO2: 98% 98% 99% 98%  Weight:      Height:        Intake/Output Summary (Last 24 hours) at 11/26/2019 1601 Last data filed at 11/26/2019 1245 Gross per 24 hour  Intake 420 ml  Output 2400 ml  Net -1980 ml   Filed Weights   11/26/19 0128  Weight: 84.9 kg    Gen: Frail female in no distress Pulm: Non-labored breathing. Clear to auscultation bilaterally.  CV: Regular rate and rhythm. No murmur, rub, or gallop. UTD JVD, diffuse pitting edema/anasarca involving extremities and abdominal wall. GI: Abdomen soft, non-tender, non-distended, with normoactive bowel sounds. No organomegaly or masses felt. Ext: Warm, no deformities, decreased muscle bulk throughout.  Skin: No rashes, lesions or ulcers Neuro: Alert and oriented. No focal neurological deficits. Psych: Judgement and insight appear normal. Mood & affect appropriate.   Data Reviewed: I have personally reviewed following labs and imaging studies  CBC: Recent Labs  Lab 11/22/19 1030 11/25/19 1958 11/26/19 0438  WBC 8.6 9.1 7.1  NEUTROABS 5.6  --   --   HGB 12.5 11.6* 10.8*  HCT 37.4 34.4* 32.4*  MCV 101.6* 102.7* 105.2*  PLT 78* 69* 61*   Basic Metabolic Panel: Recent Labs  Lab 11/22/19 1030 11/25/19 1958 11/26/19 0438  NA 133* 135 135  K 3.8 3.6 3.5  CL 105 107 107  CO2 21* 21* 21*  GLUCOSE 102* 90 92  BUN 11 15 14   CREATININE 1.13* 1.13* 1.09*  CALCIUM 8.4* 8.6* 8.2*   GFR: Estimated Creatinine Clearance: 48.9 mL/min  (A) (by C-G formula based on SCr of 1.09 mg/dL (H)). Liver Function Tests: Recent Labs  Lab 11/22/19 1030 11/25/19 1958  AST 25 25  ALT 37 38  ALKPHOS 66 58  BILITOT 1.0 1.4*  PROT 5.3* 4.7*  ALBUMIN 2.4* 2.1*   No results for input(s): LIPASE, AMYLASE in the last 168 hours. No results for input(s): AMMONIA in the last 168 hours. Coagulation Profile: No results for input(s): INR, PROTIME in the last 168 hours. Cardiac Enzymes: No results for input(s): CKTOTAL, CKMB, CKMBINDEX, TROPONINI in the last 168 hours. BNP (last 3 results) No results for input(s): PROBNP in the last 8760 hours. HbA1C: No results for input(s): HGBA1C in the last 72 hours. CBG: No results for input(s): GLUCAP in the last 168 hours. Lipid Profile: No results for input(s): CHOL, HDL, LDLCALC, TRIG, CHOLHDL, LDLDIRECT in the  last 72 hours. Thyroid Function Tests: No results for input(s): TSH, T4TOTAL, FREET4, T3FREE, THYROIDAB in the last 72 hours. Anemia Panel: No results for input(s): VITAMINB12, FOLATE, FERRITIN, TIBC, IRON, RETICCTPCT in the last 72 hours. Urine analysis:    Component Value Date/Time   COLORURINE STRAW (A) 11/08/2019 1419   APPEARANCEUR CLEAR 11/08/2019 1419   LABSPEC 1.003 (L) 11/08/2019 1419   PHURINE 6.0 11/08/2019 1419   GLUCOSEU NEGATIVE 11/08/2019 1419   HGBUR NEGATIVE 11/08/2019 1419   BILIRUBINUR NEGATIVE 11/08/2019 1419   KETONESUR NEGATIVE 11/08/2019 1419   PROTEINUR NEGATIVE 11/08/2019 1419   UROBILINOGEN 0.2 10/24/2007 1052   NITRITE NEGATIVE 11/08/2019 1419   LEUKOCYTESUR MODERATE (A) 11/08/2019 1419   Recent Results (from the past 240 hour(s))  Respiratory Panel by RT PCR (Flu A&B, Covid) - Nasopharyngeal Swab     Status: None   Collection Time: 11/25/19 11:55 PM   Specimen: Nasopharyngeal Swab  Result Value Ref Range Status   SARS Coronavirus 2 by RT PCR NEGATIVE NEGATIVE Final    Comment: (NOTE) SARS-CoV-2 target nucleic acids are NOT DETECTED.  The  SARS-CoV-2 RNA is generally detectable in upper respiratoy specimens during the acute phase of infection. The lowest concentration of SARS-CoV-2 viral copies this assay can detect is 131 copies/mL. A negative result does not preclude SARS-Cov-2 infection and should not be used as the sole basis for treatment or other patient management decisions. A negative result may occur with  improper specimen collection/handling, submission of specimen other than nasopharyngeal swab, presence of viral mutation(s) within the areas targeted by this assay, and inadequate number of viral copies (<131 copies/mL). A negative result must be combined with clinical observations, patient history, and epidemiological information. The expected result is Negative.  Fact Sheet for Patients:  PinkCheek.be  Fact Sheet for Healthcare Providers:  GravelBags.it  This test is no t yet approved or cleared by the [place] FDA and  has been authorized for detection and/or diagnosis of SARS-CoV-2 by FDA under an Emergency Use Authorization (EUA). This EUA will remain  in effect (meaning this test can be used) for the duration of the COVID-19 declaration under Section 564(b)(1) of the Act, 21 U.S.C. section 360bbb-3(b)(1), unless the authorization is terminated or revoked sooner.     Influenza A by PCR NEGATIVE NEGATIVE Final   Influenza B by PCR NEGATIVE NEGATIVE Final    Comment: (NOTE) The Xpert Xpress SARS-CoV-2/FLU/RSV assay is intended as an aid in  the diagnosis of influenza from Nasopharyngeal swab specimens and  should not be used as a sole basis for treatment. Nasal washings and  aspirates are unacceptable for Xpert Xpress SARS-CoV-2/FLU/RSV  testing.  Fact Sheet for Patients: PinkCheek.be  Fact Sheet for Healthcare Providers: GravelBags.it  This test is not yet approved or cleared  by the [place] FDA and  has been authorized for detection and/or diagnosis of SARS-CoV-2 by  FDA under an Emergency Use Authorization (EUA). This EUA will remain  in effect (meaning this test can be used) for the duration of the  Covid-19 declaration under Section 564(b)(1) of the Act, 21  U.S.C. section 360bbb-3(b)(1), unless the authorization is  terminated or revoked. Performed at [facility], [place] [address], [place], [place] [address]       Radiology Studies: CT HEAD WO CONTRAST  Result Date: 11/25/2019 CLINICAL DATA:  Generalized weakness for 2 weeks. Decreased oral intake. EXAM: CT HEAD WITHOUT CONTRAST TECHNIQUE: Contiguous axial images were obtained from the base of the  skull through the vertex without intravenous contrast. COMPARISON:  06/02/2019 FINDINGS: Brain: No evidence of acute infarction, hemorrhage, hydrocephalus, extra-axial collection or mass lesion/mass effect. Mild prominence of the sulci. Vascular: No hyperdense vessel or unexpected calcification. Skull: Normal. Negative for fracture or focal lesion. Sinuses/Orbits: No acute finding. Other: None IMPRESSION: 1. No acute intracranial abnormalities. 2. Mild brain atrophy. Electronically Signed   By: Kerby Moors M.D.   On: 11/25/2019 19:36    Scheduled Meds:  amLODipine  10 mg Oral Daily   aspirin EC  81 mg Oral QHS   Chlorhexidine Gluconate Cloth  6 each Topical Daily   FLUoxetine  40 mg Oral QPM   fluticasone furoate-vilanterol  1 puff Inhalation Daily   furosemide  40 mg Intravenous Daily   irbesartan  300 mg Oral Daily   levothyroxine  100 mcg Oral QAC breakfast   nebivolol  20 mg Oral Daily   potassium chloride  10 mEq Oral Daily   sodium chloride flush  10-40 mL Intracatheter Q12H   sodium chloride flush  3 mL Intravenous Q12H   Continuous Infusions:  sodium chloride       LOS: 1 day   Time spent: 25 minutes.  Patrecia Pour, MD [facility] [email or web address] 11/26/2019, 4:01 PM

## 2019-11-26 NOTE — Plan of Care (Signed)

## 2019-11-26 NOTE — Consult Note (Signed)
Consultation Note Date: 11/26/2019   Patient Name: [name]  DOB: [birth year]  MRN: [id number]  Age / Sex: 79 y.o., female  PCP: [name], MD Referring Physician: Patrecia Pour, MD  Reason for Consultation: Establishing goals of care, Non pain symptom management, Pain control and Terminal Care  HPI/Patient Profile: 79 y.o. 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 Followed by Oncology, Dr. Alen Blew. Oncology treatment was discontinued in July 2021 due to poor tolerance. Patient/family do not wish for any aggressive treatment for cancer moving forward and wish to initiate hospice services. Hospital admission for failure to thrive, weakness, anasarca, severe protein calorie malnutrition, diastolic heart failure. Palliative care consultation for hospice consultation and symptom management.   Clinical Assessment and Goals of Care: PMT consult received and chart reviewed. Discussed with Dr. Bonner Puna. Patient and daughter are clear on goals aimed at comfort measures and discharge home with hospice services.   Visited with patient and daughter Carmell Austria) at bedside.  Reviewed plan of care and goals of care aimed at comfort measures, symptom management, quality of life, and dignity as she nears EOL.   Discussed hospice philosophy in detail. Plan is for home with hospice. Discussed DME.   Discussed symptom management medications. Patient really would prefer not to take medications but understands they will be on her med list as needed for symptom management. Also that hospice providers can make adjustments at home if symptom burden worsens.   Discussed EOL expectations.   Answered questions and concerns. Emotional support provided to daughter who is tearful but understands her mother's poor  prognosis. Recently lost her father in February. The patient is very calm and at peace with the plan. 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   PMT contact information given. Encouraged daughter to call PMT provider in AM if any questions regarding discharge plan. Hard Choices booklet given.  Discussed with hospice liaison, Melissa.    SUMMARY OF RECOMMENDATIONS    Goals are aimed at comfort and discharge home with hospice services.   [facility] hospice following. Will need DME.  Symptom management--see below  Comfort feeds per patient/family request  Code Status/Advance Care Planning:  DNR  Symptom Management:   Oxycodone 5mg  PO q4h prn pain/dyspnea/air hunger/tachypnea  Xanax 0.25mg  PO TID prn anxiety  Lomotil 1tab PO QID prn diarrhea  Zofran and Compazine prn nausea/vomiting  Continue lasix per attending   Palliative Prophylaxis:   Aspiration, Bowel Regimen, Delirium Protocol, Frequent Pain Assessment, Oral Care and Turn Reposition  Psycho-social/Spiritual:   Desire for further Chaplaincy support: yes  Additional Recommendations: Caregiving  Support/Resources, Compassionate Wean Education and Education on Hospice  Prognosis:   Poor long-term: months if not weeks with metastatic renal cell carcinoma and declining functional/nutritional status. Failure to thrive.   Discharge Planning: Home with Hospice      Primary Diagnoses: Present on Admission: . Chronic diastolic CHF (  congestive heart failure) ([place]) . Acute on chronic diastolic CHF (congestive heart failure) ([place])   I have reviewed the medical record, interviewed the patient and family, and examined the patient. The following aspects are pertinent.  Past Medical History:  Diagnosis Date  . Abdominal aortic aneurysm ([place])    infrarenal diagnosed with maximum measurement at 3.3 cm  . Anemia   . Cancer ([place])   . CHF (congestive heart failure) ([place])   . COPD  (chronic obstructive pulmonary disease) ([place])   . Coronary artery disease   . Depression   . Diverticulosis   . GERD (gastroesophageal reflux disease)   . Heart murmur   . History of tobacco abuse    Recurrent cigarette smoking  . Hyperlipidemia   . Hyperplastic colon polyp   . Hypertension   . Hypothyroidism   . Ischemic heart disease    had redo bypass surgery in 2001, at that time she had a free right internal mammary to the LAD, LAD endarterectomy, sequential vein graft to the OM1 and OM2 -- last catheterization was in 2008  . Leg fracture    remote right leg fracture  . Microscopic colitis   . Obesity    with gastric bypass surgery in 2004  . PAD (peripheral artery disease) ([facility])    right iliac occlusion  . PONV (postoperative nausea and vomiting)   . Right kidney mass   . S/P CABG x 4 1985   LIMA to LAD, Sequential SVG to OM1-OM2, SVG to RCA - Dr Redmond Pulling  . S/P redo CABG x 3 2001   Free RIMA to LAD with endarterectomy, sequential SVG to OM1-OM2 - Dr Servando Snare  . S/P TAVR (transcatheter aortic valve replacement) 05/09/2017   26 mm Edwards Sapien 3 transcatheter heart valve placed via percutaneous left transfemoral approach   Social History   Socioeconomic History  . Marital status: Widowed    Spouse name: Not on file  . Number of children: Not on file  . Years of education: Not on file  . Highest education level: Not on file  Occupational History  . Occupation: reitred    Fish farm manager: [employer]  Tobacco Use  . Smoking status: Current Some Day Smoker    Packs/day: 0.50    Types: Cigarettes    Start date: 01/10/1985    Last attempt to quit: 02/11/2016    Years since quitting: 3.7  . Smokeless tobacco: Never Used  . Tobacco comment: smokes 1-2 cigarettes a day  Vaping Use  . Vaping Use: Never used  Substance and Sexual Activity  . Alcohol use: Yes    Alcohol/week: 0.0 standard drinks    Comment: rarely wine  . Drug use: No  . Sexual activity: Never   Other Topics Concern  . Not on file  Social History Narrative  . Not on file   Social Determinants of Health   Financial Resource Strain:   . Difficulty of Paying Living Expenses: Not on file  Food Insecurity:   . Worried About Charity fundraiser in the Last Year: Not on file  . Ran Out of Food in the Last Year: Not on file  Transportation Needs:   . Lack of Transportation (Medical): Not on file  . Lack of Transportation (Non-Medical): Not on file  Physical Activity:   . Days of Exercise per Week: Not on file  . Minutes of Exercise per Session: Not on file  Stress:   . Feeling of Stress : Not on  file  Social Connections:   . Frequency of Communication with Friends and Family: Not on file  . Frequency of Social Gatherings with Friends and Family: Not on file  . Attends Religious Services: Not on file  . Active Member of Clubs or Organizations: Not on file  . Attends Archivist Meetings: Not on file  . Marital Status: Not on file   Family History  Problem Relation Age of Onset  . Stroke Father   . Diabetes Mother   . Pancreatic cancer Mother    Scheduled Meds: . aspirin EC  81 mg Oral QHS  . Chlorhexidine Gluconate Cloth  6 each Topical Daily  . FLUoxetine  40 mg Oral QPM  . fluticasone furoate-vilanterol  1 puff Inhalation Daily  . furosemide  40 mg Intravenous Daily  . levothyroxine  100 mcg Oral QAC breakfast  . nebivolol  20 mg Oral Daily  . potassium chloride  10 mEq Oral Daily  . sodium chloride flush  10-40 mL Intracatheter Q12H  . sodium chloride flush  3 mL Intravenous Q12H   Continuous Infusions: . sodium chloride     PRN Meds:.sodium chloride, acetaminophen **OR** acetaminophen, albuterol, ALPRAZolam, diphenoxylate-atropine, ondansetron, oxyCODONE, prochlorperazine, prochlorperazine, sodium chloride flush, sodium chloride flush Medications Prior to Admission:  Prior to Admission medications   Medication Sig Start Date End Date Taking?  Authorizing Provider  acetaminophen (TYLENOL) 500 MG tablet Take 1,000 mg by mouth See admin instructions. 1000mg  at bedtime And 1000mg  every 6 hours as needed for headache, pain, and fever   Yes [provider]  ALPRAZolam (XANAX) 0.25 MG tablet Take 0.25 mg by mouth 3 (three) times daily as needed for anxiety. 03/20/17  Yes [provider]  aspirin EC 81 MG tablet Take 81 mg by mouth at bedtime.    Yes [provider]  diclofenac Sodium (VOLTAREN) 1 % GEL Apply 4 g topically 3 (three) times daily as needed (Right knee pain).    Yes [provider]  diphenoxylate-atropine (LOMOTIL) 2.5-0.025 MG tablet Take 1 tablet by mouth 4 (four) times daily as needed for diarrhea or loose stools. 10/30/19  Yes Wyatt Portela, MD  FLUoxetine (PROZAC) 40 MG capsule Take 40 mg by mouth every evening.   Yes [provider]  irbesartan (AVAPRO) 300 MG tablet TAKE 1 TABLET(300 MG) BY MOUTH DAILY Patient taking differently: Take 300 mg by mouth daily.  07/12/19  Yes Martinique, Peter M, MD  levothyroxine (SYNTHROID) 125 MCG tablet Take 125 mcg by mouth daily. 11/19/19  Yes [provider]  Multiple Vitamin (MULTIVITAMIN WITH MINERALS) TABS tablet Take 1 tablet by mouth daily.   Yes [provider]  Nebivolol HCl (BYSTOLIC) 20 MG TABS Take 1 tablet (20 mg total) by mouth daily. 02/22/19  Yes Martinique, Peter M, MD  nitroGLYCERIN (NITROSTAT) 0.4 MG SL tablet Place 0.4 mg under the tongue every 5 (five) minutes as needed for chest pain (MAX 3 TABLETS).   Yes [provider]  nystatin (MYCOSTATIN/NYSTOP) powder Apply 1 application topically 3 (three) times daily as needed (sweating under breast).  12/12/18  Yes [provider]  OVER THE COUNTER MEDICATION Take 1 tablet by mouth daily. Joint supplement   Yes [provider]  pantoprazole (PROTONIX) 40 MG tablet TAKE 1 TABLET BY MOUTH EVERY DAY Patient taking differently: Take 40 mg by mouth  daily.  09/30/19  Yes Annitta Needs, NP  Phenazopyrid-Cranbry-C-Probiot (AZO URINARY TRACT SUPPORT PO) Take 1 tablet by mouth in  the morning and at bedtime.   Yes [provider]  PROAIR HFA 108 (90 Base) MCG/ACT inhaler Inhale 2 puffs into the lungs every 4 (four) hours as needed for shortness of breath or wheezing. 03/14/17  Yes [provider]  prochlorperazine (COMPAZINE) 10 MG tablet TAKE 1 TABLET(10 MG) BY MOUTH EVERY 6 HOURS AS NEEDED FOR NAUSEA OR VOMITING Patient taking differently: Take 10 mg by mouth in the morning and at bedtime.  10/16/19  Yes Wyatt Portela, MD  simvastatin (ZOCOR) 40 MG tablet Take 1 tablet (40 mg total) by mouth at bedtime. 03/18/19  Yes Martinique, Peter M, MD  spironolactone (ALDACTONE) 25 MG tablet Take 25 mg by mouth daily.  11/13/19  Yes [provider]  SYMBICORT 160-4.5 MCG/ACT inhaler Inhale 1 puff into the lungs 2 (two) times daily as needed (for respiratory issues.).  03/14/17  Yes [provider]  amLODipine (NORVASC) 10 MG tablet Take 1 tablet (10 mg total) by mouth daily. Patient not taking: Reported on 11/26/2019 03/22/19   Martinique, Peter M, MD  INLYTA 5 MG tablet TAKE 1 TABLET (5 MG TOTAL) BY MOUTH 2 (TWO) TIMES DAILY. Patient not taking: Reported on 11/26/2019 07/01/19   Wyatt Portela, MD  levothyroxine (SYNTHROID) 100 MCG tablet Take 1 tablet (100 mcg total) by mouth daily before breakfast. Patient not taking: Reported on 11/26/2019 09/06/19   Wyatt Portela, MD  magic mouthwash w/lidocaine SOLN Take 5 mLs by mouth 4 (four) times daily as needed for mouth pain. Swish and spit Patient not taking: Reported on 11/26/2019 08/15/19   Wyatt Portela, MD  potassium chloride (KLOR-CON) 10 MEQ tablet Take 1 tablet (10 mEq total) by mouth daily. Patient not taking: Reported on 11/26/2019 11/08/19   Corena Herter, PA-C   Allergies  Allergen Reactions  . Chantix [Varenicline Tartrate] Hives  . Dipyridamole Hives and Rash   Review  of Systems  Constitutional: Positive for activity change and appetite change.  Neurological: Positive for weakness.    Physical Exam Vitals and nursing note reviewed.  Constitutional:      General: She is awake.     Appearance: She is ill-appearing.  HENT:     Head: Normocephalic and atraumatic.  Pulmonary:     Effort: No tachypnea, accessory muscle usage or respiratory distress.  Skin:    General: Skin is warm and dry.     Coloration: Skin is pale.     Comments: anasarca  Neurological:     Mental Status: She is alert and oriented to person, place, and time.     Comments: Hard of hearing  Psychiatric:        Mood and Affect: Mood normal.        Speech: Speech normal.        Behavior: Behavior normal.        Cognition and Memory: Cognition normal.    Vital Signs: BP (!) 105/56 (BP Location: Right Arm)   Pulse 69   Temp 97.9 F (36.6 C) (Oral)   Resp 16   Ht 5' 7.5" (1.715 m)   Wt 84.9 kg   SpO2 98%   BMI 28.88 kg/m  Pain Scale: 0-10   Pain Score: 0-No pain   SpO2: SpO2: 98 % O2 Device:SpO2: 98 % O2 Flow Rate: .   IO: Intake/output summary:   Intake/Output Summary (Last 24 hours) at 11/26/2019 1701 Last data filed at 11/26/2019 1245 Gross per 24 hour  Intake 420 ml  Output  2400 ml  Net -1980 ml    LBM: Last BM Date: 11/24/19 Baseline Weight: Weight: 84.9 kg Most recent weight: Weight: 84.9 kg     Palliative Assessment/Data: PPS 40%   Flowsheet Rows     Most Recent Value  Intake Tab  Referral Department Hospitalist  Unit at Time of Referral Med/Surg Unit  Palliative Care Primary Diagnosis Cancer  Palliative Care Type New Palliative care  Reason for referral Clarify Goals of Care  Date first seen by Palliative Care 11/26/19  Clinical Assessment  Palliative Performance Scale Score 40%  Psychosocial & Spiritual Assessment  Palliative Care Outcomes  Patient/Family meeting held? Yes  Who was at the meeting? patient and daughter  Palliative Care  Outcomes Clarified goals of care, Counseled regarding hospice, Improved pain interventions, Improved non-pain symptom therapy, Provided end of life care assistance, Provided psychosocial or spiritual support, ACP counseling assistance, Transitioned to hospice      Time Total: 30min Greater than 50%  of this time was spent counseling and coordinating care related to the above assessment and plan.  Signed by: Basilio Cairo, NP   Please contact Palliative Medicine Team phone at [phone] for questions and concerns.  For individual provider: See Shea Evans

## 2019-11-26 NOTE — Progress Notes (Addendum)
Hydrologist [facility]) Hospital Liaison: RN note    Notified by Transition of Care Manger of patient/family request for [facility] services at home after discharge. Chart and patient information under review by [facility] physician. Hospice eligibility pending currently.    Writer spoke with Carmell Austria to initiate education related to hospice philosophy, services and team approach to care. Carmell Austria verbalized understanding of information given. Per discussion, plan is for discharge to home by  PTAR possibly tomorrow.     Please send signed and completed DNR form home with patient/family. Patient will need prescriptions for discharge comfort medications.     DME needs have been discussed, patient currently has the following equipment in the home:WC, BSC.  Patient/family requests the following DME for delivery to the home: Hospital bed, purewick and canister, and oxygen PRN, but possible bed. [place] equipment manager has been notified and will contact DME provider to arrange delivery to the home. Home address has been verified and is correct in the chart. Carmell Austria is the family member to contact to arrange time of delivery.  DME does not need to be at home prior to discharge.   [facility] aware of the above. Please notify [facility] when patient is ready to leave the unit at discharge. (Call [phone] or [phone] after 5pm.) [facility] information and contact numbers given to [facility].      Please call with any hospice related questions.     Thank you for this referral.     Clementeen Hoof, BSN, [facility] (listed on AM[facility] under Hospice and [place] [facility] [place])  [phone]

## 2019-11-26 NOTE — Care Management CC44 (Signed)
Condition Code 44 Documentation Completed  Patient Details  Name: VEATRICE ECKSTEIN MRN: [id number] Date of Birth: 12/14/1940   Condition Code 44 given:  Yes Patient signature on Condition Code 44 notice:  Yes Documentation of 2 MD's agreement:  Yes Code 44 added to claim:  Yes    Joaquin Courts, RN 11/26/2019, 11:55 AM

## 2019-11-26 NOTE — Care Management Obs Status (Signed)
MEDICARE OBSERVATION STATUS NOTIFICATION   Patient Details  Name: [name] MRN: [id number] Date of Birth: 29-Apr-1940   Medicare Observation Status Notification Given:  Yes    Joaquin Courts, RN 11/26/2019, 11:55 AM

## 2019-11-27 DIAGNOSIS — F32A Depression, unspecified: Secondary | ICD-10-CM | POA: Diagnosis present

## 2019-11-27 DIAGNOSIS — I714 Abdominal aortic aneurysm, without rupture: Secondary | ICD-10-CM | POA: Diagnosis present

## 2019-11-27 DIAGNOSIS — K219 Gastro-esophageal reflux disease without esophagitis: Secondary | ICD-10-CM | POA: Diagnosis present

## 2019-11-27 DIAGNOSIS — Z953 Presence of xenogenic heart valve: Secondary | ICD-10-CM | POA: Diagnosis not present

## 2019-11-27 DIAGNOSIS — F411 Generalized anxiety disorder: Secondary | ICD-10-CM | POA: Diagnosis present

## 2019-11-27 DIAGNOSIS — J449 Chronic obstructive pulmonary disease, unspecified: Secondary | ICD-10-CM | POA: Diagnosis present

## 2019-11-27 DIAGNOSIS — D696 Thrombocytopenia, unspecified: Secondary | ICD-10-CM | POA: Diagnosis present

## 2019-11-27 DIAGNOSIS — Z951 Presence of aortocoronary bypass graft: Secondary | ICD-10-CM | POA: Diagnosis not present

## 2019-11-27 DIAGNOSIS — I5032 Chronic diastolic (congestive) heart failure: Secondary | ICD-10-CM | POA: Diagnosis not present

## 2019-11-27 DIAGNOSIS — R601 Generalized edema: Secondary | ICD-10-CM | POA: Diagnosis present

## 2019-11-27 DIAGNOSIS — I110 Hypertensive heart disease with heart failure: Secondary | ICD-10-CM | POA: Diagnosis present

## 2019-11-27 DIAGNOSIS — Z20822 Contact with and (suspected) exposure to covid-19: Secondary | ICD-10-CM | POA: Diagnosis present

## 2019-11-27 DIAGNOSIS — C649 Malignant neoplasm of unspecified kidney, except renal pelvis: Secondary | ICD-10-CM | POA: Diagnosis not present

## 2019-11-27 DIAGNOSIS — I739 Peripheral vascular disease, unspecified: Secondary | ICD-10-CM | POA: Diagnosis present

## 2019-11-27 DIAGNOSIS — E039 Hypothyroidism, unspecified: Secondary | ICD-10-CM | POA: Diagnosis present

## 2019-11-27 DIAGNOSIS — I5033 Acute on chronic diastolic (congestive) heart failure: Secondary | ICD-10-CM | POA: Diagnosis present

## 2019-11-27 DIAGNOSIS — I350 Nonrheumatic aortic (valve) stenosis: Secondary | ICD-10-CM | POA: Diagnosis present

## 2019-11-27 DIAGNOSIS — E43 Unspecified severe protein-calorie malnutrition: Secondary | ICD-10-CM | POA: Diagnosis present

## 2019-11-27 DIAGNOSIS — F17210 Nicotine dependence, cigarettes, uncomplicated: Secondary | ICD-10-CM | POA: Diagnosis present

## 2019-11-27 DIAGNOSIS — I2510 Atherosclerotic heart disease of native coronary artery without angina pectoris: Secondary | ICD-10-CM | POA: Diagnosis present

## 2019-11-27 DIAGNOSIS — E785 Hyperlipidemia, unspecified: Secondary | ICD-10-CM | POA: Diagnosis present

## 2019-11-27 DIAGNOSIS — C7800 Secondary malignant neoplasm of unspecified lung: Secondary | ICD-10-CM | POA: Diagnosis present

## 2019-11-27 DIAGNOSIS — R197 Diarrhea, unspecified: Secondary | ICD-10-CM | POA: Diagnosis not present

## 2019-11-27 DIAGNOSIS — R627 Adult failure to thrive: Secondary | ICD-10-CM | POA: Diagnosis present

## 2019-11-27 DIAGNOSIS — Z66 Do not resuscitate: Secondary | ICD-10-CM | POA: Diagnosis present

## 2019-11-27 DIAGNOSIS — H9190 Unspecified hearing loss, unspecified ear: Secondary | ICD-10-CM | POA: Diagnosis present

## 2019-11-27 DIAGNOSIS — Z515 Encounter for palliative care: Secondary | ICD-10-CM | POA: Diagnosis not present

## 2019-11-27 LAB — BASIC METABOLIC PANEL
Anion gap: 6 (ref 5–15)
BUN: 17 mg/dL (ref 8–23)
CO2: 24 mmol/L (ref 22–32)
Calcium: 8.3 mg/dL — ABNORMAL LOW (ref 8.9–10.3)
Chloride: 106 mmol/L (ref 98–111)
Creatinine, Ser: 1.19 mg/dL — ABNORMAL HIGH (ref 0.44–1.00)
GFR, Estimated: 47 mL/min — ABNORMAL LOW (ref >60)
Glucose, Bld: 99 mg/dL (ref 70–99)
Potassium: 3.3 mmol/L — ABNORMAL LOW (ref 3.5–5.1)
Sodium: 136 mmol/L (ref 135–145)

## 2019-11-27 LAB — BRAIN NATRIURETIC PEPTIDE: B Natriuretic Peptide: 370.5 pg/mL — ABNORMAL HIGH (ref 0.0–100.0)

## 2019-11-27 MED ORDER — ALBUMIN HUMAN 25 % IV SOLN
25.0000 g | Freq: Four times a day (QID) | INTRAVENOUS | Status: AC
  Administered 2019-11-27 – 2019-11-28 (×4): 25 g via INTRAVENOUS
  Filled 2019-11-27 (×4): qty 100

## 2019-11-27 MED ORDER — DICLOFENAC SODIUM 1 % EX GEL
2.0000 g | Freq: Four times a day (QID) | CUTANEOUS | Status: DC
  Administered 2019-11-27 – 2019-11-28 (×4): 2 g via TOPICAL
  Filled 2019-11-27: qty 100

## 2019-11-27 MED ORDER — POTASSIUM CHLORIDE 20 MEQ PO PACK
40.0000 meq | PACK | Freq: Two times a day (BID) | ORAL | Status: AC
  Administered 2019-11-27 (×2): 40 meq via ORAL
  Filled 2019-11-27 (×2): qty 2

## 2019-11-27 MED ORDER — NEBIVOLOL HCL 5 MG PO TABS
5.0000 mg | ORAL_TABLET | Freq: Every day | ORAL | Status: DC
  Administered 2019-11-27 – 2019-11-28 (×2): 5 mg via ORAL
  Filled 2019-11-27 (×2): qty 1

## 2019-11-27 MED ORDER — FUROSEMIDE 10 MG/ML IJ SOLN
40.0000 mg | Freq: Two times a day (BID) | INTRAMUSCULAR | Status: AC
  Administered 2019-11-27 (×2): 40 mg via INTRAVENOUS
  Filled 2019-11-27 (×2): qty 4

## 2019-11-27 MED ORDER — ONDANSETRON 4 MG PO TBDP: 8.0000 mg | ORAL_TABLET | Freq: Three times a day (TID) | ORAL | Status: DC | PRN

## 2019-11-27 MED ORDER — PROCHLORPERAZINE EDISYLATE 10 MG/2ML IJ SOLN: 10.0000 mg | Freq: Four times a day (QID) | INTRAMUSCULAR | Status: DC | PRN

## 2019-11-27 MED ORDER — FUROSEMIDE 10 MG/ML IJ SOLN: 40.0000 mg | Freq: Two times a day (BID) | INTRAMUSCULAR | Status: DC

## 2019-11-27 NOTE — Progress Notes (Signed)
Daily Progress Note   Patient Name: [name]       Date: 11/27/2019 DOB: 1940-11-09  Age: 79 y.o. MRN#: [id number] Attending Physician: Charlynne Cousins, MD Primary Care Physician: [name], MD Admit Date: 11/25/2019  Reason for Consultation/Follow-up: Establishing goals of care and Hospice Evaluation  Subjective: Patient sleeping comfortably during visit. No family at bedside.   Spoke with daughter, [name] via telephone. Updated on plan of care including one more hospital day for diuresis. Plan will be home with hospice services. [facility] following. Answered questions. [name] has PMT contact information and understands she can call team phone if questions.   Length of Stay: 2  Current Medications: Scheduled Meds:  . aspirin EC  81 mg Oral QHS  . Chlorhexidine Gluconate Cloth  6 each Topical Daily  . diclofenac Sodium  2 g Topical QID  . FLUoxetine  40 mg Oral QPM  . fluticasone furoate-vilanterol  1 puff Inhalation Daily  . furosemide  40 mg Intravenous Q12H  . levothyroxine  100 mcg Oral QAC breakfast  . nebivolol  5 mg Oral Daily  . potassium chloride  40 mEq Oral BID    Continuous Infusions: . albumin human 25 g (11/27/19 1019)    PRN Meds: acetaminophen **OR** acetaminophen, albuterol, ALPRAZolam, diphenoxylate-atropine, ondansetron, oxyCODONE, prochlorperazine, prochlorperazine, sodium chloride flush  Physical Exam Vitals and nursing note reviewed.  Constitutional:      General: She is sleeping.  Cardiovascular:     Rate and Rhythm: Normal rate.  Pulmonary:     Effort: No tachypnea, accessory muscle usage or respiratory distress.             Vital Signs: BP 124/75 (BP Location: Right Arm)   Pulse 71   Temp 98 F (36.7 C) (Oral)   Resp  16   Ht 5' 7.5" (1.715 m)   Wt 84.9 kg   SpO2 98%   BMI 28.88 kg/m  SpO2: SpO2: 98 % O2 Device: O2 Device: Room Air O2 Flow Rate:    Intake/output summary:   Intake/Output Summary (Last 24 hours) at 11/27/2019 1400 Last data filed at 11/27/2019 1338 Gross per 24 hour  Intake 180 ml  Output 1825 ml  Net -1645 ml   LBM: Last BM Date: 11/26/19 Baseline Weight: Weight: 84.9 kg Most recent weight: Weight:  84.9 kg       Palliative Assessment/Data: PPS 40%    Flowsheet Rows     Most Recent Value  Intake Tab  Referral Department Hospitalist  Unit at Time of Referral Med/Surg Unit  Palliative Care Primary Diagnosis Cancer  Palliative Care Type New Palliative care  Reason for referral Clarify Goals of Care  Date first seen by Palliative Care 11/26/19  Clinical Assessment  Palliative Performance Scale Score 40%  Psychosocial & Spiritual Assessment  Palliative Care Outcomes  Patient/Family meeting held? Yes  Who was at the meeting? patient and daughter  Palliative Care Outcomes Clarified goals of care, Counseled regarding hospice, Improved pain interventions, Improved non-pain symptom therapy, Provided end of life care assistance, Provided psychosocial or spiritual support, ACP counseling assistance, Transitioned to hospice      Patient Active Problem List   Diagnosis Date Noted  . Comfort measures only status   . Diarrhea   . Generalized weakness 11/25/2019  . Failure to thrive in adult 11/25/2019  . Anasarca 11/25/2019  . 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 Thrombocytopenia ([place]) 11/25/2019  . Acute on chronic diastolic CHF (congestive heart failure) ([place]) 11/25/2019  . Port-A-Cath in place 07/26/2019  . Acute respiratory failure with hypoxia ([place]) 06/03/2019  . Acute encephalopathy 06/03/2019  . UTI (urinary tract infection) 06/02/2019  . Hypoxia 06/02/2019  . Terminal care 04/11/2019  . Severe sepsis ([place])   . Macrocytic anemia   .  Abdominal wall abscess at site of surgical wound 01/11/2018  . Hypothyroidism 01/11/2018  . Anxiety state 01/11/2018  . COPD (chronic obstructive pulmonary disease) ([place]) 01/11/2018  . AKI (acute kidney injury) ([place]) 01/11/2018  . Chronic diastolic CHF (congestive heart failure) ([place]) 01/11/2018  . Renal cell carcinoma ([place]) 01/11/2018  . Neoplasm of right kidney 12/28/2017  . Severe aortic stenosis 05/09/2017  . 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 GERD (gastroesophageal reflux disease) 10/19/2016  . 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 Carotid arterial disease ([place]) 05/28/2015  . Bilateral carotid bruits 01/16/2015  . Reflux esophagitis   . Hiatal hernia   . Status post bariatric surgery   . Chronic diarrhea   . Diverticulosis of colon without hemorrhage   . PAD (peripheral artery disease) ([place]) 04/13/2011  . Aortic stenosis, severe 09/10/2010  . CAD (coronary artery disease) 08/11/2010  . HTN (hypertension) 08/11/2010  . Obesities, morbid ([place]) 08/11/2010  . Tobacco abuse 08/11/2010  . Abdominal aortic aneurysm ([place]) 12/28/2007  . DIVERTICULOSIS, COLON 12/28/2007  . FATTY LIVER DISEASE 12/28/2007  . ANEMIA, HX OF 12/28/2007  . NAUSEA 12/12/2007  . ABDOMINAL PAIN, EPIGASTRIC 12/12/2007  . S/P redo CABG x 3 03/09/1999  . S/P CABG x 4 04/19/1983    Palliative Care Assessment & Plan   Patient Profile: 78 y.o. 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 Followed by Oncology, Dr. Alen Blew. Oncology treatment was discontinued in July 2021 due to poor tolerance. Patient/family do not wish for any aggressive treatment for cancer moving forward and wish to initiate hospice services. Hospital admission for failure to thrive, weakness, anasarca, severe protein calorie malnutrition, diastolic heart failure. Palliative care consultation for hospice  consultation and symptom management.   Assessment: Metastatic renal cell carcinoma Anasarca Failure to thrive Severe protein calorie malnutrition CAD s/p CABG Chronic thrombocytopenia  Recommendations/Plan: Continue medical management inpatient.  Discharge goals are aimed at  comfort and home with hospice services.  [facility] hospice following. Will need DME delivered prior to discharge home.  Symptom management meds on MAR. Comfort feeds per patient/family request.  Code Status: DNR   Code Status Orders  (From admission, onward)         Start     Ordered   11/25/19 2314  Do not attempt resuscitation (DNR)  Continuous       Question Answer Comment  In the event of cardiac or respiratory ARREST Do not call a "code blue"   In the event of cardiac or respiratory ARREST Do not perform Intubation, CPR, defibrillation or ACLS   In the event of cardiac or respiratory ARREST Use medication by any route, position, wound care, and other measures to relive pain and suffering. May use oxygen, suction and manual treatment of airway obstruction as needed for comfort.      11/25/19 2313        Code Status History    Date Active Date Inactive Code Status Order ID Comments User Context   06/03/2019 0158 06/05/2019 1754 Full Code [id number]  Elwyn Reach, MD ED   01/11/2018 0453 01/14/2018 1632 Full Code [id number]  Vianne Bulls, MD ED   12/28/2017 1711 12/31/2017 1549 Full Code [id number]  Raynelle Bring, MD Inpatient   05/09/2017 1246 05/11/2017 1902 Full Code [id number]  Sherren Mocha, MD Inpatient   04/04/2017 1041 04/04/2017 1635 Full Code [id number]  [place], Peter M, MD Inpatient   Advance Care Planning Activity    Advance Directive Documentation     Most Recent Value  Type of Advance Directive Healthcare Power of Attorney  Pre-existing out of facility DNR order (yellow form or pink MOST form) --  "MOST" Form in Place? --      Prognosis:  Poor long-term: months if not  weeks with metastatic renal cell carcinoma and declining functional/nutritional status. Failure to thrive.   Discharge Planning: Home with Hospice  Care plan was discussed with RN, daughter [name]). Patient sleeping during visit.   Thank you for allowing the Palliative Medicine Team to assist in the care of this patient.   Total Time 20 Prolonged Time Billed  no      Greater than 50%  of this time was spent counseling and coordinating care related to the above assessment and plan.  Ihor Dow, DNP, FNP-C Palliative Medicine Team  Phone: [phone] Fax: [phone]  Please contact Palliative Medicine Team phone at [phone] for questions and concerns.

## 2019-11-27 NOTE — Progress Notes (Signed)
[facility] PROGRESS NOTE    Progress Note  [name]  WYO:[id number] DOB: 05/28/1940 DOA: 11/25/2019 PCP: [name], MD     Brief Narrative:   [name] is an 79 y.o. female past medical history significant for CAD, renal cell carcinoma which has metastasized to the lung, with her last chemotherapy onJune of this year as she was unable to tolerate it severe aortic stenosis status post TAVR who presents to the hospital complaining of weakness and failure to thrive over the last 2 weeks prior to admission.  On admission the patient and family were interested in hospice and end-of-life care.  She was admitted for diuresis and palliative care discussion as she was fluid overloaded.  Assessment/Plan:   Anasarca/failure to thrive/severe protein caloric malnutrition: She has been diuresed with IV Lasix and IV albumin.  It was discussed with palliative care that she will need 24 hours of IV diuresis and will plan with home with hospice service.  Metastatic renal cell carcinoma status post laparoscopic nephrectomy with clear margins now with metastatic lung disease: Not tolerating chemotherapy noted.  CAD status post CABG aortic stenosis status post CABG: Noted holding antihypertensive medication due to borderline low blood pressure. Will discuss with family the need to discontinue aspirin.  Prolonged QTC: Noted.  Chronic thrombocytopenia: We will plan not to check.  Hypothyroidism: Continue Synthroid.  Depression: Continue SSRI.   DVT prophylaxis: lovenox Family Communication:daughter Status is: Observation  The patient will require care spanning > 2 midnights and should be moved to inpatient because: Hemodynamically unstable  Dispo: The patient is from: Home              Anticipated d/c is to: Home              Anticipated d/c date is: 1 day              Patient currently is not medically stable to d/c.        Code Status:     Code Status  Orders  (From admission, onward)         Start     Ordered   11/25/19 2314  Do not attempt resuscitation (DNR)  Continuous       Question Answer Comment  In the event of cardiac or respiratory ARREST Do not call a "code blue"   In the event of cardiac or respiratory ARREST Do not perform Intubation, CPR, defibrillation or ACLS   In the event of cardiac or respiratory ARREST Use medication by any route, position, wound care, and other measures to relive pain and suffering. May use oxygen, suction and manual treatment of airway obstruction as needed for comfort.      11/25/19 2313        Code Status History    Date Active Date Inactive Code Status Order ID Comments User Context   06/03/2019 0158 06/05/2019 1754 Full Code [id number]  Elwyn Reach, MD ED   01/11/2018 0453 01/14/2018 1632 Full Code [id number]  Vianne Bulls, MD ED   12/28/2017 1711 12/31/2017 1549 Full Code [id number]  Raynelle Bring, MD Inpatient   05/09/2017 1246 05/11/2017 1902 Full Code [id number]  Sherren Mocha, MD Inpatient   04/04/2017 1041 04/04/2017 1635 Full Code [id number]  [place], Peter M, MD Inpatient   Advance Care Planning Activity    Advance Directive Documentation     Most Recent Value  Type of Advance Directive Healthcare Power of Attorney  Pre-existing out of facility  DNR order (yellow form or pink MOST form) --  "MOST" Form in Place? --        IV Access:    Peripheral IV   Procedures and diagnostic studies:   CT HEAD WO CONTRAST  Result Date: 11/25/2019 CLINICAL DATA:  Generalized weakness for 2 weeks. Decreased oral intake. EXAM: CT HEAD WITHOUT CONTRAST TECHNIQUE: Contiguous axial images were obtained from the base of the skull through the vertex without intravenous contrast. COMPARISON:  06/02/2019 FINDINGS: Brain: No evidence of acute infarction, hemorrhage, hydrocephalus, extra-axial collection or mass lesion/mass effect. Mild prominence of the sulci. Vascular: No hyperdense vessel or  unexpected calcification. Skull: Normal. Negative for fracture or focal lesion. Sinuses/Orbits: No acute finding. Other: None IMPRESSION: 1. No acute intracranial abnormalities. 2. Mild brain atrophy. Electronically Signed   By: Kerby Moors M.D.   On: 11/25/2019 19:36     Medical Consultants:    None.  Anti-Infectives:   none  Subjective:    [name] relates her left lower extremity is painful her narcotics have been working to control the pain.  Objective:    Vitals:   11/26/19 1008 11/26/19 1241 11/26/19 2055 11/27/19 0541  BP: 119/72 (!) 105/56 (!) 100/56 114/71  Pulse: 78 69 70 68  Resp: 20 16  20   Temp: (!) 97.4 F (36.3 C) 97.9 F (36.6 C) 97.6 F (36.4 C) 97.8 F (36.6 C)  TempSrc: Oral Oral Oral Oral  SpO2: 99% 98% 97% 97%  Weight:      Height:       SpO2: 97 %   Intake/Output Summary (Last 24 hours) at 11/27/2019 0755 Last data filed at 11/27/2019 0304 Gross per 24 hour  Intake 540 ml  Output 3825 ml  Net -3285 ml   Filed Weights   11/26/19 0128  Weight: 84.9 kg    Exam: General exam: In no acute distress. Respiratory system: Good air movement and clear to auscultation. Cardiovascular system: S1 & S2 heard, RRR. No JVD. Gastrointestinal system: Abdomen is nondistended, soft and nontender.  Extremities: 3+ edema Skin: No rashes, lesions or ulcers Psychiatry: Judgement and insight appear normal. Mood & affect appropriate.    Data Reviewed:    Labs: Basic Metabolic Panel: Recent Labs  Lab 11/22/19 1030 11/22/19 1030 11/25/19 1958 11/25/19 1958 11/26/19 0438 11/27/19 0417  NA 133*  --  135  --  135 136  K 3.8   < > 3.6   < > 3.5 3.3*  CL 105  --  107  --  107 106  CO2 21*  --  21*  --  21* 24  GLUCOSE 102*  --  90  --  92 99  BUN 11  --  15  --  14 17  CREATININE 1.13*  --  1.13*  --  1.09* 1.19*  CALCIUM 8.4*  --  8.6*  --  8.2* 8.3*   < > = values in this interval not displayed.   GFR Estimated Creatinine Clearance:  44.8 mL/min (A) (by C-G formula based on SCr of 1.19 mg/dL (H)). Liver Function Tests: Recent Labs  Lab 11/22/19 1030 11/25/19 1958  AST 25 25  ALT 37 38  ALKPHOS 66 58  BILITOT 1.0 1.4*  PROT 5.3* 4.7*  ALBUMIN 2.4* 2.1*   No results for input(s): LIPASE, AMYLASE in the last 168 hours. No results for input(s): AMMONIA in the last 168 hours. Coagulation profile No results for input(s): INR, PROTIME in the last 168 hours. COVID-19  Labs  No results for input(s): DDIMER, FERRITIN, LDH, CRP in the last 72 hours.  Lab Results  Component Value Date   SARSCOV2NAA NEGATIVE 11/25/2019   [place] NEGATIVE 06/02/2019    CBC: Recent Labs  Lab 11/22/19 1030 11/25/19 1958 11/26/19 0438  WBC 8.6 9.1 7.1  NEUTROABS 5.6  --   --   HGB 12.5 11.6* 10.8*  HCT 37.4 34.4* 32.4*  MCV 101.6* 102.7* 105.2*  PLT 78* 69* 61*   Cardiac Enzymes: No results for input(s): CKTOTAL, CKMB, CKMBINDEX, TROPONINI in the last 168 hours. BNP (last 3 results) No results for input(s): PROBNP in the last 8760 hours. CBG: No results for input(s): GLUCAP in the last 168 hours. D-Dimer: No results for input(s): DDIMER in the last 72 hours. Hgb A1c: No results for input(s): HGBA1C in the last 72 hours. Lipid Profile: No results for input(s): CHOL, HDL, LDLCALC, TRIG, CHOLHDL, LDLDIRECT in the last 72 hours. Thyroid function studies: No results for input(s): TSH, T4TOTAL, T3FREE, THYROIDAB in the last 72 hours.  Invalid input(s): FREET3 Anemia work up: No results for input(s): VITAMINB12, FOLATE, FERRITIN, TIBC, IRON, RETICCTPCT in the last 72 hours. Sepsis Labs: Recent Labs  Lab 11/22/19 1030 11/25/19 1958 11/26/19 0438  WBC 8.6 9.1 7.1   Microbiology Recent Results (from the past 240 hour(s))  Respiratory Panel by RT PCR (Flu A&B, Covid) - Nasopharyngeal Swab     Status: None   Collection Time: 11/25/19 11:55 PM   Specimen: Nasopharyngeal Swab  Result Value Ref Range Status   SARS  Coronavirus 2 by RT PCR NEGATIVE NEGATIVE Final    Comment: (NOTE) SARS-CoV-2 target nucleic acids are NOT DETECTED.  The SARS-CoV-2 RNA is generally detectable in upper respiratoy specimens during the acute phase of infection. The lowest concentration of SARS-CoV-2 viral copies this assay can detect is 131 copies/mL. A negative result does not preclude SARS-Cov-2 infection and should not be used as the sole basis for treatment or other patient management decisions. A negative result may occur with  improper specimen collection/handling, submission of specimen other than nasopharyngeal swab, presence of viral mutation(s) within the areas targeted by this assay, and inadequate number of viral copies (<131 copies/mL). A negative result must be combined with clinical observations, patient history, and epidemiological information. The expected result is Negative.  Fact Sheet for Patients:  PinkCheek.be  Fact Sheet for Healthcare Providers:  GravelBags.it  This test is no t yet approved or cleared by the [place] FDA and  has been authorized for detection and/or diagnosis of SARS-CoV-2 by FDA under an Emergency Use Authorization (EUA). This EUA will remain  in effect (meaning this test can be used) for the duration of the COVID-19 declaration under Section 564(b)(1) of the Act, 21 U.S.C. section 360bbb-3(b)(1), unless the authorization is terminated or revoked sooner.     Influenza A by PCR NEGATIVE NEGATIVE Final   Influenza B by PCR NEGATIVE NEGATIVE Final    Comment: (NOTE) The Xpert Xpress SARS-CoV-2/FLU/RSV assay is intended as an aid in  the diagnosis of influenza from Nasopharyngeal swab specimens and  should not be used as a sole basis for treatment. Nasal washings and  aspirates are unacceptable for Xpert Xpress SARS-CoV-2/FLU/RSV  testing.  Fact Sheet for  Patients: PinkCheek.be  Fact Sheet for Healthcare Providers: GravelBags.it  This test is not yet approved or cleared by the [place] FDA and  has been authorized for detection and/or diagnosis of SARS-CoV-2 by  FDA under an Emergency Use  Authorization (EUA). This EUA will remain  in effect (meaning this test can be used) for the duration of the  Covid-19 declaration under Section 564(b)(1) of the Act, 21  U.S.C. section 360bbb-3(b)(1), unless the authorization is  terminated or revoked. Performed at [facility], [place] [address] [place], [place] [address]      Medications:   . aspirin EC  81 mg Oral QHS  . Chlorhexidine Gluconate Cloth  6 each Topical Daily  . FLUoxetine  40 mg Oral QPM  . fluticasone furoate-vilanterol  1 puff Inhalation Daily  . furosemide  40 mg Intravenous Daily  . levothyroxine  100 mcg Oral QAC breakfast  . nebivolol  20 mg Oral Daily  . potassium chloride  10 mEq Oral Daily  . sodium chloride flush  10-40 mL Intracatheter Q12H  . sodium chloride flush  3 mL Intravenous Q12H   Continuous Infusions: . sodium chloride        LOS: 1 day   Charlynne Cousins  [facility]  11/27/2019, 7:55 AM

## 2019-11-27 NOTE — Progress Notes (Signed)
[facility])  Chart and pt information have been reviewed by [facility] physician.  Hospice eligibility confirmed.  Hospital liaison spoke with Carmell Austria, patient's daughter, to answer any questions at this time. Carmell Austria verbalized understanding of information given.  Per discussion the plan is to discharge home tomorrow.  [place] send signed and completed DNR home with pt/family.  Please provide prescriptions at discharge as needed to ensure ongoing symptom management until pt can be admitted onto hospice services.    The following DME has been ordered: hospital bed, alternating pressure mattress pas, over bed table, supplemental O2.  Address has been verified and is correct in the chart.  ACC information and contact numbers given to [facility]. Please call with any questions or concerns.  Thank you for the opportunity to participate in this pt's care.  Domenic Moras, BSN, RN [facility] [phone] (24h on call)

## 2019-11-27 NOTE — Plan of Care (Signed)

## 2019-11-28 ENCOUNTER — Inpatient Hospital Stay: Payer: Medicare Other | Admitting: Oncology

## 2019-11-28 DIAGNOSIS — E778 Other disorders of glycoprotein metabolism: Secondary | ICD-10-CM

## 2019-11-28 DIAGNOSIS — R601 Generalized edema: Secondary | ICD-10-CM | POA: Diagnosis not present

## 2019-11-28 DIAGNOSIS — I5032 Chronic diastolic (congestive) heart failure: Secondary | ICD-10-CM | POA: Diagnosis not present

## 2019-11-28 DIAGNOSIS — I5033 Acute on chronic diastolic (congestive) heart failure: Secondary | ICD-10-CM | POA: Diagnosis not present

## 2019-11-28 DIAGNOSIS — C649 Malignant neoplasm of unspecified kidney, except renal pelvis: Secondary | ICD-10-CM | POA: Diagnosis not present

## 2019-11-28 MED ORDER — ALBUMIN HUMAN 25 % IV SOLN
25.0000 g | Freq: Four times a day (QID) | INTRAVENOUS | Status: DC
  Administered 2019-11-28: 25 g via INTRAVENOUS
  Filled 2019-11-28 (×2): qty 100

## 2019-11-28 MED ORDER — FUROSEMIDE 10 MG/ML IJ SOLN: 60.0000 mg | Freq: Two times a day (BID) | INTRAMUSCULAR | Status: DC

## 2019-11-28 MED ORDER — HEPARIN SOD (PORK) LOCK FLUSH 100 UNIT/ML IV SOLN
500.0000 [IU] | INTRAVENOUS | Status: AC | PRN
  Administered 2019-11-28: 500 [IU]
  Filled 2019-11-28: qty 5

## 2019-11-28 MED ORDER — FUROSEMIDE 10 MG/ML IJ SOLN
80.0000 mg | Freq: Two times a day (BID) | INTRAMUSCULAR | Status: DC
  Administered 2019-11-28: 80 mg via INTRAVENOUS
  Filled 2019-11-28: qty 8

## 2019-11-28 NOTE — Progress Notes (Signed)
[facility])  Referral center made aware of patient's pending discharge.  Family in agreement with plan to admit to hospice services.  Pt deemed eligible for hospice per [facility] MD.  [place] Parents send signed and completed DNR home with pt/family.  Please provide prescriptions at discharge as needed to ensure ongoing symptom management until patient can be admitted onto hospice services..    Thank you for the opportunity to participate in this pt's care.  Domenic Moras, BSN, RN [facility] [phone] (24h on call)

## 2019-11-28 NOTE — Care Management Important Message (Signed)
Important Message  Patient Details IM Letter given to the Patient. Name: [name] MRN: [id number] Date of Birth: June 12, 1940   Medicare Important Message Given:  Yes     Kerin Salen 11/28/2019, 10:55 AM

## 2019-11-28 NOTE — TOC Progression Note (Signed)
Transition of Care [facility]) - Progression Note    Patient Details  Name: [name] MRN: [id number] Date of Birth: [birth year]  [facility] CM/SW Contact  Purcell Mouton, RN Phone Number: 11/28/2019, 12:28 PM  Clinical Narrative:     Spoke with pt's daughter Carmell Austria, concerning pt's discharge to home.  She will be at the home when pt arrive. Carmell Austria asked that RN call her when pt is leaving [facility].   Expected Discharge Plan: Home w Hospice Care Barriers to Discharge: Continued Medical Work up  Expected Discharge Plan and Services Expected Discharge Plan: [place]   Discharge Planning Services: CM Consult Post Acute Care Choice: Hospice Living arrangements for the past 2 months: Single Family Home Expected Discharge Date: 11/28/19                                     Social Determinants of Health (SDOH) Interventions    Readmission Risk Interventions Readmission Risk Prevention Plan 11/26/2019  Transportation Screening Complete  Medication Review [employer]) Complete  PCP or Specialist appointment within 3-5 days of discharge Complete  HRI or [place] Complete  SW Recovery Care/Counseling Consult Complete  [place] Not Applicable  Some recent data might be hidden

## 2019-11-28 NOTE — Discharge Summary (Signed)
Physician Discharge Summary  [name] TIR:[id number] DOB: 1940/01/26 DOA: 11/25/2019  PCP: [name], MD  Admit date: 11/25/2019 Discharge date: 11/28/2019  Admitted From: Home Disposition:  Home  Recommendations for Outpatient Follow-up:  1. Follow up with PCP in 1-2 weeks 2. She will go home with residential hospice.  Home Health:No Equipment/Devices:None  Discharge Condition:hospice CODE STATUS:DNR  Diet recommendation: Comfort feeds  Brief/Interim Summary:  79 y.o. female past medical history significant for CAD, renal cell carcinoma which has metastasized to the lung, with her last chemotherapy onJune of this year as she was unable to tolerate it severe aortic stenosis status post TAVR who presents to the hospital complaining of weakness and failure to thrive over the last 2 weeks prior to admission.  On admission the patient and family were interested in hospice and end-of-life care.  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 Hospice and palliative care was consulted hospice services.  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 She is not a candidate for chemotherapy.  CAD status post CABG/aortic stenosis: We have discontinued all  of her antihypertensive medications due to borderline low blood pressure.  Chronic thrombocytopenia: Remained stable.  Thyroidism: Continue Synthroid.  Depression: Continue SSRI.  Discharge Instructions  Discharge Instructions    Diet - low sodium heart healthy   Complete by: As directed    Increase activity slowly   Complete by: As directed      Allergies as of 11/28/2019      Reactions   Chantix [varenicline Tartrate] Hives   Dipyridamole Hives, Rash      Medication List    STOP taking these medications   amLODipine 10 MG tablet Commonly known as: NORVASC   aspirin EC 81 MG tablet   Inlyta 5 MG tablet Generic drug: axitinib   irbesartan 300 MG tablet Commonly known as: AVAPRO   magic mouthwash w/lidocaine Soln   potassium chloride 10 MEQ tablet Commonly known as: KLOR-CON   ProAir HFA 108 (90 Base) MCG/ACT inhaler Generic drug: albuterol   simvastatin 40 MG tablet Commonly known as: ZOCOR   spironolactone 25 MG tablet Commonly known as: ALDACTONE     TAKE these medications   acetaminophen 500 MG tablet Commonly known as: TYLENOL Take 1,000 mg by mouth See admin instructions. 1000mg  at bedtime And 1000mg  every 6 hours as needed for headache, pain, and fever   ALPRAZolam 0.25 MG tablet Commonly known as: XANAX Take 0.25 mg by mouth 3 (three) times daily as needed for anxiety.   AZO URINARY TRACT SUPPORT PO Take 1 tablet by mouth in the morning and at bedtime.   Bystolic 20 MG Tabs Generic drug: Nebivolol HCl Take 1 tablet (20 mg total) by mouth daily.   diclofenac Sodium 1 % Gel Commonly  known as: VOLTAREN Apply 4 g topically 3 (three) times daily as needed (Right knee pain).   diphenoxylate-atropine 2.5-0.025 MG tablet Commonly known as: Lomotil Take 1 tablet by mouth 4 (four) times daily as needed for diarrhea or loose stools.   FLUoxetine 40 MG capsule Commonly known as: PROZAC Take 40 mg by mouth every evening.   levothyroxine  100 MCG tablet Commonly known as: Synthroid Take 1 tablet (100 mcg total) by mouth daily before breakfast.   levothyroxine 125 MCG tablet Commonly known as: SYNTHROID Take 125 mcg by mouth daily.   multivitamin with minerals Tabs tablet Take 1 tablet by mouth daily.   nitroGLYCERIN 0.4 MG SL tablet Commonly known as: NITROSTAT Place 0.4 mg under the tongue every 5 (five) minutes as needed for chest pain (MAX 3 TABLETS).   nystatin powder Commonly known as: MYCOSTATIN/NYSTOP Apply 1 application topically 3 (three) times daily as needed (sweating under breast).   OVER THE COUNTER MEDICATION Take 1 tablet by mouth daily. Joint supplement   pantoprazole 40 MG tablet Commonly known as: PROTONIX TAKE 1 TABLET BY MOUTH EVERY DAY   prochlorperazine 10 MG tablet Commonly known as: COMPAZINE TAKE 1 TABLET(10 MG) BY MOUTH EVERY 6 HOURS AS NEEDED FOR NAUSEA OR VOMITING What changed: See the new instructions.   Symbicort 160-4.5 MCG/ACT inhaler Generic drug: budesonide-formoterol Inhale 1 puff into the lungs 2 (two) times daily as needed (for respiratory issues.).       Allergies  Allergen Reactions  . Chantix [Varenicline Tartrate] Hives  . Dipyridamole Hives and Rash    Consultations:  Palliative care   Procedures/Studies: CT ABDOMEN PELVIS WO CONTRAST  Result Date: 11/22/2019 CLINICAL DATA:  Renal cell carcinoma. Restaging. Assess treatment response. EXAM: CT CHEST, ABDOMEN AND PELVIS WITHOUT CONTRAST TECHNIQUE: Multidetector CT imaging of the chest, abdomen and pelvis was performed following the standard protocol without IV contrast. COMPARISON:  08/15/2019 FINDINGS: CT CHEST FINDINGS Cardiovascular: Normal heart size. Previous median sternotomy and CABG procedure. Aortic valve repair. Aortic atherosclerosis. No pericardial effusion. Mediastinum/Nodes: Normal appearance of the thyroid gland. The trachea appears patent and is midline. Normal appearance of the esophagus. No  supraclavicular or axillary adenopathy. Interval resolution of previous left pre-vascular adenopathy within the anterior mediastinum. One of 2 enlarged lymph nodes has resolved. Remaining pre-vascular node measures 0.4 cm, image 23/2. Previously 1.3 cm. Lungs/Pleura: Small bilateral pleural effusions identified right greater than left. Diffuse peripheral interstitial reticulation noted within the right lung with patchy areas of ground-glass attenuation in the right upper lobe. Bilateral lower lobe predominant interlobular septal thickening is identified concerning for interstitial edema. Subpleural nodular density in the posterior left lower lobe measures 7 mm, image 79/4. Previously 8 mm. Musculoskeletal: The bones appear diffusely osteopenic. Degenerative disc disease identified within the lower thoracic spine. Remote healed proximal body of sternum fracture. Signs of anasarca with skin thickening and subcutaneous fat stranding noted throughout the body wall. CT ABDOMEN PELVIS FINDINGS Hepatobiliary: No focal liver abnormality is seen. Status post cholecystectomy. No biliary dilatation. Pancreas: Unremarkable. No pancreatic ductal dilatation or surrounding inflammatory changes. Spleen: Normal in size without focal abnormality. Adrenals/Urinary Tract: Normal appearance of the adrenal glands. Status post right nephrectomy. No signs of left renal mass or hydronephrosis. The urinary bladder is unremarkable. Stomach/Bowel: Post operative changes from previous gastric bypass surgery. No bowel wall thickening, inflammation, or distension. Vascular/Lymphatic: Aortic atherosclerosis. Infrarenal abdominal aorta measures 3.3 cm in maximum diameter, image 72/2. No signs of abdominopelvic adenopathy. Reproductive: Uterus and bilateral adnexa  are unremarkable. Other: New small volume of ascites identified within the pelvis. No discrete fluid collections noted. Musculoskeletal: Mild superior endplate deformities involving L1 and  L4 are unchanged from previous exam. Lumbar degenerative disc disease and facet arthropathy noted. Diffuse body wall edema is noted compatible with anasarca. IMPRESSION: 1. Interval resolution of previous left pre-vascular lymph nodes. 2. Stable appearance of small subpleural nodule in the posterior left lower lobe. 3. No new or progressive findings identified within the chest, abdomen or pelvis. 4. Small bilateral pleural effusions, interstitial edema, small volume ascites, and diffuse body wall anasarca concerning for CHF. 5. 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 Recommend follow up by [place] in 3years. This recommendation follows ACR consensus guidelines: White Paper of the ACR Incidental Findings Committee II on Vascular Findings. Joellyn Rued [id number]; 62:[id number]. Aortic Atherosclerosis (ICD10-I70.0). 6.  Aortic Atherosclerosis (ICD10-I70.0). Electronically Signed   By: Kerby Moors M.D.   On: 11/22/2019 15:04   DG Chest 2 View  Result Date: 11/08/2019 CLINICAL DATA:  Weakness EXAM: CHEST - 2 VIEW COMPARISON:  06/02/2015 FINDINGS: Stable positioning of right-sided chest port. Prior median sternotomy and TAVR. Stable cardiomegaly. Atherosclerotic calcification of the aortic knob. Persistent elevation of the right hemidiaphragm. Mild patchy opacity within the periphery of the left lung base. No pleural effusion or pneumothorax. IMPRESSION: Mild patchy opacity within the periphery of the left lung base, which may represent scarring/atelectasis versus infiltrate. Electronically Signed   By: Davina Poke D.O.   On: 11/08/2019 14:11   CT HEAD WO CONTRAST  Result Date: 11/25/2019 CLINICAL DATA:  Generalized weakness for 2 weeks. Decreased oral intake. EXAM: CT HEAD WITHOUT CONTRAST TECHNIQUE: Contiguous axial images were obtained from the base of the skull through the vertex without intravenous contrast. COMPARISON:  06/02/2019 FINDINGS: Brain: No evidence of acute infarction,  hemorrhage, hydrocephalus, extra-axial collection or mass lesion/mass effect. Mild prominence of the sulci. Vascular: No hyperdense vessel or unexpected calcification. Skull: Normal. Negative for fracture or focal lesion. Sinuses/Orbits: No acute finding. Other: None IMPRESSION: 1. No acute intracranial abnormalities. 2. Mild brain atrophy. Electronically Signed   By: Kerby Moors M.D.   On: 11/25/2019 19:36   CT Chest Wo Contrast  Result Date: 11/22/2019 CLINICAL DATA:  Renal cell carcinoma. Restaging. Assess treatment response. EXAM: CT CHEST, ABDOMEN AND PELVIS WITHOUT CONTRAST TECHNIQUE: Multidetector CT imaging of the chest, abdomen and pelvis was performed following the standard protocol without IV contrast. COMPARISON:  08/15/2019 FINDINGS: CT CHEST FINDINGS Cardiovascular: Normal heart size. Previous median sternotomy and CABG procedure. Aortic valve repair. Aortic atherosclerosis. No pericardial effusion. Mediastinum/Nodes: Normal appearance of the thyroid gland. The trachea appears patent and is midline. Normal appearance of the esophagus. No supraclavicular or axillary adenopathy. Interval resolution of previous left pre-vascular adenopathy within the anterior mediastinum. One of 2 enlarged lymph nodes has resolved. Remaining pre-vascular node measures 0.4 cm, image 23/2. Previously 1.3 cm. Lungs/Pleura: Small bilateral pleural effusions identified right greater than left. Diffuse peripheral interstitial reticulation noted within the right lung with patchy areas of ground-glass attenuation in the right upper lobe. Bilateral lower lobe predominant interlobular septal thickening is identified concerning for interstitial edema. Subpleural nodular density in the posterior left lower lobe measures 7 mm, image 79/4. Previously 8 mm. Musculoskeletal: The bones appear diffusely osteopenic. Degenerative disc disease identified within the lower thoracic spine. Remote healed proximal body of sternum fracture.  Signs of anasarca with skin thickening and subcutaneous fat stranding noted throughout the body wall. CT ABDOMEN PELVIS  FINDINGS Hepatobiliary: No focal liver abnormality is seen. Status post cholecystectomy. No biliary dilatation. Pancreas: Unremarkable. No pancreatic ductal dilatation or surrounding inflammatory changes. Spleen: Normal in size without focal abnormality. Adrenals/Urinary Tract: Normal appearance of the adrenal glands. Status post right nephrectomy. No signs of left renal mass or hydronephrosis. The urinary bladder is unremarkable. Stomach/Bowel: Post operative changes from previous gastric bypass surgery. No bowel wall thickening, inflammation, or distension. Vascular/Lymphatic: Aortic atherosclerosis. Infrarenal abdominal aorta measures 3.3 cm in maximum diameter, image 72/2. No signs of abdominopelvic adenopathy. Reproductive: Uterus and bilateral adnexa are unremarkable. Other: New small volume of ascites identified within the pelvis. No discrete fluid collections noted. Musculoskeletal: Mild superior endplate deformities involving L1 and L4 are unchanged from previous exam. Lumbar degenerative disc disease and facet arthropathy noted. Diffuse body wall edema is noted compatible with anasarca. IMPRESSION: 1. Interval resolution of previous left pre-vascular lymph nodes. 2. Stable appearance of small subpleural nodule in the posterior left lower lobe. 3. No new or progressive findings identified within the chest, abdomen or pelvis. 4. Small bilateral pleural effusions, interstitial edema, small volume ascites, and diffuse body wall anasarca concerning for CHF. 5. 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 Recommend follow up by [place] in 3years. This recommendation follows ACR consensus guidelines: White Paper of the ACR Incidental Findings Committee II on Vascular Findings. Joellyn Rued [id number]; 81:[id number]. Aortic Atherosclerosis (ICD10-I70.0). 6.  Aortic Atherosclerosis  (ICD10-I70.0). Electronically Signed   By: Kerby Moors M.D.   On: 11/22/2019 15:04   VAS [place] LOWER EXTREMITY VENOUS (DVT) (ONLY [facility] & [facility] 7a-7p)  Result Date: 11/08/2019  Lower Venous DVTStudy Indications: Edema.  Risk Factors: None identified. Limitations: Body habitus and poor ultrasound/tissue interface. Comparison Study: No prior studies. Performing Technologist: Oliver Hum RVT  Examination Guidelines: A complete evaluation includes B-mode imaging, spectral Doppler, color Doppler, and power Doppler as needed of all accessible portions of each vessel. Bilateral testing is considered an integral part of a complete examination. Limited examinations for reoccurring indications may be performed as noted. The reflux portion of the exam is performed with the patient in reverse Trendelenburg.  +-----+---------------+---------+-----------+----------+--------------+ RIGHTCompressibilityPhasicitySpontaneityPropertiesThrombus Aging +-----+---------------+---------+-----------+----------+--------------+ CFV  Full           Yes      Yes                                 +-----+---------------+---------+-----------+----------+--------------+   +---------+---------------+---------+-----------+----------+--------------+ LEFT     CompressibilityPhasicitySpontaneityPropertiesThrombus Aging +---------+---------------+---------+-----------+----------+--------------+ CFV      Full           Yes      Yes                                 +---------+---------------+---------+-----------+----------+--------------+ SFJ      Full                                                        +---------+---------------+---------+-----------+----------+--------------+ FV Prox  Full                                                        +---------+---------------+---------+-----------+----------+--------------+  FV Mid   Full                                                         +---------+---------------+---------+-----------+----------+--------------+ FV Distal               Yes      Yes                                 +---------+---------------+---------+-----------+----------+--------------+ PFV      Full                                                        +---------+---------------+---------+-----------+----------+--------------+ POP      Full           Yes      Yes                                 +---------+---------------+---------+-----------+----------+--------------+ PTV      Full                                                        +---------+---------------+---------+-----------+----------+--------------+ PERO     Full                                                        +---------+---------------+---------+-----------+----------+--------------+     Summary: RIGHT: - No evidence of common femoral vein obstruction.  LEFT: - There is no evidence of deep vein thrombosis in the lower extremity. However, portions of this examination were limited- see technologist comments above.  - No cystic structure found in the popliteal fossa.  *See table(s) above for measurements and observations. Electronically signed by Deitra Mayo MD on 11/08/2019 at 4:27:33 PM.    Final      Subjective: Pain improved today.  Discharge Exam: Vitals:   11/27/19 2120 11/28/19 0436  BP: 108/68 (!) 116/56  Pulse: 78 65  Resp: 18 19  Temp: 97.6 F (36.4 C) 98.4 F (36.9 C)  SpO2: 95% 97%   Vitals:   11/27/19 0823 11/27/19 1338 11/27/19 2120 11/28/19 0436  BP:  124/75 108/68 (!) 116/56  Pulse:  71 78 65  Resp:  16 18 19   Temp:  98 F (36.7 C) 97.6 F (36.4 C) 98.4 F (36.9 C)  TempSrc:  Oral Oral Oral  SpO2: 98% 98% 95% 97%  Weight:      Height:        General: Pt is alert, awake, not in acute distress Cardiovascular: RRR, S1/S2 +, no rubs, no gallops Respiratory: CTA bilaterally, no wheezing, no rhonchi Abdominal: Soft, NT, ND,  bowel sounds + Extremities: no edema, no cyanosis    The results of significant  diagnostics from this hospitalization (including imaging, microbiology, ancillary and laboratory) are listed below for reference.     Microbiology: Recent Results (from the past 240 hour(s))  Respiratory Panel by RT PCR (Flu A&B, Covid) - Nasopharyngeal Swab     Status: None   Collection Time: 11/25/19 11:55 PM   Specimen: Nasopharyngeal Swab  Result Value Ref Range Status   SARS Coronavirus 2 by RT PCR NEGATIVE NEGATIVE Final    Comment: (NOTE) SARS-CoV-2 target nucleic acids are NOT DETECTED.  The SARS-CoV-2 RNA is generally detectable in upper respiratoy specimens during the acute phase of infection. The lowest concentration of SARS-CoV-2 viral copies this assay can detect is 131 copies/mL. A negative result does not preclude SARS-Cov-2 infection and should not be used as the sole basis for treatment or other patient management decisions. A negative result may occur with  improper specimen collection/handling, submission of specimen other than nasopharyngeal swab, presence of viral mutation(s) within the areas targeted by this assay, and inadequate number of viral copies (<131 copies/mL). A negative result must be combined with clinical observations, patient history, and epidemiological information. The expected result is Negative.  Fact Sheet for Patients:  PinkCheek.be  Fact Sheet for Healthcare Providers:  GravelBags.it  This test is no t yet approved or cleared by the [place] FDA and  has been authorized for detection and/or diagnosis of SARS-CoV-2 by FDA under an Emergency Use Authorization (EUA). This EUA will remain  in effect (meaning this test can be used) for the duration of the COVID-19 declaration under Section 564(b)(1) of the Act, 21 U.S.C. section 360bbb-3(b)(1), unless the authorization is terminated or revoked  sooner.     Influenza A by PCR NEGATIVE NEGATIVE Final   Influenza B by PCR NEGATIVE NEGATIVE Final    Comment: (NOTE) The Xpert Xpress SARS-CoV-2/FLU/RSV assay is intended as an aid in  the diagnosis of influenza from Nasopharyngeal swab specimens and  should not be used as a sole basis for treatment. Nasal washings and  aspirates are unacceptable for Xpert Xpress SARS-CoV-2/FLU/RSV  testing.  Fact Sheet for Patients: PinkCheek.be  Fact Sheet for Healthcare Providers: GravelBags.it  This test is not yet approved or cleared by the [place] FDA and  has been authorized for detection and/or diagnosis of SARS-CoV-2 by  FDA under an Emergency Use Authorization (EUA). This EUA will remain  in effect (meaning this test can be used) for the duration of the  Covid-19 declaration under Section 564(b)(1) of the Act, 21  U.S.C. section 360bbb-3(b)(1), unless the authorization is  terminated or revoked. Performed at [facility], [place] [address] [place], [place] [address]      Labs: BNP (last 3 results) Recent Labs    06/02/19 1929 11/25/19 1958 11/27/19 0417  BNP 159.1* 688.7* 673.4*   Basic Metabolic Panel: Recent Labs  Lab 11/22/19 1030 11/25/19 1958 11/26/19 0438 11/27/19 0417  NA 133* 135 135 136  K 3.8 3.6 3.5 3.3*  CL 105 107 107 106  CO2 21* 21* 21* 24  GLUCOSE 102* 90 92 99  BUN 11 15 14 17   CREATININE 1.13* 1.13* 1.09* 1.19*  CALCIUM 8.4* 8.6* 8.2* 8.3*   Liver Function Tests: Recent Labs  Lab 11/22/19 1030 11/25/19 1958  AST 25 25  ALT 37 38  ALKPHOS 66 58  BILITOT 1.0 1.4*  PROT 5.3* 4.7*  ALBUMIN 2.4* 2.1*   No results for input(s): LIPASE, AMYLASE in the last 168 hours. No results for input(s): AMMONIA  in the last 168 hours. CBC: Recent Labs  Lab 11/22/19 1030 11/25/19 1958 11/26/19 0438  WBC 8.6 9.1 7.1  NEUTROABS 5.6  --   --   HGB 12.5 11.6* 10.8*  HCT  37.4 34.4* 32.4*  MCV 101.6* 102.7* 105.2*  PLT 78* 69* 61*   Cardiac Enzymes: No results for input(s): CKTOTAL, CKMB, CKMBINDEX, TROPONINI in the last 168 hours. BNP: Invalid input(s): POCBNP CBG: No results for input(s): GLUCAP in the last 168 hours. D-Dimer No results for input(s): DDIMER in the last 72 hours. Hgb A1c No results for input(s): HGBA1C in the last 72 hours. Lipid Profile No results for input(s): CHOL, HDL, LDLCALC, TRIG, CHOLHDL, LDLDIRECT in the last 72 hours. Thyroid function studies No results for input(s): TSH, T4TOTAL, T3FREE, THYROIDAB in the last 72 hours.  Invalid input(s): FREET3 Anemia work up No results for input(s): VITAMINB12, FOLATE, FERRITIN, TIBC, IRON, RETICCTPCT in the last 72 hours. Urinalysis    Component Value Date/Time   COLORURINE STRAW (A) 11/08/2019 1419   APPEARANCEUR CLEAR 11/08/2019 1419   LABSPEC 1.003 (L) 11/08/2019 1419   PHURINE 6.0 11/08/2019 1419   GLUCOSEU NEGATIVE 11/08/2019 1419   HGBUR NEGATIVE 11/08/2019 1419   [place] 11/08/2019 1419   KETONESUR NEGATIVE 11/08/2019 1419   PROTEINUR NEGATIVE 11/08/2019 1419   UROBILINOGEN 0.2 10/24/2007 1052   NITRITE NEGATIVE 11/08/2019 1419   LEUKOCYTESUR MODERATE (A) 11/08/2019 1419   Sepsis Labs Invalid input(s): PROCALCITONIN,  WBC,  LACTICIDVEN Microbiology Recent Results (from the past 240 hour(s))  Respiratory Panel by RT PCR (Flu A&B, Covid) - Nasopharyngeal Swab     Status: None   Collection Time: 11/25/19 11:55 PM   Specimen: Nasopharyngeal Swab  Result Value Ref Range Status   SARS Coronavirus 2 by RT PCR NEGATIVE NEGATIVE Final    Comment: (NOTE) SARS-CoV-2 target nucleic acids are NOT DETECTED.  The SARS-CoV-2 RNA is generally detectable in upper respiratoy specimens during the acute phase of infection. The lowest concentration of SARS-CoV-2 viral copies this assay can detect is 131 copies/mL. A negative result does not preclude  SARS-Cov-2 infection and should not be used as the sole basis for treatment or other patient management decisions. A negative result may occur with  improper specimen collection/handling, submission of specimen other than nasopharyngeal swab, presence of viral mutation(s) within the areas targeted by this assay, and inadequate number of viral copies (<131 copies/mL). A negative result must be combined with clinical observations, patient history, and epidemiological information. The expected result is Negative.  Fact Sheet for Patients:  PinkCheek.be  Fact Sheet for Healthcare Providers:  GravelBags.it  This test is no t yet approved or cleared by the [place] FDA and  has been authorized for detection and/or diagnosis of SARS-CoV-2 by FDA under an Emergency Use Authorization (EUA). This EUA will remain  in effect (meaning this test can be used) for the duration of the COVID-19 declaration under Section 564(b)(1) of the Act, 21 U.S.C. section 360bbb-3(b)(1), unless the authorization is terminated or revoked sooner.     Influenza A by PCR NEGATIVE NEGATIVE Final   Influenza B by PCR NEGATIVE NEGATIVE Final    Comment: (NOTE) The Xpert Xpress SARS-CoV-2/FLU/RSV assay is intended as an aid in  the diagnosis of influenza from Nasopharyngeal swab specimens and  should not be used as a sole basis for treatment. Nasal washings and  aspirates are unacceptable for Xpert Xpress SARS-CoV-2/FLU/RSV  testing.  Fact Sheet for Patients: PinkCheek.be  Fact Sheet for Healthcare  Providers: GravelBags.it  This test is not yet approved or cleared by the [place] and  has been authorized for detection and/or diagnosis of SARS-CoV-2 by  FDA under an Emergency Use Authorization (EUA). This EUA will remain  in effect (meaning this test can be used) for the duration of the   Covid-19 declaration under Section 564(b)(1) of the Act, 21  U.S.C. section 360bbb-3(b)(1), unless the authorization is  terminated or revoked. Performed at [facility], [place] [address], [place], [place] [address]      Time coordinating discharge: Over 30 minutes  SIGNED:   Charlynne Cousins, MD  [facility] 11/28/2019, 7:19 AM Pager   If 7PM-7AM, please contact night-coverage [email or web address] Password [email or web address]

## 2019-12-24 ENCOUNTER — Other Ambulatory Visit: Payer: Self-pay | Admitting: Cardiology

## 2019-12-24 NOTE — Telephone Encounter (Signed)
It looks like she was admitted in November and this medication as well as a number of others were discontinued. I would not refill  Cristabel Bicknell [place] MD, [facility]

## 2019-12-25 ENCOUNTER — Telehealth: Payer: Self-pay

## 2019-12-25 NOTE — Telephone Encounter (Signed)
Called patient left message on personal voice mail Dr.Jordan advised do not refill Irbesartan.Advised to call back needs to schedule follow up appointment with Dr.Jordan.

## 2020-01-11 DEATH — deceased

## 2020-01-23 ENCOUNTER — Other Ambulatory Visit: Payer: Self-pay | Admitting: Gastroenterology

## 2020-02-24 ENCOUNTER — Other Ambulatory Visit: Payer: Self-pay

## 2020-08-20 ENCOUNTER — Encounter: Payer: Self-pay | Admitting: Oncology

## 2020-09-02 ENCOUNTER — Ambulatory Visit (HOSPITAL_COMMUNITY): Payer: Self-pay

## 2020-09-02 ENCOUNTER — Ambulatory Visit (HOSPITAL_COMMUNITY)
Admission: RE | Admit: 2020-09-02 | Payer: Self-pay | Source: Ambulatory Visit | Attending: Cardiology | Admitting: Cardiology
